# Patient Record
Sex: Male | Born: 1945
Health system: Southern US, Community
[De-identification: ages and names within clinical notes are randomized; demographics above are authoritative.]

## PROBLEM LIST (undated history)

## (undated) DIAGNOSIS — G20A1 Parkinson's disease without dyskinesia, without mention of fluctuations: Secondary | ICD-10-CM

## (undated) DIAGNOSIS — R112 Nausea with vomiting, unspecified: Secondary | ICD-10-CM

## (undated) DIAGNOSIS — J302 Other seasonal allergic rhinitis: Secondary | ICD-10-CM

## (undated) DIAGNOSIS — Z9889 Other specified postprocedural states: Secondary | ICD-10-CM

## (undated) DIAGNOSIS — M1990 Unspecified osteoarthritis, unspecified site: Secondary | ICD-10-CM

## (undated) DIAGNOSIS — K219 Gastro-esophageal reflux disease without esophagitis: Secondary | ICD-10-CM

## (undated) DIAGNOSIS — I517 Cardiomegaly: Secondary | ICD-10-CM

## (undated) DIAGNOSIS — I2510 Atherosclerotic heart disease of native coronary artery without angina pectoris: Secondary | ICD-10-CM

## (undated) DIAGNOSIS — G709 Myoneural disorder, unspecified: Secondary | ICD-10-CM

## (undated) DIAGNOSIS — R011 Cardiac murmur, unspecified: Secondary | ICD-10-CM

## (undated) DIAGNOSIS — E785 Hyperlipidemia, unspecified: Secondary | ICD-10-CM

## (undated) DIAGNOSIS — F32A Depression, unspecified: Secondary | ICD-10-CM

## (undated) DIAGNOSIS — I10 Essential (primary) hypertension: Secondary | ICD-10-CM

## (undated) DIAGNOSIS — Z952 Presence of prosthetic heart valve: Secondary | ICD-10-CM

## (undated) HISTORY — DX: Cardiomegaly: I51.7

## (undated) HISTORY — DX: Parkinson's disease without dyskinesia, without mention of fluctuations: G20.A1

## (undated) HISTORY — DX: Other seasonal allergic rhinitis: J30.2

## (undated) HISTORY — DX: Essential (primary) hypertension: I10

## (undated) HISTORY — PX: TONSILLECTOMY: SUR1361

## (undated) HISTORY — DX: Gastro-esophageal reflux disease without esophagitis: K21.9

## (undated) HISTORY — PX: APPENDECTOMY: SHX54

## (undated) HISTORY — PX: CARDIAC CATHETERIZATION: SHX172

## (undated) HISTORY — DX: Hyperlipidemia, unspecified: E78.5

## (undated) HISTORY — DX: Presence of prosthetic heart valve: Z95.2

## (undated) HISTORY — PX: HIATAL HERNIA REPAIR: SHX195

## (undated) HISTORY — PX: COLONOSCOPY: SHX174

---

## 2001-01-10 ENCOUNTER — Ambulatory Visit (HOSPITAL_COMMUNITY): Admission: RE | Admit: 2001-01-10 | Discharge: 2001-01-10 | Payer: Self-pay | Admitting: Gastroenterology

## 2001-01-10 ENCOUNTER — Encounter: Payer: Self-pay | Admitting: Gastroenterology

## 2001-02-01 ENCOUNTER — Ambulatory Visit (HOSPITAL_COMMUNITY): Admission: RE | Admit: 2001-02-01 | Discharge: 2001-02-01 | Payer: Self-pay | Admitting: Gastroenterology

## 2010-12-17 ENCOUNTER — Encounter: Payer: Self-pay | Admitting: Internal Medicine

## 2011-02-11 ENCOUNTER — Ambulatory Visit (AMBULATORY_SURGERY_CENTER): Payer: MEDICARE | Admitting: *Deleted

## 2011-02-11 VITALS — Ht 69.0 in | Wt 185.8 lb

## 2011-02-11 DIAGNOSIS — Z1211 Encounter for screening for malignant neoplasm of colon: Secondary | ICD-10-CM

## 2011-02-11 MED ORDER — PEG-KCL-NACL-NASULF-NA ASC-C 100 G PO SOLR: ORAL | Status: DC

## 2011-02-25 ENCOUNTER — Other Ambulatory Visit: Payer: Self-pay | Admitting: Internal Medicine

## 2011-03-10 ENCOUNTER — Telehealth: Payer: Self-pay | Admitting: *Deleted

## 2011-03-10 NOTE — Telephone Encounter (Signed)
Spoke with pt's wife to ask if pt can r/s his COLON from 11/05/04/10 to 04/21/11. We need the November appt for an pt who needs an earlier appt and his COLON is for screening. Wife states as far as she knows it's ok; she will call me back this pm and let me know.

## 2011-03-15 ENCOUNTER — Encounter: Payer: MEDICARE | Admitting: Internal Medicine

## 2011-03-17 NOTE — Telephone Encounter (Signed)
Closed; never got a return call.

## 2011-04-21 ENCOUNTER — Encounter: Payer: Self-pay | Admitting: Internal Medicine

## 2011-04-21 ENCOUNTER — Ambulatory Visit (AMBULATORY_SURGERY_CENTER): Payer: MEDICARE | Admitting: Internal Medicine

## 2011-04-21 VITALS — BP 137/77 | HR 65 | Temp 97.7°F | Resp 21 | Ht 69.0 in | Wt 185.0 lb

## 2011-04-21 DIAGNOSIS — Z1211 Encounter for screening for malignant neoplasm of colon: Secondary | ICD-10-CM

## 2011-04-21 MED ORDER — SODIUM CHLORIDE 0.9 % IV SOLN: 500.0000 mL | INTRAVENOUS | Status: DC

## 2011-04-21 NOTE — Op Note (Signed)
[facility] [address] [place], [place]  [address]  COLONOSCOPY PROCEDURE REPORT  PATIENT:  Drew Andrews, Drew Andrews  MR#:  [id number] BIRTHDATE:  03/27/1946, 65 yrs. old  GENDER:  male ENDOSCOPIST:  Carie Caddy. Mayrani Khamis, MD REF. BY:  Guerry Bruin, M.D. PROCEDURE DATE:  04/21/2011 PROCEDURE:  Colonoscopy 19147 ASA CLASS:  Class II INDICATIONS:  Routine Risk Screening MEDICATIONS:   MAC sedation, administered by CRNA, propofol (Diprivan) 280 mg IV  DESCRIPTION OF PROCEDURE:   After the risks benefits and alternatives of the procedure were thoroughly explained, informed consent was obtained.  Digital rectal exam was performed and revealed no rectal masses.   The LB CF-[id number] endoscope was introduced through the anus and advanced to the terminal ileum which was intubated for a short distance, without limitations. The quality of the prep was good, using MoviPrep.  The instrument was then slowly withdrawn as the colon was fully examined. <<PROCEDUREIMAGES>>  FINDINGS:  Normal terminal ileum.  Moderate diverticulosis was found DESCENDING & SIGMOID COLON. Small internal hemorrhoids were found.   Retroflexed views in the rectum revealed no other findings other than those already described.  The scope was then withdrawn from the cecum and the procedure completed.  COMPLICATIONS:  None  ENDOSCOPIC IMPRESSION: 1) Normal terminal ileum 2) Moderate diverticulosis in the DESCENDING & SIGMOID COLON 3) Internal hemorrhoids  RECOMMENDATIONS: 1) High fiber diet. 2) You should continue to follow colorectal cancer screening guidelines for "routine risk" patients with a repeat colonoscopy in 10 years. There is no need for FOBT (stool) testing for at least 5 years.  Carie Caddy. Rhea Belton, MD  CC:  Guerry Bruin, MD The Patient  n. eSIGNED:   Carie Caddy. Ivonna Kinnick at 04/21/2011 08:56 AM  Iverson Alamin, [phone]

## 2011-04-21 NOTE — Patient Instructions (Signed)
Please follow all discharge instructions given to you by the recovery room nurse. If you have any questions or problems after discharge please call [phone]. You will receive a phone call in the am to see how you are doing and answer any questions you may have. Thank you for choosing [facility] for your health care needs.  

## 2011-04-21 NOTE — Progress Notes (Signed)
Patient did not experience any of the following events: a burn prior to discharge; a fall within the facility; wrong site/side/patient/procedure/implant event; or a hospital transfer or hospital admission upon discharge from the facility. (G8907) Patient did not have preoperative order for IV antibiotic SSI prophylaxis. (G8918)  

## 2011-04-22 ENCOUNTER — Telehealth: Payer: Self-pay | Admitting: *Deleted

## 2011-04-22 NOTE — Telephone Encounter (Signed)

## 2012-04-16 ENCOUNTER — Other Ambulatory Visit (HOSPITAL_COMMUNITY): Payer: Self-pay | Admitting: Cardiovascular Disease

## 2012-04-16 DIAGNOSIS — I359 Nonrheumatic aortic valve disorder, unspecified: Secondary | ICD-10-CM

## 2012-07-05 ENCOUNTER — Ambulatory Visit (HOSPITAL_COMMUNITY)
Admission: RE | Admit: 2012-07-05 | Discharge: 2012-07-05 | Disposition: A | Discharge status: Home / Self Care | Payer: MEDICARE | Source: Ambulatory Visit | Attending: Cardiovascular Disease | Admitting: Cardiovascular Disease

## 2012-07-05 DIAGNOSIS — I359 Nonrheumatic aortic valve disorder, unspecified: Secondary | ICD-10-CM | POA: Insufficient documentation

## 2012-07-05 NOTE — Progress Notes (Signed)
[facility]   2D echo completed 07/05/2012.   Cindy Rigg, RDCS  

## 2013-02-14 ENCOUNTER — Other Ambulatory Visit (HOSPITAL_COMMUNITY): Payer: Self-pay | Admitting: Cardiovascular Disease

## 2013-02-14 DIAGNOSIS — I350 Nonrheumatic aortic (valve) stenosis: Secondary | ICD-10-CM

## 2013-03-04 ENCOUNTER — Ambulatory Visit (HOSPITAL_COMMUNITY)
Admission: RE | Admit: 2013-03-04 | Discharge: 2013-03-04 | Disposition: A | Discharge status: Home / Self Care | Payer: Medicare Other | Source: Ambulatory Visit | Attending: Cardiovascular Disease | Admitting: Cardiovascular Disease

## 2013-03-04 DIAGNOSIS — I350 Nonrheumatic aortic (valve) stenosis: Secondary | ICD-10-CM

## 2013-03-04 DIAGNOSIS — I359 Nonrheumatic aortic valve disorder, unspecified: Secondary | ICD-10-CM | POA: Insufficient documentation

## 2013-03-04 HISTORY — PX: TRANSTHORACIC ECHOCARDIOGRAM: SHX275

## 2013-03-04 NOTE — Progress Notes (Signed)
2D Echo Performed 05/14/2012    Irlanda Croghan, RCS  

## 2013-03-12 ENCOUNTER — Encounter (HOSPITAL_COMMUNITY): Payer: Self-pay | Admitting: Pharmacist

## 2013-03-12 ENCOUNTER — Encounter: Payer: Self-pay | Admitting: Cardiovascular Disease

## 2013-03-12 ENCOUNTER — Ambulatory Visit (INDEPENDENT_AMBULATORY_CARE_PROVIDER_SITE_OTHER): Payer: Medicare Other | Admitting: Cardiovascular Disease

## 2013-03-12 VITALS — BP 140/78 | HR 66 | Ht 69.5 in | Wt 185.6 lb

## 2013-03-12 DIAGNOSIS — I519 Heart disease, unspecified: Secondary | ICD-10-CM

## 2013-03-12 DIAGNOSIS — I350 Nonrheumatic aortic (valve) stenosis: Secondary | ICD-10-CM

## 2013-03-12 DIAGNOSIS — I5189 Other ill-defined heart diseases: Secondary | ICD-10-CM

## 2013-03-12 DIAGNOSIS — Z01818 Encounter for other preprocedural examination: Secondary | ICD-10-CM

## 2013-03-12 DIAGNOSIS — E785 Hyperlipidemia, unspecified: Secondary | ICD-10-CM

## 2013-03-12 DIAGNOSIS — I2510 Atherosclerotic heart disease of native coronary artery without angina pectoris: Secondary | ICD-10-CM

## 2013-03-12 DIAGNOSIS — I10 Essential (primary) hypertension: Secondary | ICD-10-CM

## 2013-03-12 DIAGNOSIS — I359 Nonrheumatic aortic valve disorder, unspecified: Secondary | ICD-10-CM

## 2013-03-12 DIAGNOSIS — D689 Coagulation defect, unspecified: Secondary | ICD-10-CM

## 2013-03-12 HISTORY — DX: Essential (primary) hypertension: I10

## 2013-03-12 HISTORY — DX: Nonrheumatic aortic (valve) stenosis: I35.0

## 2013-03-12 HISTORY — DX: Other ill-defined heart diseases: I51.89

## 2013-03-12 NOTE — Progress Notes (Signed)
Patient ID: Drew Andrews, male   DOB: November 28, 1945, 67 y.o.   MRN: [id number]     HPI: Drew Andrews is a 66 y.o. male who presents for cardiology followup evaluation regarding his aortic valve stenosis.  Mr. Drew Andrews now 67 years old. He was first told of having a heart murmur back in the 1980s when he was planning to work at it it is only from [employer] and had a physical exam done at that time. I have been seeing him since 2005 when he was referred to me by Dr. Kinnie Andrews for cardiac murmur. Since 2005, we have been following him fairly closely with frequent followup echo Doppler studies to assess his aortic valve. At that time, his aortic transvalvular gradient was 37 with a mean gradient of 18. Over the last 9 years, his aortic valve murmur has gradually become more significant such that last year in September 2013 he had a mean gradient of 41 with a peak instantaneous gradient of 73. At that time he remained entirely asymptomatic and was exercising 7 days per week and typically riding his bicycle anywhere from 40 minutes to an hour and a half without abnormalities. A subsequent Doppler study in March 2014 showed moderate concentric LVH with grade 2 diastolic dysfunction; Mean gradient was 40 and peak gradient 71 and calculated valve area was 0.9 cm. Over the years I have informed him of any symptoms that can occur as a result of this aortic stenosis. He recently had a six-month followup echo Doppler assessment on 03/04/2013. Ejection fraction again remained 55-60%. He did have grade 2 diastolic dysfunction. His aortic valve again was moderately calcified. The stenosis is felt to be severe with mild aortic regurgitation. On his most recent echo, mean gradient is now 47 and peak instantaneous gradient is 86 giving a calculated aortic valve area of 0.63 cm. He did have mitral annular calcification. His left atrium was moderately dilated. He did have very mild RV dilatation.  Upon  questioning, it does appear that over the past month Mr. Drew Andrews has continued to exercise. He has noticed however that when he walks up a steep hill for bikes up a hill at a fast pace he now has begun to notice a vague sense of upper chest tightness which immediately resolves with stopping what he does. He is unaware of any dyspnea. He denies any PND orthopnea. He is unaware of presyncope or syncope. He presents now for evaluation  Past Medical History  Diagnosis Date  . Seasonal allergies   . GERD (gastroesophageal reflux disease)   . LVH (left ventricular hypertrophy)     with aortic stenosis-bicuspid  . Hyperlipidemia   . Hypertension     Past Surgical History  Procedure Laterality Date  . Appendectomy    . Tonsillectomy    . Hiatal hernia repair    . Colonoscopy      Allergies  Allergen Reactions  . Scopolamine     seizure    Current Outpatient Prescriptions  Medication Sig Dispense Refill  . aspirin 81 MG tablet Take 81 mg by mouth daily.        . Ibuprofen-Diphenhydramine Cit (ADVIL PM PO) Take by mouth at bedtime.        Drew Andrews lisinopril (PRINIVIL,ZESTRIL) 5 MG tablet Take 5 mg by mouth daily.       Drew Andrews omeprazole (PRILOSEC OTC) 20 MG tablet Take 20 mg by mouth daily.        Drew Andrews VYTORIN 10-40 MG per tablet Take  1 tablet by mouth at bedtime.        No current facility-administered medications for this visit.    History   Social History  . Marital Status: Married    Spouse Name: N/A    Number of Children: N/A  . Years of Education: N/A   Occupational History  . Not on file.   Social History Main Topics  . Smoking status: Former Smoker    Quit date: 02/10/1974  . Smokeless tobacco: Not on file  . Alcohol Use: Yes     Comment: Drinks 1 bottle of wine daily and 1 beer  . Drug Use: No  . Sexual Activity: Not on file   Other Topics Concern  . Not on file   Social History Narrative  . No narrative on file   Additional social history is notable in that he has  a [employer] in [employer]. He is retired from [employer]. He quit tobacco in 1979. He continues to exercise 7 days per week.  Family History  Problem Relation Age of Onset  . Colon cancer Neg Hx   . Stomach cancer Neg Hx   . Heart attack Father     ROS is negative for fevers, chills or night sweats. He denies any changes in skin or turgor. There is no hair loss. He denies visual symptoms. He denies cough. There is no excess sputum production. He denies PND or orthopnea. There is no wheezing. There is no presyncope or syncope. He is unaware of palpitations. With significant activity he does note mild chest tightness particularly on inclines. He denies abdominal pain nausea vomiting or diarrhea. He denies change in bowel or bladder habits. He denies GU symptoms. He denies myalgias. He denies paresthesias. There is no known diabetes or endocrine problems. There is no thyroid abnormalities. He denies change in mental status. He denies sleep issues.  Other comprehensive 14 point system review is negative.  PE BP 140/78  Pulse 66  Ht 5' 9.5" (1.765 m)  Wt 185 lb 9.6 oz (84.188 kg)  BMI 27.02 kg/m2  Repeat blood pressure was 138/76. There was no orthostatic change. General: Alert, oriented, no distress.  Skin: normal turgor, no rashes HEENT: Normocephalic, atraumatic. Pupils round and reactive; sclera anicteric;no lid lag.  Nose without nasal septal hypertrophy Mouth/Parynx benign; Mallinpatti scale 2 Neck: No JVD; transmitted murmurs to his carotids bilaterally Lungs: clear to ausculatation and percussion; no wheezing or rales No chest wall tenderness to palpation Heart: RRR, s1 s2 normal 3/6 mid to late peaking harsh systolic murmur corn with aortic valve stenosis. Trivial  murmur of aortic insufficiency. Abdomen: soft, nontender; no hepatosplenomehaly, BS+; abdominal aorta nontender and not dilated by palpation. Pulses 2+ Femoral pulses 2+ without  bruits. Extremities: no clubbing cyanosis or edema, Homan's sign negative  Neurologic: grossly nonfocal; normal motor strength Psychologic: normal affect and mood.  ECG: Sinus rhythm at 66 beats per minute. First-degree AV block. LVH by voltage criteria. Mild nondiagnostic T changes.  LABS:  BMET No results found for this basename: na, k, cl, co2, glucose, bun, creatinine, calcium, gfrnonaa, gfraa     Hepatic Function Panel  No results found for this basename: prot, albumin, ast, alt, alkphos, bilitot, bilidir, ibili     CBC No results found for this basename: wbc, rbc, hgb, hct, plt, mcv, mch, mchc, rdw, neutrabs, lymphsabs, monoabs, eosabs, basosabs     BNP No results found for this basename: probnp    Lipid Panel  No  results found for this basename: chol, trig, hdl, cholhdl, vldl, ldlcalc     RADIOLOGY: No results found.    ASSESSMENT AND PLAN: I had a very long discussion with Mr.Cerda and his wife. I reviewed his echo Doppler data. I discussed with him in detail normal physiology with reference to cardiac anatomy and specifically with reference to his aortic valve and progressive aortic valve stenosis. On his most recent echo Doppler study, Mr. in toto which is echo reveals left ventricular hypertrophy with grade 2 diastolic dysfunction with severe aortic stenosis now with a valve area of 0.63 cm and a peak instantaneous gradient of 86 mm as well as a mean gradient of 47 mm. He is developed very mild symptoms of chest tightness particularly during significant exertion with his bike riding uphills and walking fast uphills. It is my strong recommendation that we pursue cardiac catheterization with the intent for aortic valve replacement surgery. I discussed with him in detail the risks benefits of the catheterization procedure. He is otherwise fairly healthy without significant comorbidities and I suspect would do well with the open aortic valve surgical approach using  a bovine aortic valve rather than a mechanical valve. I discussed risks benefits in detail of both procedures. After long discussion he be scheduled to undergo right and left heart catheterization later this week at [facility]. Laboratory will be checked prior to the procedure in the fasting state. Following his catheterization findings, I will then refer him for surgical consultation to be done as an outpatient for aortic valve replacement surgery.   Time spent: 45 minutes  Lennette Bihari, MD, [facility]  03/12/2013 1:56 PM

## 2013-03-12 NOTE — Patient Instructions (Signed)
Your physician has requested that you have a cardiac catheterization. Cardiac catheterization is used to diagnose and/or treat various heart conditions. Doctors may recommend this procedure for a number of different reasons. The most common reason is to evaluate chest pain. Chest pain can be a symptom of coronary artery disease (CAD), and cardiac catheterization can show whether plaque is narrowing or blocking your heart's arteries. This procedure is also used to evaluate the valves, as well as measure the blood flow and oxygen levels in different parts of your heart. For further information please visit [email or web address] Please follow instruction sheet, as given. This will be scheduled on Friday November 14th.  A chest x-ray takes a picture of the organs and structures inside the chest, including the heart, lungs, and blood vessels. This test can show several things, including, whether the heart is enlarges; whether fluid is building up in the lungs; and whether pacemaker / defibrillator leads are still in place.  Your physician recommends that you return for lab work for your procedure.

## 2013-03-13 ENCOUNTER — Ambulatory Visit: Payer: MEDICARE | Admitting: Cardiovascular Disease

## 2013-03-13 ENCOUNTER — Ambulatory Visit
Admission: RE | Admit: 2013-03-13 | Discharge: 2013-03-13 | Disposition: A | Discharge status: Home / Self Care | Payer: Medicare Other | Source: Ambulatory Visit | Attending: Cardiovascular Disease | Admitting: Cardiovascular Disease

## 2013-03-13 ENCOUNTER — Other Ambulatory Visit: Payer: Self-pay | Admitting: *Deleted

## 2013-03-13 DIAGNOSIS — E559 Vitamin D deficiency, unspecified: Secondary | ICD-10-CM

## 2013-03-13 DIAGNOSIS — Z01818 Encounter for other preprocedural examination: Secondary | ICD-10-CM

## 2013-03-13 DIAGNOSIS — D689 Coagulation defect, unspecified: Secondary | ICD-10-CM

## 2013-03-13 LAB — CBC
HCT: 39.2 % (ref 39.0–52.0)
MCH: 34.8 pg — ABNORMAL HIGH (ref 26.0–34.0)
MCHC: 34.2 g/dL (ref 30.0–36.0)
Platelets: 152 10*3/uL (ref 150–400)
RBC: 3.85 MIL/uL — ABNORMAL LOW (ref 4.22–5.81)
RDW: 13.8 % (ref 11.5–15.5)
WBC: 6.2 10*3/uL (ref 4.0–10.5)

## 2013-03-13 LAB — COMPREHENSIVE METABOLIC PANEL
ALT: 18 U/L (ref 0–53)
Albumin: 4.6 g/dL (ref 3.5–5.2)
CO2: 26 mEq/L (ref 19–32)
Calcium: 9.3 mg/dL (ref 8.4–10.5)
Chloride: 102 mEq/L (ref 96–112)
Potassium: 4.6 mEq/L (ref 3.5–5.3)
Sodium: 133 mEq/L — ABNORMAL LOW (ref 135–145)
Total Protein: 7 g/dL (ref 6.0–8.3)

## 2013-03-13 LAB — APTT: aPTT: 29 seconds (ref 24–37)

## 2013-03-13 LAB — LIPID PANEL
HDL: 63 mg/dL (ref >39)
LDL Cholesterol: 90 mg/dL (ref 0–99)

## 2013-03-13 LAB — PROTIME-INR: INR: 0.99 (ref <1.50)

## 2013-03-15 ENCOUNTER — Ambulatory Visit (HOSPITAL_COMMUNITY)
Admission: RE | Admit: 2013-03-15 | Discharge: 2013-03-15 | Disposition: A | Discharge status: Home / Self Care | Payer: Medicare Other | Source: Ambulatory Visit | Attending: Cardiovascular Disease | Admitting: Cardiovascular Disease

## 2013-03-15 ENCOUNTER — Encounter (HOSPITAL_COMMUNITY)
Admission: RE | Disposition: A | Discharge status: Home / Self Care | Payer: Self-pay | Source: Ambulatory Visit | Attending: Cardiovascular Disease

## 2013-03-15 DIAGNOSIS — I359 Nonrheumatic aortic valve disorder, unspecified: Secondary | ICD-10-CM

## 2013-03-15 DIAGNOSIS — I279 Pulmonary heart disease, unspecified: Secondary | ICD-10-CM

## 2013-03-15 DIAGNOSIS — I10 Essential (primary) hypertension: Secondary | ICD-10-CM | POA: Insufficient documentation

## 2013-03-15 DIAGNOSIS — Z87891 Personal history of nicotine dependence: Secondary | ICD-10-CM | POA: Diagnosis not present

## 2013-03-15 DIAGNOSIS — Z01818 Encounter for other preprocedural examination: Secondary | ICD-10-CM

## 2013-03-15 DIAGNOSIS — E785 Hyperlipidemia, unspecified: Secondary | ICD-10-CM | POA: Insufficient documentation

## 2013-03-15 DIAGNOSIS — I517 Cardiomegaly: Secondary | ICD-10-CM | POA: Diagnosis not present

## 2013-03-15 DIAGNOSIS — I2789 Other specified pulmonary heart diseases: Secondary | ICD-10-CM | POA: Insufficient documentation

## 2013-03-15 DIAGNOSIS — I519 Heart disease, unspecified: Secondary | ICD-10-CM

## 2013-03-15 DIAGNOSIS — I350 Nonrheumatic aortic (valve) stenosis: Secondary | ICD-10-CM

## 2013-03-15 DIAGNOSIS — K219 Gastro-esophageal reflux disease without esophagitis: Secondary | ICD-10-CM | POA: Insufficient documentation

## 2013-03-15 DIAGNOSIS — I5189 Other ill-defined heart diseases: Secondary | ICD-10-CM

## 2013-03-15 HISTORY — PX: LEFT HEART CATHETERIZATION WITH CORONARY ANGIOGRAM: SHX5451

## 2013-03-15 LAB — POCT I-STAT 3, ART BLOOD GAS (G3+)
Acid-base deficit: 2 mmol/L (ref 0.0–2.0)
Bicarbonate: 20.9 mEq/L (ref 20.0–24.0)
O2 Saturation: 97 %
pCO2 arterial: 30 mmHg — ABNORMAL LOW (ref 35.0–45.0)

## 2013-03-15 LAB — POCT I-STAT 3, VENOUS BLOOD GAS (G3P V)
Acid-base deficit: 3 mmol/L — ABNORMAL HIGH (ref 0.0–2.0)
Bicarbonate: 21.8 mEq/L (ref 20.0–24.0)
pCO2, Ven: 37.2 mmHg — ABNORMAL LOW (ref 45.0–50.0)
pO2, Ven: 36 mmHg (ref 30.0–45.0)

## 2013-03-15 SURGERY — LEFT HEART CATHETERIZATION WITH CORONARY ANGIOGRAM
Anesthesia: LOCAL

## 2013-03-15 MED ORDER — NITROGLYCERIN 0.2 MG/ML ON CALL CATH LAB
INTRAVENOUS | Status: AC
  Filled 2013-03-15: qty 1

## 2013-03-15 MED ORDER — ACETAMINOPHEN 325 MG PO TABS: 650.0000 mg | ORAL_TABLET | ORAL | Status: DC | PRN

## 2013-03-15 MED ORDER — HEPARIN (PORCINE) IN NACL 2-0.9 UNIT/ML-% IJ SOLN
INTRAMUSCULAR | Status: AC
  Filled 2013-03-15: qty 1000

## 2013-03-15 MED ORDER — MIDAZOLAM HCL 2 MG/2ML IJ SOLN
INTRAMUSCULAR | Status: AC
  Filled 2013-03-15: qty 2

## 2013-03-15 MED ORDER — DEXTROSE-NACL 5-0.45 % IV SOLN
INTRAVENOUS | Status: DC
  Administered 2013-03-15: 09:00:00 via INTRAVENOUS
  Filled 2013-03-15: qty 1000

## 2013-03-15 MED ORDER — ASPIRIN 81 MG PO CHEW
81.0000 mg | CHEWABLE_TABLET | ORAL | Status: AC
  Administered 2013-03-15: 81 mg via ORAL
  Filled 2013-03-15: qty 1

## 2013-03-15 MED ORDER — SODIUM CHLORIDE 0.9 % IV SOLN: 250.0000 mL | INTRAVENOUS | Status: DC | PRN

## 2013-03-15 MED ORDER — DIAZEPAM 5 MG PO TABS
5.0000 mg | ORAL_TABLET | ORAL | Status: AC
  Administered 2013-03-15: 5 mg via ORAL
  Filled 2013-03-15: qty 1

## 2013-03-15 MED ORDER — SODIUM CHLORIDE 0.9 % IJ SOLN: 3.0000 mL | Freq: Two times a day (BID) | INTRAMUSCULAR | Status: DC

## 2013-03-15 MED ORDER — SODIUM CHLORIDE 0.9 % IJ SOLN: 3.0000 mL | INTRAMUSCULAR | Status: DC | PRN

## 2013-03-15 MED ORDER — FENTANYL CITRATE 0.05 MG/ML IJ SOLN
INTRAMUSCULAR | Status: AC
  Filled 2013-03-15: qty 2

## 2013-03-15 MED ORDER — SODIUM CHLORIDE 0.9 % IV SOLN: INTRAVENOUS | Status: DC

## 2013-03-15 MED ORDER — LIDOCAINE HCL (PF) 1 % IJ SOLN
INTRAMUSCULAR | Status: AC
  Filled 2013-03-15: qty 30

## 2013-03-15 MED ORDER — ONDANSETRON HCL 4 MG/2ML IJ SOLN: 4.0000 mg | Freq: Four times a day (QID) | INTRAMUSCULAR | Status: DC | PRN

## 2013-03-15 NOTE — Interval H&P Note (Signed)
History and Physical Interval Note:  03/15/2013 8:29 AM  Drew Andrews  has presented today for R and L heart catheterization, with the diagnosis of Aortic Valve stenosis/chest pain.   The various methods of treatment have been discussed with the patient and family. After consideration of risks, benefits and other options for treatment, the patient has consented to  Procedure(s): LEFT HEART CATHETERIZATION WITH CORONARY ANGIOGRAM (N/A) as a surgical intervention .  The patient's history has been reviewed, patient examined, no change in status, stable for surgery.  I have reviewed the patient's chart and labs.  Questions were answered to the patient's satisfaction.     KELLY,THOMAS A

## 2013-03-15 NOTE — H&P (View-Only) (Signed)
Patient ID: Drew Andrews, male   DOB: 11/08/1945, 67 y.o.   MRN: [id number]     HPI: Drew Andrews is a 67 y.o. male who presents for cardiology followup evaluation regarding his aortic valve stenosis.  Mr. Drew Andrews now 67 years old. He was first told of having a heart murmur back in the 1980s when he was planning to work at it it is only from [employer] and had a physical exam done at that time. I have been seeing him since 2005 when he was referred to me by Dr. Medoff for cardiac murmur. Since 2005, we have been following him fairly closely with frequent followup echo Doppler studies to assess his aortic valve. At that time, his aortic transvalvular gradient was 37 with a mean gradient of 18. Over the last 9 years, his aortic valve murmur has gradually become more significant such that last year in September 2013 he had a mean gradient of 41 with a peak instantaneous gradient of 73. At that time he remained entirely asymptomatic and was exercising 7 days per week and typically riding his bicycle anywhere from 40 minutes to an hour and a half without abnormalities. A subsequent Doppler study in March 2014 showed moderate concentric LVH with grade 2 diastolic dysfunction; Mean gradient was 40 and peak gradient 71 and calculated valve area was 0.9 cm. Over the years I have informed him of any symptoms that can occur as a result of this aortic stenosis. He recently had a six-month followup echo Doppler assessment on 03/04/2013. Ejection fraction again remained 55-60%. He did have grade 2 diastolic dysfunction. His aortic valve again was moderately calcified. The stenosis is felt to be severe with mild aortic regurgitation. On his most recent echo, mean gradient is now 47 and peak instantaneous gradient is 86 giving a calculated aortic valve area of 0.63 cm. He did have mitral annular calcification. His left atrium was moderately dilated. He did have very mild RV dilatation.  Upon  questioning, it does appear that over the past month Drew Andrews has continued to exercise. He has noticed however that when he walks up a steep hill for bikes up a hill at a fast pace he now has begun to notice a vague sense of upper chest tightness which immediately resolves with stopping what he does. He is unaware of any dyspnea. He denies any PND orthopnea. He is unaware of presyncope or syncope. He presents now for evaluation  Past Medical History  Diagnosis Date  . Seasonal allergies   . GERD (gastroesophageal reflux disease)   . LVH (left ventricular hypertrophy)     with aortic stenosis-bicuspid  . Hyperlipidemia   . Hypertension     Past Surgical History  Procedure Laterality Date  . Appendectomy    . Tonsillectomy    . Hiatal hernia repair    . Colonoscopy      Allergies  Allergen Reactions  . Scopolamine     seizure    Current Outpatient Prescriptions  Medication Sig Dispense Refill  . aspirin 81 MG tablet Take 81 mg by mouth daily.        . Ibuprofen-Diphenhydramine Cit (ADVIL PM PO) Take by mouth at bedtime.        . lisinopril (PRINIVIL,ZESTRIL) 5 MG tablet Take 5 mg by mouth daily.       . omeprazole (PRILOSEC OTC) 20 MG tablet Take 20 mg by mouth daily.        . VYTORIN 10-40 MG per tablet Take   1 tablet by mouth at bedtime.        No current facility-administered medications for this visit.    History   Social History  . Marital Status: Married    Spouse Name: N/A    Number of Children: N/A  . Years of Education: N/A   Occupational History  . Not on file.   Social History Main Topics  . Smoking status: Former Smoker    Quit date: 02/10/1974  . Smokeless tobacco: Not on file  . Alcohol Use: Yes     Comment: Drinks 1 bottle of wine daily and 1 beer  . Drug Use: No  . Sexual Activity: Not on file   Other Topics Concern  . Not on file   Social History Narrative  . No narrative on file   Additional social history is notable in that he has  a bachelor of science in chemical engineering. He is retired from [employer]. He quit tobacco in 1979. He continues to exercise 7 days per week.  Family History  Problem Relation Age of Onset  . Colon cancer Neg Hx   . Stomach cancer Neg Hx   . Heart attack Father     ROS is negative for fevers, chills or night sweats. He denies any changes in skin or turgor. There is no hair loss. He denies visual symptoms. He denies cough. There is no excess sputum production. He denies PND or orthopnea. There is no wheezing. There is no presyncope or syncope. He is unaware of palpitations. With significant activity he does note mild chest tightness particularly on inclines. He denies abdominal pain nausea vomiting or diarrhea. He denies change in bowel or bladder habits. He denies GU symptoms. He denies myalgias. He denies paresthesias. There is no known diabetes or endocrine problems. There is no thyroid abnormalities. He denies change in mental status. He denies sleep issues.  Other comprehensive 14 point system review is negative.  PE BP 140/78  Pulse 66  Ht 5' 9.5" (1.765 m)  Wt 185 lb 9.6 oz (84.188 kg)  BMI 27.02 kg/m2  Repeat blood pressure was 138/76. There was no orthostatic change. General: Alert, oriented, no distress.  Skin: normal turgor, no rashes HEENT: Normocephalic, atraumatic. Pupils round and reactive; sclera anicteric;no lid lag.  Nose without nasal septal hypertrophy Mouth/Parynx benign; Mallinpatti scale 2 Neck: No JVD; transmitted murmurs to his carotids bilaterally Lungs: clear to ausculatation and percussion; no wheezing or rales No chest wall tenderness to palpation Heart: RRR, s1 s2 normal 3/6 mid to late peaking harsh systolic murmur corn with aortic valve stenosis. Trivial  murmur of aortic insufficiency. Abdomen: soft, nontender; no hepatosplenomehaly, BS+; abdominal aorta nontender and not dilated by palpation. Pulses 2+ Femoral pulses 2+ without  bruits. Extremities: no clubbing cyanosis or edema, Homan's sign negative  Neurologic: grossly nonfocal; normal motor strength Psychologic: normal affect and mood.  ECG: Sinus rhythm at 66 beats per minute. First-degree AV block. LVH by voltage criteria. Mild nondiagnostic T changes.  LABS:  BMET No results found for this basename: na, k, cl, co2, glucose, bun, creatinine, calcium, gfrnonaa, gfraa     Hepatic Function Panel  No results found for this basename: prot, albumin, ast, alt, alkphos, bilitot, bilidir, ibili     CBC No results found for this basename: wbc, rbc, hgb, hct, plt, mcv, mch, mchc, rdw, neutrabs, lymphsabs, monoabs, eosabs, basosabs     BNP No results found for this basename: probnp    Lipid Panel  No   results found for this basename: chol, trig, hdl, cholhdl, vldl, ldlcalc     RADIOLOGY: No results found.    ASSESSMENT AND PLAN: I had a very long discussion with Drew Andrews and his wife. I reviewed his echo Doppler data. I discussed with him in detail normal physiology with reference to cardiac anatomy and specifically with reference to his aortic valve and progressive aortic valve stenosis. On his most recent echo Doppler study, Mr. in toto which is echo reveals left ventricular hypertrophy with grade 2 diastolic dysfunction with severe aortic stenosis now with a valve area of 0.63 cm and a peak instantaneous gradient of 86 mm as well as a mean gradient of 47 mm. He is developed very mild symptoms of chest tightness particularly during significant exertion with his bike riding uphills and walking fast uphills. It is my strong recommendation that we pursue cardiac catheterization with the intent for aortic valve replacement surgery. I discussed with him in detail the risks benefits of the catheterization procedure. He is otherwise fairly healthy without significant comorbidities and I suspect would do well with the open aortic valve surgical approach using  a bovine aortic valve rather than a mechanical valve. I discussed risks benefits in detail of both procedures. After long discussion he be scheduled to undergo right and left heart catheterization later this week at [facility]. Laboratory will be checked prior to the procedure in the fasting state. Following his catheterization findings, I will then refer him for surgical consultation to be done as an outpatient for aortic valve replacement surgery.   Time spent: 45 minutes  Catera Hankins A. Mauri Temkin, MD, FACC  03/12/2013 1:56 PM    

## 2013-03-15 NOTE — CV Procedure (Addendum)
Drew Andrews is a 67 y.o. male    [id number]  [id number] LOCATION:  FACILITY: [facility]  PHYSICIAN: Lennette Bihari, MD, [facility] 02-11-1946   DATE OF PROCEDURE:  03/15/2013      CARDIAC CATHETERIZATION     HISTORY:  Mr. Drew Andrews is a 67 year old gentleman who has developed progressive aortic valve stenosis. He has remained essentially asymptomatic until approximately the past month when he has noticed a mild chest discomfort with significant activity. A recent echo Doppler study has demonstrated progression of his aortic stenosis. Ejection fraction was 55-60% , but he had grade 2 diastolic dysfunction. He had moderately severe aortic valve calcification with a mean transvalvular aortic gradient of 47 mm and a peak instantaneous gradient of 86 mm an aortic valve area of 0.63 cm. He had mitral annular calcification, moderate left atrial dilatation, and very mild RV dilatation. He now presents for right and left heart catheterization prior to need for aortic valve replacement surgery.   PROCEDURE:  The patient was brought to the second floor [facility]iac cath lab in the postabsorptive state. He was  premedicated with Versed 2 mg and fentanyl 50 mcg intravenously. He did receive an additional 1 mg Versed. His right femoral artery and right femoral vein were punctured and a 7 French venous sheath and 6 French arterial sheath were inserted. Swan-Ganz catheter was advanced in the venous sheath to the right atrium, right ventricle, pulmonary artery, pulmonary capillary wedge positions. Cardiac Was obtained by the thermodilution method. Oxygen saturation was obtained in the pulmonary artery and subsequently into the central aorta for Fick method cardiac output determination. The pigtail catheter was advanced into the central aorta. Simultaneous central aortic and pulmonary pressures were recorded. Initial attempt was made at crossing the aortic valve a straight wire but this was  unsuccessful. The pigtail catheter remains in the central aorta and supravalvular aortography was performed. The pigtail catheter was then removed and exchanged for a 5 French L4 diagnostic catheter and angiography was performed in the left coronary system. A JR 4 right catheter was then inserted for selective angiography into the right coronary artery. The right catheter was then used and a straight wire was inserted for further attempts at crossing the aortic valve. A Glidewire was also utilized, unsuccessfully. The catheter was then inserted and again the aortic valve was unable to be crossed. A subsequent attempt was made with the pigtail catheter successfully. At this point, decision was made to abort further attempts at crossing the aortic valve. All catheters were removed. Hemostasis was obtained by direct manual pressure. The patient tolerated the procedure well appearing  HEMODYNAMICS:   RA: 11/10  Mean 9 RV: 38/5/9 PA: 38/17 PCWP: a 20;  Mean 17  Oxygen Saturation:  PA: 68%                                  AO: 97%  Cardiac Output: 5.6l/min (Thermo); 5.0 (Fick)                Index:   2.8 l/min/m2;          2.5   ANGIOGRAPHY:  Fluoroscopy revealed severe calcification of his aortic valve.   Supravalvular aortography demonstrated markedly reduced aortic valve excursion with trivial aortic insufficiency.  1. Left main:  Angiographically normal short vessel which bifurcated into the left anterior descending artery and a large dominant left circumflex coronary artery 2. LAD: Mild  proximal coronary calcification. Mild 20% smooth narrowing proximally after the first diagonal vessel. The LAD wrapped around the LV apex and was otherwise free of significant disease. 3. Left circumflex: Angiographically normal dominant vessel 4. Right coronary artery: Angiographically normal nondominant right coronary artery   IMPRESSION:  Mild Pulmonary hypertension  No significant coronary objective  disease with evidence for mild coronary calcification of the LAD with smooth 20% narrowing but otherwise angiographically normal vessels with a dominant left circumflex system  Severely calcified aortic valve with reduced excursion and trivial aortic insufficiency  Echocardiographic documentation of severe aortic valve stenosis with a mean gradient of 47, a peak instantaneous gradient of 86, and a calculated aortic valve area by echocardiography of 0.63 cm  RECOMMENDATION:  Aortic valve replacement surgery.    Lennette Bihari, MD, [facility] 03/15/2013 10:43 AM

## 2013-03-20 ENCOUNTER — Encounter: Payer: Self-pay | Admitting: Surgery

## 2013-03-20 ENCOUNTER — Institutional Professional Consult (permissible substitution) (INDEPENDENT_AMBULATORY_CARE_PROVIDER_SITE_OTHER): Payer: Medicare Other | Admitting: Surgery

## 2013-03-20 VITALS — BP 159/88 | HR 66 | Resp 16 | Ht 69.5 in | Wt 180.0 lb

## 2013-03-20 DIAGNOSIS — I350 Nonrheumatic aortic (valve) stenosis: Secondary | ICD-10-CM

## 2013-03-20 DIAGNOSIS — I359 Nonrheumatic aortic valve disorder, unspecified: Secondary | ICD-10-CM

## 2013-03-21 ENCOUNTER — Encounter: Payer: Self-pay | Admitting: Surgery

## 2013-03-21 NOTE — Progress Notes (Signed)
PCP is Gaspar Garbe, MD Referring Provider is Lennette Bihari, MD  Chief Complaint  Patient presents with  . Coronary Artery Disease    eval for surgery...cathed 03/15/13...ECHO 03/04/13  . Aortic Stenosis    HPI:  The patient is a 67 year old active gentleman with a long history of a heart murmur dating back to the 1980's and know aortic stenosis that has been followed by Dr. Tresa Endo with serial echocardiograms. Over the past 9 years the aortic stenosis has progressed and in Sept. 2013 the mean gradient was 41 mm Hg. He remained asymptomatic, exercising hard 7 days per week. His most recent echo shows severe AS with a mean gradient of 47 mm Hg and a peak gradient of 86 mm Hg with an AVA of 0.63 cm2. Over the past month or so he reports some discomfort across his upper chest when riding his bike up a hill. This resolves with rest. He denies unusual dyspnea or dizziness. He has had no symptoms with activity on level ground. Cardiac cath shows no significant coronary disease.  Past Medical History  Diagnosis Date  . Seasonal allergies   . GERD (gastroesophageal reflux disease)   . LVH (left ventricular hypertrophy)     with aortic stenosis-bicuspid  . Hyperlipidemia   . Hypertension     Past Surgical History  Procedure Laterality Date  . Appendectomy    . Tonsillectomy    . Hiatal hernia repair    . Colonoscopy      X 2    Family History  Problem Relation Age of Onset  . Colon cancer Neg Hx   . Stomach cancer Neg Hx   . Heart attack Father     Social History History  Substance Use Topics  . Smoking status: Former Smoker -- 1.00 packs/day    Quit date: 02/10/1974  . Smokeless tobacco: Never Used  . Alcohol Use: Yes     Comment: Drinks 1 bottle of wine daily and 1 beer    Current Outpatient Prescriptions  Medication Sig Dispense Refill  . aspirin EC 81 MG tablet Take 81 mg by mouth daily.      . Ibuprofen-Diphenhydramine Cit (ADVIL PM PO) Take 2-3 tablets by  mouth at bedtime.       [name] lisinopril (PRINIVIL,ZESTRIL) 5 MG tablet Take 5 mg by mouth at bedtime.       [name] omeprazole (PRILOSEC OTC) 20 MG tablet Take 20 mg by mouth daily.       [name] VYTORIN 10-40 MG per tablet Take 1 tablet by mouth at bedtime.        No current facility-administered medications for this visit.    Allergies  Allergen Reactions  . Scopolamine     seizure    Review of Systems  Constitutional: Negative for activity change, appetite change, fatigue and unexpected weight change.  HENT: Negative.        Sees his dentist regularly  Eyes: Negative.   Respiratory: Negative.   Cardiovascular: Positive for chest pain.       Across upper chest only whle riding his bike fast up hill  Gastrointestinal: Negative.   Endocrine: Negative.   Genitourinary: Negative.   Musculoskeletal: Negative.   Skin: Negative.   Allergic/Immunologic: Negative.   Neurological: Negative.   Hematological: Negative.   Psychiatric/Behavioral: Negative.     BP 159/88  Pulse 66  Resp 16  Ht 5' 9.5" (1.765 m)  Wt 180 lb (81.647 kg)  BMI 26.21  kg/m2  SpO2 99% Physical Exam  Constitutional: He is oriented to person, place, and time. He appears well-developed and well-nourished. No distress.  HENT:  Head: Normocephalic and atraumatic.  Mouth/Throat: Oropharynx is clear and moist.  Eyes: Conjunctivae and EOM are normal. Pupils are equal, round, and reactive to light.  Neck: Normal range of motion. Neck supple. No JVD present. No thyromegaly present.  Cardiovascular: Normal rate, regular rhythm and intact distal pulses.   Murmur heard. 3/6 harsh crescendo/decrescendo murmur heard throughout the precordium.  Pulmonary/Chest: Effort normal and breath sounds normal. No respiratory distress. He has no rales.  Abdominal: Soft. Bowel sounds are normal. He exhibits no distension and no mass. There is no tenderness.  Musculoskeletal: Normal range of motion. He exhibits no edema.  Lymphadenopathy:     He has no cervical adenopathy.  Neurological: He is alert and oriented to person, place, and time. No cranial nerve deficit.  Skin: Skin is warm and dry.  Psychiatric: He has a normal mood and affect.     Diagnostic Tests:  *Cardiovascular Imaging at [facility] [address] [place], [place] [address] [phone]  ------------------------------------------------------------ Transthoracic Echocardiography  Patient: Drew Andrews, Drew Andrews MR #: [id number] Study Date: 03/04/2013 Gender: M Age: 68 Height: 177.8cm Weight: 81.6kg BSA: 48m^2 Pt. Status: Room:  ORDERING Arley Phenix ATTENDING Nanetta Batty, MD REFERRING Tisovec, Adelfa Koh SONOGRAPHER Clearence Ped, RCS PERFORMING [facility] cc:  ------------------------------------------------------------ LV EF: 55% - 60%  ------------------------------------------------------------ Indications: 424.1 Aortic valve disorders.  ------------------------------------------------------------ History: PMH: Hyperlipidemia Risk factors: Hypertension.  ------------------------------------------------------------ Study Conclusions  - Left ventricle: The cavity size was normal. There was mild concentric hypertrophy. Systolic function was normal. The estimated ejection fraction was in the range of 55% to 60%. Wall motion was normal; there were no regional wall motion abnormalities. Features are consistent with a pseudonormal left ventricular filling pattern, with concomitant abnormal relaxation and increased filling pressure (grade 2 diastolic dysfunction). - Aortic valve: Possibly bicuspid; moderately calcified leaflets. There was severe stenosis. Mild regurgitation. Valve area: 0.66cm^2(VTI). Valve area: 0.63cm^2 (Vmax). - Mitral valve: Calcified annulus. Valve area by continuity equation (using LVOT flow): 1.62cm^2. - Left atrium: The atrium was moderately dilated. - Right ventricle: The  cavity size was mildly dilated. Wall thickness was normal. - Atrial septum: No defect or patent foramen ovale was identified. Transthoracic echocardiography. M-mode, complete 2D, spectral Doppler, and color Doppler. Height: Height: 177.8cm. Height: 70in. Weight: Weight: 81.6kg. Weight: 179.6lb. Body mass index: BMI: 25.8kg/m^2. Body surface area: BSA: 76m^2. Blood pressure: 164/78. Patient status: Outpatient. Location: Echo laboratory.  ------------------------------------------------------------  ------------------------------------------------------------ Left ventricle: The cavity size was normal. There was mild concentric hypertrophy. Systolic function was normal. The estimated ejection fraction was in the range of 55% to 60%. Wall motion was normal; there were no regional wall motion abnormalities. The transmitral flow pattern was normal. The tissue Doppler parameters were abnormal. Features are consistent with a pseudonormal left ventricular filling pattern, with concomitant abnormal relaxation and increased filling pressure (grade 2 diastolic dysfunction).  ------------------------------------------------------------ Aortic valve: Possibly bicuspid; moderately calcified leaflets. Doppler: There was severe stenosis. Mild regurgitation. VTI ratio of LVOT to aortic valve: 0.21. Valve area: 0.66cm^2(VTI). Indexed valve area: 0.33cm^2/m^2 (VTI). Peak velocity ratio of LVOT to aortic valve: 0.2. Valve area: 0.63cm^2 (Vmax). Indexed valve area: 0.32cm^2/m^2 (Vmax). Mean gradient: 47mm Hg (S). Peak gradient: 86mm Hg (S).  ------------------------------------------------------------ Aorta: The aorta was normal, not dilated, and non-diseased.  ------------------------------------------------------------ Mitral valve: Calcified annulus. Doppler: Valve area by pressure half-time: 3.06cm^2.  Indexed valve area by pressure half-time: 1.53cm^2/m^2. Valve area by continuity equation  (using LVOT flow): 1.62cm^2. Indexed valve area by continuity equation (using LVOT flow): 0.81cm^2/m^2. Mean gradient: 3mm Hg (D). Peak gradient: 10mm Hg (D).  ------------------------------------------------------------ Left atrium: LA volume/ BSA = 35.5 ml/m2 The atrium was moderately dilated.  ------------------------------------------------------------ Atrial septum: No defect or patent foramen ovale was identified.  ------------------------------------------------------------ Right ventricle: The cavity size was mildly dilated. Wall thickness was normal. Systolic function was normal.  ------------------------------------------------------------ Pulmonic valve: The valve appears to be grossly normal. Doppler: Mild regurgitation.  ------------------------------------------------------------ Tricuspid valve: Structurally normal valve. Leaflet separation was normal. Doppler: Transvalvular velocity was within the normal range. Trivial regurgitation.  ------------------------------------------------------------ Right atrium: The atrium was normal in size.  ------------------------------------------------------------ Pericardium: There was no pericardial effusion.  ------------------------------------------------------------ Systemic veins: Inferior vena cava: The vessel was normal in size; the respirophasic diameter changes were in the normal range (= 50%); findings are consistent with normal central venous pressure. Diameter: 16mm.  ------------------------------------------------------------ Post procedure conclusions Ascending Aorta:  - The aorta was normal, not dilated, and non-diseased.  ------------------------------------------------------------  2D measurements Normal Doppler measurements Norma IVC l Diam 16 mm ------ Main pulmonary artery Left ventricle Pressure, 21 mm Hg =30 LVID ED, 48.5 mm 43-52 S chord, Left ventricle PLAX Ea, lat 6.5 cm/s ----- LVID  ES, 32.5 mm 23-38 ann, tiss chord, DP PLAX E/Ea, lat 18.62 ----- FS, chord, 33 % >29 ann, tiss PLAX DP LVPW, ED 14.2 mm ------ Ea, med 5.9 cm/s ----- IVS/LVPW 0.99 <1.3 ann, tiss ratio, ED DP Ventricular septum E/Ea, med 20.51 ----- IVS, ED 14.1 mm ------ ann, tiss LVOT DP Diam, S 20 mm ------ LVET 360 ms ----- Area 3.14 cm^2 ------ LVOT Aorta Peak vel, 94 cm/s ----- Root diam, 33 mm ------ S ED VTI, S 24.1 cm ----- Left atrium Aortic valve AP dim 47 mm ------ Peak vel, 464.53 cm/s ----- AP dim 2.35 cm/m^2 <2.2 S index Mean vel, 318.19 cm/s ----- Right ventricle S RVID ED, 41.5 mm 19-38 VTI, S 116.52 cm ----- PLAX Mean 47 mm Hg ----- gradient, S Peak 86 mm Hg ----- gradient, S VTI ratio 0.21 ----- LVOT/AV Area, VTI 0.66 cm^2 ----- Area 0.33 cm^2/m ----- index ^2 (VTI) Peak vel 0.2 ----- ratio, LVOT/AV Area, 0.63 cm^2 ----- Vmax Area 0.32 cm^2/m ----- index ^2 (Vmax) Regurg 499 ms ----- PHT Mitral valve Peak E 121 cm/s ----- vel Peak A 121 cm/s ----- vel Mean vel, 82.2 cm/s ----- D Decelerat 327 ms 150-2 ion time 30 Pressure 72 ms ----- half-time Mean 3 mm Hg ----- gradient, D Peak 10 mm Hg ----- gradient, D Peak E/A 1 ----- ratio Area 3.06 cm^2 ----- (PHT) Area 1.53 cm^2/m ----- index ^2 (PHT) Area 1.62 cm^2 ----- (LVOT) continuit y Area 0.81 cm^2/m ----- index ^2 (LVOT cont) Annulus 46.7 cm ----- VTI Tricuspid valve Regurg 203 cm/s ----- peak vel Peak 16 mm Hg ----- RV-RA gradient, S Systemic veins Estimated 5 mm Hg ----- CVP Right ventricle Pressure, 21 mm Hg <30 S Sa vel, 11.5 cm/s ----- lat ann, tiss DP  ------------------------------------------------------------ Prepared and Electronically Authenticated by  Croitoru, Mihai 2014-11-03T13:16:[id number]   CARDIAC CATH:  HEMODYNAMICS:  RA: 11/10 Mean 9  RV: 38/5/9  PA: 38/17  PCWP: a 20; Mean 17  Oxygen Saturation: PA: 68%  AO: 97%  Cardiac Output: 5.6l/min  (Thermo); 5.0 (Fick)  Index: 2.8 l/min/m2; 2.5  ANGIOGRAPHY:  Fluoroscopy revealed severe calcification of his aortic valve.  Supravalvular aortography demonstrated markedly reduced aortic valve excursion with trivial aortic insufficiency.  1. Left main: Angiographically normal short vessel which bifurcated into the left anterior descending artery and a large dominant left circumflex coronary artery  2. LAD: Mild proximal coronary calcification. Mild 20% smooth narrowing proximally after the first diagonal vessel. The LAD wrapped around the LV apex and was otherwise free of significant disease.  3. Left circumflex: Angiographically normal dominant vessel  4. Right coronary artery: Angiographically normal nondominant right coronary artery  IMPRESSION:  Mild Pulmonary hypertension  No significant coronary objective disease with evidence for mild coronary calcification of the LAD with smooth 20% narrowing but otherwise angiographically normal vessels with a dominant left circumflex system  Severely calcified aortic valve with reduced excursion and trivial aortic insufficiency  Echocardiographic documentation of severe aortic valve stenosis with a mean gradient of 47, a peak instantaneous gradient of 86, and a calculated aortic valve area by echocardiography of 0.63 cm    RECOMMENDATION:  Aortic valve replacement surgery.  Lennette Bihari, MD, [facility]  03/15/2013  10:43 AM        Impression:  He has severe, progressive aortic stenosis with no significant coronary disease that is becoming symptomatic with heavy physical activity like riding his bike up hills. I think AVR is indicated to allow him to safely return to full physical activity and to prevent progression of symptoms, LV deterioration, and sudden death. I discussed the pros and cons of mechanical and tissue valves with him and his wife. He wants a tissue valve so that he does not have to be on coumadin. At age 89 I think this is a  reasonable choice. He would like to wait until the first week in January to get through the holidays. He should be ok as long as he does not do any heavy exertional activity which is the only thing that has brought on activity. I discussed the operative procedure with the patient and his wife including alternatives, benefits and risks; including but not limited to bleeding, blood transfusion, infection, stroke, myocardial infarction, graft failure, heart block requiring a permanent pacemaker, organ dysfunction, and death.  Drew Andrews understands and agrees to proceed.  We will schedule surgery for the first week in January 2015.   Plan:  AVR using a tissue valve in January 2015.

## 2013-04-01 ENCOUNTER — Other Ambulatory Visit: Payer: Self-pay | Admitting: *Deleted

## 2013-04-01 DIAGNOSIS — I359 Nonrheumatic aortic valve disorder, unspecified: Secondary | ICD-10-CM

## 2013-04-15 ENCOUNTER — Other Ambulatory Visit: Payer: Self-pay | Admitting: *Deleted

## 2013-04-15 DIAGNOSIS — I359 Nonrheumatic aortic valve disorder, unspecified: Secondary | ICD-10-CM

## 2013-04-29 ENCOUNTER — Telehealth: Payer: Self-pay | Admitting: *Deleted

## 2013-04-29 ENCOUNTER — Encounter (HOSPITAL_COMMUNITY): Payer: Self-pay | Admitting: Pharmacy Technician

## 2013-04-29 NOTE — Telephone Encounter (Signed)
Returned call and pt verified x 2.  Pt c/o a rash halfway between his knee and crotch.  Stated at first he had bruising and then a few weeks later he noticed the rash.  Denied rash at cath site.  Stated the rash is about 3 inches or so and he wants to get it checked out before he goes to have his surgery.  Pt advised to see PCP for evaluation and Tx as his cath was over a month ago and the rash is likely not r/t that.  Pt verbalized understanding and agreed w/ plan.

## 2013-04-29 NOTE — Telephone Encounter (Signed)
Pt's wife is calling in regards to having an itchy patch on the leg where he had his cath. Cath was done November 14th.

## 2013-05-09 ENCOUNTER — Encounter (HOSPITAL_COMMUNITY)
Admission: RE | Admit: 2013-05-09 | Discharge: 2013-05-09 | Disposition: A | Discharge status: Home / Self Care | Payer: Medicare Other | Source: Ambulatory Visit | Attending: Surgery | Admitting: Surgery

## 2013-05-09 ENCOUNTER — Ambulatory Visit (HOSPITAL_COMMUNITY)
Admission: RE | Admit: 2013-05-09 | Discharge: 2013-05-09 | Disposition: A | Discharge status: Home / Self Care | Payer: Medicare Other | Source: Ambulatory Visit | Attending: Surgery | Admitting: Surgery

## 2013-05-09 ENCOUNTER — Encounter (HOSPITAL_COMMUNITY): Payer: Self-pay

## 2013-05-09 VITALS — BP 129/79 | HR 77 | Temp 98.7°F | Resp 16 | Ht 69.5 in | Wt 186.1 lb

## 2013-05-09 DIAGNOSIS — I359 Nonrheumatic aortic valve disorder, unspecified: Secondary | ICD-10-CM

## 2013-05-09 DIAGNOSIS — Z01810 Encounter for preprocedural cardiovascular examination: Secondary | ICD-10-CM

## 2013-05-09 HISTORY — DX: Nausea with vomiting, unspecified: R11.2

## 2013-05-09 HISTORY — DX: Nausea with vomiting, unspecified: Z98.890

## 2013-05-09 LAB — PULMONARY FUNCTION TEST
DL/VA % pred: 79 %
DL/VA: 3.61 ml/min/mmHg/L
DLCO cor % pred: 74 %
DLCO cor: 22.96 ml/min/mmHg
DLCO unc % pred: 74 %
DLCO unc: 22.96 ml/min/mmHg
FEF 25-75 PRE: 4.17 L/s
FEF2575-%PRED-PRE: 171 %
FEV1-%PRED-PRE: 113 %
FEV1-Pre: 3.6 L
FEV1FVC-%Pred-Pre: 111 %
FEV6-%Pred-Pre: 106 %
FEV6-PRE: 4.33 L
FEV6FVC-%Pred-Pre: 105 %
FVC-%PRED-PRE: 101 %
FVC-PRE: 4.35 L
PRE FEV6/FVC RATIO: 99 %
Pre FEV1/FVC ratio: 83 %
RV % pred: 67 %
RV: 1.6 L
TLC % PRED: 90 %
TLC: 6.17 L

## 2013-05-09 LAB — BLOOD GAS, ARTERIAL
ACID-BASE DEFICIT: 3.4 mmol/L — AB (ref 0.0–2.0)
Bicarbonate: 19.4 mEq/L — ABNORMAL LOW (ref 20.0–24.0)
Drawn by: 344381
O2 Saturation: 98.4 %
PO2 ART: 102 mmHg — AB (ref 80.0–100.0)
Patient temperature: 98.6
TCO2: 20.2 mmol/L (ref 0–100)
pCO2 arterial: 25.2 mmHg — ABNORMAL LOW (ref 35.0–45.0)
pH, Arterial: 7.498 — ABNORMAL HIGH (ref 7.350–7.450)

## 2013-05-09 LAB — PROTIME-INR
INR: 0.98 (ref 0.00–1.49)
PROTHROMBIN TIME: 12.8 s (ref 11.6–15.2)

## 2013-05-09 LAB — URINALYSIS, ROUTINE W REFLEX MICROSCOPIC
BILIRUBIN URINE: NEGATIVE
Glucose, UA: NEGATIVE mg/dL
Hgb urine dipstick: NEGATIVE
Ketones, ur: NEGATIVE mg/dL
Leukocytes, UA: NEGATIVE
NITRITE: NEGATIVE
PH: 5 (ref 5.0–8.0)
Protein, ur: NEGATIVE mg/dL
SPECIFIC GRAVITY, URINE: 1.011 (ref 1.005–1.030)
Urobilinogen, UA: 0.2 mg/dL (ref 0.0–1.0)

## 2013-05-09 LAB — COMPREHENSIVE METABOLIC PANEL
ALT: 33 U/L (ref 0–53)
AST: 24 U/L (ref 0–37)
Albumin: 4.2 g/dL (ref 3.5–5.2)
Alkaline Phosphatase: 43 U/L (ref 39–117)
BUN: 21 mg/dL (ref 6–23)
CO2: 16 meq/L — AB (ref 19–32)
CREATININE: 1 mg/dL (ref 0.50–1.35)
Calcium: 9.1 mg/dL (ref 8.4–10.5)
Chloride: 105 mEq/L (ref 96–112)
GFR, EST AFRICAN AMERICAN: 87 mL/min — AB (ref >90)
GFR, EST NON AFRICAN AMERICAN: 75 mL/min — AB (ref >90)
GLUCOSE: 144 mg/dL — AB (ref 70–99)
Potassium: 4.2 mEq/L (ref 3.7–5.3)
SODIUM: 139 meq/L (ref 137–147)
Total Bilirubin: 0.5 mg/dL (ref 0.3–1.2)
Total Protein: 7.4 g/dL (ref 6.0–8.3)

## 2013-05-09 LAB — APTT: aPTT: 23 seconds — ABNORMAL LOW (ref 24–37)

## 2013-05-09 LAB — CBC
HCT: 39.6 % (ref 39.0–52.0)
HEMOGLOBIN: 14.2 g/dL (ref 13.0–17.0)
MCH: 34.2 pg — AB (ref 26.0–34.0)
MCHC: 35.9 g/dL (ref 30.0–36.0)
MCV: 95.4 fL (ref 78.0–100.0)
Platelets: 135 10*3/uL — ABNORMAL LOW (ref 150–400)
RBC: 4.15 MIL/uL — AB (ref 4.22–5.81)
RDW: 11.9 % (ref 11.5–15.5)
WBC: 7.4 10*3/uL (ref 4.0–10.5)

## 2013-05-09 LAB — ABO/RH: ABO/RH(D): A POS

## 2013-05-09 LAB — SURGICAL PCR SCREEN
MRSA, PCR: NEGATIVE
STAPHYLOCOCCUS AUREUS: NEGATIVE

## 2013-05-09 LAB — TYPE AND SCREEN
ABO/RH(D): A POS
Antibody Screen: NEGATIVE

## 2013-05-09 LAB — HEMOGLOBIN A1C
Hgb A1c MFr Bld: 5.5 % (ref <5.7)
Mean Plasma Glucose: 111 mg/dL (ref <117)

## 2013-05-09 MED ORDER — CHLORHEXIDINE GLUCONATE 4 % EX LIQD: 30.0000 mL | CUTANEOUS | Status: DC

## 2013-05-09 NOTE — Pre-Procedure Instructions (Signed)
Drew AlaminMichael Andrews  05/09/2013   Your procedure is scheduled on:  Monday, Jan. 12  Report to [facility] Main Entrance "A" at 5:30 AM.  Call this number if you have problems the morning of surgery: [phone]   Remember:   Do not eat food or drink liquids after midnight.   Take these medicines the morning of surgery with A SIP OF WATER: prilosec              Stop non-steroidal anti-inflammatory medications  Do not wear jewelry, make-up or nail polish.  Do not wear lotions, powders, or perfumes. You may wear deodorant.  Do not shave 48 hours prior to surgery. Men may shave face and neck.  Do not bring valuables to the hospital.  [facility] is not responsible  for any belongings or valuables.               Contacts, dentures or bridgework may not be worn into surgery.  Leave suitcase in the car. After surgery it may be brought to your room.  For patients admitted to the hospital, discharge time is determined by your   treatment team.               Patients discharged the day of surgery will not be allowed to drive  home.  Name and phone number of your driver:   Special Instructions: Shower using CHG 2 nights before surgery and the night before surgery.  If you shower the day of surgery use CHG.  Use special wash - you have one bottle of CHG for all showers.  You should use approximately 1/3 of the bottle for each shower.   Please read over the following fact sheets that you were given: Pain Booklet, Coughing and Deep Breathing, Blood Transfusion Information, Open Heart Packet, MRSA Information and Surgical Site Infection Prevention

## 2013-05-09 NOTE — Progress Notes (Signed)
Pre-op Cardiac Surgery  Carotid Findings:  1-39% ICA stenosis.  Vertebral artery flow is antegrade.  Upper Extremity Right Left  Brachial Pressures 148T 149T  Radial Waveforms T T  Ulnar Waveforms T T  Palmar Arch (Allen's Test) WNL WNL   Findings:  WNL    Lower  Extremity Right Left  Dorsalis Pedis    Anterior Tibial    Posterior Tibial    Ankle/Brachial Indices      Findings:  palpable

## 2013-05-10 ENCOUNTER — Telehealth: Payer: Self-pay | Admitting: *Deleted

## 2013-05-10 NOTE — Telephone Encounter (Signed)
Patient came in today saying he thinks he was getting a head cold.  He denies fever, chills, productive cough.  I checked his temperature in the office and it was 97.7.  His labs from 1/8 pre op appt were normal.  CXR was normal.  I told him to call in over the weekend if he gets worse.  Dr. Laneta SimmersBartle made aware.

## 2013-05-12 ENCOUNTER — Encounter (HOSPITAL_COMMUNITY): Payer: Self-pay | Admitting: Certified Registered"

## 2013-05-12 MED ORDER — SODIUM CHLORIDE 0.9 % IV SOLN
1250.0000 mg | INTRAVENOUS | Status: AC
  Administered 2013-05-13: 1250 mg via INTRAVENOUS
  Filled 2013-05-12: qty 1250

## 2013-05-12 MED ORDER — MAGNESIUM SULFATE 50 % IJ SOLN
40.0000 meq | INTRAMUSCULAR | Status: DC
  Filled 2013-05-12: qty 10

## 2013-05-12 MED ORDER — POTASSIUM CHLORIDE 2 MEQ/ML IV SOLN
80.0000 meq | INTRAVENOUS | Status: DC
  Filled 2013-05-12: qty 40

## 2013-05-12 MED ORDER — HEPARIN SODIUM (PORCINE) 1000 UNIT/ML IJ SOLN
INTRAMUSCULAR | Status: DC
  Filled 2013-05-12: qty 30

## 2013-05-12 MED ORDER — PHENYLEPHRINE HCL 10 MG/ML IJ SOLN
30.0000 ug/min | INTRAVENOUS | Status: DC
  Filled 2013-05-12: qty 2

## 2013-05-12 MED ORDER — METOPROLOL TARTRATE 12.5 MG HALF TABLET
12.5000 mg | ORAL_TABLET | Freq: Once | ORAL | Status: AC
  Administered 2013-05-13: 12.5 mg via ORAL
  Filled 2013-05-12: qty 1

## 2013-05-12 MED ORDER — DEXTROSE 5 % IV SOLN
1.5000 g | INTRAVENOUS | Status: DC
  Filled 2013-05-12: qty 1.5

## 2013-05-12 MED ORDER — SODIUM CHLORIDE 0.9 % IV SOLN
INTRAVENOUS | Status: AC
  Administered 2013-05-13: 70 mL/h via INTRAVENOUS
  Filled 2013-05-12: qty 40

## 2013-05-12 MED ORDER — DEXTROSE 5 % IV SOLN
750.0000 mg | INTRAVENOUS | Status: DC
  Filled 2013-05-12: qty 750

## 2013-05-12 MED ORDER — DOPAMINE-DEXTROSE 3.2-5 MG/ML-% IV SOLN
2.0000 ug/kg/min | INTRAVENOUS | Status: DC
  Filled 2013-05-12: qty 250

## 2013-05-12 MED ORDER — EPINEPHRINE HCL 1 MG/ML IJ SOLN
0.5000 ug/min | INTRAVENOUS | Status: DC
  Filled 2013-05-12: qty 4

## 2013-05-12 MED ORDER — SODIUM CHLORIDE 0.9 % IV SOLN
INTRAVENOUS | Status: AC
  Administered 2013-05-13: 1 [IU]/h via INTRAVENOUS
  Filled 2013-05-12: qty 1

## 2013-05-12 MED ORDER — DEXMEDETOMIDINE HCL IN NACL 400 MCG/100ML IV SOLN
0.1000 ug/kg/h | INTRAVENOUS | Status: AC
  Administered 2013-05-13: 0.2 ug/kg/h via INTRAVENOUS
  Filled 2013-05-12: qty 100

## 2013-05-12 MED ORDER — PLASMA-LYTE 148 IV SOLN
INTRAVENOUS | Status: DC
  Filled 2013-05-12: qty 2.5

## 2013-05-12 MED ORDER — NITROGLYCERIN IN D5W 200-5 MCG/ML-% IV SOLN
2.0000 ug/min | INTRAVENOUS | Status: AC
  Administered 2013-05-13: 3 ug/min via INTRAVENOUS
  Filled 2013-05-12: qty 250

## 2013-05-13 ENCOUNTER — Inpatient Hospital Stay (HOSPITAL_COMMUNITY): Payer: Medicare Other

## 2013-05-13 ENCOUNTER — Encounter (HOSPITAL_COMMUNITY)
Admission: RE | Disposition: A | Discharge status: Home / Self Care | Payer: Medicare Other | Source: Ambulatory Visit | Attending: Surgery

## 2013-05-13 ENCOUNTER — Encounter (HOSPITAL_COMMUNITY): Payer: Self-pay | Admitting: *Deleted

## 2013-05-13 ENCOUNTER — Inpatient Hospital Stay (HOSPITAL_COMMUNITY): Payer: Medicare Other | Admitting: Certified Registered"

## 2013-05-13 ENCOUNTER — Encounter (HOSPITAL_COMMUNITY): Payer: Medicare Other | Admitting: Certified Registered"

## 2013-05-13 ENCOUNTER — Inpatient Hospital Stay (HOSPITAL_COMMUNITY)
Admission: RE | Admit: 2013-05-13 | Discharge: 2013-05-17 | DRG: 220 | Disposition: A | Discharge status: Home / Self Care | Payer: Medicare Other | Source: Ambulatory Visit | Attending: Surgery | Admitting: Surgery

## 2013-05-13 DIAGNOSIS — I359 Nonrheumatic aortic valve disorder, unspecified: Principal | ICD-10-CM | POA: Diagnosis present

## 2013-05-13 DIAGNOSIS — Z952 Presence of prosthetic heart valve: Secondary | ICD-10-CM

## 2013-05-13 DIAGNOSIS — D62 Acute posthemorrhagic anemia: Secondary | ICD-10-CM | POA: Diagnosis not present

## 2013-05-13 DIAGNOSIS — E785 Hyperlipidemia, unspecified: Secondary | ICD-10-CM | POA: Diagnosis present

## 2013-05-13 DIAGNOSIS — Z7982 Long term (current) use of aspirin: Secondary | ICD-10-CM

## 2013-05-13 DIAGNOSIS — I519 Heart disease, unspecified: Secondary | ICD-10-CM

## 2013-05-13 DIAGNOSIS — I10 Essential (primary) hypertension: Secondary | ICD-10-CM | POA: Diagnosis present

## 2013-05-13 DIAGNOSIS — I2789 Other specified pulmonary heart diseases: Secondary | ICD-10-CM | POA: Diagnosis present

## 2013-05-13 DIAGNOSIS — Z8249 Family history of ischemic heart disease and other diseases of the circulatory system: Secondary | ICD-10-CM

## 2013-05-13 DIAGNOSIS — Z87891 Personal history of nicotine dependence: Secondary | ICD-10-CM

## 2013-05-13 DIAGNOSIS — Z79899 Other long term (current) drug therapy: Secondary | ICD-10-CM

## 2013-05-13 DIAGNOSIS — J9819 Other pulmonary collapse: Secondary | ICD-10-CM | POA: Diagnosis not present

## 2013-05-13 DIAGNOSIS — E8779 Other fluid overload: Secondary | ICD-10-CM | POA: Diagnosis not present

## 2013-05-13 DIAGNOSIS — K219 Gastro-esophageal reflux disease without esophagitis: Secondary | ICD-10-CM | POA: Diagnosis present

## 2013-05-13 DIAGNOSIS — I2510 Atherosclerotic heart disease of native coronary artery without angina pectoris: Secondary | ICD-10-CM | POA: Diagnosis present

## 2013-05-13 DIAGNOSIS — I5189 Other ill-defined heart diseases: Secondary | ICD-10-CM

## 2013-05-13 DIAGNOSIS — D696 Thrombocytopenia, unspecified: Secondary | ICD-10-CM | POA: Diagnosis not present

## 2013-05-13 DIAGNOSIS — Z9089 Acquired absence of other organs: Secondary | ICD-10-CM

## 2013-05-13 HISTORY — PX: AORTIC VALVE REPLACEMENT: SHX41

## 2013-05-13 HISTORY — DX: Presence of prosthetic heart valve: Z95.2

## 2013-05-13 HISTORY — PX: INTRAOPERATIVE TRANSESOPHAGEAL ECHOCARDIOGRAM: SHX5062

## 2013-05-13 LAB — POCT I-STAT 4, (NA,K, GLUC, HGB,HCT)
GLUCOSE: 126 mg/dL — AB (ref 70–99)
Glucose, Bld: 115 mg/dL — ABNORMAL HIGH (ref 70–99)
Glucose, Bld: 134 mg/dL — ABNORMAL HIGH (ref 70–99)
Glucose, Bld: 143 mg/dL — ABNORMAL HIGH (ref 70–99)
Glucose, Bld: 123 mg/dL — ABNORMAL HIGH (ref 70–99)
Glucose, Bld: 109 mg/dL — ABNORMAL HIGH (ref 70–99)
HCT: 37 % — ABNORMAL LOW (ref 39.0–52.0)
HCT: 30 % — ABNORMAL LOW (ref 39.0–52.0)
HCT: 31 % — ABNORMAL LOW (ref 39.0–52.0)
HEMATOCRIT: 33 % — AB (ref 39.0–52.0)
HEMATOCRIT: 27 % — AB (ref 39.0–52.0)
HEMATOCRIT: 33 % — AB (ref 39.0–52.0)
HEMOGLOBIN: 11.2 g/dL — AB (ref 13.0–17.0)
Hemoglobin: 12.6 g/dL — ABNORMAL LOW (ref 13.0–17.0)
Hemoglobin: 10.2 g/dL — ABNORMAL LOW (ref 13.0–17.0)
Hemoglobin: 9.2 g/dL — ABNORMAL LOW (ref 13.0–17.0)
Hemoglobin: 10.5 g/dL — ABNORMAL LOW (ref 13.0–17.0)
Hemoglobin: 11.2 g/dL — ABNORMAL LOW (ref 13.0–17.0)
POTASSIUM: 4 meq/L (ref 3.7–5.3)
POTASSIUM: 5.2 meq/L (ref 3.7–5.3)
POTASSIUM: 5.1 meq/L (ref 3.7–5.3)
Potassium: 3.9 mEq/L (ref 3.7–5.3)
Potassium: 5.7 mEq/L — ABNORMAL HIGH (ref 3.7–5.3)
Potassium: 4.7 mEq/L (ref 3.7–5.3)
SODIUM: 142 meq/L (ref 137–147)
SODIUM: 138 meq/L (ref 137–147)
SODIUM: 139 meq/L (ref 137–147)
Sodium: 140 mEq/L (ref 137–147)
Sodium: 137 mEq/L (ref 137–147)
Sodium: 137 mEq/L (ref 137–147)

## 2013-05-13 LAB — POCT I-STAT 3, ART BLOOD GAS (G3+)
ACID-BASE DEFICIT: 4 mmol/L — AB (ref 0.0–2.0)
ACID-BASE DEFICIT: 6 mmol/L — AB (ref 0.0–2.0)
ACID-BASE DEFICIT: 3 mmol/L — AB (ref 0.0–2.0)
Acid-base deficit: 4 mmol/L — ABNORMAL HIGH (ref 0.0–2.0)
Acid-base deficit: 3 mmol/L — ABNORMAL HIGH (ref 0.0–2.0)
BICARBONATE: 22.8 meq/L (ref 20.0–24.0)
BICARBONATE: 21.6 meq/L (ref 20.0–24.0)
Bicarbonate: 22.3 mEq/L (ref 20.0–24.0)
Bicarbonate: 19.7 mEq/L — ABNORMAL LOW (ref 20.0–24.0)
Bicarbonate: 21.8 mEq/L (ref 20.0–24.0)
O2 SAT: 98 %
O2 Saturation: 100 %
O2 Saturation: 100 %
O2 Saturation: 94 %
O2 Saturation: 95 %
PCO2 ART: 40 mmHg (ref 35.0–45.0)
PCO2 ART: 38.7 mmHg (ref 35.0–45.0)
PCO2 ART: 39.5 mmHg (ref 35.0–45.0)
PH ART: 7.342 — AB (ref 7.350–7.450)
PH ART: 7.366 (ref 7.350–7.450)
PO2 ART: 338 mmHg — AB (ref 80.0–100.0)
PO2 ART: 77 mmHg — AB (ref 80.0–100.0)
PO2 ART: 81 mmHg (ref 80.0–100.0)
Patient temperature: 37.2
Patient temperature: 37.7
TCO2: 24 mmol/L (ref 0–100)
TCO2: 24 mmol/L (ref 0–100)
TCO2: 21 mmol/L (ref 0–100)
TCO2: 23 mmol/L (ref 0–100)
TCO2: 23 mmol/L (ref 0–100)
pCO2 arterial: 47.9 mmHg — ABNORMAL HIGH (ref 35.0–45.0)
pCO2 arterial: 38.2 mmHg (ref 35.0–45.0)
pH, Arterial: 7.286 — ABNORMAL LOW (ref 7.350–7.450)
pH, Arterial: 7.314 — ABNORMAL LOW (ref 7.350–7.450)
pH, Arterial: 7.35 (ref 7.350–7.450)
pO2, Arterial: 210 mmHg — ABNORMAL HIGH (ref 80.0–100.0)
pO2, Arterial: 117 mmHg — ABNORMAL HIGH (ref 80.0–100.0)

## 2013-05-13 LAB — CREATININE, SERUM
CREATININE: 1.01 mg/dL (ref 0.50–1.35)
GFR calc Af Amer: 86 mL/min — ABNORMAL LOW (ref >90)
GFR, EST NON AFRICAN AMERICAN: 74 mL/min — AB (ref >90)

## 2013-05-13 LAB — CBC
HCT: 30.4 % — ABNORMAL LOW (ref 39.0–52.0)
HEMATOCRIT: 31.1 % — AB (ref 39.0–52.0)
HEMOGLOBIN: 11.2 g/dL — AB (ref 13.0–17.0)
HEMOGLOBIN: 10.8 g/dL — AB (ref 13.0–17.0)
MCH: 34.1 pg — ABNORMAL HIGH (ref 26.0–34.0)
MCH: 33.5 pg (ref 26.0–34.0)
MCHC: 36 g/dL (ref 30.0–36.0)
MCHC: 35.5 g/dL (ref 30.0–36.0)
MCV: 94.8 fL (ref 78.0–100.0)
MCV: 94.4 fL (ref 78.0–100.0)
Platelets: 102 10*3/uL — ABNORMAL LOW (ref 150–400)
Platelets: 93 10*3/uL — ABNORMAL LOW (ref 150–400)
RBC: 3.28 MIL/uL — ABNORMAL LOW (ref 4.22–5.81)
RBC: 3.22 MIL/uL — ABNORMAL LOW (ref 4.22–5.81)
RDW: 11.8 % (ref 11.5–15.5)
RDW: 11.8 % (ref 11.5–15.5)
WBC: 15.7 10*3/uL — AB (ref 4.0–10.5)
WBC: 14.2 10*3/uL — AB (ref 4.0–10.5)

## 2013-05-13 LAB — POCT I-STAT 3, VENOUS BLOOD GAS (G3P V)
Acid-base deficit: 6 mmol/L — ABNORMAL HIGH (ref 0.0–2.0)
Bicarbonate: 21 mEq/L (ref 20.0–24.0)
O2 SAT: 78 %
PCO2 VEN: 41.1 mmHg — AB (ref 45.0–50.0)
PH VEN: 7.304 — AB (ref 7.250–7.300)
PO2 VEN: 41 mmHg (ref 30.0–45.0)
Patient temperature: 34.5
TCO2: 22 mmol/L (ref 0–100)

## 2013-05-13 LAB — PROTIME-INR
INR: 1.41 (ref 0.00–1.49)
Prothrombin Time: 16.9 seconds — ABNORMAL HIGH (ref 11.6–15.2)

## 2013-05-13 LAB — POCT I-STAT, CHEM 8
BUN: 15 mg/dL (ref 6–23)
Calcium, Ion: 1.18 mmol/L (ref 1.13–1.30)
Chloride: 108 mEq/L (ref 96–112)
Creatinine, Ser: 1 mg/dL (ref 0.50–1.35)
Glucose, Bld: 129 mg/dL — ABNORMAL HIGH (ref 70–99)
HCT: 32 % — ABNORMAL LOW (ref 39.0–52.0)
HEMOGLOBIN: 10.9 g/dL — AB (ref 13.0–17.0)
Potassium: 4.1 mEq/L (ref 3.7–5.3)
SODIUM: 143 meq/L (ref 137–147)
TCO2: 21 mmol/L (ref 0–100)

## 2013-05-13 LAB — MAGNESIUM: Magnesium: 2.9 mg/dL — ABNORMAL HIGH (ref 1.5–2.5)

## 2013-05-13 LAB — PLATELET COUNT: Platelets: 101 10*3/uL — ABNORMAL LOW (ref 150–400)

## 2013-05-13 LAB — HEMOGLOBIN AND HEMATOCRIT, BLOOD
HCT: 27.5 % — ABNORMAL LOW (ref 39.0–52.0)
Hemoglobin: 10 g/dL — ABNORMAL LOW (ref 13.0–17.0)

## 2013-05-13 LAB — APTT: aPTT: 35 seconds (ref 24–37)

## 2013-05-13 SURGERY — REPLACEMENT, AORTIC VALVE, OPEN
Anesthesia: General | Site: Chest

## 2013-05-13 MED ORDER — HEPARIN SODIUM (PORCINE) 1000 UNIT/ML IJ SOLN
INTRAMUSCULAR | Status: DC | PRN
  Administered 2013-05-13: 32000 [IU] via INTRAVENOUS

## 2013-05-13 MED ORDER — SODIUM CHLORIDE 0.9 % IV SOLN
INTRAVENOUS | Status: DC
  Administered 2013-05-13 – 2013-05-14 (×2): 1.5 [IU]/h via INTRAVENOUS
  Filled 2013-05-13 (×2): qty 1

## 2013-05-13 MED ORDER — MORPHINE SULFATE 2 MG/ML IJ SOLN
1.0000 mg | INTRAMUSCULAR | Status: AC | PRN
  Administered 2013-05-13 (×2): 1 mg via INTRAVENOUS
  Administered 2013-05-13: 2 mg via INTRAVENOUS
  Filled 2013-05-13 (×4): qty 1

## 2013-05-13 MED ORDER — MORPHINE SULFATE 2 MG/ML IJ SOLN
2.0000 mg | INTRAMUSCULAR | Status: DC | PRN
  Administered 2013-05-14: 4 mg via INTRAVENOUS
  Administered 2013-05-14: 2 mg via INTRAVENOUS
  Administered 2013-05-14: 4 mg via INTRAVENOUS
  Filled 2013-05-13: qty 2
  Filled 2013-05-13: qty 1
  Filled 2013-05-13: qty 2

## 2013-05-13 MED ORDER — SODIUM CHLORIDE 0.45 % IV SOLN
INTRAVENOUS | Status: DC
  Administered 2013-05-13: 20 mL/h via INTRAVENOUS

## 2013-05-13 MED ORDER — METOPROLOL TARTRATE 1 MG/ML IV SOLN: 2.5000 mg | INTRAVENOUS | Status: DC | PRN

## 2013-05-13 MED ORDER — ONDANSETRON HCL 4 MG/2ML IJ SOLN
4.0000 mg | Freq: Four times a day (QID) | INTRAMUSCULAR | Status: DC | PRN
  Administered 2013-05-13: 4 mg via INTRAVENOUS
  Filled 2013-05-13: qty 2

## 2013-05-13 MED ORDER — FENTANYL CITRATE 0.05 MG/ML IJ SOLN
INTRAMUSCULAR | Status: DC | PRN
  Administered 2013-05-13: 150 ug via INTRAVENOUS
  Administered 2013-05-13: 50 ug via INTRAVENOUS
  Administered 2013-05-13: 100 ug via INTRAVENOUS
  Administered 2013-05-13: 50 ug via INTRAVENOUS
  Administered 2013-05-13: 100 ug via INTRAVENOUS
  Administered 2013-05-13: 50 ug via INTRAVENOUS
  Administered 2013-05-13: 100 ug via INTRAVENOUS
  Administered 2013-05-13: 250 ug via INTRAVENOUS
  Administered 2013-05-13 (×2): 150 ug via INTRAVENOUS
  Administered 2013-05-13: 100 ug via INTRAVENOUS
  Administered 2013-05-13 (×2): 50 ug via INTRAVENOUS
  Administered 2013-05-13: 100 ug via INTRAVENOUS

## 2013-05-13 MED ORDER — DEXTROSE 5 % IV SOLN
1.5000 g | INTRAVENOUS | Status: DC | PRN
  Administered 2013-05-13: .75 g via INTRAVENOUS
  Administered 2013-05-13: 1.5 g via INTRAVENOUS

## 2013-05-13 MED ORDER — THROMBIN 20000 UNITS EX SOLR
OROMUCOSAL | Status: DC | PRN
  Administered 2013-05-13 (×3): via TOPICAL

## 2013-05-13 MED ORDER — MAGNESIUM SULFATE 40 MG/ML IJ SOLN
4.0000 g | Freq: Once | INTRAMUSCULAR | Status: AC
  Administered 2013-05-13: 4 g via INTRAVENOUS
  Filled 2013-05-13: qty 100

## 2013-05-13 MED ORDER — METOPROLOL TARTRATE 12.5 MG HALF TABLET
12.5000 mg | ORAL_TABLET | Freq: Two times a day (BID) | ORAL | Status: DC
  Administered 2013-05-14: 12.5 mg via ORAL
  Filled 2013-05-13 (×3): qty 1

## 2013-05-13 MED ORDER — VANCOMYCIN HCL IN DEXTROSE 1-5 GM/200ML-% IV SOLN
1000.0000 mg | Freq: Once | INTRAVENOUS | Status: AC
  Administered 2013-05-13: 1000 mg via INTRAVENOUS
  Filled 2013-05-13: qty 200

## 2013-05-13 MED ORDER — BISACODYL 5 MG PO TBEC
10.0000 mg | DELAYED_RELEASE_TABLET | Freq: Every day | ORAL | Status: DC
  Administered 2013-05-14: 10 mg via ORAL
  Filled 2013-05-13: qty 2

## 2013-05-13 MED ORDER — ACETAMINOPHEN 650 MG RE SUPP
650.0000 mg | Freq: Once | RECTAL | Status: AC
  Administered 2013-05-13: 650 mg via RECTAL

## 2013-05-13 MED ORDER — NITROGLYCERIN IN D5W 200-5 MCG/ML-% IV SOLN
0.0000 ug/min | INTRAVENOUS | Status: DC
  Administered 2013-05-13: 0 ug/min via INTRAVENOUS

## 2013-05-13 MED ORDER — 0.9 % SODIUM CHLORIDE (POUR BTL) OPTIME
TOPICAL | Status: DC | PRN
  Administered 2013-05-13: 6000 mL

## 2013-05-13 MED ORDER — PANTOPRAZOLE SODIUM 40 MG PO TBEC: 40.0000 mg | DELAYED_RELEASE_TABLET | Freq: Every day | ORAL | Status: DC

## 2013-05-13 MED ORDER — ASPIRIN EC 325 MG PO TBEC
325.0000 mg | DELAYED_RELEASE_TABLET | Freq: Every day | ORAL | Status: DC
  Administered 2013-05-14: 325 mg via ORAL
  Filled 2013-05-13: qty 1

## 2013-05-13 MED ORDER — MIDAZOLAM HCL 2 MG/2ML IJ SOLN: 2.0000 mg | INTRAMUSCULAR | Status: DC | PRN

## 2013-05-13 MED ORDER — ONDANSETRON HCL 4 MG/2ML IJ SOLN
INTRAMUSCULAR | Status: DC | PRN
  Administered 2013-05-13: 8 mg via INTRAVENOUS

## 2013-05-13 MED ORDER — SODIUM CHLORIDE 0.9 % IJ SOLN: 3.0000 mL | INTRAMUSCULAR | Status: DC | PRN

## 2013-05-13 MED ORDER — PROPOFOL 10 MG/ML IV BOLUS
INTRAVENOUS | Status: DC | PRN
  Administered 2013-05-13: 140 mg via INTRAVENOUS

## 2013-05-13 MED ORDER — MIDAZOLAM HCL 5 MG/5ML IJ SOLN
INTRAMUSCULAR | Status: DC | PRN
  Administered 2013-05-13 (×2): 2 mg via INTRAVENOUS

## 2013-05-13 MED ORDER — FAMOTIDINE IN NACL 20-0.9 MG/50ML-% IV SOLN
20.0000 mg | Freq: Two times a day (BID) | INTRAVENOUS | Status: DC
  Administered 2013-05-13: 20 mg via INTRAVENOUS

## 2013-05-13 MED ORDER — ALBUMIN HUMAN 5 % IV SOLN
250.0000 mL | INTRAVENOUS | Status: DC | PRN
  Administered 2013-05-13: 250 mL via INTRAVENOUS

## 2013-05-13 MED ORDER — PHENYLEPHRINE HCL 10 MG/ML IJ SOLN
0.0000 ug/min | INTRAVENOUS | Status: DC
  Administered 2013-05-13: 5 ug/min via INTRAVENOUS
  Filled 2013-05-13: qty 2

## 2013-05-13 MED ORDER — ACETAMINOPHEN 160 MG/5ML PO SOLN: 650.0000 mg | Freq: Once | ORAL | Status: AC

## 2013-05-13 MED ORDER — SODIUM CHLORIDE 0.9 % IV SOLN
INTRAVENOUS | Status: DC
  Administered 2013-05-13: 20 mL/h via INTRAVENOUS

## 2013-05-13 MED ORDER — LACTATED RINGERS IV SOLN: 500.0000 mL | Freq: Once | INTRAVENOUS | Status: AC | PRN

## 2013-05-13 MED ORDER — LACTATED RINGERS IV SOLN
INTRAVENOUS | Status: DC | PRN
  Administered 2013-05-13: 07:00:00 via INTRAVENOUS

## 2013-05-13 MED ORDER — INSULIN REGULAR BOLUS VIA INFUSION
0.0000 [IU] | Freq: Three times a day (TID) | INTRAVENOUS | Status: DC
  Administered 2013-05-14: 4 [IU] via INTRAVENOUS
  Filled 2013-05-13: qty 10

## 2013-05-13 MED ORDER — ASPIRIN 81 MG PO CHEW: 324.0000 mg | CHEWABLE_TABLET | Freq: Every day | ORAL | Status: DC

## 2013-05-13 MED ORDER — THROMBIN 20000 UNITS EX SOLR
CUTANEOUS | Status: AC
  Filled 2013-05-13: qty 20000

## 2013-05-13 MED ORDER — SODIUM CHLORIDE 0.9 % IV SOLN: 250.0000 mL | INTRAVENOUS | Status: DC

## 2013-05-13 MED ORDER — OXYCODONE HCL 5 MG PO TABS
5.0000 mg | ORAL_TABLET | ORAL | Status: DC | PRN
  Administered 2013-05-14: 10 mg via ORAL
  Filled 2013-05-13: qty 2

## 2013-05-13 MED ORDER — SODIUM CHLORIDE 0.9 % IJ SOLN
3.0000 mL | Freq: Two times a day (BID) | INTRAMUSCULAR | Status: DC
  Administered 2013-05-14: 6 mL via INTRAVENOUS

## 2013-05-13 MED ORDER — BISACODYL 10 MG RE SUPP: 10.0000 mg | Freq: Every day | RECTAL | Status: DC

## 2013-05-13 MED ORDER — DEXMEDETOMIDINE HCL IN NACL 200 MCG/50ML IV SOLN
0.1000 ug/kg/h | INTRAVENOUS | Status: DC
  Administered 2013-05-13: 0.7 ug/kg/h via INTRAVENOUS
  Filled 2013-05-13: qty 50

## 2013-05-13 MED ORDER — PROTAMINE SULFATE 10 MG/ML IV SOLN
INTRAVENOUS | Status: DC | PRN
  Administered 2013-05-13: 100 mg via INTRAVENOUS
  Administered 2013-05-13: 60 mg via INTRAVENOUS
  Administered 2013-05-13: 100 mg via INTRAVENOUS
  Administered 2013-05-13: 50 mg via INTRAVENOUS

## 2013-05-13 MED ORDER — DOCUSATE SODIUM 100 MG PO CAPS
200.0000 mg | ORAL_CAPSULE | Freq: Every day | ORAL | Status: DC
  Administered 2013-05-14: 200 mg via ORAL
  Filled 2013-05-13: qty 2

## 2013-05-13 MED ORDER — HEMOSTATIC AGENTS (NO CHARGE) OPTIME
TOPICAL | Status: DC | PRN
  Administered 2013-05-13: 1 via TOPICAL

## 2013-05-13 MED ORDER — EZETIMIBE-SIMVASTATIN 10-40 MG PO TABS
1.0000 | ORAL_TABLET | Freq: Every day | ORAL | Status: DC
  Administered 2013-05-14 – 2013-05-16 (×3): 1 via ORAL
  Filled 2013-05-13 (×5): qty 1

## 2013-05-13 MED ORDER — POTASSIUM CHLORIDE 10 MEQ/50ML IV SOLN: 10.0000 meq | INTRAVENOUS | Status: AC

## 2013-05-13 MED ORDER — ACETAMINOPHEN 160 MG/5ML PO SOLN: 1000.0000 mg | Freq: Four times a day (QID) | ORAL | Status: DC

## 2013-05-13 MED ORDER — LACTATED RINGERS IV SOLN
INTRAVENOUS | Status: DC
  Administered 2013-05-13: 20 mL/h via INTRAVENOUS

## 2013-05-13 MED ORDER — LIDOCAINE HCL (CARDIAC) 20 MG/ML IV SOLN
INTRAVENOUS | Status: DC | PRN
  Administered 2013-05-13: 100 mg via INTRAVENOUS

## 2013-05-13 MED ORDER — METOPROLOL TARTRATE 25 MG/10 ML ORAL SUSPENSION
12.5000 mg | Freq: Two times a day (BID) | ORAL | Status: DC
  Filled 2013-05-13 (×3): qty 5

## 2013-05-13 MED ORDER — SODIUM CHLORIDE 0.9 % IV SOLN
INTRAVENOUS | Status: DC | PRN
  Administered 2013-05-13: 11:00:00 via INTRAVENOUS

## 2013-05-13 MED ORDER — PHENYLEPHRINE HCL 10 MG/ML IJ SOLN
20.0000 mg | INTRAVENOUS | Status: DC | PRN
  Administered 2013-05-13 (×2): 20 ug/min via INTRAVENOUS

## 2013-05-13 MED ORDER — ROCURONIUM BROMIDE 100 MG/10ML IV SOLN
INTRAVENOUS | Status: DC | PRN
  Administered 2013-05-13: 80 mg via INTRAVENOUS
  Administered 2013-05-13: 70 mg via INTRAVENOUS
  Administered 2013-05-13: 50 mg via INTRAVENOUS

## 2013-05-13 MED ORDER — DEXTROSE 5 % IV SOLN
1.5000 g | Freq: Two times a day (BID) | INTRAVENOUS | Status: DC
  Administered 2013-05-13 – 2013-05-14 (×3): 1.5 g via INTRAVENOUS
  Filled 2013-05-13 (×4): qty 1.5

## 2013-05-13 MED ORDER — ACETAMINOPHEN 500 MG PO TABS
1000.0000 mg | ORAL_TABLET | Freq: Four times a day (QID) | ORAL | Status: DC
Start: 2013-05-14 — End: 2013-05-14
  Administered 2013-05-13 – 2013-05-14 (×3): 1000 mg via ORAL
  Filled 2013-05-13 (×6): qty 2

## 2013-05-13 SURGICAL SUPPLY — 61 items
ADAPTER CARDIO PERF ANTE/RETRO (ADAPTER) ×3 IMPLANT
ATTRACTOMAT 16X20 MAGNETIC DRP (DRAPES) ×3 IMPLANT
BAG DECANTER FOR FLEXI CONT (MISCELLANEOUS) IMPLANT
BLADE STERNUM SYSTEM 6 (BLADE) ×3 IMPLANT
BLADE SURG 15 STRL LF DISP TIS (BLADE) ×2 IMPLANT
BLADE SURG 15 STRL SS (BLADE) ×1
CANISTER SUCTION 2500CC (MISCELLANEOUS) ×3 IMPLANT
CANNULA GUNDRY RCSP 15FR (MISCELLANEOUS) ×3 IMPLANT
CATH ROBINSON RED A/P 18FR (CATHETERS) ×9 IMPLANT
CATH THORACIC 36FR (CATHETERS) ×3 IMPLANT
CATH THORACIC 36FR RT ANG (CATHETERS) ×3 IMPLANT
CONT SPEC STER OR (MISCELLANEOUS) ×3 IMPLANT
COVER SURGICAL LIGHT HANDLE (MISCELLANEOUS) ×3 IMPLANT
CRADLE DONUT ADULT HEAD (MISCELLANEOUS) ×3 IMPLANT
DRAPE SLUSH/WARMER DISC (DRAPES) ×3 IMPLANT
DRSG COVADERM 4X14 (GAUZE/BANDAGES/DRESSINGS) ×3 IMPLANT
ELECT CAUTERY BLADE 6.4 (BLADE) ×3 IMPLANT
ELECT REM PT RETURN 9FT ADLT (ELECTROSURGICAL) ×6
ELECTRODE REM PT RTRN 9FT ADLT (ELECTROSURGICAL) ×4 IMPLANT
GLOVE BIO SURGEON STRL SZ 6 (GLOVE) ×3 IMPLANT
GLOVE BIO SURGEON STRL SZ 6.5 (GLOVE) IMPLANT
GLOVE BIO SURGEON STRL SZ7 (GLOVE) IMPLANT
GLOVE BIO SURGEON STRL SZ7.5 (GLOVE) IMPLANT
GLOVE BIO SURGEON STRL SZ8 (GLOVE) ×6 IMPLANT
GLOVE BIOGEL PI IND STRL 6 (GLOVE) ×10 IMPLANT
GLOVE BIOGEL PI INDICATOR 6 (GLOVE) ×5
GLOVE EUDERMIC 7 POWDERFREE (GLOVE) ×6 IMPLANT
GOWN PREVENTION PLUS XLARGE (GOWN DISPOSABLE) ×3 IMPLANT
GOWN STRL NON-REIN LRG LVL3 (GOWN DISPOSABLE) ×15 IMPLANT
HEART VENT LT CURVED (MISCELLANEOUS) ×3 IMPLANT
HEMOSTAT POWDER SURGIFOAM 1G (HEMOSTASIS) ×9 IMPLANT
HEMOSTAT SURGICEL 2X14 (HEMOSTASIS) ×3 IMPLANT
INSERT FOGARTY 61MM (MISCELLANEOUS) ×3 IMPLANT
KIT BASIN OR (CUSTOM PROCEDURE TRAY) ×3 IMPLANT
KIT CATH CPB BARTLE (MISCELLANEOUS) ×3 IMPLANT
KIT ROOM TURNOVER OR (KITS) ×3 IMPLANT
KIT SUCTION CATH 14FR (SUCTIONS) ×3 IMPLANT
LINE VENT (MISCELLANEOUS) ×3 IMPLANT
NS IRRIG 1000ML POUR BTL (IV SOLUTION) ×18 IMPLANT
PACK OPEN HEART (CUSTOM PROCEDURE TRAY) ×3 IMPLANT
PAD ARMBOARD 7.5X6 YLW CONV (MISCELLANEOUS) ×6 IMPLANT
SPONGE GAUZE 4X4 12PLY (GAUZE/BANDAGES/DRESSINGS) ×3 IMPLANT
SUT BONE WAX W31G (SUTURE) ×3 IMPLANT
SUT ETHIBON 2 0 V 52N 30 (SUTURE) ×6 IMPLANT
SUT PROLENE 3 0 SH DA (SUTURE) IMPLANT
SUT PROLENE 3 0 SH1 36 (SUTURE) ×3 IMPLANT
SUT PROLENE 4 0 RB 1 (SUTURE) ×5
SUT PROLENE 4-0 RB1 .5 CRCL 36 (SUTURE) ×10 IMPLANT
SUT STEEL 6MS V (SUTURE) IMPLANT
SUT STEEL SZ 6 DBL 3X14 BALL (SUTURE) ×9 IMPLANT
SUT VIC AB 1 CTX 36 (SUTURE) ×3
SUT VIC AB 1 CTX36XBRD ANBCTR (SUTURE) ×6 IMPLANT
SUTURE E-PAK OPEN HEART (SUTURE) ×3 IMPLANT
SYSTEM SAHARA CHEST DRAIN ATS (WOUND CARE) ×3 IMPLANT
TAPE CLOTH SURG 4X10 WHT LF (GAUZE/BANDAGES/DRESSINGS) ×3 IMPLANT
TOWEL OR 17X24 6PK STRL BLUE (TOWEL DISPOSABLE) ×6 IMPLANT
TOWEL OR 17X26 10 PK STRL BLUE (TOWEL DISPOSABLE) ×6 IMPLANT
TRAY FOLEY IC TEMP SENS 14FR (CATHETERS) ×3 IMPLANT
UNDERPAD 30X30 INCONTINENT (UNDERPADS AND DIAPERS) ×3 IMPLANT
VALVE MAGNA EASE AORTIC 25MM (Prosthesis & Implant Heart) ×3 IMPLANT
WATER STERILE IRR 1000ML POUR (IV SOLUTION) ×6 IMPLANT

## 2013-05-13 NOTE — Procedures (Signed)
Extubation Procedure Note  Patient Details:   Name: Drew Andrews DOB: 11/15/1945 MRN: [id number]   Pt extubated to [facility] after successful SICU rapid wean.  NIF -20/FVC 2.3L with good effort.  Evaluation  O2 sats: stable throughout Complications: No apparent complications Patient did tolerate procedure well.     Yes  Genia Delurner, Dionisio Aragones Apple 05/13/2013, 6:57 PM

## 2013-05-13 NOTE — Anesthesia Preprocedure Evaluation (Addendum)
Anesthesia Evaluation  Patient identified by MRN, date of birth, ID band Patient awake    Reviewed: Allergy & Precautions, H&P , NPO status , Patient's Chart, lab work & pertinent test results  History of Anesthesia Complications (+) PONV  Airway Mallampati: II TM Distance: >3 FB Neck ROM: Full    Dental  (+) Dental Advisory Given   Pulmonary former smoker,  breath sounds clear to auscultation        Cardiovascular hypertension, + Valvular Problems/Murmurs AS Rhythm:Regular Rate:Normal     Neuro/Psych    GI/Hepatic GERD-  Medicated,  Endo/Other    Renal/GU      Musculoskeletal   Abdominal   Peds  Hematology   Anesthesia Other Findings   Reproductive/Obstetrics                         Anesthesia Physical Anesthesia Plan  ASA: III  Anesthesia Plan: General   Post-op Pain Management:    Induction: Intravenous  Airway Management Planned: Oral ETT  Additional Equipment: Arterial line, PA Cath, CVP and 3D TEE  Intra-op Plan:   Post-operative Plan: Post-operative intubation/ventilation  Informed Consent: I have reviewed the patients History and Physical, chart, labs and discussed the procedure including the risks, benefits and alternatives for the proposed anesthesia with the patient or authorized representative who has indicated his/her understanding and acceptance.     Plan Discussed with: CRNA and Surgeon  Anesthesia Plan Comments:         Anesthesia Quick Evaluation

## 2013-05-13 NOTE — Brief Op Note (Signed)
05/13/2013  10:31 AM  PATIENT:  Drew Andrews  68 y.o. male  PRE-OPERATIVE DIAGNOSIS:  Severe Aortic Stenosis  POST-OPERATIVE DIAGNOSIS:  Severe Aortic Stenosis  PROCEDURE:   AORTIC VALVE REPLACEMENT (27 mm Magna Ease pericardial tissue valve)  SURGEON:  Alleen BorneBryan K Bartle, MD  ASSISTANT: Drew CeoGina Korbin Notaro, PA-C  ANESTHESIA:   general  PATIENT CONDITION:  ICU - intubated and hemodynamically stable.  PRE-OPERATIVE WEIGHT: 84 kg

## 2013-05-13 NOTE — Addendum Note (Signed)
Addendum created 05/13/13 1324 by Armandina GemmaAngela Orman Matsumura   Modules edited: Anesthesia Medication Administration

## 2013-05-13 NOTE — Anesthesia Postprocedure Evaluation (Signed)
  Anesthesia Post-op Note  Patient: Drew Andrews  Procedure(s) Performed: Procedure(s): AORTIC VALVE REPLACEMENT (AVR) (N/A) INTRAOPERATIVE TRANSESOPHAGEAL ECHOCARDIOGRAM (N/A)  Patient Location: ICU  Anesthesia Type:General  Level of Consciousness: Patient remains intubated per anesthesia plan  Airway and Oxygen Therapy: Patient remains intubated per anesthesia plan and Patient placed on Ventilator (see vital sign flow sheet for setting)  Post-op Pain: none  Post-op Assessment: Post-op Vital signs reviewed, Patient's Cardiovascular Status Stable, Respiratory Function Stable, Patent Airway, No signs of Nausea or vomiting and Pain level controlled  Post-op Vital Signs: Reviewed and stable  Complications: No apparent anesthesia complications

## 2013-05-13 NOTE — Anesthesia Procedure Notes (Addendum)
Procedure Name: Intubation Date/Time: 05/13/2013 7:59 AM Performed by: Armandina GemmaMIRARCHI, Koltin Wehmeyer Pre-anesthesia Checklist: Patient identified, Timeout performed, Emergency Drugs available, Suction available and Patient being monitored Patient Re-evaluated:Patient Re-evaluated prior to inductionOxygen Delivery Method: Circle system utilized Preoxygenation: Pre-oxygenation with 100% oxygen Intubation Type: IV induction Ventilation: Mask ventilation without difficulty Laryngoscope Size: Miller and 2 Grade View: Grade I Tube type: Oral Tube size: 8.0 mm Number of attempts: 1 Airway Equipment and Method: Stylet and Video-laryngoscopy Placement Confirmation: ETT inserted through vocal cords under direct vision,  breath sounds checked- equal and bilateral and positive ETCO2 Secured at: 22 cm Tube secured with: Tape Dental Injury: Teeth and Oropharynx as per pre-operative assessment  Comments: IV induction Kasik- intubation SRNA B. Radenel supervised and assisted by Dr Gypsy BalsamKasik- laryngoscopy with Hyacinth MeekerMiller 2 poor view- glidescope good view- 8.0 ETT- intubation atraumatic- teeth and mouth as preop.

## 2013-05-13 NOTE — H&P (Signed)
[address]       Drew Andrews [address]             [phone]      Cardiothoracic Surgery History and Physical  PCP is Gaspar Garbe, MD  Referring Provider is Lennette Bihari, MD  Chief Complaint   Patient presents with   .  Coronary Artery Disease     eval for surgery...cathed 03/15/13...ECHO 03/04/13   .  Aortic Stenosis   HPI:  The patient is a 68 year old active gentleman with a long history of a heart murmur dating back to the 1980's and know aortic stenosis that has been followed by Dr. Tresa Endo with serial echocardiograms. Over the past 9 years the aortic stenosis has progressed and in Sept. 2013 the mean gradient was 41 mm Hg. He remained asymptomatic, exercising hard 7 days per week. His most recent echo shows severe AS with a mean gradient of 47 mm Hg and a peak gradient of 86 mm Hg with an AVA of 0.63 cm2. Over the past month or so he reports some discomfort across his upper chest when riding his bike up a hill. This resolves with rest. He denies unusual dyspnea or dizziness. He has had no symptoms with activity on level ground. Cardiac cath shows no significant coronary disease.  Past Medical History   Diagnosis  Date   .  Seasonal allergies    .  GERD (gastroesophageal reflux disease)    .  LVH (left ventricular hypertrophy)      with aortic stenosis-bicuspid   .  Hyperlipidemia    .  Hypertension     Past Surgical History   Procedure  Laterality  Date   .  Appendectomy     .  Tonsillectomy     .  Hiatal hernia repair     .  Colonoscopy       X 2    Family History   Problem  Relation  Age of Onset   .  Colon cancer  Neg Hx    .  Stomach cancer  Neg Hx    .  Heart attack  Father    Social History  History   Substance Use Topics   .  Smoking status:  Former Smoker -- 1.00 packs/day     Quit date:  02/10/1974   .  Smokeless tobacco:  Never Used   .  Alcohol Use:  Yes      Comment: Drinks 1 bottle of wine daily and 1 beer    Current  Outpatient Prescriptions   Medication  Sig  Dispense  Refill   .  aspirin EC 81 MG tablet  Take 81 mg by mouth daily.     .  Ibuprofen-Diphenhydramine Cit (ADVIL PM PO)  Take 2-3 tablets by mouth at bedtime.     [name]  lisinopril (PRINIVIL,ZESTRIL) 5 MG tablet  Take 5 mg by mouth at bedtime.     [name]  omeprazole (PRILOSEC OTC) 20 MG tablet  Take 20 mg by mouth daily.     [name]  VYTORIN 10-40 MG per tablet  Take 1 tablet by mouth at bedtime.      No current facility-administered medications for this visit.    Allergies   Allergen  Reactions   .  Scopolamine      seizure   Review of Systems  Constitutional: Negative for activity change, appetite change, fatigue and unexpected weight change.  HENT:  Negative.  Sees his dentist regularly  Eyes: Negative.  Respiratory: Negative.  Cardiovascular: Positive for chest pain.  Across upper chest only whle riding his bike fast up hill  Gastrointestinal: Negative.  Endocrine: Negative.  Genitourinary: Negative.  Musculoskeletal: Negative.  Skin: Negative.  Allergic/Immunologic: Negative.  Neurological: Negative.  Hematological: Negative.  Psychiatric/Behavioral: Negative.  BP 159/88  Pulse 66  Resp 16  Ht 5' 9.5" (1.765 m)  Wt 180 lb (81.647 kg)  BMI 26.21 kg/m2  SpO2 99%  Physical Exam  Constitutional: He is oriented to person, place, and time. He appears well-developed and well-nourished. No distress.  HENT:  Head: Normocephalic and atraumatic.  Mouth/Throat: Oropharynx is clear and moist.  Eyes: Conjunctivae and EOM are normal. Pupils are equal, round, and reactive to light.  Neck: Normal range of motion. Neck supple. No JVD present. No thyromegaly present.  Cardiovascular: Normal rate, regular rhythm and intact distal pulses.  Murmur heard. 3/6 harsh crescendo/decrescendo murmur heard throughout the precordium.  Pulmonary/Chest: Effort normal and breath sounds normal. No respiratory distress. He has no rales.  Abdominal: Soft. Bowel  sounds are normal. He exhibits no distension and no mass. There is no tenderness.  Musculoskeletal: Normal range of motion. He exhibits no edema.  Lymphadenopathy:  He has no cervical adenopathy.  Neurological: He is alert and oriented to person, place, and time. No cranial nerve deficit.  Skin: Skin is warm and dry.  Psychiatric: He has a normal mood and affect.  Diagnostic Tests:  *Cardiovascular Imaging at [facility] [address] [place], [place] [address] [phone]  ------------------------------------------------------------ Transthoracic Echocardiography  Patient: Drew Andrews, Drew Andrews MR #: [id number] Study Date: 03/04/2013 Gender: M Age: 34 Height: 177.8cm Weight: 81.6kg BSA: 32m^2 Pt. Status: Room:  ORDERING Arley Phenix ATTENDING Nanetta Batty, MD REFERRING Tisovec, Adelfa Koh SONOGRAPHER Clearence Ped, RCS PERFORMING [facility] cc:  ------------------------------------------------------------ LV EF: 55% - 60%  ------------------------------------------------------------ Indications: 424.1 Aortic valve disorders.  ------------------------------------------------------------ History: PMH: Hyperlipidemia Risk factors: Hypertension.  ------------------------------------------------------------ Study Conclusions  - Left ventricle: The cavity size was normal. There was mild concentric hypertrophy. Systolic function was normal. The estimated ejection fraction was in the range of 55% to 60%. Wall motion was normal; there were no regional wall motion abnormalities. Features are consistent with a pseudonormal left ventricular filling pattern, with concomitant abnormal relaxation and increased filling pressure (grade 2 diastolic dysfunction). - Aortic valve: Possibly bicuspid; moderately calcified leaflets. There was severe stenosis. Mild regurgitation. Valve area: 0.66cm^2(VTI). Valve area: 0.63cm^2 (Vmax). - Mitral  valve: Calcified annulus. Valve area by continuity equation (using LVOT flow): 1.62cm^2. - Left atrium: The atrium was moderately dilated. - Right ventricle: The cavity size was mildly dilated. Wall thickness was normal. - Atrial septum: No defect or patent foramen ovale was identified. Transthoracic echocardiography. M-mode, complete 2D, spectral Doppler, and color Doppler. Height: Height: 177.8cm. Height: 70in. Weight: Weight: 81.6kg. Weight: 179.6lb. Body mass index: BMI: 25.8kg/m^2. Body surface area: BSA: 62m^2. Blood pressure: 164/78. Patient status: Outpatient. Location: Echo laboratory.  ------------------------------------------------------------  ------------------------------------------------------------ Left ventricle: The cavity size was normal. There was mild concentric hypertrophy. Systolic function was normal. The estimated ejection fraction was in the range of 55% to 60%. Wall motion was normal; there were no regional wall motion abnormalities. The transmitral flow pattern was normal. The tissue Doppler parameters were abnormal. Features are consistent with a pseudonormal left ventricular filling pattern, with concomitant abnormal relaxation and increased filling pressure (grade 2 diastolic dysfunction).  ------------------------------------------------------------ Aortic valve: Possibly bicuspid; moderately calcified  leaflets. Doppler: There was severe stenosis. Mild regurgitation. VTI ratio of LVOT to aortic valve: 0.21. Valve area: 0.66cm^2(VTI). Indexed valve area: 0.33cm^2/m^2 (VTI). Peak velocity ratio of LVOT to aortic valve: 0.2. Valve area: 0.63cm^2 (Vmax). Indexed valve area: 0.32cm^2/m^2 (Vmax). Mean gradient: 47mm Hg (S). Peak gradient: 86mm Hg (S).  ------------------------------------------------------------ Aorta: The aorta was normal, not dilated, and non-diseased.  ------------------------------------------------------------ Mitral valve:  Calcified annulus. Doppler: Valve area by pressure half-time: 3.06cm^2. Indexed valve area by pressure half-time: 1.53cm^2/m^2. Valve area by continuity equation (using LVOT flow): 1.62cm^2. Indexed valve area by continuity equation (using LVOT flow): 0.81cm^2/m^2. Mean gradient: 3mm Hg (D). Peak gradient: 10mm Hg (D).  ------------------------------------------------------------ Left atrium: LA volume/ BSA = 35.5 ml/m2 The atrium was moderately dilated.  ------------------------------------------------------------ Atrial septum: No defect or patent foramen ovale was identified.  ------------------------------------------------------------ Right ventricle: The cavity size was mildly dilated. Wall thickness was normal. Systolic function was normal.  ------------------------------------------------------------ Pulmonic valve: The valve appears to be grossly normal. Doppler: Mild regurgitation.  ------------------------------------------------------------ Tricuspid valve: Structurally normal valve. Leaflet separation was normal. Doppler: Transvalvular velocity was within the normal range. Trivial regurgitation.  ------------------------------------------------------------ Right atrium: The atrium was normal in size.  ------------------------------------------------------------ Pericardium: There was no pericardial effusion.  ------------------------------------------------------------ Systemic veins: Inferior vena cava: The vessel was normal in size; the respirophasic diameter changes were in the normal range (= 50%); findings are consistent with normal central venous pressure. Diameter: 16mm.  ------------------------------------------------------------ Post procedure conclusions Ascending Aorta:  - The aorta was normal, not dilated, and non-diseased.  ------------------------------------------------------------  2D measurements Normal Doppler measurements Norma IVC  l Diam 16 mm ------ Main pulmonary artery Left ventricle Pressure, 21 mm Hg =30 LVID ED, 48.5 mm 43-52 S chord, Left ventricle PLAX Ea, lat 6.5 cm/s ----- LVID ES, 32.5 mm 23-38 ann, tiss chord, DP PLAX E/Ea, lat 18.62 ----- FS, chord, 33 % >29 ann, tiss PLAX DP LVPW, ED 14.2 mm ------ Ea, med 5.9 cm/s ----- IVS/LVPW 0.99 <1.3 ann, tiss ratio, ED DP Ventricular septum E/Ea, med 20.51 ----- IVS, ED 14.1 mm ------ ann, tiss LVOT DP Diam, S 20 mm ------ LVET 360 ms ----- Area 3.14 cm^2 ------ LVOT Aorta Peak vel, 94 cm/s ----- Root diam, 33 mm ------ S ED VTI, S 24.1 cm ----- Left atrium Aortic valve AP dim 47 mm ------ Peak vel, 464.53 cm/s ----- AP dim 2.35 cm/m^2 <2.2 S index Mean vel, 318.19 cm/s ----- Right ventricle S RVID ED, 41.5 mm 19-38 VTI, S 116.52 cm ----- PLAX Mean 47 mm Hg ----- gradient, S Peak 86 mm Hg ----- gradient, S VTI ratio 0.21 ----- LVOT/AV Area, VTI 0.66 cm^2 ----- Area 0.33 cm^2/m ----- index ^2 (VTI) Peak vel 0.2 ----- ratio, LVOT/AV Area, 0.63 cm^2 ----- Vmax Area 0.32 cm^2/m ----- index ^2 (Vmax) Regurg 499 ms ----- PHT Mitral valve Peak E 121 cm/s ----- vel Peak A 121 cm/s ----- vel Mean vel, 82.2 cm/s ----- D Decelerat 327 ms 150-2 ion time 30 Pressure 72 ms ----- half-time Mean 3 mm Hg ----- gradient, D Peak 10 mm Hg ----- gradient, D Peak E/A 1 ----- ratio Area 3.06 cm^2 ----- (PHT) Area 1.53 cm^2/m ----- index ^2 (PHT) Area 1.62 cm^2 ----- (LVOT) continuit y Area 0.81 cm^2/m ----- index ^2 (LVOT cont) Annulus 46.7 cm ----- VTI Tricuspid valve Regurg 203 cm/s ----- peak vel Peak 16 mm Hg ----- RV-RA gradient, S Systemic veins Estimated 5 mm Hg ----- CVP Right ventricle Pressure, 21 mm Hg <30 S  Sa vel, 11.5 cm/s ----- lat ann, tiss DP  ------------------------------------------------------------ Prepared and Electronically Authenticated by  Croitoru, Mihai 2014-11-03T13:16:[id number]   CARDIAC CATH:  HEMODYNAMICS:  RA: 11/10 Mean 9  RV: 38/5/9  PA: 38/17  PCWP: a 20; Mean 17  Oxygen Saturation: PA: 68%  AO: 97%  Cardiac Output: 5.6l/min (Thermo); 5.0 (Fick)  Index: 2.8 l/min/m2; 2.5  ANGIOGRAPHY:  Fluoroscopy revealed severe calcification of his aortic valve.  Supravalvular aortography demonstrated markedly reduced aortic valve excursion with trivial aortic insufficiency.  1. Left main: Angiographically normal short vessel which bifurcated into the left anterior descending artery and a large dominant left circumflex coronary artery  2. LAD: Mild proximal coronary calcification. Mild 20% smooth narrowing proximally after the first diagonal vessel. The LAD wrapped around the LV apex and was otherwise free of significant disease.  3. Left circumflex: Angiographically normal dominant vessel  4. Right coronary artery: Angiographically normal nondominant right coronary artery  IMPRESSION:  Mild Pulmonary hypertension  No significant coronary objective disease with evidence for mild coronary calcification of the LAD with smooth 20% narrowing but otherwise angiographically normal vessels with a dominant left circumflex system  Severely calcified aortic valve with reduced excursion and trivial aortic insufficiency  Echocardiographic documentation of severe aortic valve stenosis with a mean gradient of 47, a peak instantaneous gradient of 86, and a calculated aortic valve area by echocardiography of 0.63 cm     RECOMMENDATION:  Aortic valve replacement surgery.  Lennette Biharihomas A. Kelly, MD, [facility]  03/15/2013  10:43 AM      Impression:  He has severe, progressive aortic stenosis with no significant coronary disease that is becoming symptomatic with heavy physical activity like riding his bike up hills. I think AVR is indicated to allow him to safely return to full physical activity and to prevent progression of symptoms, LV deterioration, and sudden death. I discussed the pros and  cons of mechanical and tissue valves with him and his wife. He wants a tissue valve so that he does not have to be on coumadin. At age 68 I think this is a reasonable choice. He would like to wait until the first week in January to get through the holidays. He should be ok as long as he does not do any heavy exertional activity which is the only thing that has brought on activity. I discussed the operative procedure with the patient and his wife including alternatives, benefits and risks; including but not limited to bleeding, blood transfusion, infection, stroke, myocardial infarction, graft failure, heart block requiring a permanent pacemaker, organ dysfunction, and death. Drew AlaminMichael Andrews understands and agrees to proceed. We will schedule surgery for the first week in January 2015.    Plan:   AVR using a tissue valve

## 2013-05-13 NOTE — Progress Notes (Signed)
[facility] BRIEF SICU PROGRESS NOTE  Day of Surgery  S/P Procedure(s) (LRB): AORTIC VALVE REPLACEMENT (AVR) (N/A) INTRAOPERATIVE TRANSESOPHAGEAL ECHOCARDIOGRAM (N/A)   Extubated uneventfully Neuro grossly intact AAI paced w/ stable hemodynamics on no drips Chest tube output low UOP excellent Labs okay  Plan: Continue routine early postop  OWEN,CLARENCE H 05/13/2013 5:52 PM

## 2013-05-13 NOTE — Preoperative (Signed)
Beta Blockers   Reason not to administer Beta Blockers:Not Applicable 

## 2013-05-13 NOTE — Interval H&P Note (Signed)
History and Physical Interval Note:  05/13/2013 7:27 AM  Rosalyn GessMichael E Shifflett  has presented today for surgery, with the diagnosis of SEVERE AS  The various methods of treatment have been discussed with the patient and family. After consideration of risks, benefits and other options for treatment, the patient has consented to  Procedure(s): AORTIC VALVE REPLACEMENT (AVR) (N/A) INTRAOPERATIVE TRANSESOPHAGEAL ECHOCARDIOGRAM (N/A) as a surgical intervention .  The patient's history has been reviewed, patient examined, no change in status, stable for surgery.  I have reviewed the patient's chart and labs.  Questions were answered to the patient's satisfaction.     Alleen BorneBARTLE,Caterra Ostroff K

## 2013-05-13 NOTE — Transfer of Care (Signed)
Immediate Anesthesia Transfer of Care Note  Patient: Drew Andrews  Procedure(s) Performed: Procedure(s): AORTIC VALVE REPLACEMENT (AVR) (N/A) INTRAOPERATIVE TRANSESOPHAGEAL ECHOCARDIOGRAM (N/A)  Patient Location: PACU and SICU  Anesthesia Type:General  Level of Consciousness: unresponsive  Airway & Oxygen Therapy: Patient remains intubated per anesthesia plan and Patient placed on Ventilator (see vital sign flow sheet for setting)  Post-op Assessment: Report given to PACU RN and Post -op Vital signs reviewed and stable  Post vital signs: Reviewed and stable  Complications: No apparent anesthesia complications

## 2013-05-13 NOTE — OR Nursing (Signed)
First call to SICU at 1053, second call to SICU at 1127

## 2013-05-13 NOTE — Op Note (Signed)
CARDIOVASCULAR SURGERY OPERATIVE NOTE  05/13/2013 Anup Brigham Tomberlin [id number]  Surgeon:  Alleen Borne, MD  First Assistant: Coral Ceo, [facility]   Preoperative Diagnosis:  Severe aortic stenosis   Postoperative Diagnosis:  Same   Procedure:  1. Median Sternotomy 2. Extracorporeal circulation 3.   Aortic valve replacement using a 25 mm Edwards Magna-Ease Pericardial valve.  Anesthesia:  General Endotracheal   Clinical History/Surgical Indication:  The patient is a 68 year old active gentleman with a long history of a heart murmur dating back to the 1980's and know aortic stenosis that has been followed by Dr. Tresa Endo with serial echocardiograms. Over the past 9 years the aortic stenosis has progressed and in Sept. 2013 the mean gradient was 41 mm Hg. He remained asymptomatic, exercising hard 7 days per week. His most recent echo shows severe AS with a mean gradient of 47 mm Hg and a peak gradient of 86 mm Hg with an AVA of 0.63 cm2. Over the past month or so he reports some discomfort across his upper chest when riding his bike up a hill. This resolves with rest. He denies unusual dyspnea or dizziness. He has had no symptoms with activity on level ground. Cardiac cath shows no significant coronary disease. He has severe, progressive aortic stenosis with no significant coronary disease that is becoming symptomatic with heavy physical activity like riding his bike up hills. I think AVR is indicated to allow him to safely return to full physical activity and to prevent progression of symptoms, LV deterioration, and sudden death. I discussed the pros and cons of mechanical and tissue valves with him and his wife. He wants a tissue valve so that he does not have to be on coumadin. At age 68 I think this is a reasonable choice. He would like to wait until the first week in January to get through the holidays. He should be ok as long as he does not do any heavy exertional activity which is the  only thing that has brought on activity. I discussed the operative procedure with the patient and his wife including alternatives, benefits and risks; including but not limited to bleeding, blood transfusion, infection, stroke, myocardial infarction, graft failure, heart block requiring a permanent pacemaker, organ dysfunction, and death. Iverson Alamin understands and agrees to proceed.    Preparation:  The patient was seen in the preoperative holding area and the correct patient, correct operation were confirmed with the patient after reviewing the medical record and catheterization. The consent was signed by me. Preoperative antibiotics were given. A pulmonary arterial line and radial arterial line were placed by the anesthesia team. The patient was taken back to the operating room and positioned supine on the operating room table. After being placed under general endotracheal anesthesia by the anesthesia team a foley catheter was placed. The neck, chest, abdomen, and both legs were prepped with betadine soap and solution and draped in the usual sterile manner. A surgical time-out was taken and the correct patient and operative procedure were confirmed with the nursing and anesthesia staff.   Pre-bypass TEE:   Complete TEE assessment was performed by Dr. Bedelia Person. This showed severe calcific aortic stenosis with a bicuspid aortic valve. There was mitral annular calcification without stenosis or regurgitation. EF 60%.    Post-bypass TEE:   Normal functioning prosthetic aortic valve with no perivalvular leak or regurgitation through the valve. Left ventricular function preserved. No mitral regurgitation.    Cardiopulmonary Bypass:  A median sternotomy  was performed. The pericardium was opened in the midline. Right ventricular function appeared normal. The ascending aorta was of normal size and had no palpable plaque. There were no contraindications to aortic cannulation or  cross-clamping. The patient was fully systemically heparinized and the ACT was maintained > 400 sec. The proximal aortic arch was cannulated with a 20 F aortic cannula for arterial inflow. Venous cannulation was performed via the right atrial appendage using a two-staged venous cannula. An antegrade cardioplegia/vent cannula was inserted into the mid-ascending aorta. A left ventricular vent was placed via the right superior pulmonary vein. A retrograde cardioplegia cannnula was placed into the coronary sinus via the right atrium. Aortic occlusion was performed with a single cross-clamp. Systemic cooling to 32 degrees Centigrade and topical cooling of the heart with iced saline were used. Hyperkalemic antegrade cold blood cardioplegia was used to induce diastolic arrest and then cold blood retrograde cardioplegia was given at about 20 minute intervals throughout the period of arrest to maintain myocardial temperature at or below 10 degrees centigrade. A temperature probe was inserted into the interventricular septum and an insulating pad was placed in the pericardium. Carbon dioxide was insufflated into the pericardium at 5L/min throughout the procedure to minimize intracardiac air.   Aortic Valve Replacement:   A transverse aortotomy was performed 1 cm above the take-off of the right coronary artery. The native valve was functionally bicuspid with complete fusion of the left and right cusps with calcified leaflets and moderate annular calcification. There were 3 commissures and 3 sinuses. The ostia of the coronary arteries were in normal position and were not obstructed. The native valve leaflets were excised and the annulus was decalcified with rongeurs. Care was taken to remove all particulate debris. The left ventricle was directly inspected for debris and then irrigated with ice saline solution. The annulus was sized and a size 25 mm  Edwards Magna-Ease Pericardial valve was chosen. The model number was  [id number] and the serial number was [id number]. While the valve was being prepared 2-0 Ethibond pledgeted horizontal mattress sutures were placed around the annulus with the pledgets in a sub-annular position. The sutures were placed through the sewing ring and the valve lowered into place. The sutures were tied sequentially. The valve seated nicely and the coronary ostia were not obstructed. The prosthetic valve leaflets moved normally and there was no sub-valvular obstruction. The aortotomy was closed using 4-0 Prolene suture in 2 layers with felt strips to reinforce the closure.  Completion:  The patient was rewarmed to 37 degrees Centigrade. De-airing maneuvers were performed and the head placed in trendelenburg position. The crossclamp was removed with a time of 68 minutes. There was spontaneous return of sinus rhythm. The aortotomy was checked for hemostasis. Two temporary epicardial pacing wires were placed on the right atrium and two on the right ventricle. The left ventricular vent and retrograde cardioplegia cannulas were removed. The patient was weaned from CPB without difficulty on no inotropes. CPB time was 90 minutes. Cardiac output was 5 LPM. Heparin was fully reversed with protamine and the aortic and venous cannulas removed. Hemostasis was achieved. Mediastinal drainage tubes were placed. The sternum was closed with double #6 stainless steel wires. The fascia was closed with continuous # 1 vicryl suture. The subcutaneous tissue was closed with 2-0 vicryl continuous suture. The skin was closed with 3-0 vicryl subcuticular suture. All sponge, needle, and instrument counts were reported correct at the end of the case. Dry sterile dressings were placed over  the incisions and around the chest tubes which were connected to pleurevac suction. The patient was then transported to the surgical intensive care unit in critical but stable condition.

## 2013-05-14 ENCOUNTER — Inpatient Hospital Stay (HOSPITAL_COMMUNITY): Payer: Medicare Other

## 2013-05-14 LAB — BASIC METABOLIC PANEL
BUN: 16 mg/dL (ref 6–23)
CALCIUM: 7.7 mg/dL — AB (ref 8.4–10.5)
CO2: 21 mEq/L (ref 19–32)
CREATININE: 1.03 mg/dL (ref 0.50–1.35)
Chloride: 106 mEq/L (ref 96–112)
GFR calc Af Amer: 84 mL/min — ABNORMAL LOW (ref >90)
GFR calc non Af Amer: 72 mL/min — ABNORMAL LOW (ref >90)
GLUCOSE: 121 mg/dL — AB (ref 70–99)
Potassium: 4.2 mEq/L (ref 3.7–5.3)
SODIUM: 139 meq/L (ref 137–147)

## 2013-05-14 LAB — CBC
HCT: 28 % — ABNORMAL LOW (ref 39.0–52.0)
HEMATOCRIT: 26.9 % — AB (ref 39.0–52.0)
HEMOGLOBIN: 10 g/dL — AB (ref 13.0–17.0)
Hemoglobin: 9.6 g/dL — ABNORMAL LOW (ref 13.0–17.0)
MCH: 33.9 pg (ref 26.0–34.0)
MCH: 34.3 pg — ABNORMAL HIGH (ref 26.0–34.0)
MCHC: 35.7 g/dL (ref 30.0–36.0)
MCHC: 35.7 g/dL (ref 30.0–36.0)
MCV: 94.9 fL (ref 78.0–100.0)
MCV: 96.1 fL (ref 78.0–100.0)
Platelets: 70 10*3/uL — ABNORMAL LOW (ref 150–400)
Platelets: 66 10*3/uL — ABNORMAL LOW (ref 150–400)
RBC: 2.95 MIL/uL — ABNORMAL LOW (ref 4.22–5.81)
RBC: 2.8 MIL/uL — AB (ref 4.22–5.81)
RDW: 12.1 % (ref 11.5–15.5)
RDW: 12.3 % (ref 11.5–15.5)
WBC: 12.7 10*3/uL — ABNORMAL HIGH (ref 4.0–10.5)
WBC: 14.2 10*3/uL — ABNORMAL HIGH (ref 4.0–10.5)

## 2013-05-14 LAB — GLUCOSE, CAPILLARY
GLUCOSE-CAPILLARY: 74 mg/dL (ref 70–99)
GLUCOSE-CAPILLARY: 133 mg/dL — AB (ref 70–99)
GLUCOSE-CAPILLARY: 137 mg/dL — AB (ref 70–99)
GLUCOSE-CAPILLARY: 130 mg/dL — AB (ref 70–99)
GLUCOSE-CAPILLARY: 145 mg/dL — AB (ref 70–99)
GLUCOSE-CAPILLARY: 126 mg/dL — AB (ref 70–99)
GLUCOSE-CAPILLARY: 129 mg/dL — AB (ref 70–99)
GLUCOSE-CAPILLARY: 113 mg/dL — AB (ref 70–99)
GLUCOSE-CAPILLARY: 112 mg/dL — AB (ref 70–99)
GLUCOSE-CAPILLARY: 77 mg/dL (ref 70–99)
Glucose-Capillary: 104 mg/dL — ABNORMAL HIGH (ref 70–99)
Glucose-Capillary: 104 mg/dL — ABNORMAL HIGH (ref 70–99)
Glucose-Capillary: 113 mg/dL — ABNORMAL HIGH (ref 70–99)
Glucose-Capillary: 113 mg/dL — ABNORMAL HIGH (ref 70–99)
Glucose-Capillary: 114 mg/dL — ABNORMAL HIGH (ref 70–99)
Glucose-Capillary: 117 mg/dL — ABNORMAL HIGH (ref 70–99)
Glucose-Capillary: 122 mg/dL — ABNORMAL HIGH (ref 70–99)
Glucose-Capillary: 122 mg/dL — ABNORMAL HIGH (ref 70–99)
Glucose-Capillary: 125 mg/dL — ABNORMAL HIGH (ref 70–99)
Glucose-Capillary: 126 mg/dL — ABNORMAL HIGH (ref 70–99)
Glucose-Capillary: 129 mg/dL — ABNORMAL HIGH (ref 70–99)
Glucose-Capillary: 130 mg/dL — ABNORMAL HIGH (ref 70–99)
Glucose-Capillary: 135 mg/dL — ABNORMAL HIGH (ref 70–99)
Glucose-Capillary: 135 mg/dL — ABNORMAL HIGH (ref 70–99)
Glucose-Capillary: 137 mg/dL — ABNORMAL HIGH (ref 70–99)
Glucose-Capillary: 139 mg/dL — ABNORMAL HIGH (ref 70–99)
Glucose-Capillary: 140 mg/dL — ABNORMAL HIGH (ref 70–99)
Glucose-Capillary: 153 mg/dL — ABNORMAL HIGH (ref 70–99)
Glucose-Capillary: 73 mg/dL (ref 70–99)

## 2013-05-14 LAB — CREATININE, SERUM
Creatinine, Ser: 0.95 mg/dL (ref 0.50–1.35)
GFR calc non Af Amer: 83 mL/min — ABNORMAL LOW (ref >90)

## 2013-05-14 LAB — MAGNESIUM
Magnesium: 2.4 mg/dL (ref 1.5–2.5)
Magnesium: 2.2 mg/dL (ref 1.5–2.5)

## 2013-05-14 MED ORDER — OXYCODONE HCL 5 MG PO TABS
5.0000 mg | ORAL_TABLET | ORAL | Status: DC | PRN
  Administered 2013-05-14: 10 mg via ORAL
  Filled 2013-05-14: qty 2

## 2013-05-14 MED ORDER — ACETAMINOPHEN 325 MG PO TABS
650.0000 mg | ORAL_TABLET | Freq: Four times a day (QID) | ORAL | Status: DC | PRN
  Administered 2013-05-16 (×2): 650 mg via ORAL
  Filled 2013-05-14 (×2): qty 2

## 2013-05-14 MED ORDER — ONDANSETRON HCL 4 MG/2ML IJ SOLN: 4.0000 mg | Freq: Four times a day (QID) | INTRAMUSCULAR | Status: DC | PRN

## 2013-05-14 MED ORDER — INSULIN ASPART 100 UNIT/ML ~~LOC~~ SOLN
0.0000 [IU] | Freq: Three times a day (TID) | SUBCUTANEOUS | Status: DC
  Administered 2013-05-14 – 2013-05-15 (×3): 2 [IU] via SUBCUTANEOUS

## 2013-05-14 MED ORDER — SODIUM CHLORIDE 0.9 % IJ SOLN
3.0000 mL | Freq: Two times a day (BID) | INTRAMUSCULAR | Status: DC
  Administered 2013-05-14 – 2013-05-16 (×5): 3 mL via INTRAVENOUS

## 2013-05-14 MED ORDER — INSULIN ASPART 100 UNIT/ML ~~LOC~~ SOLN: 0.0000 [IU] | SUBCUTANEOUS | Status: DC

## 2013-05-14 MED ORDER — INSULIN DETEMIR 100 UNIT/ML ~~LOC~~ SOLN
10.0000 [IU] | Freq: Once | SUBCUTANEOUS | Status: AC
  Administered 2013-05-14: 10 [IU] via SUBCUTANEOUS
  Filled 2013-05-14: qty 0.1

## 2013-05-14 MED ORDER — ONDANSETRON HCL 4 MG PO TABS: 4.0000 mg | ORAL_TABLET | Freq: Four times a day (QID) | ORAL | Status: DC | PRN

## 2013-05-14 MED ORDER — FUROSEMIDE 40 MG PO TABS
40.0000 mg | ORAL_TABLET | Freq: Every day | ORAL | Status: AC
  Administered 2013-05-14 – 2013-05-16 (×3): 40 mg via ORAL
  Filled 2013-05-14 (×3): qty 1

## 2013-05-14 MED ORDER — METOPROLOL TARTRATE 25 MG PO TABS
25.0000 mg | ORAL_TABLET | Freq: Two times a day (BID) | ORAL | Status: DC
  Administered 2013-05-15 – 2013-05-16 (×4): 25 mg via ORAL
  Filled 2013-05-14 (×7): qty 1

## 2013-05-14 MED ORDER — DOCUSATE SODIUM 100 MG PO CAPS
200.0000 mg | ORAL_CAPSULE | Freq: Every day | ORAL | Status: DC
  Administered 2013-05-15 – 2013-05-16 (×2): 200 mg via ORAL
  Filled 2013-05-14 (×3): qty 2

## 2013-05-14 MED ORDER — LISINOPRIL 5 MG PO TABS
5.0000 mg | ORAL_TABLET | Freq: Every day | ORAL | Status: DC
  Administered 2013-05-14 – 2013-05-16 (×3): 5 mg via ORAL
  Filled 2013-05-14 (×4): qty 1

## 2013-05-14 MED ORDER — BISACODYL 10 MG RE SUPP
10.0000 mg | Freq: Every day | RECTAL | Status: DC | PRN
Start: 2013-05-14 — End: 2013-05-17

## 2013-05-14 MED ORDER — BISACODYL 5 MG PO TBEC: 10.0000 mg | DELAYED_RELEASE_TABLET | Freq: Every day | ORAL | Status: DC | PRN

## 2013-05-14 MED ORDER — POTASSIUM CHLORIDE CRYS ER 20 MEQ PO TBCR
40.0000 meq | EXTENDED_RELEASE_TABLET | Freq: Every day | ORAL | Status: AC
  Administered 2013-05-14 – 2013-05-16 (×3): 40 meq via ORAL
  Filled 2013-05-14 (×3): qty 2

## 2013-05-14 MED ORDER — TRAMADOL HCL 50 MG PO TABS: 50.0000 mg | ORAL_TABLET | ORAL | Status: DC | PRN

## 2013-05-14 MED ORDER — SODIUM CHLORIDE 0.9 % IV SOLN: 250.0000 mL | INTRAVENOUS | Status: DC | PRN

## 2013-05-14 MED ORDER — MOVING RIGHT ALONG BOOK
Freq: Once | Status: AC
  Administered 2013-05-14: 17:00:00
  Filled 2013-05-14: qty 1

## 2013-05-14 MED ORDER — SODIUM CHLORIDE 0.9 % IJ SOLN
3.0000 mL | INTRAMUSCULAR | Status: DC | PRN
  Administered 2013-05-16: 3 mL via INTRAVENOUS

## 2013-05-14 MED ORDER — FAMOTIDINE 20 MG PO TABS
20.0000 mg | ORAL_TABLET | Freq: Two times a day (BID) | ORAL | Status: DC
  Administered 2013-05-15 – 2013-05-16 (×2): 20 mg via ORAL
  Filled 2013-05-14 (×7): qty 1

## 2013-05-14 MED FILL — Heparin Sodium (Porcine) Inj 1000 Unit/ML: INTRAMUSCULAR | Qty: 30 | Status: AC

## 2013-05-14 MED FILL — Sodium Bicarbonate IV Soln 8.4%: INTRAVENOUS | Qty: 50 | Status: AC

## 2013-05-14 MED FILL — Sodium Chloride IV Soln 0.9%: INTRAVENOUS | Qty: 2000 | Status: AC

## 2013-05-14 MED FILL — Electrolyte-R (PH 7.4) Solution: INTRAVENOUS | Qty: 1000 | Status: AC

## 2013-05-14 MED FILL — Mannitol IV Soln 20%: INTRAVENOUS | Qty: 500 | Status: AC

## 2013-05-14 MED FILL — Lidocaine HCl IV Inj 20 MG/ML: INTRAVENOUS | Qty: 10 | Status: AC

## 2013-05-14 MED FILL — Magnesium Sulfate Inj 50%: INTRAMUSCULAR | Qty: 10 | Status: AC

## 2013-05-14 MED FILL — Potassium Chloride Inj 2 mEq/ML: INTRAVENOUS | Qty: 40 | Status: AC

## 2013-05-14 NOTE — Progress Notes (Signed)
Pt transferred to 2W23 from 2S11. Pt walked to 2W pushing wheelchair-tolerated well. VSS. Bedside report given to Asher MuirJamie, Charity fundraiserN. Pt's only belongings transferred with him were his glasses. Pt's wife aware of transfer. Right IJ sleeve removed upon arrival to unit, pt on bedrest for 30 min-RN aware. Will continue to monitor closely. Wavie Hashimi Jolan Upchurch,RN

## 2013-05-14 NOTE — Progress Notes (Signed)
1 Day Post-Op Procedure(s) (LRB): AORTIC VALVE REPLACEMENT (AVR) (N/A) INTRAOPERATIVE TRANSESOPHAGEAL ECHOCARDIOGRAM (N/A) Subjective: No complaints  Objective: Vital signs in last 24 hours: Temp:  [96.3 F (35.7 C)-100.8 F (38.2 C)] 100.4 F (38 C) (01/13 0700) Pulse Rate:  [68-106] 100 (01/13 0700) Cardiac Rhythm:  [-] Sinus tachycardia (01/13 0400) Resp:  [11-25] 16 (01/13 0700) BP: (89-134)/(54-78) 111/62 mmHg (01/13 0700) SpO2:  [91 %-100 %] 96 % (01/13 0700) FiO2 (%):  [40 %-50 %] 40 % (01/12 1515) Weight:  [84.36 kg (185 lb 15.7 oz)-85.9 kg (189 lb 6 oz)] 85.9 kg (189 lb 6 oz) (01/13 0500)  Hemodynamic parameters for last 24 hours: PAP: (23-37)/(8-16) 29/15 mmHg CO:  [3.4 L/min-9 L/min] 9 L/min CI:  [1.7 L/min/m2-4.5 L/min/m2] 4.5 L/min/m2  Intake/Output from previous day: 01/12 0701 - 01/13 0700 In: 4515 [P.O.:250; I.V.:3573; Blood:262; NG/GT:30; IV Piggyback:400] Out: 6604 [Urine:3605; Emesis/NG output:100; Blood:2319; Chest Tube:580] Intake/Output this shift:    General appearance: alert and cooperative Neurologic: intact Heart: regular rate and rhythm, S1, S2 normal, no murmur, click, rub or gallop Lungs: clear to auscultation bilaterally Extremities: extremities normal, atraumatic, no cyanosis or edema Wound: dressing dry  Lab Results:  Recent Labs  05/13/13 1834 05/14/13 0400  WBC 14.2* 12.7*  HGB 10.8* 10.0*  HCT 30.4* 28.0*  PLT 93* 70*   BMET:  Recent Labs  05/13/13 1826 05/13/13 1834 05/14/13 0400  NA 143  --  139  K 4.1  --  4.2  CL 108  --  106  CO2  --   --  21  GLUCOSE 129*  --  121*  BUN 15  --  16  CREATININE 1.00 1.01 1.03  CALCIUM  --   --  7.7*    PT/INR:  Recent Labs  05/13/13 1207  LABPROT 16.9*  INR 1.41   ABG    Component Value Date/Time   PHART 7.350 05/13/2013 1714   HCO3 21.6 05/13/2013 1714   TCO2 21 05/13/2013 1826   ACIDBASEDEF 3.0* 05/13/2013 1714   O2SAT 95.0 05/13/2013 1714   CBG (last 3)   Recent  Labs  05/14/13 0145 05/14/13 0252 05/14/13 0356  GLUCAP 126* 129* 73   CXR: clear  ECG: sinus rhythm, no acute changes  Assessment/Plan: S/P Procedure(s) (LRB): AORTIC VALVE REPLACEMENT (AVR) (N/A) INTRAOPERATIVE TRANSESOPHAGEAL ECHOCARDIOGRAM (N/A) Mobilize Diuresis Diabetes control d/c tubes/lines Plan for transfer to step-down: see transfer orders   LOS: 1 day    Sybol Morre K 05/14/2013

## 2013-05-14 NOTE — Op Note (Signed)
NAMJohna Roles:  Seefeld, Carman          ACCOUNT NO.:  [id number]  MEDICAL RECORD NO.:  [id number]  LOCATION:  2S11C                        FACILITY:  [facility]  PHYSICIAN:  Bedelia PersonLee Gopal Malter, M.D.        DATE OF BIRTH:  06/06/1945  DATE OF PROCEDURE:  05/13/2013 DATE OF DISCHARGE:                              OPERATIVE REPORT   Mr. Boutelle is an elderly male scheduled for aortic valve replacement due to severe aortic stenosis.  The TEE will be used intraoperatively to assess left ventricular function, as well as function of valvular replacement.  The patient has no history of esophageal or gastric pathology.  After induction of general anesthesia, the airway was secured with an oral endotracheal tube.  An orogastric tube was then placed to remove gastric contents which was then removed and replaced with the transesophageal transducer.  It was placed blindly with no significant resistance.  The prebypass examination revealed the left ventricle to show some concentric left ventricular hypertrophy.  There was good contractility with no segmental wall abnormalities.  The aortic valve appeared functionally bicuspid with right and left coronary cusps.  There was heavy calcification of the leaflets.  The anulus was measured at 22 mm, although difficult exact measurement due to the heavy calcification. Color Doppler revealed a moderate aortic insufficiency.  The TI measurement of the valve area was 0.81 cm2.  The T-max was 0.9 cm2 with a mean gradient of 34 mmHg and a peak gradient of 55 mmHg.  The aorta was normal in appearance with no disease noted.  The mitral valve had moderate calcification of the anulus and some calcification of the leaflets with the anterior leaflet  showing more than the posterior leaflet.  Color Doppler revealed a trace central mitral regurgitant flow.  There was a 2-mm gradient measured.  The left atrium was mildly dilated.  The appendage was clean, interatrial septum was  intact.  Right heart examination was basically normal, with trace tricuspid regurgitant flow noted in the Swan-Ganz catheter of the valve.  The patient was placed on cardiopulmonary bypass and underwent placement with a #25 valve which at the completion of the bypass period showed a few air bubbles which were cleared with a left ventricular vent at the bypass. The left ventricle continued good contractility with no segmental defects.  The mitral valve had no change from the prebypass examination. The prosthetic aortic valve appeared to be well seated with all leaflets opening and closing appropriately.  There were no perivalvular leaks. No aortic insufficiency was detected.  The right heart examination was essentially unchanged from the prebypass examination.  There were no other new findings in the post bypass exam.          ______________________________ Bedelia PersonLee Kourtlynn Trevor, M.D.     LK/MEDQ  D:  05/13/2013  T:  05/14/2013  Job:  [id number]

## 2013-05-14 NOTE — Progress Notes (Signed)
UR Completed.  Nicholos Aloisi Jane [phone] 05/14/2013  

## 2013-05-14 NOTE — Progress Notes (Signed)
Pt transferred to 2w23 from 2s; pt SR on monitor; VSS; will cont. To monitor.

## 2013-05-15 LAB — BASIC METABOLIC PANEL
BUN: 13 mg/dL (ref 6–23)
CO2: 22 meq/L (ref 19–32)
Calcium: 7.8 mg/dL — ABNORMAL LOW (ref 8.4–10.5)
Chloride: 102 mEq/L (ref 96–112)
Creatinine, Ser: 0.95 mg/dL (ref 0.50–1.35)
GFR calc Af Amer: >90 mL/min (ref >90)
GFR, EST NON AFRICAN AMERICAN: 83 mL/min — AB (ref >90)
GLUCOSE: 130 mg/dL — AB (ref 70–99)
POTASSIUM: 4.4 meq/L (ref 3.7–5.3)
SODIUM: 137 meq/L (ref 137–147)

## 2013-05-15 LAB — GLUCOSE, CAPILLARY
GLUCOSE-CAPILLARY: 126 mg/dL — AB (ref 70–99)
GLUCOSE-CAPILLARY: 113 mg/dL — AB (ref 70–99)
Glucose-Capillary: 127 mg/dL — ABNORMAL HIGH (ref 70–99)
Glucose-Capillary: 119 mg/dL — ABNORMAL HIGH (ref 70–99)

## 2013-05-15 LAB — POCT I-STAT, CHEM 8
BUN: 16 mg/dL (ref 6–23)
CHLORIDE: 103 meq/L (ref 96–112)
Calcium, Ion: 1.16 mmol/L (ref 1.13–1.30)
Creatinine, Ser: 1 mg/dL (ref 0.50–1.35)
Glucose, Bld: 123 mg/dL — ABNORMAL HIGH (ref 70–99)
HCT: 26 % — ABNORMAL LOW (ref 39.0–52.0)
Hemoglobin: 8.8 g/dL — ABNORMAL LOW (ref 13.0–17.0)
POTASSIUM: 4 meq/L (ref 3.7–5.3)
SODIUM: 139 meq/L (ref 137–147)
TCO2: 25 mmol/L (ref 0–100)

## 2013-05-15 LAB — CBC
HEMATOCRIT: 26 % — AB (ref 39.0–52.0)
HEMOGLOBIN: 9.1 g/dL — AB (ref 13.0–17.0)
MCH: 34 pg (ref 26.0–34.0)
MCHC: 35 g/dL (ref 30.0–36.0)
MCV: 97 fL (ref 78.0–100.0)
Platelets: 58 10*3/uL — ABNORMAL LOW (ref 150–400)
RBC: 2.68 MIL/uL — AB (ref 4.22–5.81)
RDW: 12.2 % (ref 11.5–15.5)
WBC: 13.1 10*3/uL — ABNORMAL HIGH (ref 4.0–10.5)

## 2013-05-15 NOTE — Progress Notes (Signed)
CARDIAC REHAB PHASE I   PRE:  Rate/Rhythm: 94SR  BP:  Supine: 120/78  Sitting:   Standing:    SaO2: 94%RA  MODE:  Ambulation: 550 ft   POST:  Rate/Rhythm: 119ST  BP:  Supine: 142/82  Sitting:   Standing:    SaO2: 94%RA 1350-1423 Pt walked 550 ft on RA with hand held asst. Gait steady.tolerated well. To bed after walk. Did not want recliner at this time. Encouraged third walk with staff.   Luetta Nuttingharlene Jhan Conery, RN BSN  05/15/2013 2:17 PM

## 2013-05-15 NOTE — Progress Notes (Signed)
       [address]       [place] [id number]             [phone]          2 Days Post-Op Procedure(s) (LRB): AORTIC VALVE REPLACEMENT (AVR) (N/A) INTRAOPERATIVE TRANSESOPHAGEAL ECHOCARDIOGRAM (N/A)  Subjective: Feels well, no complaints.   Objective: Vital signs in last 24 hours: Patient Vitals for the past 24 hrs:  BP Temp Temp src Pulse Resp SpO2 Weight  05/15/13 0407 124/76 mmHg 98.5 F (36.9 C) Oral 107 18 93 % 189 lb 9.5 oz (86 kg)  05/14/13 2202 120/78 mmHg - - - - - -  05/14/13 2016 117/78 mmHg 98.5 F (36.9 C) Oral 90 20 97 % -  05/14/13 1700 128/94 mmHg - - 87 21 94 % -  05/14/13 1600 120/69 mmHg - - 83 17 96 % -  05/14/13 1530 - 98.7 F (37.1 C) Oral - - - -  05/14/13 1500 127/61 mmHg - - 85 17 98 % -  05/14/13 1400 128/67 mmHg - - 81 19 97 % -  05/14/13 1300 114/69 mmHg - - 79 17 97 % -  05/14/13 1207 - 98.4 F (36.9 C) Oral - - - -  05/14/13 1200 126/73 mmHg - - 86 15 96 % -  05/14/13 1100 122/77 mmHg - - 98 26 99 % -  05/14/13 1000 127/76 mmHg - - 92 18 95 % -   Current Weight  05/15/13 189 lb 9.5 oz (86 kg)  PRE-OPERATIVE WEIGHT: 84 kg    Intake/Output from previous day: 01/13 0701 - 01/14 0700 In: 618.2 [P.O.:340; I.V.:228.2; IV Piggyback:50] Out: 1050 [Urine:1010; Chest Tube:40]  CBGs [phone]    PHYSICAL EXAM:  Heart: RRR Lungs: Clear Wound: Clean and dry Extremities: No significant LE edema    Lab Results: CBC: Recent Labs  05/14/13 1600 05/15/13 0449  WBC 14.2* 13.1*  HGB 9.6* 9.1*  HCT 26.9* 26.0*  PLT 66* 58*   BMET:  Recent Labs  05/14/13 0400 05/14/13 1600 05/15/13 0449  NA 139  --  137  K 4.2  --  4.4  CL 106  --  102  CO2 21  --  22  GLUCOSE 121*  --  130*  BUN 16  --  13  CREATININE 1.03 0.95 0.95  CALCIUM 7.7*  --  7.8*    PT/INR:  Recent Labs  05/13/13 1207  LABPROT 16.9*  INR 1.41      Assessment/Plan: S/P Procedure(s) (LRB): AORTIC VALVE REPLACEMENT (AVR)  (N/A) INTRAOPERATIVE TRANSESOPHAGEAL ECHOCARDIOGRAM (N/A)  CV- SR, BPs stable. Continue Lopressor, Lisinopril.  Vol overload- diurese  Elevated CBGs- on low dose Levemir, no h/o DM (A1C-5.5).  Will d/c Levemir, continue SSI.  Pulm toilet, ambulation.  Thrombocytopenia- plts decreased further today. Watch closely.  May need to check HIT panel if they continue to drop.   LOS: 2 days    Scotty Weigelt H 05/15/2013

## 2013-05-15 NOTE — Progress Notes (Signed)
2 Days Post-Op Procedure(s) (LRB): AORTIC VALVE REPLACEMENT (AVR) (N/A) INTRAOPERATIVE TRANSESOPHAGEAL ECHOCARDIOGRAM (N/A) Subjective: No complaints.  Ambulating. Wants to go home  Objective: Vital signs in last 24 hours: Temp:  [98.4 F (36.9 C)-98.7 F (37.1 C)] 98.5 F (36.9 C) (01/14 0407) Pulse Rate:  [79-107] 107 (01/14 0407) Cardiac Rhythm:  [-] Normal sinus rhythm (01/13 2005) Resp:  [15-26] 18 (01/14 0407) BP: (114-128)/(61-94) 124/76 mmHg (01/14 0407) SpO2:  [93 %-99 %] 93 % (01/14 0407) Weight:  [86 kg (189 lb 9.5 oz)] 86 kg (189 lb 9.5 oz) (01/14 0407)  Hemodynamic parameters for last 24 hours:    Intake/Output from previous day: 01/13 0701 - 01/14 0700 In: 618.2 [P.O.:340; I.V.:228.2; IV Piggyback:50] Out: 1050 [Urine:1010; Chest Tube:40] Intake/Output this shift:    General appearance: alert and cooperative Neurologic: intact Heart: regular rate and rhythm, S1, S2 normal, no murmur, click, rub or gallop Lungs: clear to auscultation bilaterally Extremities: extremities normal, atraumatic, no cyanosis or edema Wound: incision ok  Lab Results:  Recent Labs  05/14/13 1600 05/15/13 0449  WBC 14.2* 13.1*  HGB 9.6* 9.1*  HCT 26.9* 26.0*  PLT 66* 58*   BMET:  Recent Labs  05/14/13 0400 05/14/13 1600 05/15/13 0449  NA 139  --  137  K 4.2  --  4.4  CL 106  --  102  CO2 21  --  22  GLUCOSE 121*  --  130*  BUN 16  --  13  CREATININE 1.03 0.95 0.95  CALCIUM 7.7*  --  7.8*    PT/INR:  Recent Labs  05/13/13 1207  LABPROT 16.9*  INR 1.41   ABG    Component Value Date/Time   PHART 7.350 05/13/2013 1714   HCO3 21.6 05/13/2013 1714   TCO2 21 05/13/2013 1826   ACIDBASEDEF 3.0* 05/13/2013 1714   O2SAT 95.0 05/13/2013 1714   CBG (last 3)   Recent Labs  05/14/13 1650 05/14/13 2130 05/15/13 0618  GLUCAP 77 153* 127*    Assessment/Plan: S/P Procedure(s) (LRB): AORTIC VALVE REPLACEMENT (AVR) (N/A) INTRAOPERATIVE TRANSESOPHAGEAL ECHOCARDIOGRAM  (N/A) He is doing well 2 days after AVR with a tissue valve. Weight is a couple pounds over preop so will diurese a little.  Thrombocytopenia: platelet count only 130 preop and dropped in the OR. I don't think this is HIT but may be related to heparin and bypass. This should resolve.  Continue IS and ambulation  Plan home Friday if nothing changes.   LOS: 2 days    Evelene CroonBARTLE,Quilla Freeze K 05/15/2013

## 2013-05-15 NOTE — Progress Notes (Signed)
Pt amb 47300ft around unit with one assist. Pt did not have any complaints. Will continue to monitor pt closely.

## 2013-05-16 ENCOUNTER — Encounter (HOSPITAL_COMMUNITY): Payer: Self-pay | Admitting: Surgery

## 2013-05-16 DIAGNOSIS — E785 Hyperlipidemia, unspecified: Secondary | ICD-10-CM

## 2013-05-16 DIAGNOSIS — I519 Heart disease, unspecified: Secondary | ICD-10-CM

## 2013-05-16 DIAGNOSIS — I359 Nonrheumatic aortic valve disorder, unspecified: Principal | ICD-10-CM

## 2013-05-16 LAB — CBC
HCT: 25.8 % — ABNORMAL LOW (ref 39.0–52.0)
Hemoglobin: 9.1 g/dL — ABNORMAL LOW (ref 13.0–17.0)
MCH: 34.2 pg — ABNORMAL HIGH (ref 26.0–34.0)
MCHC: 35.3 g/dL (ref 30.0–36.0)
MCV: 97 fL (ref 78.0–100.0)
PLATELETS: 65 10*3/uL — AB (ref 150–400)
RBC: 2.66 MIL/uL — ABNORMAL LOW (ref 4.22–5.81)
RDW: 12 % (ref 11.5–15.5)
WBC: 9.7 10*3/uL (ref 4.0–10.5)

## 2013-05-16 LAB — GLUCOSE, CAPILLARY
GLUCOSE-CAPILLARY: 112 mg/dL — AB (ref 70–99)
GLUCOSE-CAPILLARY: 96 mg/dL (ref 70–99)

## 2013-05-16 MED ORDER — LACTULOSE 10 GM/15ML PO SOLN
20.0000 g | Freq: Once | ORAL | Status: DC
  Filled 2013-05-16: qty 30

## 2013-05-16 MED ORDER — FERROUS SULFATE 325 (65 FE) MG PO TABS
325.0000 mg | ORAL_TABLET | Freq: Every day | ORAL | Status: DC
  Administered 2013-05-16 – 2013-05-17 (×2): 325 mg via ORAL
  Filled 2013-05-16 (×3): qty 1

## 2013-05-16 NOTE — Progress Notes (Signed)
EPW removed per protocol. Ends intact. VSS. Pt instructed to remain in bed for one hour. Pt verbalized understanding. Will continue to monitor pt closely.

## 2013-05-16 NOTE — Discharge Instructions (Signed)
Aortic Valve Replacement °Care After °Refer to this sheet in the next few weeks. These instructions provide you with information on caring for yourself after your procedure. Your caregiver may also give you specific instructions. Your treatment has been planned according to current medical practices, but problems sometimes occur. Call your caregiver if you have any problems or questions after your procedure. °HOME CARE INSTRUCTIONS  °· Only take over-the-counter or prescription medicines as directed by your caregiver. °· Take your temperature every morning for the first 7 days after surgery. Write these down. Call your caregiver if your temperature stays above 100° F (37.8° C) for more than a day.   °· Weigh yourself every morning for at least 7 days after surgery. Write your weight down to monitor any weight increase. °· Wear elastic stockings during the day for at least 2 weeks after surgery. Use them longer if your ankles are swollen. The stockings help blood flow and help reduce swelling in the legs.  °· Take frequent naps or rest often throughout the day. °· Avoid lifting over 10 lbs (4.5 kg) or pushing or pulling things with your arms for 6 8 weeks or as directed by your caregiver. °· Avoid driving or airplane travel for 4 6 weeks after surgery or as directed. If you are riding in a car for an extended period, stop every 1 2 hours to stretch your legs. Keep a record of your medicines and medical history with you when traveling. °· Do not cross your legs. °· Do not take baths for 4 6 weeks after surgery. Take showers once your caregiver approves. Pat incisions dry. Do not rub incisions with a washcloth or towel. °· Avoid climbing stairs and using the handrail to pull yourself up for the first 2 3 weeks after surgery. °· Return to work as directed by your caregiver. °· Drink enough fluids to keep your urine clear or pale yellow. °· Do not strain to have a bowel movement. Eat high-fiber foods if you become  constipated. You may also take a medicine to help you have a bowel movement (laxative) as directed by your caregiver. °· Resume sexual activity as directed by your caregiver. Men should not use medicines for erectile dysfunction until their doctor says it is okay. °· If you had a certain type of heart condition in the past, you may need to take antibiotic medicine before having dental work or surgery. Let your dentist and caregivers know if you had one or more of the following: °· Previous endocarditis. °· An artificial (prosthetic) heart valve. °· Congenital heart disease. °SEEK MEDICAL CARE IF: °· You develop a skin rash.   °· Your weight is increasing each day over 2 3 days, or you have a sudden weight gain. °· Your weight increases by 2 or more pounds (1 kg or more) in a single day. °SEEK IMMEDIATE MEDICAL CARE IF:  °· You develop chest pain that is not coming from your incision.   °· You develop shortness of breath or have difficulty breathing.   °· You have a fever.   °· You have increased bleeding from your wounds.   °· You have increasing wound pain.   °· You have redness or swelling around your wounds °· You have pus coming from your wound.   °· You develop lightheadedness.   °MAKE SURE YOU:  °· Understand these directions. °· Will watch your condition. °· Will get help right away if you are not doing well or get worse. °Document Released: 11/04/2004 Document Revised: 04/04/2012 Document Reviewed: 01/31/2012 °[employer]® Patient   Information ©2014 [employer]. ° °

## 2013-05-16 NOTE — Discharge Summary (Signed)
Physician Discharge Summary       [address] [place]       [place] [address]             [phone]    Patient ID: Drew Andrews MRN: [id number] DOB/AGE: 69/01/1946 69 y.o.  Admit date: 05/13/2013 Discharge date: 05/17/2013  Admission Diagnoses: 1. Severe aortic stenosis 2.History of hypertension 3.History of hyperlipidemia 4.History of tobacco abuse 5.History of GERD 6.History of seasonal allergies  Discharge Diagnoses:  1. Severe aortic stenosis 2.History of hypertension 3.History of hyperlipidemia 4.History of tobacco abuse 5.History of GERD 6.History of seasonal allergies 7. ABL anemia 8.Thrombocytopenia  Procedure (s):  1. Median Sternotomy 2. Extracorporeal circulation 3. Aortic valve replacement using a 25 mm Edwards Magna-Ease Pericardial valve by Dr. Laneta Simmers on 05/13/2013.  History of Presenting Illness: The patient is a 68 year old active gentleman with a long history of a heart murmur dating back to the 1980's and known aortic stenosis that has been followed by Dr. Tresa Endo with serial echocardiograms. Over the past 9 years, the aortic stenosis has progressed and in Sept. 2013 the mean gradient was 41 mm Hg. He remained asymptomatic, exercising hard 7 days per week. His most recent echo shows severe AS with a mean gradient of 47 mm Hg and a peak gradient of 86 mm Hg with an AVA of 0.63 cm2. Over the past month or so, he reports some discomfort across his upper chest when riding his bike up a hill. This resolves with rest. He denies unusual dyspnea or dizziness. He has had no symptoms with activity on level ground. Cardiac cath shows no significant coronary disease. Dr. Laneta Simmers discussed the need for aortic valve replacement surgery. Potential risks, benefits, and complications were discussed with the patient and he agreed to proceed with surgery.Pre operative carotid duplex US showed no significant carotid artery stenosis bilaterally. He underwent an AVR  on 05/13/2013.  Brief Hospital Course:  The patient was extubated the evening of surgery without difficulty. He remained afebrile and hemodynamically stable. Theone Murdoch, a line, chest tubes, and foley were removed early in the post operative course. Lopressor was started and titrated accordingly. He was volume over loaded and diuresed. He was weaned off the insulin drip. The patient's HGA1C pre op was 5.5. He did have ABL anemia. His last H and H was 9.1 and 25.8. He did not require a transfusion and was started on Ferrous sulfate.He also had thrombocytopenia. His last platelets were up to 84,000. The patient was felt surgically stable for transfer from the ICU to PCTU for further convalescence on 69/13/2014. He continues to progress with cardiac rehab. He was ambulating on room air. He has been tolerating a diet and has had a bowel movement. Epicardial pacing wires and chest tube sutures will be removed prior to discharge. Provided the patient remains afebrile, hemodynamically stable, and pending morning round evaluation, he will be surgically stable for discharge on 69/16/2014.  Latest Vital Signs: Blood pressure 113/75, pulse 89, temperature 98.1 F (36.7 C), temperature source Oral, resp. rate 18, height 5\' 9"  (1.753 m), weight 81.9 kg (180 lb 8.9 oz), SpO2 100.00%.  Physical Exam: Cardiovascular: RRR, no murmurs, gallops, or rubs.  Pulmonary: Clear to auscultation bilaterally; no rales, wheezes, or rhonchi.  Abdomen: Soft, non tender, bowel sounds present.  Extremities: No lower extremity edema.  Wound: Clean and dry. No erythema or signs of infection.   Discharge Condition:Stable  Recent laboratory studies:  Lab Results  Component Value  Date   WBC 6.7 05/17/2013   HGB 9.4* 05/17/2013   HCT 27.3* 05/17/2013   MCV 96.8 05/17/2013   PLT 84* 05/17/2013   Lab Results  Component Value Date   NA 137 05/15/2013   K 4.4 05/15/2013   CL 102 05/15/2013   CO2 22 05/15/2013   CREATININE 0.95  05/15/2013   GLUCOSE 130* 05/15/2013     Diagnostic Studies:   Dg Chest Portable 1 View In Am  05/14/2013   CLINICAL DATA:  Aortic valve replacement.  EXAM: PORTABLE CHEST - 1 VIEW  COMPARISON:  05/13/2013 .  FINDINGS: Interim extubation. Swan-Ganz catheter tip noted projected over the right main pulmonary artery. Mediastinal drainage catheter in stable position. Mild basilar atelectasis present present. Cardiomegaly. Prior aortic valve replacement. No congestive heart failure. No pneumothorax.  IMPRESSION: 1. Interim extubation.  Support lines and tubes in stable position. 2. Mild bibasilar atelectasis . 3. Stable cardiomegaly. No congestive heart failure. Patient status post aortic valve replacement.   Electronically Signed   By: Maisie Fushomas  Register   On: 05/14/2013 08:50    Discharge Orders   Future Appointments Provider Department Dept Phone   05/31/2013 8:20 AM Nada BoozerLaura Ingold, NP [facility] [phone]   06/05/2013 12:30 PM Alleen BorneBryan K Bartle, MD [facility] [phone]   Future Orders Complete By Expires   Amb Referral to Cardiac Rehabilitation  As directed       Discharge Medications:    Medication List         ADVIL PM PO  Take 2-3 tablets by mouth at bedtime as needed (for sleep).     aspirin 325 MG EC tablet  Take 1 tablet (325 mg total) by mouth daily.     ferrous sulfate 325 (65 FE) MG tablet  Take 1 tablet (325 mg total) by mouth daily with breakfast.     lisinopril 5 MG tablet  Commonly known as:  PRINIVIL,ZESTRIL  Take 5 mg by mouth at bedtime.     metoprolol tartrate 25 MG tablet  Commonly known as:  LOPRESSOR  Take 1 tablet (25 mg total) by mouth 2 (two) times daily.     omeprazole 20 MG tablet  Commonly known as:  PRILOSEC OTC  Take 20 mg by mouth daily.     oxyCODONE 5 MG immediate release tablet  Commonly known as:  Oxy IR/ROXICODONE  Take 1-2 tablets (5-10 mg total) by mouth every 3 (three) hours as  needed for severe pain.     traMADol 50 MG tablet  Commonly known as:  ULTRAM  Take 1-2 tablets (50-100 mg total) by mouth every 4 (four) hours as needed for moderate pain.     VYTORIN 10-40 MG per tablet  Generic drug:  ezetimibe-simvastatin  Take 1 tablet by mouth at bedtime.          The patient has been discharged on:   1.Beta Blocker:  Yes [ x  ]                              No   [   ]                              If No, reason:  2.Ace Inhibitor/ARB: Yes [ x  ]  No  [    ]                                     If No, reason:  3.Statin:   Yes [x   ]                  No  [   ]                  If No, reason:  4.Ecasa:  Yes  [ x  ]                  No   [  ]                  If No, reason:  Follow Up Appointments: Follow-up Information   Follow up with Lennette Bihari, MD On 06/05/2013. (Appointment is with Nada Boozer and time is 8:20 am)    Specialty:  Cardiology   Contact information:   [address] [place] [address] [phone]       Follow up with Alleen Borne, MD On 06/05/2013. (PA/LAT CXR to be taken (at [facility] which is in the same building as Dr. Sharee Pimple office) on 06/05/2013 at 11:30 am;Appointment with Dr. Laneta Simmers is at 12:30 pm)    Specialty:  Cardiothoracic Surgery   Contact information:   [address] [place] [address] [phone]       Signed: Doree Fudge MPA-C 05/17/2013, 7:37 AM

## 2013-05-16 NOTE — Progress Notes (Addendum)
      [address]       [place] [id number]             [phone]        3 Days Post-Op Procedure(s) (LRB): AORTIC VALVE REPLACEMENT (AVR) (N/A) INTRAOPERATIVE TRANSESOPHAGEAL ECHOCARDIOGRAM (N/A)  Subjective: Patient without complaints. He is passing flatus, but no bowel movement yet. He ate prunes this am.  Objective: Vital signs in last 24 hours: Temp:  [98 F (36.7 C)-100.6 F (38.1 C)] 99.8 F (37.7 C) (01/15 0659) Pulse Rate:  [86-104] 86 (01/15 0533) Cardiac Rhythm:  [-]  Resp:  [18] 18 (01/15 0533) BP: (104-119)/(65-78) 116/68 mmHg (01/15 0533) SpO2:  [95 %-97 %] 95 % (01/15 0533) Weight:  [82.736 kg (182 lb 6.4 oz)] 82.736 kg (182 lb 6.4 oz) (01/15 0533)  Pre op weight 84 kg Current Weight  05/16/13 82.736 kg (182 lb 6.4 oz)     Intake/Output from previous day: 01/14 0701 - 01/15 0700 In: 600 [P.O.:600] Out: -    Physical Exam:  Cardiovascular: RRR, no murmurs, gallops, or rubs. Pulmonary: Clear to auscultation bilaterally; no rales, wheezes, or rhonchi. Abdomen: Soft, non tender, bowel sounds present. Extremities: No lower extremity edema. Wound: Clean and dry.  No erythema or signs of infection.  Lab Results: CBC: Recent Labs  05/15/13 0449 05/16/13 0551  WBC 13.1* 9.7  HGB 9.1* 9.1*  HCT 26.0* 25.8*  PLT 58* 65*   BMET:  Recent Labs  05/14/13 0400  05/14/13 1657 05/15/13 0449  NA 139  --  139 137  K 4.2  --  4.0 4.4  CL 106  --  103 102  CO2 21  --   --  22  GLUCOSE 121*  --  123* 130*  BUN 16  --  16 13  CREATININE 1.03  < > 1.00 0.95  CALCIUM 7.7*  --   --  7.8*  < > = values in this interval not displayed.  PT/INR:  Lab Results  Component Value Date   INR 1.41 05/13/2013   INR 0.98 05/09/2013   INR 0.99 03/12/2013   ABG:  INR: Will add last result for INR, ABG once components are confirmed Will add last 4 CBG results once components are confirmed  Assessment/Plan:  1. CV - SR. On Lopressor 25 bid  and Lisinopril 5 daily. 2.  Pulmonary - Encourage incentive spirometer 3. Volume Overload - On Lasix 40 daily. Will likely not need diuresis upon discharge as is below pre op weight. 4.  Acute blood loss anemia - H adn H stable at 9.1 and 25.8 5.Low grade fever. WBC 9700. No signs of wound infection and no GU complaints. Likely etiology is atelectasis 6.Thrombocytopenia-platelets up to 65,000 7. Remove EPW 8. CBGs 119/113/112. Pre op HGA1C 5.5. Stop accu checks. 9.Hope to discharge in am  ZIMMERMAN,DONIELLE MPA-C 05/16/2013,8:48 AM   Chart reviewed, patient examined, agree with above.

## 2013-05-16 NOTE — Progress Notes (Signed)
   Subjective:  Day 3 s/p AVR; no chest pain; no dyspnea.  Objective:   Vital Signs in the last 24 hours: Temp:  [98 F (36.7 C)-100.6 F (38.1 C)] 99.4 F (37.4 C) (01/15 1400) Pulse Rate:  [82-104] 82 (01/15 1400) Resp:  [18-20] 20 (01/15 1400) BP: (104-116)/(64-78) 106/73 mmHg (01/15 1400) SpO2:  [95 %-100 %] 100 % (01/15 1400) Weight:  [182 lb 6.4 oz (82.736 kg)] 182 lb 6.4 oz (82.736 kg) (01/15 0533)  Intake/Output from previous day: 01/14 0701 - 01/15 0700 In: 600 [P.O.:600] Out: -   Medications: . docusate sodium  200 mg Oral Daily  . ezetimibe-simvastatin  1 tablet Oral QHS  . famotidine  20 mg Oral BID  . ferrous sulfate  325 mg Oral Q breakfast  . lisinopril  5 mg Oral QHS  . metoprolol tartrate  25 mg Oral BID  . sodium chloride  3 mL Intravenous Q12H       Physical Exam:   General appearance: alert, cooperative and no distress Neck: no JVD and supple, symmetrical, trachea midline Lungs: clear to auscultation bilaterally Heart: RRR 1/6 SEM, No  AR, no rub Abdomen: soft, non-tender; bowel sounds normal; no masses,  no organomegaly Extremities: no edema, redness or tenderness in the calves or thighs Skin: Skin color, texture, turgor normal. No rashes or lesions Neurologic: Grossly normal   Rate: 80  Rhythm: normal sinus rhythm  Lab Results:    Recent Labs  05/14/13 0400  05/14/13 1657 05/15/13 0449  NA 139  --  139 137  K 4.2  --  4.0 4.4  CL 106  --  103 102  CO2 21  --   --  22  GLUCOSE 121*  --  123* 130*  BUN 16  --  16 13  CREATININE 1.03  < > 1.00 0.95  < > = values in this interval not displayed. No results found for this basename: TROPONINI, CK, MB,  in the last 72 hours Hepatic Function Panel No results found for this basename: PROT, ALBUMIN, AST, ALT, ALKPHOS, BILITOT, BILIDIR, IBILI,  in the last 72 hours No results found for this basename: INR,  in the last 72 hours BNP (last 3 results) No results found for this basename:  PROBNP,  in the last 8760 hours  Lipid Panel     Component Value Date/Time   CHOL 176 03/12/2013 0931   TRIG 117 03/12/2013 0931   HDL 63 03/12/2013 0931   CHOLHDL 2.8 03/12/2013 0931   VLDL 23 03/12/2013 0931   LDLCALC 90 03/12/2013 0931   CBC    Component Value Date/Time   WBC 9.7 05/16/2013 0551   RBC 2.66* 05/16/2013 0551   HGB 9.1* 05/16/2013 0551   HCT 25.8* 05/16/2013 0551   PLT 65* 05/16/2013 0551   MCV 97.0 05/16/2013 0551   MCH 34.2* 05/16/2013 0551   MCHC 35.3 05/16/2013 0551   RDW 12.0 05/16/2013 0551      Imaging:  No results found.    Assessment/Plan:   Active Problems:   S/P AVR  Doing well s/p AVR with tissue valve for severe AS. No shortness of breath. Thrombocytopenic with plt 65K improved from a nadir of 58 K. I/O -1880 since admission. Stable hemodynamics. Using incentive spirometer. For possible dc tomorrow; schedule patient for f/u OV with me in ~ 3 -4  Weeks.    Lennette Biharihomas A. Kelly, MD, [facility] 05/16/2013, 3:07 PM

## 2013-05-16 NOTE — Progress Notes (Signed)
CARDIAC REHAB PHASE I   PRE:  Rate/Rhythm: Sinus 88  BP:    Sitting: 118/60     SaO2: 99% Room air  MODE:  Ambulation: 700 ft   POST:  Rate/Rhythem: 97% Room Air  BP:    Sitting: 118/70     SaO2: 96% Room air  1500-1535 Patient ambulated in hallway without difficulty or complaints. Sternal precautions and exercise instructions reviewed. Drew Andrews is interested in outpatient cardiac rehab.   Whitaker, Arta BruceMaria Walden RN BSN

## 2013-05-17 LAB — CBC
HCT: 27.3 % — ABNORMAL LOW (ref 39.0–52.0)
Hemoglobin: 9.4 g/dL — ABNORMAL LOW (ref 13.0–17.0)
MCH: 33.3 pg (ref 26.0–34.0)
MCHC: 34.4 g/dL (ref 30.0–36.0)
MCV: 96.8 fL (ref 78.0–100.0)
PLATELETS: 84 10*3/uL — AB (ref 150–400)
RBC: 2.82 MIL/uL — AB (ref 4.22–5.81)
RDW: 11.9 % (ref 11.5–15.5)
WBC: 6.7 10*3/uL (ref 4.0–10.5)

## 2013-05-17 MED ORDER — ASPIRIN EC 325 MG PO TBEC
325.0000 mg | DELAYED_RELEASE_TABLET | Freq: Every day | ORAL | Status: DC
  Filled 2013-05-17: qty 1

## 2013-05-17 MED ORDER — FERROUS SULFATE 325 (65 FE) MG PO TABS: 325.0000 mg | ORAL_TABLET | Freq: Every day | ORAL | Status: DC

## 2013-05-17 MED ORDER — TRAMADOL HCL 50 MG PO TABS: 50.0000 mg | ORAL_TABLET | ORAL | Status: DC | PRN

## 2013-05-17 MED ORDER — OXYCODONE HCL 5 MG PO TABS: 5.0000 mg | ORAL_TABLET | ORAL | Status: DC | PRN

## 2013-05-17 MED ORDER — ASPIRIN 325 MG PO TBEC: 325.0000 mg | DELAYED_RELEASE_TABLET | Freq: Every day | ORAL | Status: DC

## 2013-05-17 MED ORDER — METOPROLOL TARTRATE 25 MG PO TABS: 25.0000 mg | ORAL_TABLET | Freq: Two times a day (BID) | ORAL | Status: DC

## 2013-05-17 NOTE — Progress Notes (Signed)
[phone] [id number] Pt ready to go home. Getting dressed as education completed. Wife attentive and pt stated he has watched post op video twice and has had his questions answered. Education completed. Referring to CRP 2 [place]. Encouraged watching sodium and gave heart healthy diet for information. Luetta NuttingCharlene Jourdan Durbin RN BSN 05/17/2013 9:37 AM

## 2013-05-17 NOTE — Progress Notes (Addendum)
      [address]       [place] [id number]             [phone]      4 Days Post-Op Procedure(s) (LRB): AORTIC VALVE REPLACEMENT (AVR) (N/A) INTRAOPERATIVE TRANSESOPHAGEAL ECHOCARDIOGRAM (N/A)  Subjective:  Mr. Drew Andrews has no complaints this morning.  He is ambulating without difficulty.  Ready to go home.  + BM  Objective: Vital signs in last 24 hours: Temp:  [98.1 F (36.7 C)-99.4 F (37.4 C)] 98.1 F (36.7 C) (01/16 0549) Pulse Rate:  [82-89] 89 (01/16 0549) Cardiac Rhythm:  [-]  Resp:  [18-20] 18 (01/16 0549) BP: (106-124)/(64-75) 113/75 mmHg (01/16 0549) SpO2:  [95 %-100 %] 100 % (01/16 0549) Weight:  [180 lb 8.9 oz (81.9 kg)] 180 lb 8.9 oz (81.9 kg) (01/16 0549)  Intake/Output from previous day: 01/15 0701 - 01/16 0700 In: 360 [P.O.:360] Out: -   General appearance: alert, cooperative and no distress Heart: regular rate and rhythm Lungs: clear to auscultation bilaterally Abdomen: soft, non-tender; bowel sounds normal; no masses,  no organomegaly Extremities: edema trace Wound: clean and dry  Lab Results:  Recent Labs  05/16/13 0551 05/17/13 0401  WBC 9.7 6.7  HGB 9.1* 9.4*  HCT 25.8* 27.3*  PLT 65* 84*   BMET:  Recent Labs  05/14/13 1657 05/15/13 0449  NA 139 137  K 4.0 4.4  CL 103 102  CO2  --  22  GLUCOSE 123* 130*  BUN 16 13  CREATININE 1.00 0.95  CALCIUM  --  7.8*    PT/INR: No results found for this basename: LABPROT, INR,  in the last 72 hours ABG    Component Value Date/Time   PHART 7.350 05/13/2013 1714   HCO3 21.6 05/13/2013 1714   TCO2 25 05/14/2013 1657   ACIDBASEDEF 3.0* 05/13/2013 1714   O2SAT 95.0 05/13/2013 1714   CBG (last 3)   Recent Labs  05/15/13 2218 05/16/13 0634 05/16/13 2202  GLUCAP 113* 112* 96    Assessment/Plan: S/P Procedure(s) (LRB): AORTIC VALVE REPLACEMENT (AVR) (N/A) INTRAOPERATIVE TRANSESOPHAGEAL ECHOCARDIOGRAM (N/A)  1. CV- NSR good rate and pressure control- continue  Lopressor, Lisinopril 2. Pulm- no acute issues, continue IS 3. Renal- patient's weight is below baseline, Lasix has been completed 4. Thrombocytopenia- improving, platelets up to 84 5. Dispo- patient is medically stable, will d/c home today   LOS: 4 days    Raford PitcherBARRETT, Ruby Logiudice 05/17/2013

## 2013-05-20 ENCOUNTER — Ambulatory Visit (INDEPENDENT_AMBULATORY_CARE_PROVIDER_SITE_OTHER): Payer: Self-pay | Admitting: Physician Assistant

## 2013-05-20 VITALS — BP 128/75 | HR 93 | Temp 100.7°F | Resp 20 | Ht 69.0 in | Wt 180.0 lb

## 2013-05-20 DIAGNOSIS — Z952 Presence of prosthetic heart valve: Secondary | ICD-10-CM

## 2013-05-20 DIAGNOSIS — I350 Nonrheumatic aortic (valve) stenosis: Secondary | ICD-10-CM

## 2013-05-20 DIAGNOSIS — M7030 Other bursitis of elbow, unspecified elbow: Secondary | ICD-10-CM

## 2013-05-20 DIAGNOSIS — M7020 Olecranon bursitis, unspecified elbow: Secondary | ICD-10-CM

## 2013-05-20 DIAGNOSIS — I359 Nonrheumatic aortic valve disorder, unspecified: Secondary | ICD-10-CM

## 2013-05-20 DIAGNOSIS — Z954 Presence of other heart-valve replacement: Secondary | ICD-10-CM

## 2013-05-20 HISTORY — DX: Other bursitis of elbow, unspecified elbow: M70.30

## 2013-05-20 NOTE — Progress Notes (Signed)
  HPI: Patient returns for post op check.  He is S/P AVR done 05/13/2013.  He was discharged home on 05/17/2013 after an uncomplicated hospital stay.  The patient presents with multiple complaints.  First he is complaining of a rash on his back.  The rash is healing and does not itch.  Second he complains of elbow pain.  He states his left elbow is red and swollen.  It is tender, but overall he feels its improving.  Third he has a fever of 99, however here in the office his temperature is 100.7.  Finally he complains of burning and pins and needle type feeling in his right thigh.  His surgical wounds are healing with no evidence of infection.  He denies dysuria.  He is ambulating with minimal difficulty and denies shortness of breath.   Current Outpatient Prescriptions  Medication Sig Dispense Refill  . aspirin EC 325 MG EC tablet Take 1 tablet (325 mg total) by mouth daily.  30 tablet  0  . ferrous sulfate 325 (65 FE) MG tablet Take 1 tablet (325 mg total) by mouth daily with breakfast.  30 tablet  0  . Ibuprofen-Diphenhydramine Cit (ADVIL PM PO) Take 2-3 tablets by mouth at bedtime as needed (for sleep).       [name]. lisinopril (PRINIVIL,ZESTRIL) 5 MG tablet Take 5 mg by mouth at bedtime.       . metoprolol tartrate (LOPRESSOR) 25 MG tablet Take 1 tablet (25 mg total) by mouth 2 (two) times daily.  60 tablet  0  . omeprazole (PRILOSEC OTC) 20 MG tablet Take 20 mg by mouth daily.       . traMADol (ULTRAM) 50 MG tablet Take 1-2 tablets (50-100 mg total) by mouth every 4 (four) hours as needed for moderate pain.  30 tablet  0  . VYTORIN 10-40 MG per tablet Take 1 tablet by mouth at bedtime.        No current facility-administered medications for this visit.    Physical Exam:  BP 128/75  Pulse 93  Temp(Src) 100.7 F (38.2 C) (Oral)  Resp 20  Ht 5\' 9"  (1.753 m)  Wt 180 lb (81.647 kg)  BMI 26.57 kg/m2  SpO2 98%  Gen: no apparent distress Heart: RRR Lungs: CTA bilaterally Incisions: clean and  dry Extremities: Left elbow is warm, erythematous, and appears swollen with fluid collection Skin- old rash, lesions have scabbed over, isolated to back Right Groin- no evidence of ecchymosis, or hematoma present  1. S/P AVR- from cardiac standpoint doing well 2. Left Elbow Bursitis- I feel this is likely source of low grade temperature, I instructed the patient that this should be treated today.  He was instructed to report to [facility] for prompt treatment of this, he was in agreement with this plan 3. Rash- most likely a contact dermatitis- encouraged patient to use Hydrocortisone cream prn  4. Neuropathic pain right thigh- most likely due to acute nerve injury from cardiac catheterization, should improve with time 5. RTC as scheduled with Dr. Laneta SimmersBartle, instructed patient to contact our office sooner should further issues arise.

## 2013-05-24 ENCOUNTER — Encounter: Payer: Self-pay | Admitting: *Deleted

## 2013-05-30 ENCOUNTER — Telehealth: Payer: Self-pay | Admitting: *Deleted

## 2013-05-30 NOTE — Telephone Encounter (Signed)
Returned cardiac rehab order.  

## 2013-05-31 ENCOUNTER — Encounter: Payer: Self-pay | Admitting: Cardiology

## 2013-05-31 ENCOUNTER — Ambulatory Visit (INDEPENDENT_AMBULATORY_CARE_PROVIDER_SITE_OTHER): Payer: Medicare Other | Admitting: Cardiology

## 2013-05-31 VITALS — BP 106/66 | HR 71 | Ht 69.5 in | Wt 186.6 lb

## 2013-05-31 DIAGNOSIS — R21 Rash and other nonspecific skin eruption: Secondary | ICD-10-CM

## 2013-05-31 DIAGNOSIS — I2510 Atherosclerotic heart disease of native coronary artery without angina pectoris: Secondary | ICD-10-CM

## 2013-05-31 DIAGNOSIS — M7030 Other bursitis of elbow, unspecified elbow: Secondary | ICD-10-CM

## 2013-05-31 DIAGNOSIS — M7020 Olecranon bursitis, unspecified elbow: Secondary | ICD-10-CM

## 2013-05-31 DIAGNOSIS — Z954 Presence of other heart-valve replacement: Secondary | ICD-10-CM

## 2013-05-31 DIAGNOSIS — I359 Nonrheumatic aortic valve disorder, unspecified: Secondary | ICD-10-CM

## 2013-05-31 DIAGNOSIS — I350 Nonrheumatic aortic (valve) stenosis: Secondary | ICD-10-CM

## 2013-05-31 DIAGNOSIS — Z952 Presence of prosthetic heart valve: Secondary | ICD-10-CM

## 2013-05-31 NOTE — Progress Notes (Signed)
05/31/2013   PCP: Gaspar Garbe, MD   Chief Complaint  Patient presents with  . post hospital    CABG    Primary Cardiologist: Dr. Tresa Endo  HPI:  68 year old WMM presents today post AVR 05/13/13 by Dr. Laneta Simmers with 25 mm [facility] pericardial valve for severely calcified aortic valve with reduced excursion and trivial aortic insufficiency. Echocardiographic documentation of severe aortic valve stenosis with a mean gradient of 47, a peak instantaneous gradient of 86, and a calculated aortic valve area by echocardiography of 0.63 cm.  Coronary anatomy on cath with 20% smooth LAD stenosis but smooth, otherwise patent coronary arteries.  Was noted to have mild Pulmonary hypertension.   He did well post op without any arrhthymias.  He was anemic and was placed on iron.  On follow up he did have a contact dermatitis on his back that has resolved.  Also bursitis of Lt elbow with inflammation requiring visit to Ortho for drainage.  Now much improved.   He also had burning and pins and needles in his rt. thigh though ambulating without difficulty, felt to be nerve irritation from the cath this has improved as well.  Pt complains of feeling cold- we discussed most likely BB and anemia and he admits he is not feeling as cold as he did when first arrived home.  He is eating well. Ambulating and plans to proceed with cardiac rehab. I answered questions for the pt. as well.   Allergies  Allergen Reactions  . Scopolamine     seizure    Current Outpatient Prescriptions  Medication Sig Dispense Refill  . aspirin EC 325 MG EC tablet Take 1 tablet (325 mg total) by mouth daily.  30 tablet  0  . ferrous sulfate 325 (65 FE) MG tablet Take 1 tablet (325 mg total) by mouth daily with breakfast.  30 tablet  0  . Ibuprofen-Diphenhydramine Cit (ADVIL PM PO) Take 2-3 tablets by mouth at bedtime as needed (for sleep).       [name] lisinopril (PRINIVIL,ZESTRIL) 5 MG tablet Take 5 mg by mouth  at bedtime.       . metoprolol tartrate (LOPRESSOR) 25 MG tablet Take 1 tablet (25 mg total) by mouth 2 (two) times daily.  60 tablet  0  . omeprazole (PRILOSEC OTC) 20 MG tablet Take 20 mg by mouth daily.       . traMADol (ULTRAM) 50 MG tablet Take 1-2 tablets (50-100 mg total) by mouth every 4 (four) hours as needed for moderate pain.  30 tablet  0  . VYTORIN 10-40 MG per tablet Take 1 tablet by mouth at bedtime.        No current facility-administered medications for this visit.    Past Medical History  Diagnosis Date  . Seasonal allergies   . GERD (gastroesophageal reflux disease)   . LVH (left ventricular hypertrophy)     with aortic stenosis-bicuspid  . Hyperlipidemia   . Hypertension   . PONV (postoperative nausea and vomiting)     as a child  . S/P AVR (aortic valve replacement) 05/13/2013    Past Surgical History  Procedure Laterality Date  . Appendectomy  age 8  . Tonsillectomy    . Hiatal hernia repair    . Colonoscopy      X 2  . Cardiac catheterization    . Aortic valve replacement N/A 05/13/2013    Procedure: AORTIC VALVE REPLACEMENT (AVR);  Surgeon: Alleen BorneBryan K Bartle, MD;  [facility];  Service: Open Heart Surgery;  Laterality: N/A;  . Intraoperative transesophageal echocardiogram N/A 05/13/2013    Procedure: INTRAOPERATIVE TRANSESOPHAGEAL ECHOCARDIOGRAM;  Surgeon: Alleen BorneBryan K Bartle, MD;  [facility];  Service: Open Heart Surgery;  Laterality: N/A;  . Transthoracic echocardiogram  03/04/2013    EF 55-60%, grade 2 diastolic dysfunction, AV with mod calcified leaflets & mild regurg, calcified MV annulus, LA mod dilated, RV mildly dilated    ZOX:WRUEAVW:UJROS:General:no colds or fevers- now did have post op fever due to bursitis, no weight changes, appetite stable, walking for exercise Skin:Previous back rash resolved no ulcers HEENT:no blurred vision, no congestion CV:see HPI PUL:see HPI GI:no diarrhea constipation or melena, no indigestion GU:no hematuria, no dysuria MS:no  joint pain, no claudication, Rt thigh-lateral area of pain/discomfort healing assumed from nerve irritation from cath. Lt elbow no redness, small amt of fluid. Neuro:no syncope, no lightheadedness Endo:no diabetes, no thyroid disease  PHYSICAL EXAM BP 106/66  Pulse 71  Ht 5' 9.5" (1.765 m)  Wt 186 lb 9.6 oz (84.641 kg)  BMI 27.17 kg/m2 General:Pleasant affect, NAD Skin:Warm and dry, brisk capillary refill HEENT:normocephalic, sclera clear, mucus membranes moist Neck:supple, no JVD, no bruits  Heart:S1S2 RRR without murmur, + closure of valve, no gallup, rub or click Lungs:clear without rales, rhonchi, or wheezes WJX:BJYNAbd:soft, non tender, + BS, do not palpate liver spleen or masses Ext:no lower ext edema, 2+ pedal pulses, 2+ radial pulses, Lt elbow no redness, small amt of fluid Neuro:alert and oriented, MAE, follows commands, + facial symmetry Chest:   incision healing 1 small area of scab on chest incision and small scabs at chest tube sites.  EKG: SR 71 normal EKG   ASSESSMENT AND PLAN S/P AVR, 05/13/13, 25 mm Edwards Magna-Ease pericardial valve Doing well post op, walking though not as far as he thought.  Tired by the late afternoon, bored not doing his normal activities.  He will follow up with Dr. Tresa EndoKelly in 4 weeks.  He will call if any problems.  I answered his questions with driving- when Dr. Laneta SimmersBartle clears next visit, how much to lift-10 pounds no more-and if his dog could sleep with him in 2 weeks.   Severe aortic stenosis See above  Bursitis of elbow Seen by ortho and aspirated.  Now with small amt of fluid in Lt elbow, cool to touch.

## 2013-05-31 NOTE — Assessment & Plan Note (Signed)
Doing well post op, walking though not as far as he thought.  Tired by the late afternoon, bored not doing his normal activities.  He will follow up with Dr. Tresa EndoKelly in 4 weeks.  He will call if any problems.  I answered his questions with driving- when Dr. Laneta SimmersBartle clears next visit, how much to lift-10 pounds no more-and if his dog could sleep with him in 2 weeks.

## 2013-05-31 NOTE — Assessment & Plan Note (Signed)
See above

## 2013-05-31 NOTE — Assessment & Plan Note (Signed)
Seen by ortho and aspirated.  Now with small amt of fluid in Lt elbow, cool to touch.

## 2013-05-31 NOTE — Patient Instructions (Signed)
You are doing well.  Call if any questions or problems.  Follow up with Dr. Tresa EndoKelly in 4 weeks.  No lifting over 10 pounds until you see Dr. Laneta SimmersBartle back.

## 2013-06-03 ENCOUNTER — Other Ambulatory Visit: Payer: Self-pay | Admitting: *Deleted

## 2013-06-03 DIAGNOSIS — I359 Nonrheumatic aortic valve disorder, unspecified: Secondary | ICD-10-CM

## 2013-06-05 ENCOUNTER — Ambulatory Visit (INDEPENDENT_AMBULATORY_CARE_PROVIDER_SITE_OTHER): Payer: Self-pay | Admitting: Surgery

## 2013-06-05 ENCOUNTER — Other Ambulatory Visit: Payer: Self-pay | Admitting: *Deleted

## 2013-06-05 ENCOUNTER — Ambulatory Visit
Admission: RE | Admit: 2013-06-05 | Discharge: 2013-06-05 | Disposition: A | Discharge status: Home / Self Care | Payer: Medicare Other | Source: Ambulatory Visit | Attending: Surgery | Admitting: Surgery

## 2013-06-05 ENCOUNTER — Encounter: Payer: Self-pay | Admitting: Surgery

## 2013-06-05 VITALS — BP 118/85 | HR 70 | Resp 16 | Ht 69.5 in | Wt 177.0 lb

## 2013-06-05 DIAGNOSIS — I359 Nonrheumatic aortic valve disorder, unspecified: Secondary | ICD-10-CM

## 2013-06-05 DIAGNOSIS — I350 Nonrheumatic aortic (valve) stenosis: Secondary | ICD-10-CM

## 2013-06-05 DIAGNOSIS — Z954 Presence of other heart-valve replacement: Secondary | ICD-10-CM

## 2013-06-05 DIAGNOSIS — G8918 Other acute postprocedural pain: Secondary | ICD-10-CM

## 2013-06-05 DIAGNOSIS — Z952 Presence of prosthetic heart valve: Secondary | ICD-10-CM

## 2013-06-05 MED ORDER — TRAMADOL HCL 50 MG PO TABS: 50.0000 mg | ORAL_TABLET | ORAL | Status: DC | PRN

## 2013-06-05 NOTE — Progress Notes (Signed)
HPI:  Patient returns for routine postoperative follow-up having undergone aortic valve replacement with a 25 mm pericardial valve on 05/13/2013. The patient's early postoperative recovery while in the hospital was notable for an uncomplicated postop course. Since hospital discharge the patient reports that he has been feeling well except that he has been having knee pain of undetermined etiology. He says that he gets this periodically and then it will resolve, sometimes on the right and sometimes on the left. He also gets some pain in other joints. He is walking as much as he can and denies dyspnea and chest discomfort.   Current Outpatient Prescriptions  Medication Sig Dispense Refill  . aspirin EC 325 MG EC tablet Take 1 tablet (325 mg total) by mouth daily.  30 tablet  0  . ferrous sulfate 325 (65 FE) MG tablet Take 1 tablet (325 mg total) by mouth daily with breakfast.  30 tablet  0  . Ibuprofen-Diphenhydramine Cit (ADVIL PM PO) Take 2-3 tablets by mouth at bedtime as needed (for sleep).       [name]. lisinopril (PRINIVIL,ZESTRIL) 5 MG tablet Take 5 mg by mouth at bedtime.       . metoprolol tartrate (LOPRESSOR) 25 MG tablet Take 1 tablet (25 mg total) by mouth 2 (two) times daily.  60 tablet  0  . omeprazole (PRILOSEC OTC) 20 MG tablet Take 20 mg by mouth daily.       [name]. VYTORIN 10-40 MG per tablet Take 1 tablet by mouth at bedtime.       . traMADol (ULTRAM) 50 MG tablet Take 1-2 tablets (50-100 mg total) by mouth every 4 (four) hours as needed for moderate pain.  30 tablet  0   No current facility-administered medications for this visit.    Physical Exam: BP 118/85  Pulse 70  Resp 16  Ht 5' 9.5" (1.765 m)  Wt 177 lb (80.287 kg)  BMI 25.77 kg/m2  SpO2 98% He looks well. Lung exam is clear. Cardiac exam shows a regular rate and rhythm with normal bioprosthetic valve sounds. Chest incision is healing well and sternum is stable. There is no peripheral edema.    Diagnostic  Tests:  CLINICAL DATA: Aortic valve replacement in January 2015, currently  asymptomatic  EXAM:  CHEST 2 VIEW  COMPARISON: Portable chest x-ray of May 14, 2013.  FINDINGS:  The lungs are well-expanded and clear. There is no pleural effusion  or pneumothorax. The cardiopericardial silhouette is normal in size  and contour. The prosthetic aortic valve appears be in appropriate  position. There is no pulmonary vascular congestion. There is mild  tortuosity of the descending thoracic aorta. There are 7 intact  sternal wires demonstrated. The bony structures exhibit no acute  abnormalities.  IMPRESSION:  No active cardiopulmonary disease.  Electronically Signed  By: David SwazilandJordan  On: 06/05/2013 12:02   Impression:  Overall I think he is doing well. I encouraged him to continue walking. He is planning to participate in cardiac rehab. I told him he could drive his car but should not lift anything heavier than 10 lbs for three months postop. I am not sure why he has this fluctuating knee pain but I wonder if it is related to his statin drug. He may benefit from stopping it for a while to see if this resolves but I will leave that decision up to Dr. Tresa EndoKelly.  Plan:  He will continue to follow-up with Dr. Tresa EndoKelly and will call me  if he develops any problems with his incision.

## 2013-06-09 ENCOUNTER — Other Ambulatory Visit: Payer: Self-pay | Admitting: Surgery

## 2013-06-11 ENCOUNTER — Other Ambulatory Visit: Payer: Self-pay | Admitting: *Deleted

## 2013-06-11 MED ORDER — FERROUS SULFATE 325 (65 FE) MG PO TABS: 325.0000 mg | ORAL_TABLET | Freq: Every day | ORAL | Status: DC

## 2013-06-11 MED ORDER — METOPROLOL TARTRATE 25 MG PO TABS: 25.0000 mg | ORAL_TABLET | Freq: Two times a day (BID) | ORAL | Status: DC

## 2013-06-11 NOTE — Telephone Encounter (Signed)
Walk-In Message requesting refill on beta blocker and iron.  Stated Dr. Sharee PimpleBartle's office started meds, but will not refill.  Were told to contact Dr. Landry DykeKelly's office for refill.  Returned call and pt verified x 2.  Pt informed refills will be sent and advised he discuss further at next appt.  Refill(s) sent to pharmacy.

## 2013-06-12 ENCOUNTER — Other Ambulatory Visit: Payer: Self-pay | Admitting: *Deleted

## 2013-06-12 MED ORDER — METOPROLOL TARTRATE 25 MG PO TABS: 25.0000 mg | ORAL_TABLET | Freq: Two times a day (BID) | ORAL | Status: DC

## 2013-06-12 NOTE — Telephone Encounter (Signed)
Rx was sent to pharmacy electronically. 

## 2013-06-13 ENCOUNTER — Encounter (HOSPITAL_COMMUNITY)
Admission: RE | Admit: 2013-06-13 | Discharge: 2013-06-13 | Disposition: A | Discharge status: Home / Self Care | Payer: Medicare Other | Source: Ambulatory Visit | Attending: Cardiovascular Disease | Admitting: Cardiovascular Disease

## 2013-06-13 DIAGNOSIS — E8779 Other fluid overload: Secondary | ICD-10-CM | POA: Insufficient documentation

## 2013-06-13 DIAGNOSIS — D62 Acute posthemorrhagic anemia: Secondary | ICD-10-CM | POA: Insufficient documentation

## 2013-06-13 DIAGNOSIS — I359 Nonrheumatic aortic valve disorder, unspecified: Secondary | ICD-10-CM | POA: Insufficient documentation

## 2013-06-13 DIAGNOSIS — I10 Essential (primary) hypertension: Secondary | ICD-10-CM | POA: Insufficient documentation

## 2013-06-13 DIAGNOSIS — I2510 Atherosclerotic heart disease of native coronary artery without angina pectoris: Secondary | ICD-10-CM | POA: Insufficient documentation

## 2013-06-13 DIAGNOSIS — Z87891 Personal history of nicotine dependence: Secondary | ICD-10-CM | POA: Insufficient documentation

## 2013-06-13 DIAGNOSIS — Z8249 Family history of ischemic heart disease and other diseases of the circulatory system: Secondary | ICD-10-CM | POA: Insufficient documentation

## 2013-06-13 DIAGNOSIS — K219 Gastro-esophageal reflux disease without esophagitis: Secondary | ICD-10-CM | POA: Insufficient documentation

## 2013-06-13 DIAGNOSIS — Z5189 Encounter for other specified aftercare: Secondary | ICD-10-CM | POA: Insufficient documentation

## 2013-06-13 DIAGNOSIS — E785 Hyperlipidemia, unspecified: Secondary | ICD-10-CM | POA: Insufficient documentation

## 2013-06-13 DIAGNOSIS — J9819 Other pulmonary collapse: Secondary | ICD-10-CM | POA: Insufficient documentation

## 2013-06-13 DIAGNOSIS — Z7982 Long term (current) use of aspirin: Secondary | ICD-10-CM | POA: Insufficient documentation

## 2013-06-13 DIAGNOSIS — D696 Thrombocytopenia, unspecified: Secondary | ICD-10-CM | POA: Insufficient documentation

## 2013-06-13 DIAGNOSIS — I2789 Other specified pulmonary heart diseases: Secondary | ICD-10-CM | POA: Insufficient documentation

## 2013-06-13 NOTE — Progress Notes (Signed)
Cardiac Rehab Medication Review by a Pharmacist  Does the patient  feel that his/her medications are working for him/her?  yes  Has the patient been experiencing any side effects to the medications prescribed?  No, does have cold hands which he has heard is from Beta Blocker  Does the patient measure his/her own blood pressure or blood glucose at home?  no , but does have a home cuff  Does the patient have any problems obtaining medications due to transportation or finances?   no  Understanding of regimen: excellent Understanding of indications: good Potential of compliance: good   Pharmacist comments: Patient only has about 3 days left of his metoprolol and states he does not have a refill though he has tried to contact his cardiologist, Dr. Tresa EndoKelly, and Dr. Laneta SimmersBartle. Otherwise, he seems to be doing well.    Thank you, Piedad ClimesKelley Miller, PharmD Clinical Pharmacist - Resident Pager: [phone] Pharmacy: [phone] 06/13/2013 9:19 AM

## 2013-06-17 ENCOUNTER — Encounter (HOSPITAL_COMMUNITY)
Admission: RE | Admit: 2013-06-17 | Discharge: 2013-06-17 | Disposition: A | Discharge status: Home / Self Care | Payer: Medicare Other | Source: Ambulatory Visit | Attending: Cardiovascular Disease | Admitting: Cardiovascular Disease

## 2013-06-17 NOTE — Progress Notes (Signed)
Pt in today for his first day of exercise at 11:15 phase II cardiac rehab.  Monitor showed sr with st depression and t wave inversion no noted ectopy noted. Pt tolerated exercise with no complaints of sob or discomfort.  Pt's short term goal is to ride a bike again and long term goal increase exercise capacity.  Will reassess progress toward these goals during his participation.  Alanson Alyarlette Kerin Kren RN, BSN

## 2013-06-19 ENCOUNTER — Encounter (HOSPITAL_COMMUNITY)
Admission: RE | Admit: 2013-06-19 | Discharge: 2013-06-19 | Disposition: A | Discharge status: Home / Self Care | Payer: Medicare Other | Source: Ambulatory Visit | Attending: Cardiovascular Disease | Admitting: Cardiovascular Disease

## 2013-06-21 ENCOUNTER — Encounter (HOSPITAL_COMMUNITY)
Admission: RE | Admit: 2013-06-21 | Discharge: 2013-06-21 | Disposition: A | Discharge status: Home / Self Care | Payer: Medicare Other | Source: Ambulatory Visit | Attending: Cardiovascular Disease | Admitting: Cardiovascular Disease

## 2013-06-24 ENCOUNTER — Encounter (HOSPITAL_COMMUNITY)
Admission: RE | Admit: 2013-06-24 | Discharge: 2013-06-24 | Disposition: A | Discharge status: Home / Self Care | Payer: Medicare Other | Source: Ambulatory Visit | Attending: Cardiovascular Disease | Admitting: Cardiovascular Disease

## 2013-06-26 ENCOUNTER — Encounter (HOSPITAL_COMMUNITY)
Admission: RE | Admit: 2013-06-26 | Discharge: 2013-06-26 | Disposition: A | Discharge status: Home / Self Care | Payer: Medicare Other | Source: Ambulatory Visit | Attending: Cardiovascular Disease | Admitting: Cardiovascular Disease

## 2013-06-28 ENCOUNTER — Encounter (HOSPITAL_COMMUNITY)
Admission: RE | Admit: 2013-06-28 | Discharge: 2013-06-28 | Disposition: A | Discharge status: Home / Self Care | Payer: Medicare Other | Source: Ambulatory Visit | Attending: Cardiovascular Disease | Admitting: Cardiovascular Disease

## 2013-06-28 ENCOUNTER — Ambulatory Visit: Payer: Medicare Other | Admitting: Cardiovascular Disease

## 2013-06-28 NOTE — Progress Notes (Signed)
Reviewed home exercise with pt today.  Pt plans to continue walking at home for exercise.  Reviewed THR, pulse, RPE, sign and symptoms, and when to call 911 or MD.  Pt voiced understanding. Alaysiah Browder, MA, ACSM RCEP  

## 2013-07-01 ENCOUNTER — Encounter (HOSPITAL_COMMUNITY)
Admission: RE | Admit: 2013-07-01 | Discharge: 2013-07-01 | Disposition: A | Discharge status: Home / Self Care | Payer: Medicare Other | Source: Ambulatory Visit | Attending: Cardiovascular Disease | Admitting: Cardiovascular Disease

## 2013-07-01 DIAGNOSIS — I359 Nonrheumatic aortic valve disorder, unspecified: Secondary | ICD-10-CM | POA: Insufficient documentation

## 2013-07-01 DIAGNOSIS — I10 Essential (primary) hypertension: Secondary | ICD-10-CM | POA: Insufficient documentation

## 2013-07-01 DIAGNOSIS — D696 Thrombocytopenia, unspecified: Secondary | ICD-10-CM | POA: Insufficient documentation

## 2013-07-01 DIAGNOSIS — Z8249 Family history of ischemic heart disease and other diseases of the circulatory system: Secondary | ICD-10-CM | POA: Insufficient documentation

## 2013-07-01 DIAGNOSIS — K219 Gastro-esophageal reflux disease without esophagitis: Secondary | ICD-10-CM | POA: Insufficient documentation

## 2013-07-01 DIAGNOSIS — Z87891 Personal history of nicotine dependence: Secondary | ICD-10-CM | POA: Insufficient documentation

## 2013-07-01 DIAGNOSIS — E8779 Other fluid overload: Secondary | ICD-10-CM | POA: Insufficient documentation

## 2013-07-01 DIAGNOSIS — J9819 Other pulmonary collapse: Secondary | ICD-10-CM | POA: Insufficient documentation

## 2013-07-01 DIAGNOSIS — D62 Acute posthemorrhagic anemia: Secondary | ICD-10-CM | POA: Insufficient documentation

## 2013-07-01 DIAGNOSIS — E785 Hyperlipidemia, unspecified: Secondary | ICD-10-CM | POA: Insufficient documentation

## 2013-07-01 DIAGNOSIS — Z5189 Encounter for other specified aftercare: Secondary | ICD-10-CM | POA: Insufficient documentation

## 2013-07-01 DIAGNOSIS — I2510 Atherosclerotic heart disease of native coronary artery without angina pectoris: Secondary | ICD-10-CM | POA: Insufficient documentation

## 2013-07-01 DIAGNOSIS — I2789 Other specified pulmonary heart diseases: Secondary | ICD-10-CM | POA: Insufficient documentation

## 2013-07-01 DIAGNOSIS — Z7982 Long term (current) use of aspirin: Secondary | ICD-10-CM | POA: Insufficient documentation

## 2013-07-03 ENCOUNTER — Encounter (HOSPITAL_COMMUNITY)
Admission: RE | Admit: 2013-07-03 | Discharge: 2013-07-03 | Disposition: A | Discharge status: Home / Self Care | Payer: Medicare Other | Source: Ambulatory Visit | Attending: Cardiovascular Disease | Admitting: Cardiovascular Disease

## 2013-07-05 ENCOUNTER — Encounter (HOSPITAL_COMMUNITY)
Admission: RE | Admit: 2013-07-05 | Discharge: 2013-07-05 | Disposition: A | Discharge status: Home / Self Care | Payer: Medicare Other | Source: Ambulatory Visit | Attending: Cardiovascular Disease | Admitting: Cardiovascular Disease

## 2013-07-08 ENCOUNTER — Encounter: Payer: Self-pay | Admitting: Cardiovascular Disease

## 2013-07-08 ENCOUNTER — Encounter (HOSPITAL_COMMUNITY)
Admission: RE | Admit: 2013-07-08 | Discharge: 2013-07-08 | Disposition: A | Discharge status: Home / Self Care | Payer: Medicare Other | Source: Ambulatory Visit | Attending: Cardiovascular Disease | Admitting: Cardiovascular Disease

## 2013-07-08 ENCOUNTER — Ambulatory Visit (INDEPENDENT_AMBULATORY_CARE_PROVIDER_SITE_OTHER): Payer: Medicare Other | Admitting: Cardiovascular Disease

## 2013-07-08 VITALS — BP 108/70 | HR 68 | Ht 69.0 in | Wt 185.6 lb

## 2013-07-08 DIAGNOSIS — E785 Hyperlipidemia, unspecified: Secondary | ICD-10-CM

## 2013-07-08 DIAGNOSIS — E782 Mixed hyperlipidemia: Secondary | ICD-10-CM

## 2013-07-08 DIAGNOSIS — M7030 Other bursitis of elbow, unspecified elbow: Secondary | ICD-10-CM

## 2013-07-08 DIAGNOSIS — R5381 Other malaise: Secondary | ICD-10-CM

## 2013-07-08 DIAGNOSIS — I359 Nonrheumatic aortic valve disorder, unspecified: Secondary | ICD-10-CM

## 2013-07-08 DIAGNOSIS — R5383 Other fatigue: Secondary | ICD-10-CM

## 2013-07-08 DIAGNOSIS — D649 Anemia, unspecified: Secondary | ICD-10-CM

## 2013-07-08 DIAGNOSIS — M7020 Olecranon bursitis, unspecified elbow: Secondary | ICD-10-CM

## 2013-07-08 DIAGNOSIS — I350 Nonrheumatic aortic (valve) stenosis: Secondary | ICD-10-CM

## 2013-07-08 DIAGNOSIS — Z954 Presence of other heart-valve replacement: Secondary | ICD-10-CM

## 2013-07-08 DIAGNOSIS — I10 Essential (primary) hypertension: Secondary | ICD-10-CM

## 2013-07-08 DIAGNOSIS — Z952 Presence of prosthetic heart valve: Secondary | ICD-10-CM

## 2013-07-08 MED ORDER — AMOXICILLIN 500 MG PO TABS: ORAL_TABLET | ORAL | Status: DC

## 2013-07-08 MED ORDER — METOPROLOL SUCCINATE ER 50 MG PO TB24: 50.0000 mg | ORAL_TABLET | Freq: Every day | ORAL | Status: DC

## 2013-07-08 NOTE — Progress Notes (Signed)
Drew Andrews 68 y.o. male Nutrition Note Spoke with pt.  Nutrition Plan and Nutrition Survey goals reviewed with pt. Pt is working toward following the Therapeutic Lifestyle Changes diet. Pt wants to maintain his wt. According to pt, his wt usually fluctuates from approximately 175-180 lb. According to pt's nutrition survey, pt stated he drank more than 3 alcoholic beverages a day. Per pt, "I used to drink a beer and a bottle of wine daily." Pt states he now drinks " a beer and 1/2 a bottle of wine daily." Pt reports he is happy with his ETOH consumption at this point in time. Pt consumes a significant amount of added sugar in his diet (e.g. 1 TBSP sugar/cup of coffee x 3 every am and a Root beer float daily). Added sugar consumption discussed and pt encouraged to decrease added sugars. Pt expressed understanding of the information reviewed. Pt aware of nutrition education classes offered and has attended a nutrition class.  Nutrition Diagnosis   Food-and nutrition-related knowledge deficit related to lack of exposure to information as related to diagnosis of: ? CVD ?   Nutrition RX/ Estimated Daily Nutrition Needs for: wt maintenance 2200-2500 Kcal, 70-80 gm fat, 14-17 gm sat fat, 2.2-2.5 gm trans-fat, <1500 mg sodium   Nutrition Intervention   Pt's individual nutrition plan including cholesterol goals reviewed with pt.   Benefits of adopting Therapeutic Lifestyle Changes discussed when Medficts reviewed.   Pt to attend the Portion Distortion class - met; 06/26/13   Pt to attend the  ? Nutrition I class - met; 07/02/13                    ? Nutrition II class   Continue client-centered nutrition education by RD, as part of interdisciplinary care.  Goal(s)   Pt to identify and limit food sources of saturated fat, trans fat, and cholesterol   Pt to describe the benefit of including fruits, vegetables, whole grains, low-fat dairy products and decreased added sugar in a heart healthy meal  plan.  Monitor and Evaluate progress toward nutrition goal with team. Nutrition Risk:  Low   Edna Franko, M.Ed, RD, LDN, CDE 07/08/2013 1:43 PM 

## 2013-07-08 NOTE — Patient Instructions (Signed)
Your physician has requested that you have an echocardiogram. Echocardiography is a painless test that uses sound waves to create images of your heart. It provides your doctor with information about the size and shape of your heart and how well your heart's chambers and valves are working. This procedure takes approximately one hour. There are no restrictions for this procedure. This will be done in August 2015.  Your physician recommends that you return for lab work fasting.  Your physician recommends that you schedule a follow-up appointment in: September 2015.  Your physician has recommended you make the following change in your medication: decrease the aspirin to 81 mg daily. The metoprolol has been changed from twice daily dosing to once daily dose. This has already been sent to the pharmacy.

## 2013-07-09 LAB — LIPID PANEL
CHOL/HDL RATIO: 2.9 ratio
CHOLESTEROL: 122 mg/dL (ref 0–200)
HDL: 42 mg/dL (ref >39)
LDL Cholesterol: 62 mg/dL (ref 0–99)
Triglycerides: 89 mg/dL (ref <150)
VLDL: 18 mg/dL (ref 0–40)

## 2013-07-09 LAB — FERRITIN: FERRITIN: 12 ng/mL — AB (ref 22–322)

## 2013-07-09 LAB — COMPREHENSIVE METABOLIC PANEL
ALT: 21 U/L (ref 0–53)
AST: 16 U/L (ref 0–37)
Albumin: 4.1 g/dL (ref 3.5–5.2)
Alkaline Phosphatase: 44 U/L (ref 39–117)
BUN: 19 mg/dL (ref 6–23)
CALCIUM: 8.7 mg/dL (ref 8.4–10.5)
CHLORIDE: 108 meq/L (ref 96–112)
CO2: 22 mEq/L (ref 19–32)
CREATININE: 1.02 mg/dL (ref 0.50–1.35)
Glucose, Bld: 97 mg/dL (ref 70–99)
Potassium: 4.5 mEq/L (ref 3.5–5.3)
Sodium: 139 mEq/L (ref 135–145)
TOTAL PROTEIN: 6.5 g/dL (ref 6.0–8.3)
Total Bilirubin: 0.4 mg/dL (ref 0.2–1.2)

## 2013-07-09 LAB — CBC
HCT: 36.7 % — ABNORMAL LOW (ref 39.0–52.0)
Hemoglobin: 12.3 g/dL — ABNORMAL LOW (ref 13.0–17.0)
MCH: 31.9 pg (ref 26.0–34.0)
MCHC: 33.5 g/dL (ref 30.0–36.0)
MCV: 95.1 fL (ref 78.0–100.0)
PLATELETS: 166 10*3/uL (ref 150–400)
RBC: 3.86 MIL/uL — AB (ref 4.22–5.81)
RDW: 14.4 % (ref 11.5–15.5)
WBC: 6.8 10*3/uL (ref 4.0–10.5)

## 2013-07-09 LAB — IRON AND TIBC
%SAT: 14 % — AB (ref 20–55)
IRON: 48 ug/dL (ref 42–165)
TIBC: 336 ug/dL (ref 215–435)
UIBC: 288 ug/dL (ref 125–400)

## 2013-07-09 LAB — TSH: TSH: 1.554 u[IU]/mL (ref 0.350–4.500)

## 2013-07-10 ENCOUNTER — Encounter (HOSPITAL_COMMUNITY)
Admission: RE | Admit: 2013-07-10 | Discharge: 2013-07-10 | Disposition: A | Discharge status: Home / Self Care | Payer: Medicare Other | Source: Ambulatory Visit | Attending: Cardiovascular Disease | Admitting: Cardiovascular Disease

## 2013-07-10 NOTE — Progress Notes (Signed)
Pt seen in the office on yesterday per pt reports.  Pt with medication changes.  Pt switched to Toprol XL 50mg  daily.  Pt has not started the new medication due to awaiting the medication by mail order.  Pt advised to let rehab staff know when he starts the new medication. Pt verbalized understanding.

## 2013-07-12 ENCOUNTER — Encounter (HOSPITAL_COMMUNITY)
Admission: RE | Admit: 2013-07-12 | Discharge: 2013-07-12 | Disposition: A | Discharge status: Home / Self Care | Payer: Medicare Other | Source: Ambulatory Visit | Attending: Cardiovascular Disease | Admitting: Cardiovascular Disease

## 2013-07-13 ENCOUNTER — Encounter: Payer: Self-pay | Admitting: Cardiovascular Disease

## 2013-07-13 DIAGNOSIS — Z952 Presence of prosthetic heart valve: Secondary | ICD-10-CM | POA: Insufficient documentation

## 2013-07-13 NOTE — Progress Notes (Signed)
Patient ID: [employer], male   DOB: 08/08/1945, 68 y.o.   MRN: [id number]     HPI: Drew EgeMichael E Andrews is a 68 y.o. male who presents for cardiology followup evaluation following his recent aortic valve replacement for severe aortic valve stenosis.  Mr. Drew Ridgentonowiczis  was first told of having a heart murmur back in the 1980s when he was planning to work at  [employer] and had a physical exam done at that time. I have been seeing him since 2005 when he was referred to me by Dr. Kinnie Andrews for cardiac murmur. Since 2005, we have been following him fairly closely with frequent followup echo Doppler studies to assess his aortic valve. At that time, his aortic transvalvular gradient was 37 with a mean gradient of 18. Over the last 9 years, his aortic valve murmur has gradually become more significant such that last year in September 2013 he had a mean gradient of 41 with a peak instantaneous gradient of 73. At that time he remained entirely asymptomatic and was exercising 7 days per week and typically riding his bicycle anywhere from 40 minutes to an hour and a half without abnormalities. A subsequent Doppler study in March 2014 showed moderate concentric LVH with grade 2 diastolic dysfunction; Mean gradient was 40 and peak gradient 71 and calculated valve area was 0.9 cm. Over the years I have informed him of any symptoms that can occur as a result of this aortic stenosis. He recently had a six-month followup echo Doppler assessment on 03/04/2013. Ejection fraction again remained 55-60%. He did have grade 2 diastolic dysfunction. His aortic valve again was moderately calcified. The stenosis is felt to be severe with mild aortic regurgitation. On his most recent echo, mean gradient is now 47 and peak instantaneous gradient is 86 giving a calculated aortic valve area of 0.63 cm. He did have mitral annular calcification. His left atrium was moderately dilated. He did have very mild RV  dilatation.  I last saw him in November 2014 Mr. Keetch was continued to exercise but there clearly was a change with development of new exertional shortness of breath. At that time, I strongly recommended cardiac catheterization and he underwent right and left heart cardiac catheterization on 03/15/2013. This confirmed severe aortic valve stenosis mild pulmonary hypertension. He had mild chronic calcification but nonobstructive 20% narrowing in the LAD.   He underwent successful aortic valve replacement surgery by Dr. Rexanne ManoBrian Andrews 05/13/2013 and had a 25 mm [facility]ase bovine pericardial valve inserted. Socially, he has done remarkably well following his valve replacement surgery. He denies any episodes of palpitations. He has resumed activity.  Past Medical History  Diagnosis Date  . Seasonal allergies   . GERD (gastroesophageal reflux disease)   . LVH (left ventricular hypertrophy)     with aortic stenosis-bicuspid  . Hyperlipidemia   . Hypertension   . PONV (postoperative nausea and vomiting)     as a child  . S/P AVR (aortic valve replacement) 05/13/2013    Past Surgical History  Procedure Laterality Date  . Appendectomy  age 68  . Tonsillectomy    . Hiatal hernia repair    . Colonoscopy      X 2  . Cardiac catheterization    . Aortic valve replacement N/A 05/13/2013    Procedure: AORTIC VALVE REPLACEMENT (AVR);  Surgeon: Alleen BorneBryan K Bartle, MD;  [facility];  Service: Open Heart Surgery;  Laterality: N/A;  . Intraoperative transesophageal echocardiogram N/A 05/13/2013  Procedure: INTRAOPERATIVE TRANSESOPHAGEAL ECHOCARDIOGRAM;  Surgeon: Alleen Borne, MD;  Location: [facility] OR;  Service: Open Heart Surgery;  Laterality: N/A;  . Transthoracic echocardiogram  03/04/2013    EF 55-60%, grade 2 diastolic dysfunction, AV with mod calcified leaflets & mild regurg, calcified MV annulus, LA mod dilated, RV mildly dilated    Allergies  Allergen Reactions  . Scopolamine      seizure    Current Outpatient Prescriptions  Medication Sig Dispense Refill  . aspirin 81 MG tablet Take 81 mg by mouth daily.      . ferrous sulfate 325 (65 FE) MG tablet Take 1 tablet (325 mg total) by mouth daily with breakfast.  30 tablet  0  . Ibuprofen-Diphenhydramine Cit (ADVIL PM PO) Take 2-3 tablets by mouth at bedtime as needed (for sleep).       [name] lisinopril (PRINIVIL,ZESTRIL) 5 MG tablet Take 5 mg by mouth at bedtime.       . meloxicam (MOBIC) 15 MG tablet Take 1 tablet by mouth as needed.      [name] omeprazole (PRILOSEC OTC) 20 MG tablet Take 20 mg by mouth daily.       . traMADol (ULTRAM) 50 MG tablet Take 1-2 tablets (50-100 mg total) by mouth every 4 (four) hours as needed for moderate pain.  30 tablet  0  . VYTORIN 10-40 MG per tablet Take 1 tablet by mouth at bedtime.       [name] amoxicillin (AMOXIL) 500 MG tablet Take 4 tablets 1 hour prior to dental procedure  4 tablet  11  . metoprolol succinate (TOPROL-XL) 50 MG 24 hr tablet Take 1 tablet (50 mg total) by mouth daily. Take with or immediately following a meal.  90 tablet  3   No current facility-administered medications for this visit.    History   Social History  . Marital Status: Married    Spouse Name: N/A    Number of Children: 1  . Years of Education: college   Occupational History  . Not on file.   Social History Main Topics  . Smoking status: Former Smoker -- 1.00 packs/day    Quit date: 02/10/1974  . Smokeless tobacco: Never Used  . Alcohol Use: Yes     Comment: Drinks 1 bottle of wine daily and 1 beer  . Drug Use: No  . Sexual Activity: Not on file   Other Topics Concern  . Not on file   Social History Narrative  . 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 He quit tobacco in 1979. He continues to exercise 7 days per week.  Family History  Problem Relation Age of Onset  .  Colon cancer Neg Hx   . Stomach cancer Neg Hx   . Heart attack Father   . Acute myelogenous leukemia Brother   . Hypertension Mother   . Hyperlipidemia Mother   . Diabetes Mother   . Pneumonia Maternal Grandmother   . Heart attack Maternal Grandfather     ROS is negative for fevers, chills or night sweats. He denies any changes in skin or turgor. There is no hair loss. He denies visual symptoms. He denies cough. There is no excess sputum production. He denies PND or orthopnea. There is no wheezing. There is no presyncope or syncope. He is unaware of palpitations.  His exertional shortness of breath has improved. There is  no chest pain. He denies abdominal pain nausea vomiting or diarrhea. He denies change in bowel or bladder habits. He denies GU symptoms. He denies myalgias. He denies paresthesias. Mild arthritic issues of his left elbow, and knees. There is no known diabetes or endocrine problems. There is no thyroid abnormalities. He denies change in mental status. He denies sleep issues.  Other comprehensive 14 point system review is negative.  PE BP 108/70  Pulse 68  Ht 5\' 9"  (1.753 m)  Wt 185 lb 9.6 oz (84.188 kg)  BMI 27.40 kg/m2  Repeat blood pressure was 138/76. There was no orthostatic change. General: Alert, oriented, no distress.  Skin: normal turgor, no rashes HEENT: Normocephalic, atraumatic. Pupils round and reactive; sclera anicteric;no lid lag.  Nose without nasal septal hypertrophy Mouth/Parynx benign; Mallinpatti scale 2 Neck: No JVD; transmitted murmurs to his carotids bilaterally Lungs: clear to ausculatation and percussion; no wheezing or rales No chest wall tenderness to palpation Heart: RRR, s1 s2 norma 2/6 systolic murmur concordant with his aortic valve replacement. No AR murmur. No S3 or S4 gallop. No rubs thrills or heaves.  Abdomen: soft, nontender; no hepatosplenomehaly, BS+; abdominal aorta nontender and not dilated by palpation. Back: No CVA  tenderness Pulses 2+ Femoral pulses 2+ without bruits. Extremities: no clubbing cyanosis or edema, Homan's sign negative  Neurologic: grossly nonfocal; normal motor strength Psychologic: normal affect and mood.  EKG (independently read by the) normal sinus rhythm. Non-specific ST-T changes inferolaterally.   Prior 03/12/13 ECG: Sinus rhythm at 66 beats per minute. First-degree AV block. LVH by voltage criteria. Mild nondiagnostic T changes.  LABS:  BMET    Component Value Date/Time   NA 139 07/08/2013 0856     Hepatic Function Panel     Component Value Date/Time   PROT 6.5 07/08/2013 0856     CBC    Component Value Date/Time   WBC 6.8 07/08/2013 0856     BNP No results found for this basename: probnp    Lipid Panel     Component Value Date/Time   CHOL 122 07/08/2013 0856     RADIOLOGY: No results found.    ASSESSMENT AND PLAN: Mr. Chinita Pester had developed progressive severe aortic valve stenosis and underwent successful bovine pericardial tissue valve replacement on 05/13/2013. He is now almost 2 months following successful surgery. He states that after 5 weeks he began to notice significant improvement from his prior symptomatology. His blood pressure today is well-controlled at 118/78 when rechecked by me. Resting pulse is excellent. He's not having any signs of heart failure. He's not having palpitations. He has been  taking the metoprolol tartrate 25 mg twice a day and when this prescription runs out I'm changing him to Toprol XL 50 mg daily. I've also suggested he reduce his aspirin from 325 mg to 81 mg. He's continuing to take his lisinopril at 5 mg and is tolerating this well. Torn 10/44 aggressive control of his lipids. Repeat laboratory will be checked consisting of a CBC, comprehensive metabolic panel, lipid panel and TSH. He has been on iron replacement since his surgery. I will also check iron, TIBC, and ferritin levels and if these are normal his iron will be  discontinued. August 2015 I'm recommending a six-month followup echo Doppler study following his valve replacement establish a new baseline. I will see him in September for Cardiologic followup evaluation.  Lennette Bihari, MD, [facility]  07/13/2013 2:56 PM

## 2013-07-15 ENCOUNTER — Encounter (HOSPITAL_COMMUNITY)
Admission: RE | Admit: 2013-07-15 | Discharge: 2013-07-15 | Disposition: A | Discharge status: Home / Self Care | Payer: Medicare Other | Source: Ambulatory Visit | Attending: Cardiovascular Disease | Admitting: Cardiovascular Disease

## 2013-07-16 ENCOUNTER — Telehealth: Payer: Self-pay | Admitting: *Deleted

## 2013-07-16 DIAGNOSIS — D649 Anemia, unspecified: Secondary | ICD-10-CM

## 2013-07-16 MED ORDER — FERROUS SULFATE 325 (65 FE) MG PO TABS: 325.0000 mg | ORAL_TABLET | Freq: Every day | ORAL | Status: DC

## 2013-07-16 NOTE — Telephone Encounter (Signed)
Informed wife per Dr. Tresa EndoKelly Andrews still low. Continue to take the iron tablets and have blood rechecked in 4 weeks. Wife voiced understanding of instructions. Requested a prescription for the iron to be sent to the pharmacy.

## 2013-07-17 ENCOUNTER — Encounter (HOSPITAL_COMMUNITY)
Admission: RE | Admit: 2013-07-17 | Discharge: 2013-07-17 | Disposition: A | Discharge status: Home / Self Care | Payer: Medicare Other | Source: Ambulatory Visit | Attending: Cardiovascular Disease | Admitting: Cardiovascular Disease

## 2013-07-19 ENCOUNTER — Telehealth: Payer: Self-pay | Admitting: *Deleted

## 2013-07-19 ENCOUNTER — Encounter (HOSPITAL_COMMUNITY)
Admission: RE | Admit: 2013-07-19 | Discharge: 2013-07-19 | Disposition: A | Discharge status: Home / Self Care | Payer: Medicare Other | Source: Ambulatory Visit | Attending: Cardiovascular Disease | Admitting: Cardiovascular Disease

## 2013-07-19 NOTE — Telephone Encounter (Signed)
Faxed cardiac rehab prescription.

## 2013-07-22 ENCOUNTER — Encounter (HOSPITAL_COMMUNITY)
Admission: RE | Admit: 2013-07-22 | Discharge: 2013-07-22 | Disposition: A | Discharge status: Home / Self Care | Payer: Medicare Other | Source: Ambulatory Visit | Attending: Cardiovascular Disease | Admitting: Cardiovascular Disease

## 2013-07-24 ENCOUNTER — Encounter (HOSPITAL_COMMUNITY)
Admission: RE | Admit: 2013-07-24 | Discharge: 2013-07-24 | Disposition: A | Discharge status: Home / Self Care | Payer: Medicare Other | Source: Ambulatory Visit | Attending: Cardiovascular Disease | Admitting: Cardiovascular Disease

## 2013-07-26 ENCOUNTER — Encounter (HOSPITAL_COMMUNITY)
Admission: RE | Admit: 2013-07-26 | Discharge: 2013-07-26 | Disposition: A | Discharge status: Home / Self Care | Payer: Medicare Other | Source: Ambulatory Visit | Attending: Cardiovascular Disease | Admitting: Cardiovascular Disease

## 2013-07-29 ENCOUNTER — Encounter (HOSPITAL_COMMUNITY)
Admission: RE | Admit: 2013-07-29 | Discharge: 2013-07-29 | Disposition: A | Discharge status: Home / Self Care | Payer: Medicare Other | Source: Ambulatory Visit | Attending: Cardiovascular Disease | Admitting: Cardiovascular Disease

## 2013-07-31 ENCOUNTER — Encounter (HOSPITAL_COMMUNITY)
Admission: RE | Admit: 2013-07-31 | Discharge: 2013-07-31 | Disposition: A | Discharge status: Home / Self Care | Payer: Medicare Other | Source: Ambulatory Visit | Attending: Cardiovascular Disease | Admitting: Cardiovascular Disease

## 2013-07-31 DIAGNOSIS — K219 Gastro-esophageal reflux disease without esophagitis: Secondary | ICD-10-CM | POA: Insufficient documentation

## 2013-07-31 DIAGNOSIS — Z87891 Personal history of nicotine dependence: Secondary | ICD-10-CM | POA: Insufficient documentation

## 2013-07-31 DIAGNOSIS — D62 Acute posthemorrhagic anemia: Secondary | ICD-10-CM | POA: Insufficient documentation

## 2013-07-31 DIAGNOSIS — Z8249 Family history of ischemic heart disease and other diseases of the circulatory system: Secondary | ICD-10-CM | POA: Insufficient documentation

## 2013-07-31 DIAGNOSIS — I10 Essential (primary) hypertension: Secondary | ICD-10-CM | POA: Insufficient documentation

## 2013-07-31 DIAGNOSIS — Z5189 Encounter for other specified aftercare: Secondary | ICD-10-CM | POA: Insufficient documentation

## 2013-07-31 DIAGNOSIS — I2789 Other specified pulmonary heart diseases: Secondary | ICD-10-CM | POA: Insufficient documentation

## 2013-07-31 DIAGNOSIS — I359 Nonrheumatic aortic valve disorder, unspecified: Secondary | ICD-10-CM | POA: Insufficient documentation

## 2013-07-31 DIAGNOSIS — D696 Thrombocytopenia, unspecified: Secondary | ICD-10-CM | POA: Insufficient documentation

## 2013-07-31 DIAGNOSIS — E785 Hyperlipidemia, unspecified: Secondary | ICD-10-CM | POA: Insufficient documentation

## 2013-07-31 DIAGNOSIS — I2510 Atherosclerotic heart disease of native coronary artery without angina pectoris: Secondary | ICD-10-CM | POA: Insufficient documentation

## 2013-07-31 DIAGNOSIS — Z7982 Long term (current) use of aspirin: Secondary | ICD-10-CM | POA: Insufficient documentation

## 2013-07-31 DIAGNOSIS — E8779 Other fluid overload: Secondary | ICD-10-CM | POA: Insufficient documentation

## 2013-07-31 DIAGNOSIS — J9819 Other pulmonary collapse: Secondary | ICD-10-CM | POA: Insufficient documentation

## 2013-08-02 ENCOUNTER — Encounter (HOSPITAL_COMMUNITY)
Admission: RE | Admit: 2013-08-02 | Discharge: 2013-08-02 | Disposition: A | Discharge status: Home / Self Care | Payer: Medicare Other | Source: Ambulatory Visit | Attending: Cardiovascular Disease | Admitting: Cardiovascular Disease

## 2013-08-05 ENCOUNTER — Encounter (HOSPITAL_COMMUNITY)
Admission: RE | Admit: 2013-08-05 | Discharge: 2013-08-05 | Disposition: A | Discharge status: Home / Self Care | Payer: Medicare Other | Source: Ambulatory Visit | Attending: Cardiovascular Disease | Admitting: Cardiovascular Disease

## 2013-08-07 ENCOUNTER — Encounter (HOSPITAL_COMMUNITY)
Admission: RE | Admit: 2013-08-07 | Discharge: 2013-08-07 | Disposition: A | Discharge status: Home / Self Care | Payer: Medicare Other | Source: Ambulatory Visit | Attending: Cardiovascular Disease | Admitting: Cardiovascular Disease

## 2013-08-09 ENCOUNTER — Encounter (HOSPITAL_COMMUNITY)
Admission: RE | Admit: 2013-08-09 | Discharge: 2013-08-09 | Disposition: A | Discharge status: Home / Self Care | Payer: Medicare Other | Source: Ambulatory Visit | Attending: Cardiovascular Disease | Admitting: Cardiovascular Disease

## 2013-08-12 ENCOUNTER — Encounter (HOSPITAL_COMMUNITY)
Admission: RE | Admit: 2013-08-12 | Discharge: 2013-08-12 | Disposition: A | Discharge status: Home / Self Care | Payer: Medicare Other | Source: Ambulatory Visit | Attending: Cardiovascular Disease | Admitting: Cardiovascular Disease

## 2013-08-14 ENCOUNTER — Encounter (HOSPITAL_COMMUNITY)
Admission: RE | Admit: 2013-08-14 | Discharge: 2013-08-14 | Disposition: A | Discharge status: Home / Self Care | Payer: Medicare Other | Source: Ambulatory Visit | Attending: Cardiovascular Disease | Admitting: Cardiovascular Disease

## 2013-08-16 ENCOUNTER — Encounter (HOSPITAL_COMMUNITY)
Admission: RE | Admit: 2013-08-16 | Discharge: 2013-08-16 | Disposition: A | Discharge status: Home / Self Care | Payer: Medicare Other | Source: Ambulatory Visit | Attending: Cardiovascular Disease | Admitting: Cardiovascular Disease

## 2013-08-19 ENCOUNTER — Encounter (HOSPITAL_COMMUNITY)
Admission: RE | Admit: 2013-08-19 | Discharge: 2013-08-19 | Disposition: A | Discharge status: Home / Self Care | Payer: Medicare Other | Source: Ambulatory Visit | Attending: Cardiovascular Disease | Admitting: Cardiovascular Disease

## 2013-08-19 LAB — CBC
HCT: 39.3 % (ref 39.0–52.0)
HEMOGLOBIN: 13.4 g/dL (ref 13.0–17.0)
MCH: 31.4 pg (ref 26.0–34.0)
MCHC: 34.1 g/dL (ref 30.0–36.0)
MCV: 92 fL (ref 78.0–100.0)
PLATELETS: 170 10*3/uL (ref 150–400)
RBC: 4.27 MIL/uL (ref 4.22–5.81)
RDW: 14.8 % (ref 11.5–15.5)
WBC: 6.8 10*3/uL (ref 4.0–10.5)

## 2013-08-19 LAB — IRON AND TIBC
%SAT: 18 % — AB (ref 20–55)
Iron: 59 ug/dL (ref 42–165)
TIBC: 335 ug/dL (ref 215–435)
UIBC: 276 ug/dL (ref 125–400)

## 2013-08-20 LAB — FERRITIN: Ferritin: 23 ng/mL (ref 22–322)

## 2013-08-21 ENCOUNTER — Encounter (HOSPITAL_COMMUNITY)
Admission: RE | Admit: 2013-08-21 | Discharge: 2013-08-21 | Disposition: A | Discharge status: Home / Self Care | Payer: Medicare Other | Source: Ambulatory Visit | Attending: Cardiovascular Disease | Admitting: Cardiovascular Disease

## 2013-08-23 ENCOUNTER — Encounter (HOSPITAL_COMMUNITY)
Admission: RE | Admit: 2013-08-23 | Discharge: 2013-08-23 | Disposition: A | Discharge status: Home / Self Care | Payer: Medicare Other | Source: Ambulatory Visit | Attending: Cardiovascular Disease | Admitting: Cardiovascular Disease

## 2013-08-26 ENCOUNTER — Encounter (HOSPITAL_COMMUNITY)
Admission: RE | Admit: 2013-08-26 | Discharge: 2013-08-26 | Disposition: A | Discharge status: Home / Self Care | Payer: Medicare Other | Source: Ambulatory Visit | Attending: Cardiovascular Disease | Admitting: Cardiovascular Disease

## 2013-08-27 ENCOUNTER — Telehealth: Payer: Self-pay | Admitting: Cardiovascular Disease

## 2013-08-27 DIAGNOSIS — D649 Anemia, unspecified: Secondary | ICD-10-CM

## 2013-08-27 DIAGNOSIS — R899 Unspecified abnormal finding in specimens from other organs, systems and tissues: Secondary | ICD-10-CM

## 2013-08-27 NOTE — Telephone Encounter (Signed)
Would like his lab results from about a week ago. He also wants to know if he  Needs to continue taking his iron medicine.

## 2013-08-27 NOTE — Telephone Encounter (Signed)
RN called no answer, left message on answer machine with instruction to continue  Iron supplement and recheck in 4 weeks per Dr Landry DykeKELLY'S result notes Mailed lab slip.

## 2013-08-28 ENCOUNTER — Encounter (HOSPITAL_COMMUNITY)
Admission: RE | Admit: 2013-08-28 | Discharge: 2013-08-28 | Disposition: A | Discharge status: Home / Self Care | Payer: Medicare Other | Source: Ambulatory Visit | Attending: Cardiovascular Disease | Admitting: Cardiovascular Disease

## 2013-08-30 ENCOUNTER — Encounter (HOSPITAL_COMMUNITY)
Admission: RE | Admit: 2013-08-30 | Discharge: 2013-08-30 | Disposition: A | Discharge status: Home / Self Care | Payer: Medicare Other | Source: Ambulatory Visit | Attending: Cardiovascular Disease | Admitting: Cardiovascular Disease

## 2013-08-30 DIAGNOSIS — Z8249 Family history of ischemic heart disease and other diseases of the circulatory system: Secondary | ICD-10-CM | POA: Insufficient documentation

## 2013-08-30 DIAGNOSIS — K219 Gastro-esophageal reflux disease without esophagitis: Secondary | ICD-10-CM | POA: Insufficient documentation

## 2013-08-30 DIAGNOSIS — E785 Hyperlipidemia, unspecified: Secondary | ICD-10-CM | POA: Insufficient documentation

## 2013-08-30 DIAGNOSIS — E8779 Other fluid overload: Secondary | ICD-10-CM | POA: Insufficient documentation

## 2013-08-30 DIAGNOSIS — D62 Acute posthemorrhagic anemia: Secondary | ICD-10-CM | POA: Insufficient documentation

## 2013-08-30 DIAGNOSIS — Z87891 Personal history of nicotine dependence: Secondary | ICD-10-CM | POA: Insufficient documentation

## 2013-08-30 DIAGNOSIS — D696 Thrombocytopenia, unspecified: Secondary | ICD-10-CM | POA: Insufficient documentation

## 2013-08-30 DIAGNOSIS — I2510 Atherosclerotic heart disease of native coronary artery without angina pectoris: Secondary | ICD-10-CM | POA: Insufficient documentation

## 2013-08-30 DIAGNOSIS — Z7982 Long term (current) use of aspirin: Secondary | ICD-10-CM | POA: Insufficient documentation

## 2013-08-30 DIAGNOSIS — I359 Nonrheumatic aortic valve disorder, unspecified: Secondary | ICD-10-CM | POA: Insufficient documentation

## 2013-08-30 DIAGNOSIS — I2789 Other specified pulmonary heart diseases: Secondary | ICD-10-CM | POA: Insufficient documentation

## 2013-08-30 DIAGNOSIS — I10 Essential (primary) hypertension: Secondary | ICD-10-CM | POA: Insufficient documentation

## 2013-08-30 DIAGNOSIS — J9819 Other pulmonary collapse: Secondary | ICD-10-CM | POA: Insufficient documentation

## 2013-08-30 DIAGNOSIS — Z5189 Encounter for other specified aftercare: Secondary | ICD-10-CM | POA: Insufficient documentation

## 2013-09-02 ENCOUNTER — Encounter (HOSPITAL_COMMUNITY)
Admission: RE | Admit: 2013-09-02 | Discharge: 2013-09-02 | Disposition: A | Discharge status: Home / Self Care | Payer: Medicare Other | Source: Ambulatory Visit | Attending: Cardiovascular Disease | Admitting: Cardiovascular Disease

## 2013-09-04 ENCOUNTER — Encounter (HOSPITAL_COMMUNITY)
Admission: RE | Admit: 2013-09-04 | Discharge: 2013-09-04 | Disposition: A | Discharge status: Home / Self Care | Payer: Medicare Other | Source: Ambulatory Visit | Attending: Cardiovascular Disease | Admitting: Cardiovascular Disease

## 2013-09-04 NOTE — Progress Notes (Signed)
Pt will complete 36 exercise sessions in the cardiac rehab phase II program on 09/06/13.  Pt maintained excellent attendance to exercise and education sessions.  Pt feels confident with his exercise and has already began riding his bike.  Pt's long term goal is to increase exercise capacity.  Pt cautioned however to avoid prolonged strenuous activity with high intensity.  Pt observed in the rehab sessions to exercise aggressively and often would have to decrease pace/workloads due to exceeding THR and elevation in bp.  Pt has a fit bit watch and will be able to monitor his heart rate during exercise to stay within his target range.  Pt understands the target heart rate guidelines but admits "he likes to test things out". Strongly cautioned pt to refrain from excessive intensity while walking in his neighborhood for exercise.  Pt verbalized that he understood and plans to alternate his walking and yard work duties.  Pt may exercise in the cardiac rehab maintenance program during the winter months - his preferred exercise is walking.  Pt long term goal to increase exercise capacity which he also achieved.  Pt increased from 4.1 MET level to 5.8 during his participation.  Medication list reconciled.  Repeat PHQ2 score 0. Cherre Huger, BSN

## 2013-09-06 ENCOUNTER — Encounter (HOSPITAL_COMMUNITY)
Admission: RE | Admit: 2013-09-06 | Discharge: 2013-09-06 | Disposition: A | Discharge status: Home / Self Care | Payer: Medicare Other | Source: Ambulatory Visit | Attending: Cardiovascular Disease | Admitting: Cardiovascular Disease

## 2013-09-27 LAB — IRON AND TIBC
%SAT: 36 % (ref 20–55)
Iron: 121 ug/dL (ref 42–165)
TIBC: 338 ug/dL (ref 215–435)
UIBC: 217 ug/dL (ref 125–400)

## 2013-09-27 LAB — FERRITIN: Ferritin: 37 ng/mL (ref 22–322)

## 2013-10-09 ENCOUNTER — Encounter (HOSPITAL_COMMUNITY): Payer: Self-pay | Admitting: Emergency Medicine

## 2013-10-09 ENCOUNTER — Emergency Department (HOSPITAL_COMMUNITY): Payer: Medicare Other

## 2013-10-09 ENCOUNTER — Emergency Department (HOSPITAL_COMMUNITY)
Admission: EM | Admit: 2013-10-09 | Discharge: 2013-10-09 | Disposition: A | Discharge status: Home / Self Care | Payer: Medicare Other | Attending: Emergency Medicine | Admitting: Emergency Medicine

## 2013-10-09 DIAGNOSIS — Z954 Presence of other heart-valve replacement: Secondary | ICD-10-CM | POA: Insufficient documentation

## 2013-10-09 DIAGNOSIS — IMO0002 Reserved for concepts with insufficient information to code with codable children: Secondary | ICD-10-CM

## 2013-10-09 DIAGNOSIS — S0083XA Contusion of other part of head, initial encounter: Secondary | ICD-10-CM | POA: Insufficient documentation

## 2013-10-09 DIAGNOSIS — S0003XA Contusion of scalp, initial encounter: Secondary | ICD-10-CM | POA: Insufficient documentation

## 2013-10-09 DIAGNOSIS — S0180XA Unspecified open wound of other part of head, initial encounter: Secondary | ICD-10-CM

## 2013-10-09 DIAGNOSIS — Z792 Long term (current) use of antibiotics: Secondary | ICD-10-CM | POA: Insufficient documentation

## 2013-10-09 DIAGNOSIS — Z23 Encounter for immunization: Secondary | ICD-10-CM | POA: Insufficient documentation

## 2013-10-09 DIAGNOSIS — S46909A Unspecified injury of unspecified muscle, fascia and tendon at shoulder and upper arm level, unspecified arm, initial encounter: Secondary | ICD-10-CM | POA: Insufficient documentation

## 2013-10-09 DIAGNOSIS — Y9389 Activity, other specified: Secondary | ICD-10-CM | POA: Insufficient documentation

## 2013-10-09 DIAGNOSIS — K219 Gastro-esophageal reflux disease without esophagitis: Secondary | ICD-10-CM | POA: Insufficient documentation

## 2013-10-09 DIAGNOSIS — Z79899 Other long term (current) drug therapy: Secondary | ICD-10-CM | POA: Insufficient documentation

## 2013-10-09 DIAGNOSIS — E785 Hyperlipidemia, unspecified: Secondary | ICD-10-CM | POA: Insufficient documentation

## 2013-10-09 DIAGNOSIS — Y92410 Unspecified street and highway as the place of occurrence of the external cause: Secondary | ICD-10-CM | POA: Insufficient documentation

## 2013-10-09 DIAGNOSIS — Z7982 Long term (current) use of aspirin: Secondary | ICD-10-CM | POA: Insufficient documentation

## 2013-10-09 DIAGNOSIS — T148XXA Other injury of unspecified body region, initial encounter: Secondary | ICD-10-CM

## 2013-10-09 DIAGNOSIS — I10 Essential (primary) hypertension: Secondary | ICD-10-CM | POA: Insufficient documentation

## 2013-10-09 DIAGNOSIS — Z87891 Personal history of nicotine dependence: Secondary | ICD-10-CM | POA: Insufficient documentation

## 2013-10-09 DIAGNOSIS — S51009A Unspecified open wound of unspecified elbow, initial encounter: Secondary | ICD-10-CM | POA: Insufficient documentation

## 2013-10-09 DIAGNOSIS — S4980XA Other specified injuries of shoulder and upper arm, unspecified arm, initial encounter: Secondary | ICD-10-CM | POA: Insufficient documentation

## 2013-10-09 DIAGNOSIS — S1093XA Contusion of unspecified part of neck, initial encounter: Secondary | ICD-10-CM

## 2013-10-09 MED ORDER — TETANUS-DIPHTH-ACELL PERTUSSIS 5-2.5-18.5 LF-MCG/0.5 IM SUSP
0.5000 mL | Freq: Once | INTRAMUSCULAR | Status: AC
  Administered 2013-10-09: 0.5 mL via INTRAMUSCULAR
  Filled 2013-10-09: qty 0.5

## 2013-10-09 MED ORDER — LIDOCAINE VISCOUS 2 % MT SOLN
15.0000 mL | Freq: Once | OROMUCOSAL | Status: AC
  Administered 2013-10-09: 15 mL via OROMUCOSAL
  Filled 2013-10-09: qty 15

## 2013-10-09 MED ORDER — HYDROGEN PEROXIDE 3 % EX SOLN
CUTANEOUS | Status: AC
  Filled 2013-10-09: qty 473

## 2013-10-09 MED ORDER — HYDROCODONE-ACETAMINOPHEN 5-325 MG PO TABS: 2.0000 | ORAL_TABLET | ORAL | Status: DC | PRN

## 2013-10-09 MED ORDER — DOXYCYCLINE HYCLATE 100 MG PO CAPS: 100.0000 mg | ORAL_CAPSULE | Freq: Two times a day (BID) | ORAL | Status: DC

## 2013-10-09 NOTE — ED Notes (Signed)
Dressing supplies given for 24 hours - pt and family understood dressing changes

## 2013-10-09 NOTE — ED Notes (Addendum)
MD at bedside.  EDP [place] at [facility] to evaluate lacerations to face, rt elbow and all abrasions to arms legs. Rachael NT assisting

## 2013-10-09 NOTE — ED Notes (Signed)
Family at bedside. 
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
## 2013-10-09 NOTE — ED Notes (Signed)
MD at bedside.  EDP POLLINA BACK TO SUTURE RT ELBOW

## 2013-10-09 NOTE — Discharge Instructions (Signed)
Abrasion °An abrasion is a cut or scrape of the skin. Abrasions do not extend through all layers of the skin and most heal within 10 days. It is important to care for your abrasion properly to prevent infection. °CAUSES  °Most abrasions are caused by falling on, or gliding across, the ground or other surface. When your skin rubs on something, the outer and inner layer of skin rubs off, causing an abrasion. °DIAGNOSIS  °Your caregiver will be able to diagnose an abrasion during a physical exam.  °TREATMENT  °Your treatment depends on how large and deep the abrasion is. Generally, your abrasion will be cleaned with water and a mild soap to remove any dirt or debris. An antibiotic ointment may be put over the abrasion to prevent an infection. A bandage (dressing) may be wrapped around the abrasion to keep it from getting dirty.  °You may need a tetanus shot if: °· You cannot remember when you had your last tetanus shot. °· You have never had a tetanus shot. °· The injury broke your skin. °If you get a tetanus shot, your arm may swell, get red, and feel warm to the touch. This is common and not a problem. If you need a tetanus shot and you choose not to have one, there is a rare chance of getting tetanus. Sickness from tetanus can be serious.  °HOME CARE INSTRUCTIONS  °· If a dressing was applied, change it at least once a day or as directed by your caregiver. If the bandage sticks, soak it off with warm water.   °· Wash the area with water and a mild soap to remove all the ointment 2 times a day. Rinse off the soap and pat the area dry with a clean towel.   °· Reapply any ointment as directed by your caregiver. This will help prevent infection and keep the bandage from sticking. Use gauze over the wound and under the dressing to help keep the bandage from sticking.   °· Change your dressing right away if it becomes wet or dirty.   °· Only take over-the-counter or prescription medicines for pain, discomfort, or fever as  directed by your caregiver.   °· Follow up with your caregiver within 24 48 hours for a wound check, or as directed. If you were not given a wound-check appointment, look closely at your abrasion for redness, swelling, or pus. These are signs of infection. °SEEK IMMEDIATE MEDICAL CARE IF:  °· You have increasing pain in the wound.   °· You have redness, swelling, or tenderness around the wound.   °· You have pus coming from the wound.   °· You have a fever or persistent symptoms for more than 2 3 days. °· You have a fever and your symptoms suddenly get worse. °· You have a bad smell coming from the wound or dressing.   °MAKE SURE YOU:  °· Understand these instructions. °· Will watch your condition. °· Will get help right away if you are not doing well or get worse. °Document Released: 01/26/2005 Document Revised: 04/04/2012 Document Reviewed: 03/22/2011 °[employer]® Patient Information ©2014 [employer]. ° °Contusion °A contusion is a deep bruise. Contusions are the result of an injury that caused bleeding under the skin. The contusion may turn blue, purple, or yellow. Minor injuries will give you a painless contusion, but more severe contusions may stay painful and swollen for a few weeks.  °CAUSES  °A contusion is usually caused by a blow, trauma, or direct force to an area of the body. °SYMPTOMS  °·   Swelling and redness of the injured area.  Bruising of the injured area.  Tenderness and soreness of the injured area.  Pain. DIAGNOSIS  The diagnosis can be made by taking a history and physical exam. An X-ray, CT scan, or MRI may be needed to determine if there were any associated injuries, such as fractures. TREATMENT  Specific treatment will depend on what area of the body was injured. In general, the best treatment for a contusion is resting, icing, elevating, and applying cold compresses to the injured area. Over-the-counter medicines may also be recommended for pain control. Ask your caregiver what  the best treatment is for your contusion. HOME CARE INSTRUCTIONS   Put ice on the injured area.  Put ice in a plastic bag.  Place a towel between your skin and the bag.  Leave the ice on for 15-20 minutes, 03-04 times a day.  Only take over-the-counter or prescription medicines for pain, discomfort, or fever as directed by your caregiver. Your caregiver may recommend avoiding anti-inflammatory medicines (aspirin, ibuprofen, and naproxen) for 48 hours because these medicines may increase bruising.  Rest the injured area.  If possible, elevate the injured area to reduce swelling. SEEK IMMEDIATE MEDICAL CARE IF:   You have increased bruising or swelling.  You have pain that is getting worse.  Your swelling or pain is not relieved with medicines. MAKE SURE YOU:   Understand these instructions.  Will watch your condition.  Will get help right away if you are not doing well or get worse. Document Released: 01/26/2005 Document Revised: 07/11/2011 Document Reviewed: 02/21/2011 [facility] Patient Information 2014 [place], [place].  Facial Laceration  A facial laceration is a cut on the face. These injuries can be painful and cause bleeding. Lacerations usually heal quickly, but they need special care to reduce scarring. DIAGNOSIS  Your health care provider will take a medical history, ask for details about how the injury occurred, and examine the wound to determine how deep the cut is. TREATMENT  Some facial lacerations may not require closure. Others may not be able to be closed because of an increased risk of infection. The risk of infection and the chance for successful closure will depend on various factors, including the amount of time since the injury occurred. The wound may be cleaned to help prevent infection. If closure is appropriate, pain medicines may be given if needed. Your health care provider will use stitches (sutures), wound glue (adhesive), or skin adhesive strips to  repair the laceration. These tools bring the skin edges together to allow for faster healing and a better cosmetic outcome. If needed, you may also be given a tetanus shot. HOME CARE INSTRUCTIONS  Only take over-the-counter or prescription medicines as directed by your health care provider.  Follow your health care provider's instructions for wound care. These instructions will vary depending on the technique used for closing the wound. For Sutures:  Keep the wound clean and dry.   If you were given a bandage (dressing), you should change it at least once a day. Also change the dressing if it becomes wet or dirty, or as directed by your health care provider.   Wash the wound with soap and water 2 times a day. Rinse the wound off with water to remove all soap. Pat the wound dry with a clean towel.   After cleaning, apply a thin layer of the antibiotic ointment recommended by your health care provider. This will help prevent infection and keep the dressing from  sticking.   You may shower as usual after the first 24 hours. Do not soak the wound in water until the sutures are removed.   Get your sutures removed as directed by your health care provider. With facial lacerations, sutures should usually be taken out after 4 5 days to avoid stitch marks.   Wait a few days after your sutures are removed before applying any makeup. For Skin Adhesive Strips:  Keep the wound clean and dry.   Do not get the skin adhesive strips wet. You may bathe carefully, using caution to keep the wound dry.   If the wound gets wet, pat it dry with a clean towel.   Skin adhesive strips will fall off on their own. You may trim the strips as the wound heals. Do not remove skin adhesive strips that are still stuck to the wound. They will fall off in time.  For Wound Adhesive:  You may briefly wet your wound in the shower or bath. Do not soak or scrub the wound. Do not swim. Avoid periods of heavy sweating  until the skin adhesive has fallen off on its own. After showering or bathing, gently pat the wound dry with a clean towel.   Do not apply liquid medicine, cream medicine, ointment medicine, or makeup to your wound while the skin adhesive is in place. This may loosen the film before your wound is healed.   If a dressing is placed over the wound, be careful not to apply tape directly over the skin adhesive. This may cause the adhesive to be pulled off before the wound is healed.   Avoid prolonged exposure to sunlight or tanning lamps while the skin adhesive is in place.  The skin adhesive will usually remain in place for 5 10 days, then naturally fall off the skin. Do not pick at the adhesive film.  After Healing: Once the wound has healed, cover the wound with sunscreen during the day for 1 full year. This can help minimize scarring. Exposure to ultraviolet light in the first year will darken the scar. It can take 1 2 years for the scar to lose its redness and to heal completely.  SEEK IMMEDIATE MEDICAL CARE IF:  You have redness, pain, or swelling around the wound.   You see ayellowish-white fluid (pus) coming from the wound.   You have chills or a fever.  MAKE SURE YOU:  Understand these instructions.  Will watch your condition.  Will get help right away if you are not doing well or get worse. Document Released: 05/26/2004 Document Revised: 02/06/2013 Document Reviewed: 11/29/2012 [facility] Patient Information 2014 [place], [place].  Laceration Care, Adult A laceration is a cut or lesion that goes through all layers of the skin and into the tissue just beneath the skin. TREATMENT  Some lacerations may not require closure. Some lacerations may not be able to be closed due to an increased risk of infection. It is important to see your caregiver as soon as possible after an injury to minimize the risk of infection and maximize the opportunity for successful closure. If closure is  appropriate, pain medicines may be given, if needed. The wound will be cleaned to help prevent infection. Your caregiver will use stitches (sutures), staples, wound glue (adhesive), or skin adhesive strips to repair the laceration. These tools bring the skin edges together to allow for faster healing and a better cosmetic outcome. However, all wounds will heal with a scar. Once the wound has healed,  scarring can be minimized by covering the wound with sunscreen during the day for 1 full year. HOME CARE INSTRUCTIONS  For sutures or staples:  Keep the wound clean and dry.  If you were given a bandage (dressing), you should change it at least once a day. Also, change the dressing if it becomes wet or dirty, or as directed by your caregiver.  Wash the wound with soap and water 2 times a day. Rinse the wound off with water to remove all soap. Pat the wound dry with a clean towel.  After cleaning, apply a thin layer of the antibiotic ointment as recommended by your caregiver. This will help prevent infection and keep the dressing from sticking.  You may shower as usual after the first 24 hours. Do not soak the wound in water until the sutures are removed.  Only take over-the-counter or prescription medicines for pain, discomfort, or fever as directed by your caregiver.  Get your sutures or staples removed as directed by your caregiver. For skin adhesive strips:  Keep the wound clean and dry.  Do not get the skin adhesive strips wet. You may bathe carefully, using caution to keep the wound dry.  If the wound gets wet, pat it dry with a clean towel.  Skin adhesive strips will fall off on their own. You may trim the strips as the wound heals. Do not remove skin adhesive strips that are still stuck to the wound. They will fall off in time. For wound adhesive:  You may briefly wet your wound in the shower or bath. Do not soak or scrub the wound. Do not swim. Avoid periods of heavy perspiration until  the skin adhesive has fallen off on its own. After showering or bathing, gently pat the wound dry with a clean towel.  Do not apply liquid medicine, cream medicine, or ointment medicine to your wound while the skin adhesive is in place. This may loosen the film before your wound is healed.  If a dressing is placed over the wound, be careful not to apply tape directly over the skin adhesive. This may cause the adhesive to be pulled off before the wound is healed.  Avoid prolonged exposure to sunlight or tanning lamps while the skin adhesive is in place. Exposure to ultraviolet light in the first year will darken the scar.  The skin adhesive will usually remain in place for 5 to 10 days, then naturally fall off the skin. Do not pick at the adhesive film. You may need a tetanus shot if:  You cannot remember when you had your last tetanus shot.  You have never had a tetanus shot. If you get a tetanus shot, your arm may swell, get red, and feel warm to the touch. This is common and not a problem. If you need a tetanus shot and you choose not to have one, there is a rare chance of getting tetanus. Sickness from tetanus can be serious. SEEK MEDICAL CARE IF:   You have redness, swelling, or increasing pain in the wound.  You see a red line that goes away from the wound.  You have yellowish-white fluid (pus) coming from the wound.  You have a fever.  You notice a bad smell coming from the wound or dressing.  Your wound breaks open before or after sutures have been removed.  You notice something coming out of the wound such as wood or glass.  Your wound is on your hand or foot and you  cannot move a finger or toe. SEEK IMMEDIATE MEDICAL CARE IF:   Your pain is not controlled with prescribed medicine.  You have severe swelling around the wound causing pain and numbness or a change in color in your arm, hand, leg, or foot.  Your wound splits open and starts bleeding.  You have worsening  numbness, weakness, or loss of function of any joint around or beyond the wound.  You develop painful lumps near the wound or on the skin anywhere on your body. MAKE SURE YOU:   Understand these instructions.  Will watch your condition.  Will get help right away if you are not doing well or get worse. Document Released: 04/18/2005 Document Revised: 07/11/2011 Document Reviewed: 10/12/2010 [facility] Patient Information 2014 [place], [place].

## 2013-10-09 NOTE — ED Notes (Signed)
Patient transported to CT 
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
## 2013-12-23 ENCOUNTER — Ambulatory Visit (HOSPITAL_COMMUNITY)
Admission: RE | Admit: 2013-12-23 | Discharge: 2013-12-23 | Disposition: A | Discharge status: Home / Self Care | Payer: Medicare Other | Source: Ambulatory Visit | Attending: Cardiovascular Disease | Admitting: Cardiovascular Disease

## 2013-12-23 DIAGNOSIS — I517 Cardiomegaly: Secondary | ICD-10-CM | POA: Diagnosis not present

## 2013-12-23 DIAGNOSIS — I359 Nonrheumatic aortic valve disorder, unspecified: Secondary | ICD-10-CM | POA: Diagnosis not present

## 2013-12-23 DIAGNOSIS — I519 Heart disease, unspecified: Secondary | ICD-10-CM | POA: Diagnosis not present

## 2013-12-23 DIAGNOSIS — Z954 Presence of other heart-valve replacement: Secondary | ICD-10-CM | POA: Diagnosis not present

## 2013-12-23 DIAGNOSIS — I350 Nonrheumatic aortic (valve) stenosis: Secondary | ICD-10-CM

## 2013-12-23 NOTE — Progress Notes (Signed)
2D Echo Performed 12/23/2013    Jostin Rue, RCS  

## 2013-12-27 ENCOUNTER — Encounter: Payer: Self-pay | Admitting: *Deleted

## 2014-02-19 ENCOUNTER — Ambulatory Visit (INDEPENDENT_AMBULATORY_CARE_PROVIDER_SITE_OTHER): Payer: Medicare Other | Admitting: Cardiovascular Disease

## 2014-02-19 ENCOUNTER — Encounter: Payer: Self-pay | Admitting: Cardiovascular Disease

## 2014-02-19 VITALS — BP 120/78 | HR 57 | Ht 69.0 in | Wt 182.7 lb

## 2014-02-19 DIAGNOSIS — I350 Nonrheumatic aortic (valve) stenosis: Secondary | ICD-10-CM

## 2014-02-19 DIAGNOSIS — Z954 Presence of other heart-valve replacement: Secondary | ICD-10-CM

## 2014-02-19 DIAGNOSIS — Z952 Presence of prosthetic heart valve: Secondary | ICD-10-CM

## 2014-02-19 DIAGNOSIS — I359 Nonrheumatic aortic valve disorder, unspecified: Secondary | ICD-10-CM

## 2014-02-19 DIAGNOSIS — I10 Essential (primary) hypertension: Secondary | ICD-10-CM

## 2014-02-19 DIAGNOSIS — H532 Diplopia: Secondary | ICD-10-CM

## 2014-02-19 DIAGNOSIS — I5189 Other ill-defined heart diseases: Secondary | ICD-10-CM

## 2014-02-19 DIAGNOSIS — I519 Heart disease, unspecified: Secondary | ICD-10-CM

## 2014-02-19 DIAGNOSIS — E785 Hyperlipidemia, unspecified: Secondary | ICD-10-CM

## 2014-02-19 NOTE — Progress Notes (Signed)
Patient ID: Drew Andrews, male   DOB: 04/09/1946, 68 y.o.   MRN: [id number]     HPI: Drew Andrews is a 68 y.o. male who presents for cardiology followup evaluation following his aortic valve replacement for severe aortic valve stenosis.  Drew Andrews  was first told of having a heart murmur back in the 1980s when Drew Andrews was planning to work at  [name] and had a physical exam done at that time. I have been seeing him since 2005 when Drew Andrews was referred to me by Dr. [name] for cardiac murmur. Since 2005,  I followed him closely and Drew Andrews underwent followup echo Doppler studies to assess his aortic valve. In 2005 his aortic transvalvular gradient was 37 with a mean gradient of 18. Over the last 9 years, his aortic valve murmur has gradually become more significant such that last year in September 2013 Drew Andrews had a mean gradient of 41 with a peak instantaneous gradient of 73. At that time Drew Andrews remained entirely asymptomatic and was exercising 7 days per week and typically riding his bicycle anywhere from 40 minutes to an hour and a half without abnormalities. A subsequent Doppler study in March 2014 showed moderate concentric LVH with grade 2 diastolic dysfunction; Mean gradient was 40 and peak gradient 71 and calculated valve area was 0.9 cm. Over the years I have informed him of any symptoms that can occur as a result of this aortic stenosis. Drew Andrews recently had a six-month followup echo Doppler assessment on 03/04/2013. Ejection fraction again remained 55-60%. Drew Andrews did have grade 2 diastolic dysfunction. His aortic valve again was moderately calcified. The stenosis is felt to be severe with mild aortic regurgitation. On his most recent echo, mean gradient is now 47 and peak instantaneous gradient is 86 giving a calculated aortic valve area of 0.63 cm. Drew Andrews did have mitral annular calcification. His left atrium was moderately dilated. Drew Andrews did have very mild RV dilatation.  When I saw him in November  2014 Drew Andrews was continuing to exercise but there was new development of exertional shortness of breath. At that time, I strongly recommended cardiac catheterization and Drew Andrews underwent right and left heart cardiac catheterization on 03/15/2013. This confirmed severe aortic valve stenosis mild pulmonary hypertension. Drew Andrews had mild chronic calcification but nonobstructive 20% narrowing in the LAD.   Drew Andrews underwent successful aortic valve replacement surgery by Dr. Evelene CroonBryan Bartle 05/13/2013 and [facility] a [facility] bovine pericardial valve inserted. Socially, Drew Andrews has done remarkably well following his valve replacement surgery. Drew Andrews denies any episodes of palpitations. Drew Andrews has resumed activity.  I saw for initial followup evaluation.  In March 2015.  Over the past 7 months, Drew Andrews has continued to increase his activity and feels well.  Drew Andrews is now typically going on 20 mile bike rides and denies any shortness of breath or chest pain.  However, Drew Andrews has had several rare occurrences of diplopia it are short lived.  Drew Andrews has been taking aspirin 81 mg daily.  Drew Andrews underwent a repeat echo Doppler study on 12/23/2013.  There was evidence for normal.  Systolic function with an ejection fraction of 55-60% with mild LVH.  His aortic prosthesis was well-seated and open well.  There was only trivial aortic insufficiency.  Drew Andrews did have mild dilatation of his left atrium and mitral annular calcification.  There also was some dilatation of his RV.  Drew Andrews denies shortness.  Drew Andrews denies PND, orthopnea.  Past Medical History  Diagnosis Date  .  Seasonal allergies   . GERD (gastroesophageal reflux disease)   . LVH (left ventricular hypertrophy)     with aortic stenosis-bicuspid  . Hyperlipidemia   . Hypertension   . PONV (postoperative nausea and vomiting)     as a child  . S/P AVR (aortic valve replacement) 05/13/2013    Past Surgical History  Procedure Laterality Date  . Appendectomy  age 89  . Tonsillectomy    . Hiatal hernia  repair    . Colonoscopy      X 2  . Cardiac catheterization    . Aortic valve replacement N/A 05/13/2013    Procedure: AORTIC VALVE REPLACEMENT (AVR);  Surgeon: Alleen BorneBryan K Bartle, MD;  [facility];  Service: Open Heart Surgery;  Laterality: N/A;  . Intraoperative transesophageal echocardiogram N/A 05/13/2013    Procedure: INTRAOPERATIVE TRANSESOPHAGEAL ECHOCARDIOGRAM;  Surgeon: Alleen BorneBryan K Bartle, MD;  [facility];  Service: Open Heart Surgery;  Laterality: N/A;  . Transthoracic echocardiogram  03/04/2013    EF 55-60%, grade 2 diastolic dysfunction, AV with mod calcified leaflets & mild regurg, calcified MV annulus, LA mod dilated, RV mildly dilated    Allergies  Allergen Reactions  . Scopolamine     seizure    Current Outpatient Prescriptions  Medication Sig Dispense Refill  . amoxicillin (AMOXIL) 500 MG tablet Take 4 tablets 1 hour prior to dental procedure  4 tablet  11  . aspirin 81 MG tablet Take 81 mg by mouth daily.      [name]. HYDROcodone-acetaminophen (NORCO/VICODIN) 5-325 MG per tablet Take 2 tablets by mouth every 4 (four) hours as needed for moderate pain.  20 tablet  0  . Ibuprofen-Diphenhydramine Cit (ADVIL PM PO) Take 2-3 tablets by mouth at bedtime as needed (for sleep).       [name]. lisinopril (PRINIVIL,ZESTRIL) 5 MG tablet Take 5 mg by mouth at bedtime.       . meloxicam (MOBIC) 15 MG tablet Take 1 tablet by mouth as needed for pain.       . metoprolol succinate (TOPROL-XL) 50 MG 24 hr tablet Take 1 tablet (50 mg total) by mouth daily. Take with or immediately following a meal.  90 tablet  3  . omeprazole (PRILOSEC OTC) 20 MG tablet Take 20 mg by mouth daily.       . traMADol (ULTRAM) 50 MG tablet Take 50 mg by mouth as needed.      [name]. VYTORIN 10-40 MG per tablet Take 1 tablet by mouth at bedtime.        No current facility-administered medications for this visit.    History   Social History  . Marital Status: Married    Spouse Name: N/A    Number of Children: 1  . Years  of Education: college   Occupational History  . Not on file.   Social History Main Topics  . Smoking status: Former Smoker -- 1.00 packs/day    Quit date: 02/10/1974  . Smokeless tobacco: Never Used  . Alcohol Use: Yes     Comment: Drinks 1 bottle of wine daily and 1 beer  . Drug Use: No  . Sexual Activity: Not on file   Other Topics Concern  . Not on file   Social History Narrative  . 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 Drew Andrews is retired from [name]. Drew Andrews quit tobacco in 1979. Drew Andrews continues to exercise 7 days  per week.  Family History  Problem Relation Age of Onset  . Colon cancer Neg Hx   . Stomach cancer Neg Hx   . Heart attack Father   . Acute myelogenous leukemia Brother   . Hypertension Mother   . Hyperlipidemia Mother   . Diabetes Mother   . Pneumonia Maternal Grandmother   . Heart attack Maternal Grandfather    ROS General: Negative; No fevers, chills, or night sweats;  HEENT: Negative; No changes in vision or hearing, sinus congestion, difficulty swallowing Pulmonary: Negative; No cough, wheezing, shortness of breath, hemoptysis Cardiovascular: Negative; No chest pain, presyncope, syncope, palpitations GI: Negative; No nausea, vomiting, diarrhea, or abdominal pain GU: Negative; No dysuria, hematuria, or difficulty voiding Musculoskeletal: Mild arthritic issues of his elbows and knees; no myalgias, joint pain, or weakness Hematologic/Oncology: Negative; no easy bruising, bleeding Endocrine: Negative; no heat/cold intolerance; no diabetes Neuro: Rare episodes of diplopia; no changes in balance, headaches Skin: Negative; No rashes or skin lesions Psychiatric: Negative; No behavioral problems, depression Sleep: Negative; No snoring, daytime sleepiness, hypersomnolence, bruxism, restless legs, hypnogognic hallucinations, no cataplexy Other comprehensive 14 point system  review is negative.   PE BP 120/78  Pulse 57  Ht 5\' 9"  (1.753 m)  Wt 182 lb 11.2 oz (82.872 kg)  BMI 26.97 kg/m2  Repeat blood pressure was 138/76. There was no orthostatic change. General: Alert, oriented, no distress.  Skin: normal turgor, no rashes HEENT: Normocephalic, atraumatic. Pupils round and reactive; sclera anicteric;no lid lag.  Nose without nasal septal hypertrophy Mouth/Parynx benign; Mallinpatti scale 2 Neck: No JVD; transmitted murmurs to his carotids bilaterally Lungs: clear to ausculatation and percussion; no wheezing or rales No chest wall tenderness to palpation Heart: RRR, s1 s2 norma 2/6 systolic murmur concordant with his aortic valve replacement. No AR murmur. No S3 or S4 gallop. No rubs thrills or heaves.  Abdomen: soft, nontender; no hepatosplenomehaly, BS+; abdominal aorta nontender and not dilated by palpation. Back: No CVA tenderness Pulses 2+ Femoral pulses 2+ without bruits. Extremities: no clubbing cyanosis or edema, Homan's sign negative  Neurologic: grossly nonfocal; normal motor strength Psychologic: normal affect and mood.  ECG disease independently read by me): Normal sinus rhythm at 57 beats per minute.  Mild first degree AV block.  Prior 07/08/2013 EKG (independently read by the) normal sinus rhythm. Non-specific ST-T changes inferolaterally.   Prior 03/12/13 ECG: Sinus rhythm at 66 beats per minute. First-degree AV block. LVH by voltage criteria. Mild nondiagnostic T changes.  LABS:  BMET    Component Value Date/Time   NA 139 07/08/2013 0856     Hepatic Function Panel     Component Value Date/Time   PROT 6.5 07/08/2013 0856     CBC    Component Value Date/Time   WBC 6.8 08/19/2013 0920     BNP No results found for this basename: probnp    Lipid Panel     Component Value Date/Time   CHOL 122 07/08/2013 0856     RADIOLOGY: No results found.    ASSESSMENT AND PLAN: Drew Andrews  developed progressive severe  aortic valve stenosis and underwent successful bovine pericardial tissue valve replacement on 05/13/2013. Drew Andrews is now 9 months following successful surgery. Drew Andrews states that after 5 weeks Drew Andrews began to notice significant improvement from his prior symptomatology.  Drew Andrews is now exercising regularly and riding his bike for at least 20 mile bike rides without symptomatology.  His blood pressure today is stable at 120/78.  Drew Andrews did not have  coronary disease.  Drew Andrews is on lisinopril 5 mg as well as Toprol-XL 50 mg.  I have recommended that Drew Andrews can try to decrease his Toprol from 50 mg to 25 mg. Drew Andrews also is on Vytorin 10/40 for hyperlipidemia, and has been taking aspirin daily.  I reviewed his most recent echo Doppler study with him in detail.  This demonstrates normal bioprosthetic aortic valve function.  I also discussed with him recent articles in the newspaper, which have identified possible thrombi on even bioprosthetic valves.  Presently, Drew Andrews is on aspirin.  Drew Andrews has noticed rare diplopia.  If Drew Andrews notes recurrent symptomatology  I would recommend neurologic evaluation and consideration for possible additional antiplatelet therapy.  I have recommended that next year we repeat his echo Doppler study and I will see him for followup evaluation.  I answered all his questions in detail.  Time spent: 30 minutes  Lennette Bihari, MD, [facility]  02/19/2014 5:25 PM

## 2014-02-19 NOTE — Patient Instructions (Addendum)
Your physician wants you to follow-up appointment and echo in: 1 year or sooner if needed. You will receive a reminder letter in the mail two months in advance. If you don't receive a letter, please call our office to schedule the follow-up appointment.

## 2014-02-21 ENCOUNTER — Encounter: Payer: Self-pay | Admitting: Cardiovascular Disease

## 2014-02-21 DIAGNOSIS — H532 Diplopia: Secondary | ICD-10-CM | POA: Insufficient documentation

## 2014-04-10 ENCOUNTER — Encounter (HOSPITAL_COMMUNITY): Payer: Self-pay | Admitting: Cardiovascular Disease

## 2014-05-06 ENCOUNTER — Telehealth: Payer: Self-pay | Admitting: *Deleted

## 2014-05-06 NOTE — Telephone Encounter (Signed)
Returned signed  [facility] referral order to  cardiac rehab.

## 2014-05-12 ENCOUNTER — Encounter (HOSPITAL_COMMUNITY)
Admission: RE | Admit: 2014-05-12 | Discharge: 2014-05-12 | Disposition: A | Discharge status: Home / Self Care | Payer: Self-pay | Source: Ambulatory Visit | Attending: Cardiovascular Disease | Admitting: Cardiovascular Disease

## 2014-05-12 DIAGNOSIS — Z952 Presence of prosthetic heart valve: Secondary | ICD-10-CM | POA: Insufficient documentation

## 2014-05-12 DIAGNOSIS — Z5189 Encounter for other specified aftercare: Secondary | ICD-10-CM | POA: Insufficient documentation

## 2014-05-14 ENCOUNTER — Encounter (HOSPITAL_COMMUNITY)
Admission: RE | Admit: 2014-05-14 | Discharge: 2014-05-14 | Disposition: A | Discharge status: Home / Self Care | Payer: Self-pay | Source: Ambulatory Visit | Attending: Cardiovascular Disease | Admitting: Cardiovascular Disease

## 2014-05-16 ENCOUNTER — Encounter (HOSPITAL_COMMUNITY)
Admission: RE | Admit: 2014-05-16 | Discharge: 2014-05-16 | Disposition: A | Discharge status: Home / Self Care | Payer: Self-pay | Source: Ambulatory Visit | Attending: Cardiovascular Disease | Admitting: Cardiovascular Disease

## 2014-05-21 ENCOUNTER — Encounter (HOSPITAL_COMMUNITY)
Admission: RE | Admit: 2014-05-21 | Discharge: 2014-05-21 | Disposition: A | Discharge status: Home / Self Care | Payer: Self-pay | Source: Ambulatory Visit | Attending: Cardiovascular Disease | Admitting: Cardiovascular Disease

## 2014-05-23 ENCOUNTER — Encounter (HOSPITAL_COMMUNITY): Payer: Self-pay

## 2014-05-26 ENCOUNTER — Encounter (HOSPITAL_COMMUNITY)
Admission: RE | Admit: 2014-05-26 | Discharge: 2014-05-26 | Disposition: A | Discharge status: Home / Self Care | Payer: Self-pay | Source: Ambulatory Visit | Attending: Cardiovascular Disease | Admitting: Cardiovascular Disease

## 2014-05-28 ENCOUNTER — Encounter (HOSPITAL_COMMUNITY)
Admission: RE | Admit: 2014-05-28 | Discharge: 2014-05-28 | Disposition: A | Discharge status: Home / Self Care | Payer: Self-pay | Source: Ambulatory Visit | Attending: Cardiovascular Disease | Admitting: Cardiovascular Disease

## 2014-05-30 ENCOUNTER — Encounter (HOSPITAL_COMMUNITY)
Admission: RE | Admit: 2014-05-30 | Discharge: 2014-05-30 | Disposition: A | Discharge status: Home / Self Care | Payer: Self-pay | Source: Ambulatory Visit | Attending: Cardiovascular Disease | Admitting: Cardiovascular Disease

## 2014-06-02 ENCOUNTER — Encounter (HOSPITAL_COMMUNITY)
Admission: RE | Admit: 2014-06-02 | Discharge: 2014-06-02 | Disposition: A | Discharge status: Home / Self Care | Payer: Self-pay | Source: Ambulatory Visit | Attending: Cardiovascular Disease | Admitting: Cardiovascular Disease

## 2014-06-02 DIAGNOSIS — Z952 Presence of prosthetic heart valve: Secondary | ICD-10-CM | POA: Insufficient documentation

## 2014-06-02 DIAGNOSIS — Z5189 Encounter for other specified aftercare: Secondary | ICD-10-CM | POA: Insufficient documentation

## 2014-06-04 ENCOUNTER — Encounter (HOSPITAL_COMMUNITY)
Admission: RE | Admit: 2014-06-04 | Discharge: 2014-06-04 | Disposition: A | Discharge status: Home / Self Care | Payer: Self-pay | Source: Ambulatory Visit | Attending: Cardiovascular Disease | Admitting: Cardiovascular Disease

## 2014-06-06 ENCOUNTER — Encounter (HOSPITAL_COMMUNITY)
Admission: RE | Admit: 2014-06-06 | Discharge: 2014-06-06 | Disposition: A | Discharge status: Home / Self Care | Payer: Self-pay | Source: Ambulatory Visit | Attending: Cardiovascular Disease | Admitting: Cardiovascular Disease

## 2014-06-09 ENCOUNTER — Encounter (HOSPITAL_COMMUNITY)
Admission: RE | Admit: 2014-06-09 | Discharge: 2014-06-09 | Disposition: A | Discharge status: Home / Self Care | Payer: Self-pay | Source: Ambulatory Visit | Attending: Cardiovascular Disease | Admitting: Cardiovascular Disease

## 2014-06-11 ENCOUNTER — Encounter (HOSPITAL_COMMUNITY)
Admission: RE | Admit: 2014-06-11 | Discharge: 2014-06-11 | Disposition: A | Discharge status: Home / Self Care | Payer: Self-pay | Source: Ambulatory Visit | Attending: Cardiovascular Disease | Admitting: Cardiovascular Disease

## 2014-06-13 ENCOUNTER — Encounter (HOSPITAL_COMMUNITY)
Admission: RE | Admit: 2014-06-13 | Discharge: 2014-06-13 | Disposition: A | Discharge status: Home / Self Care | Payer: Self-pay | Source: Ambulatory Visit | Attending: Cardiovascular Disease | Admitting: Cardiovascular Disease

## 2014-06-16 ENCOUNTER — Encounter (HOSPITAL_COMMUNITY)
Admission: RE | Admit: 2014-06-16 | Discharge: 2014-06-16 | Disposition: A | Discharge status: Home / Self Care | Payer: Self-pay | Source: Ambulatory Visit | Attending: Cardiovascular Disease | Admitting: Cardiovascular Disease

## 2014-06-18 ENCOUNTER — Encounter (HOSPITAL_COMMUNITY)
Admission: RE | Admit: 2014-06-18 | Discharge: 2014-06-18 | Disposition: A | Discharge status: Home / Self Care | Payer: Self-pay | Source: Ambulatory Visit | Attending: Cardiovascular Disease | Admitting: Cardiovascular Disease

## 2014-06-20 ENCOUNTER — Encounter (HOSPITAL_COMMUNITY)
Admission: RE | Admit: 2014-06-20 | Discharge: 2014-06-20 | Disposition: A | Discharge status: Home / Self Care | Payer: Self-pay | Source: Ambulatory Visit | Attending: Cardiovascular Disease | Admitting: Cardiovascular Disease

## 2014-06-23 ENCOUNTER — Encounter (HOSPITAL_COMMUNITY)
Admission: RE | Admit: 2014-06-23 | Discharge: 2014-06-23 | Disposition: A | Discharge status: Home / Self Care | Payer: Self-pay | Source: Ambulatory Visit | Attending: Cardiovascular Disease | Admitting: Cardiovascular Disease

## 2014-06-25 ENCOUNTER — Encounter (HOSPITAL_COMMUNITY)
Admission: RE | Admit: 2014-06-25 | Discharge: 2014-06-25 | Disposition: A | Discharge status: Home / Self Care | Payer: Self-pay | Source: Ambulatory Visit | Attending: Cardiovascular Disease | Admitting: Cardiovascular Disease

## 2014-06-27 ENCOUNTER — Encounter (HOSPITAL_COMMUNITY)
Admission: RE | Admit: 2014-06-27 | Discharge: 2014-06-27 | Disposition: A | Discharge status: Home / Self Care | Payer: Self-pay | Source: Ambulatory Visit | Attending: Cardiovascular Disease | Admitting: Cardiovascular Disease

## 2014-06-30 ENCOUNTER — Encounter (HOSPITAL_COMMUNITY)
Admission: RE | Admit: 2014-06-30 | Discharge: 2014-06-30 | Disposition: A | Discharge status: Home / Self Care | Payer: Self-pay | Source: Ambulatory Visit | Attending: Cardiovascular Disease | Admitting: Cardiovascular Disease

## 2014-07-02 ENCOUNTER — Encounter (HOSPITAL_COMMUNITY): Payer: Self-pay

## 2014-07-04 ENCOUNTER — Encounter (HOSPITAL_COMMUNITY): Payer: Self-pay

## 2014-07-07 ENCOUNTER — Encounter (HOSPITAL_COMMUNITY): Payer: Self-pay

## 2014-07-09 ENCOUNTER — Encounter (HOSPITAL_COMMUNITY): Payer: Self-pay

## 2014-07-11 ENCOUNTER — Encounter (HOSPITAL_COMMUNITY): Payer: Self-pay

## 2014-07-14 ENCOUNTER — Encounter (HOSPITAL_COMMUNITY): Payer: Self-pay

## 2014-07-16 ENCOUNTER — Encounter (HOSPITAL_COMMUNITY): Payer: Self-pay

## 2014-07-18 ENCOUNTER — Encounter (HOSPITAL_COMMUNITY): Payer: Self-pay

## 2014-07-21 ENCOUNTER — Encounter (HOSPITAL_COMMUNITY): Payer: Self-pay

## 2014-07-23 ENCOUNTER — Encounter (HOSPITAL_COMMUNITY): Payer: Self-pay

## 2014-07-25 ENCOUNTER — Encounter (HOSPITAL_COMMUNITY): Payer: Self-pay

## 2014-07-28 ENCOUNTER — Encounter (HOSPITAL_COMMUNITY): Payer: Self-pay

## 2014-07-30 ENCOUNTER — Encounter (HOSPITAL_COMMUNITY): Payer: Self-pay

## 2014-08-01 ENCOUNTER — Encounter (HOSPITAL_COMMUNITY): Payer: Self-pay

## 2014-08-04 ENCOUNTER — Encounter (HOSPITAL_COMMUNITY): Payer: Self-pay

## 2014-08-06 ENCOUNTER — Encounter (HOSPITAL_COMMUNITY): Payer: Self-pay

## 2014-08-08 ENCOUNTER — Encounter (HOSPITAL_COMMUNITY): Payer: Self-pay

## 2014-08-11 ENCOUNTER — Encounter (HOSPITAL_COMMUNITY): Payer: Self-pay

## 2014-08-13 ENCOUNTER — Encounter (HOSPITAL_COMMUNITY): Payer: Self-pay

## 2014-08-15 ENCOUNTER — Encounter (HOSPITAL_COMMUNITY): Payer: Self-pay

## 2014-08-18 ENCOUNTER — Encounter (HOSPITAL_COMMUNITY): Payer: Self-pay

## 2014-08-20 ENCOUNTER — Encounter (HOSPITAL_COMMUNITY): Payer: Self-pay

## 2014-08-22 ENCOUNTER — Encounter (HOSPITAL_COMMUNITY): Payer: Self-pay

## 2014-08-25 ENCOUNTER — Encounter (HOSPITAL_COMMUNITY): Payer: Self-pay

## 2014-08-27 ENCOUNTER — Encounter (HOSPITAL_COMMUNITY): Payer: Self-pay

## 2014-08-29 ENCOUNTER — Encounter (HOSPITAL_COMMUNITY): Payer: Self-pay

## 2014-09-01 ENCOUNTER — Encounter (HOSPITAL_COMMUNITY): Payer: Self-pay

## 2014-09-03 ENCOUNTER — Encounter (HOSPITAL_COMMUNITY): Payer: Self-pay

## 2014-09-05 ENCOUNTER — Encounter (HOSPITAL_COMMUNITY): Payer: Self-pay

## 2014-09-08 ENCOUNTER — Encounter (HOSPITAL_COMMUNITY): Payer: Self-pay

## 2014-09-10 ENCOUNTER — Encounter (HOSPITAL_COMMUNITY): Payer: Self-pay

## 2014-09-12 ENCOUNTER — Encounter (HOSPITAL_COMMUNITY): Payer: Self-pay

## 2014-09-15 ENCOUNTER — Encounter (HOSPITAL_COMMUNITY): Payer: Self-pay

## 2014-09-17 ENCOUNTER — Encounter (HOSPITAL_COMMUNITY): Payer: Self-pay

## 2014-09-19 ENCOUNTER — Encounter (HOSPITAL_COMMUNITY): Payer: Self-pay

## 2014-09-22 ENCOUNTER — Encounter (HOSPITAL_COMMUNITY): Payer: Self-pay

## 2014-09-24 ENCOUNTER — Encounter (HOSPITAL_COMMUNITY): Payer: Self-pay

## 2014-09-26 ENCOUNTER — Encounter (HOSPITAL_COMMUNITY): Payer: Self-pay

## 2014-10-01 ENCOUNTER — Encounter (HOSPITAL_COMMUNITY): Payer: Self-pay

## 2014-10-03 ENCOUNTER — Encounter (HOSPITAL_COMMUNITY): Payer: Self-pay

## 2014-10-06 ENCOUNTER — Encounter (HOSPITAL_COMMUNITY): Payer: Self-pay

## 2014-10-08 ENCOUNTER — Encounter (HOSPITAL_COMMUNITY): Payer: Self-pay

## 2014-10-10 ENCOUNTER — Encounter (HOSPITAL_COMMUNITY): Payer: Self-pay

## 2014-10-13 ENCOUNTER — Encounter (HOSPITAL_COMMUNITY): Payer: Self-pay

## 2014-10-15 ENCOUNTER — Encounter (HOSPITAL_COMMUNITY): Payer: Self-pay

## 2014-10-17 ENCOUNTER — Encounter (HOSPITAL_COMMUNITY): Payer: Self-pay

## 2014-10-20 ENCOUNTER — Encounter (HOSPITAL_COMMUNITY): Payer: Self-pay

## 2014-10-22 ENCOUNTER — Encounter (HOSPITAL_COMMUNITY): Payer: Self-pay

## 2014-10-24 ENCOUNTER — Encounter (HOSPITAL_COMMUNITY): Payer: Self-pay

## 2014-10-27 ENCOUNTER — Encounter (HOSPITAL_COMMUNITY): Payer: Self-pay

## 2014-10-29 ENCOUNTER — Encounter (HOSPITAL_COMMUNITY): Payer: Self-pay

## 2014-10-31 ENCOUNTER — Encounter (HOSPITAL_COMMUNITY): Payer: Self-pay

## 2014-11-05 ENCOUNTER — Encounter (HOSPITAL_COMMUNITY): Payer: Self-pay

## 2014-11-07 ENCOUNTER — Encounter (HOSPITAL_COMMUNITY): Payer: Self-pay

## 2014-11-10 ENCOUNTER — Encounter (HOSPITAL_COMMUNITY): Payer: Self-pay

## 2014-11-12 ENCOUNTER — Encounter (HOSPITAL_COMMUNITY): Payer: Self-pay

## 2014-11-14 ENCOUNTER — Encounter (HOSPITAL_COMMUNITY): Payer: Self-pay

## 2014-11-17 ENCOUNTER — Encounter (HOSPITAL_COMMUNITY): Payer: Self-pay

## 2014-11-19 ENCOUNTER — Encounter (HOSPITAL_COMMUNITY): Payer: Self-pay

## 2014-11-21 ENCOUNTER — Encounter (HOSPITAL_COMMUNITY): Payer: Self-pay

## 2014-11-24 ENCOUNTER — Encounter (HOSPITAL_COMMUNITY): Payer: Self-pay

## 2014-11-26 ENCOUNTER — Encounter (HOSPITAL_COMMUNITY): Payer: Self-pay

## 2014-11-28 ENCOUNTER — Encounter (HOSPITAL_COMMUNITY): Payer: Self-pay

## 2015-02-20 ENCOUNTER — Other Ambulatory Visit: Payer: Self-pay

## 2015-02-20 ENCOUNTER — Ambulatory Visit (HOSPITAL_COMMUNITY): Payer: Medicare Other | Attending: Cardiovascular Disease

## 2015-02-20 ENCOUNTER — Other Ambulatory Visit: Payer: Self-pay | Admitting: Cardiovascular Disease

## 2015-02-20 DIAGNOSIS — Z8249 Family history of ischemic heart disease and other diseases of the circulatory system: Secondary | ICD-10-CM | POA: Diagnosis not present

## 2015-02-20 DIAGNOSIS — I359 Nonrheumatic aortic valve disorder, unspecified: Secondary | ICD-10-CM

## 2015-02-20 DIAGNOSIS — I059 Rheumatic mitral valve disease, unspecified: Secondary | ICD-10-CM | POA: Insufficient documentation

## 2015-02-20 DIAGNOSIS — E785 Hyperlipidemia, unspecified: Secondary | ICD-10-CM | POA: Diagnosis not present

## 2015-02-20 DIAGNOSIS — Z952 Presence of prosthetic heart valve: Secondary | ICD-10-CM | POA: Insufficient documentation

## 2015-02-20 DIAGNOSIS — Z87891 Personal history of nicotine dependence: Secondary | ICD-10-CM | POA: Insufficient documentation

## 2015-02-20 DIAGNOSIS — I517 Cardiomegaly: Secondary | ICD-10-CM | POA: Insufficient documentation

## 2015-02-20 DIAGNOSIS — I10 Essential (primary) hypertension: Secondary | ICD-10-CM | POA: Insufficient documentation

## 2015-03-13 ENCOUNTER — Encounter: Payer: Self-pay | Admitting: Cardiovascular Disease

## 2015-03-16 ENCOUNTER — Ambulatory Visit (INDEPENDENT_AMBULATORY_CARE_PROVIDER_SITE_OTHER): Payer: Medicare Other | Admitting: Cardiovascular Disease

## 2015-03-16 VITALS — BP 144/88 | HR 65 | Ht 69.5 in | Wt 191.2 lb

## 2015-03-16 DIAGNOSIS — Z952 Presence of prosthetic heart valve: Secondary | ICD-10-CM

## 2015-03-16 DIAGNOSIS — I10 Essential (primary) hypertension: Secondary | ICD-10-CM

## 2015-03-16 DIAGNOSIS — E785 Hyperlipidemia, unspecified: Secondary | ICD-10-CM

## 2015-03-16 DIAGNOSIS — Z954 Presence of other heart-valve replacement: Secondary | ICD-10-CM

## 2015-03-16 MED ORDER — LISINOPRIL 5 MG PO TABS: 7.5000 mg | ORAL_TABLET | Freq: Every day | ORAL | Status: DC

## 2015-03-16 NOTE — Patient Instructions (Signed)
Dr Tresa EndoKelly has recommended making the following medication changes: INCREASE Lisinopril to 7.5 mg - take 1.5 tablets by mouth daily.  >>A new prescription has been sent to your pharmacy with updated instructions.  Dr Tresa EndoKelly recommends that you schedule a follow-up appointment in 6 months. You will receive a reminder letter in the mail two months in advance. If you don't receive a letter, please call our office to schedule the follow-up appointment.  If you need a refill on your cardiac medications before your next appointment, please call your pharmacy.

## 2015-03-17 ENCOUNTER — Encounter: Payer: Self-pay | Admitting: Cardiovascular Disease
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
## 2015-03-18 ENCOUNTER — Encounter: Payer: Self-pay | Admitting: *Deleted

## 2015-05-06 ENCOUNTER — Encounter (HOSPITAL_COMMUNITY)
Admission: RE | Admit: 2015-05-06 | Discharge: 2015-05-06 | Disposition: A | Discharge status: Home / Self Care | Payer: Self-pay | Source: Ambulatory Visit | Attending: Cardiovascular Disease | Admitting: Cardiovascular Disease

## 2015-05-06 DIAGNOSIS — I38 Endocarditis, valve unspecified: Secondary | ICD-10-CM | POA: Insufficient documentation

## 2015-05-08 ENCOUNTER — Encounter (HOSPITAL_COMMUNITY)
Admission: RE | Admit: 2015-05-08 | Discharge: 2015-05-08 | Disposition: A | Discharge status: Home / Self Care | Payer: Self-pay | Source: Ambulatory Visit | Attending: Cardiovascular Disease | Admitting: Cardiovascular Disease

## 2015-05-11 ENCOUNTER — Encounter (HOSPITAL_COMMUNITY)
Admission: RE | Admit: 2015-05-11 | Discharge: 2015-05-11 | Disposition: A | Discharge status: Home / Self Care | Payer: Self-pay | Source: Ambulatory Visit | Attending: Cardiovascular Disease | Admitting: Cardiovascular Disease

## 2015-05-13 ENCOUNTER — Encounter (HOSPITAL_COMMUNITY)
Admission: RE | Admit: 2015-05-13 | Discharge: 2015-05-13 | Disposition: A | Discharge status: Home / Self Care | Payer: Self-pay | Source: Ambulatory Visit | Attending: Cardiovascular Disease | Admitting: Cardiovascular Disease

## 2015-05-15 ENCOUNTER — Encounter (HOSPITAL_COMMUNITY)
Admission: RE | Admit: 2015-05-15 | Discharge: 2015-05-15 | Disposition: A | Discharge status: Home / Self Care | Payer: Self-pay | Source: Ambulatory Visit | Attending: Cardiovascular Disease | Admitting: Cardiovascular Disease

## 2015-05-18 ENCOUNTER — Encounter (HOSPITAL_COMMUNITY)
Admission: RE | Admit: 2015-05-18 | Discharge: 2015-05-18 | Disposition: A | Discharge status: Home / Self Care | Payer: Self-pay | Source: Ambulatory Visit | Attending: Cardiovascular Disease | Admitting: Cardiovascular Disease

## 2015-05-20 ENCOUNTER — Encounter (HOSPITAL_COMMUNITY)
Admission: RE | Admit: 2015-05-20 | Discharge: 2015-05-20 | Disposition: A | Discharge status: Home / Self Care | Payer: Self-pay | Source: Ambulatory Visit | Attending: Cardiovascular Disease | Admitting: Cardiovascular Disease

## 2015-05-22 ENCOUNTER — Encounter (HOSPITAL_COMMUNITY)
Admission: RE | Admit: 2015-05-22 | Discharge: 2015-05-22 | Disposition: A | Discharge status: Home / Self Care | Payer: Self-pay | Source: Ambulatory Visit | Attending: Cardiovascular Disease | Admitting: Cardiovascular Disease

## 2015-05-22 NOTE — Progress Notes (Signed)
Patient's blood pressure at rest today was 158/90, then increased to 188/96 on his first exercise station.Pt states that he took his morning medications late yesterday therefore he only took 5 mg of Lisinopril this morning instead of his prescribed dose of 7.5mg  because he thought "it would still be in his system". Pt also took NyQuil and Advil PM last night. Rechecked BP after walking at a slower pace was 162/86. Pt was asymptomatic and exercised at reduced workloads without issue. Exit BP was 146/86.

## 2015-05-25 ENCOUNTER — Encounter (HOSPITAL_COMMUNITY)
Admission: RE | Admit: 2015-05-25 | Discharge: 2015-05-25 | Disposition: A | Discharge status: Home / Self Care | Payer: Self-pay | Source: Ambulatory Visit | Attending: Cardiovascular Disease | Admitting: Cardiovascular Disease

## 2015-05-27 ENCOUNTER — Encounter (HOSPITAL_COMMUNITY)
Admission: RE | Admit: 2015-05-27 | Discharge: 2015-05-27 | Disposition: A | Discharge status: Home / Self Care | Payer: Self-pay | Source: Ambulatory Visit | Attending: Cardiovascular Disease | Admitting: Cardiovascular Disease

## 2015-05-29 ENCOUNTER — Encounter (HOSPITAL_COMMUNITY)
Admission: RE | Admit: 2015-05-29 | Discharge: 2015-05-29 | Disposition: A | Discharge status: Home / Self Care | Payer: Self-pay | Source: Ambulatory Visit | Attending: Cardiovascular Disease | Admitting: Cardiovascular Disease

## 2015-06-01 ENCOUNTER — Encounter (HOSPITAL_COMMUNITY)
Admission: RE | Admit: 2015-06-01 | Discharge: 2015-06-01 | Disposition: A | Discharge status: Home / Self Care | Payer: Self-pay | Source: Ambulatory Visit | Attending: Cardiovascular Disease | Admitting: Cardiovascular Disease

## 2015-06-03 ENCOUNTER — Encounter (HOSPITAL_COMMUNITY)
Admission: RE | Admit: 2015-06-03 | Discharge: 2015-06-03 | Disposition: A | Discharge status: Home / Self Care | Payer: Self-pay | Source: Ambulatory Visit | Attending: Cardiovascular Disease | Admitting: Cardiovascular Disease

## 2015-06-03 DIAGNOSIS — I38 Endocarditis, valve unspecified: Secondary | ICD-10-CM | POA: Insufficient documentation

## 2015-06-05 ENCOUNTER — Encounter (HOSPITAL_COMMUNITY)
Admission: RE | Admit: 2015-06-05 | Discharge: 2015-06-05 | Disposition: A | Discharge status: Home / Self Care | Payer: Self-pay | Source: Ambulatory Visit | Attending: Cardiovascular Disease | Admitting: Cardiovascular Disease

## 2015-06-08 ENCOUNTER — Encounter (HOSPITAL_COMMUNITY)
Admission: RE | Admit: 2015-06-08 | Discharge: 2015-06-08 | Disposition: A | Discharge status: Home / Self Care | Payer: Self-pay | Source: Ambulatory Visit | Attending: Cardiovascular Disease | Admitting: Cardiovascular Disease

## 2015-06-10 ENCOUNTER — Encounter (HOSPITAL_COMMUNITY)
Admission: RE | Admit: 2015-06-10 | Discharge: 2015-06-10 | Disposition: A | Discharge status: Home / Self Care | Payer: Self-pay | Source: Ambulatory Visit | Attending: Cardiovascular Disease | Admitting: Cardiovascular Disease

## 2015-06-12 ENCOUNTER — Encounter (HOSPITAL_COMMUNITY)
Admission: RE | Admit: 2015-06-12 | Discharge: 2015-06-12 | Disposition: A | Discharge status: Home / Self Care | Payer: Self-pay | Source: Ambulatory Visit | Attending: Cardiovascular Disease | Admitting: Cardiovascular Disease

## 2015-06-15 ENCOUNTER — Encounter (HOSPITAL_COMMUNITY)
Admission: RE | Admit: 2015-06-15 | Discharge: 2015-06-15 | Disposition: A | Discharge status: Home / Self Care | Payer: Self-pay | Source: Ambulatory Visit | Attending: Cardiovascular Disease | Admitting: Cardiovascular Disease

## 2015-06-17 ENCOUNTER — Encounter (HOSPITAL_COMMUNITY)
Admission: RE | Admit: 2015-06-17 | Discharge: 2015-06-17 | Disposition: A | Discharge status: Home / Self Care | Payer: Self-pay | Source: Ambulatory Visit | Attending: Cardiovascular Disease | Admitting: Cardiovascular Disease

## 2015-06-19 ENCOUNTER — Encounter (HOSPITAL_COMMUNITY)
Admission: RE | Admit: 2015-06-19 | Discharge: 2015-06-19 | Disposition: A | Discharge status: Home / Self Care | Payer: Self-pay | Source: Ambulatory Visit | Attending: Cardiovascular Disease | Admitting: Cardiovascular Disease

## 2015-06-22 ENCOUNTER — Encounter (HOSPITAL_COMMUNITY)
Admission: RE | Admit: 2015-06-22 | Discharge: 2015-06-22 | Disposition: A | Discharge status: Home / Self Care | Payer: Self-pay | Source: Ambulatory Visit | Attending: Cardiovascular Disease | Admitting: Cardiovascular Disease

## 2015-06-24 ENCOUNTER — Encounter (HOSPITAL_COMMUNITY)
Admission: RE | Admit: 2015-06-24 | Discharge: 2015-06-24 | Disposition: A | Discharge status: Home / Self Care | Payer: Self-pay | Source: Ambulatory Visit | Attending: Cardiovascular Disease | Admitting: Cardiovascular Disease

## 2015-06-26 ENCOUNTER — Encounter (HOSPITAL_COMMUNITY)
Admission: RE | Admit: 2015-06-26 | Discharge: 2015-06-26 | Disposition: A | Discharge status: Home / Self Care | Payer: Self-pay | Source: Ambulatory Visit | Attending: Cardiovascular Disease | Admitting: Cardiovascular Disease

## 2015-06-29 ENCOUNTER — Encounter (HOSPITAL_COMMUNITY)
Admission: RE | Admit: 2015-06-29 | Discharge: 2015-06-29 | Disposition: A | Discharge status: Home / Self Care | Payer: Self-pay | Source: Ambulatory Visit | Attending: Cardiovascular Disease | Admitting: Cardiovascular Disease

## 2015-07-01 ENCOUNTER — Encounter (HOSPITAL_COMMUNITY)
Admission: RE | Admit: 2015-07-01 | Discharge: 2015-07-01 | Disposition: A | Discharge status: Home / Self Care | Payer: Self-pay | Source: Ambulatory Visit | Attending: Cardiovascular Disease | Admitting: Cardiovascular Disease

## 2015-07-01 DIAGNOSIS — I38 Endocarditis, valve unspecified: Secondary | ICD-10-CM | POA: Insufficient documentation

## 2015-07-03 ENCOUNTER — Encounter (HOSPITAL_COMMUNITY)
Admission: RE | Admit: 2015-07-03 | Discharge: 2015-07-03 | Disposition: A | Discharge status: Home / Self Care | Payer: Self-pay | Source: Ambulatory Visit | Attending: Cardiovascular Disease | Admitting: Cardiovascular Disease

## 2015-07-06 ENCOUNTER — Encounter (HOSPITAL_COMMUNITY)
Admission: RE | Admit: 2015-07-06 | Discharge: 2015-07-06 | Disposition: A | Discharge status: Home / Self Care | Payer: Self-pay | Source: Ambulatory Visit | Attending: Cardiovascular Disease | Admitting: Cardiovascular Disease

## 2015-07-08 ENCOUNTER — Encounter (HOSPITAL_COMMUNITY)
Admission: RE | Admit: 2015-07-08 | Discharge: 2015-07-08 | Disposition: A | Discharge status: Home / Self Care | Payer: Self-pay | Source: Ambulatory Visit | Attending: Cardiovascular Disease | Admitting: Cardiovascular Disease

## 2015-07-10 ENCOUNTER — Encounter (HOSPITAL_COMMUNITY): Payer: Self-pay

## 2015-07-13 ENCOUNTER — Encounter (HOSPITAL_COMMUNITY)
Admission: RE | Admit: 2015-07-13 | Discharge: 2015-07-13 | Disposition: A | Discharge status: Home / Self Care | Payer: Self-pay | Source: Ambulatory Visit | Attending: Cardiovascular Disease | Admitting: Cardiovascular Disease

## 2015-07-15 ENCOUNTER — Encounter (HOSPITAL_COMMUNITY)
Admission: RE | Admit: 2015-07-15 | Discharge: 2015-07-15 | Disposition: A | Discharge status: Home / Self Care | Payer: Self-pay | Source: Ambulatory Visit | Attending: Cardiovascular Disease | Admitting: Cardiovascular Disease

## 2015-07-17 ENCOUNTER — Encounter (HOSPITAL_COMMUNITY)
Admission: RE | Admit: 2015-07-17 | Discharge: 2015-07-17 | Disposition: A | Discharge status: Home / Self Care | Payer: Self-pay | Source: Ambulatory Visit | Attending: Cardiovascular Disease | Admitting: Cardiovascular Disease

## 2015-07-20 ENCOUNTER — Encounter (HOSPITAL_COMMUNITY)
Admission: RE | Admit: 2015-07-20 | Discharge: 2015-07-20 | Disposition: A | Discharge status: Home / Self Care | Payer: Self-pay | Source: Ambulatory Visit | Attending: Cardiovascular Disease | Admitting: Cardiovascular Disease

## 2015-07-22 ENCOUNTER — Encounter (HOSPITAL_COMMUNITY)
Admission: RE | Admit: 2015-07-22 | Discharge: 2015-07-22 | Disposition: A | Discharge status: Home / Self Care | Payer: Self-pay | Source: Ambulatory Visit | Attending: Cardiovascular Disease | Admitting: Cardiovascular Disease

## 2015-07-24 ENCOUNTER — Encounter (HOSPITAL_COMMUNITY)
Admission: RE | Admit: 2015-07-24 | Discharge: 2015-07-24 | Disposition: A | Discharge status: Home / Self Care | Payer: Self-pay | Source: Ambulatory Visit | Attending: Cardiovascular Disease | Admitting: Cardiovascular Disease

## 2015-07-27 ENCOUNTER — Encounter (HOSPITAL_COMMUNITY)
Admission: RE | Admit: 2015-07-27 | Discharge: 2015-07-27 | Disposition: A | Discharge status: Home / Self Care | Payer: Self-pay | Source: Ambulatory Visit | Attending: Cardiovascular Disease | Admitting: Cardiovascular Disease

## 2015-07-29 ENCOUNTER — Encounter (HOSPITAL_COMMUNITY)
Admission: RE | Admit: 2015-07-29 | Discharge: 2015-07-29 | Disposition: A | Discharge status: Home / Self Care | Payer: Self-pay | Source: Ambulatory Visit | Attending: Cardiovascular Disease | Admitting: Cardiovascular Disease

## 2015-07-31 ENCOUNTER — Encounter (HOSPITAL_COMMUNITY)
Admission: RE | Admit: 2015-07-31 | Discharge: 2015-07-31 | Disposition: A | Discharge status: Home / Self Care | Payer: Self-pay | Source: Ambulatory Visit | Attending: Cardiovascular Disease | Admitting: Cardiovascular Disease

## 2015-10-27 IMAGING — CR DG CHEST 2V
2 series · 2 of 2 positions shown · non-contrast
Comparison: Portable chest x-ray May 14, 2013.

CLINICAL DATA: Aortic valve replacement in May 2013, currently
asymptomatic

EXAM:
CHEST  2 VIEW

[w chest pa]
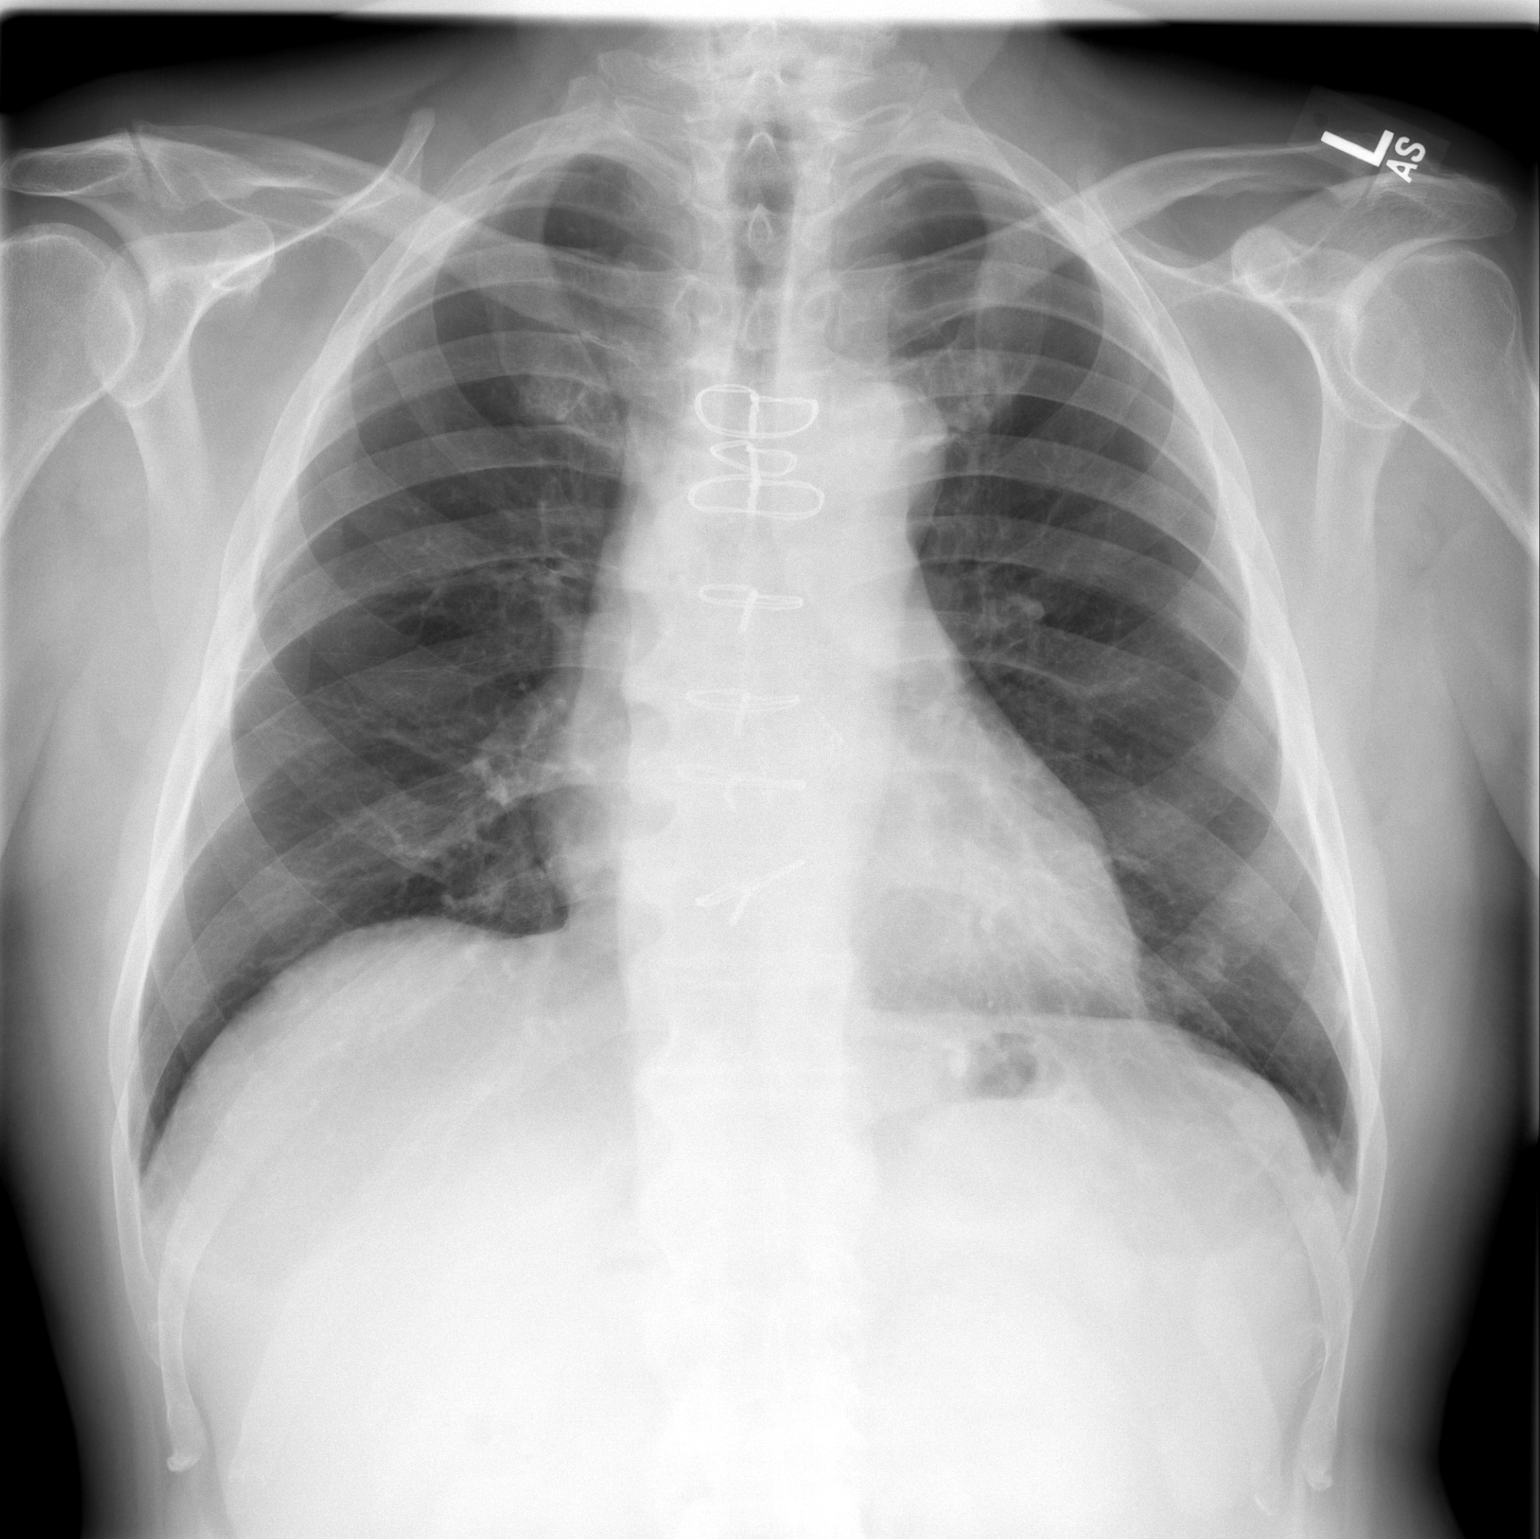

[w chest lat]
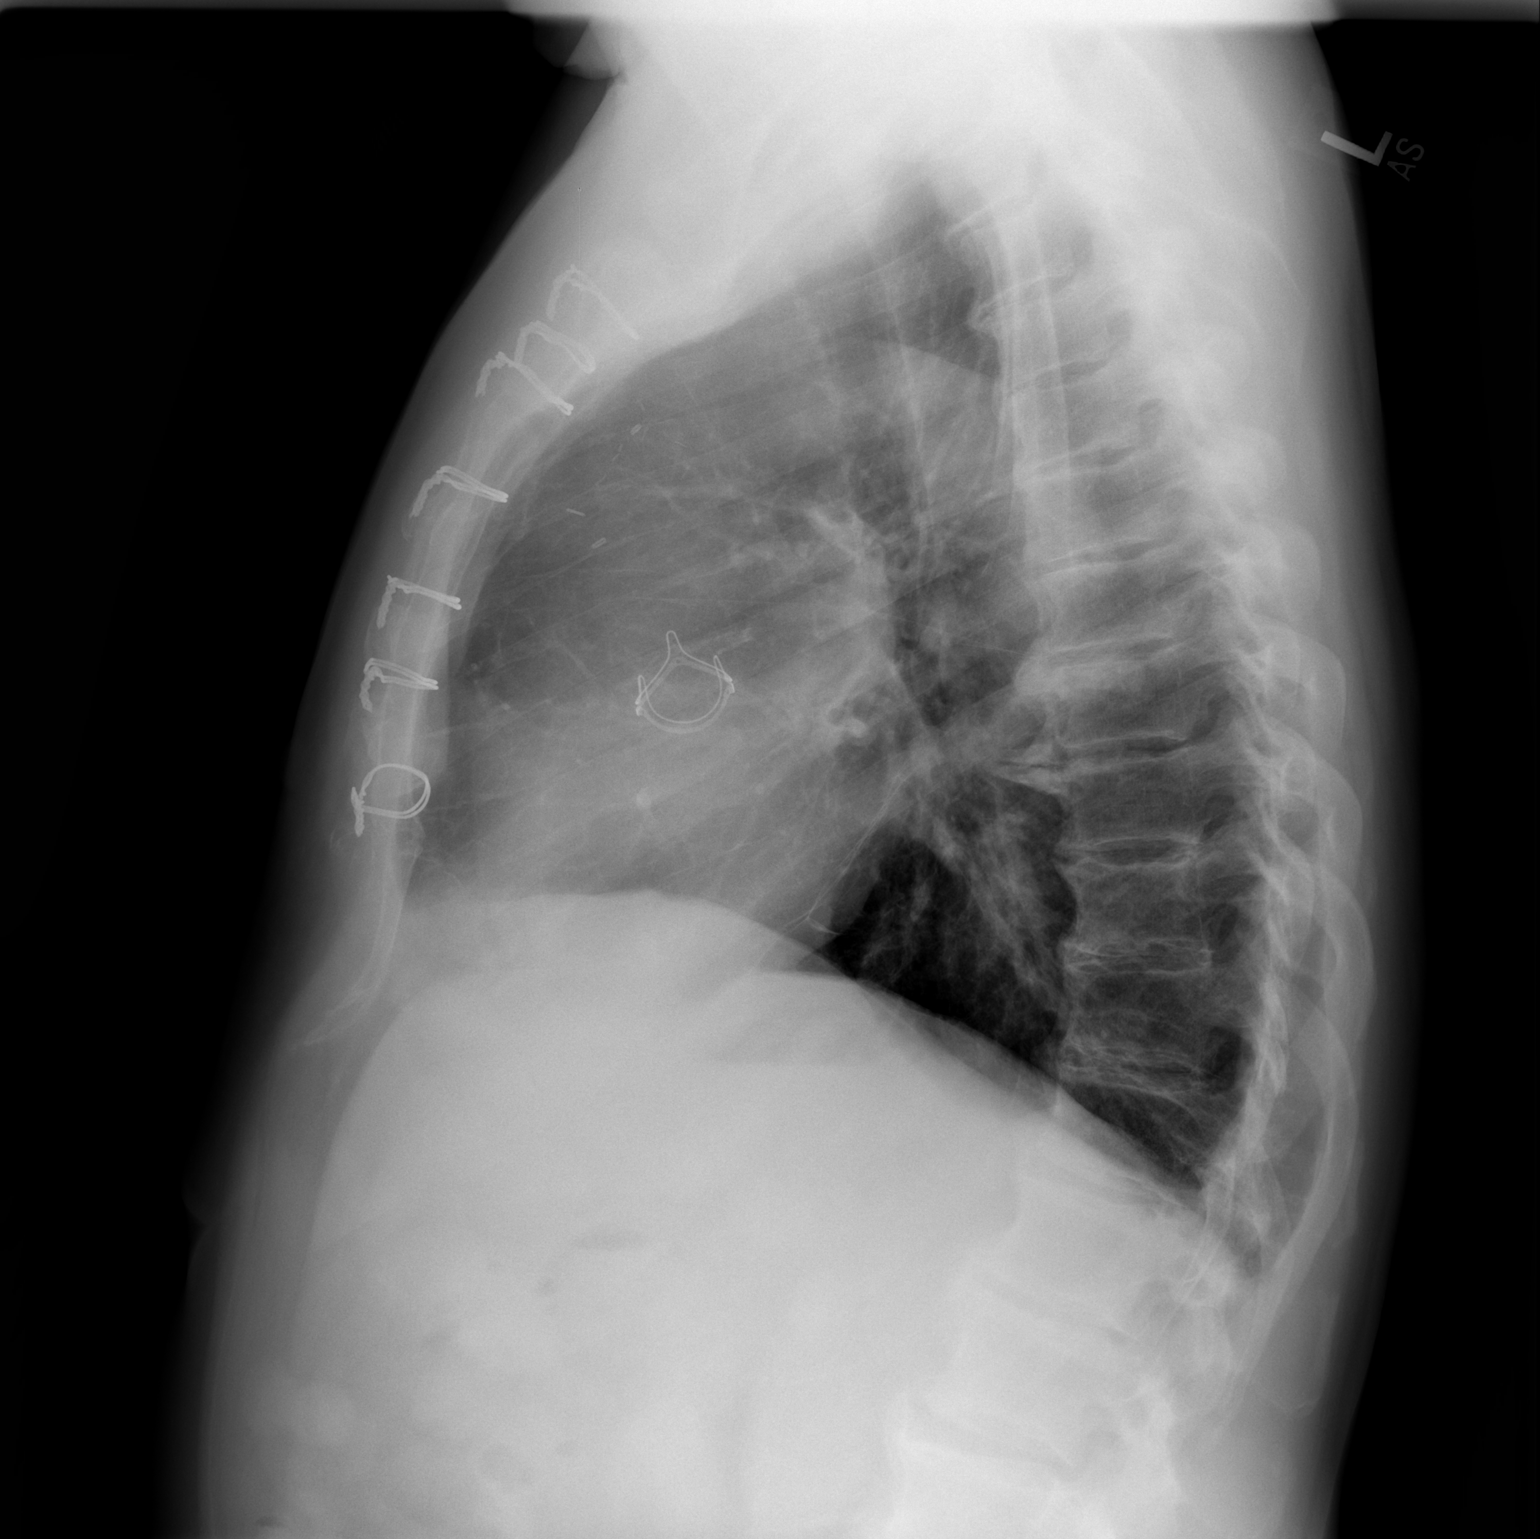

[2 of 2 positions shown; findings below may reference images not displayed]

FINDINGS: The lungs are well-expanded and clear. There is no pleural effusion
or pneumothorax. The cardiopericardial silhouette is normal in size
and contour. The prosthetic aortic valve appears be in appropriate
position. There is no pulmonary vascular congestion. There is mild
tortuosity of the descending thoracic aorta. There are 7 intact
sternal wires demonstrated. The bony structures exhibit no acute
abnormalities.
IMPRESSION: No active cardiopulmonary disease.

## 2015-11-13 ENCOUNTER — Encounter: Payer: Self-pay | Admitting: Cardiovascular Disease

## 2015-11-13 ENCOUNTER — Ambulatory Visit (INDEPENDENT_AMBULATORY_CARE_PROVIDER_SITE_OTHER): Payer: Medicare Other | Admitting: Cardiovascular Disease

## 2015-11-13 VITALS — BP 140/89 | HR 53 | Ht 69.5 in | Wt 180.2 lb

## 2015-11-13 DIAGNOSIS — E785 Hyperlipidemia, unspecified: Secondary | ICD-10-CM | POA: Diagnosis not present

## 2015-11-13 DIAGNOSIS — Z954 Presence of other heart-valve replacement: Secondary | ICD-10-CM

## 2015-11-13 DIAGNOSIS — I10 Essential (primary) hypertension: Secondary | ICD-10-CM | POA: Diagnosis not present

## 2015-11-13 DIAGNOSIS — I350 Nonrheumatic aortic (valve) stenosis: Secondary | ICD-10-CM

## 2015-11-13 DIAGNOSIS — K219 Gastro-esophageal reflux disease without esophagitis: Secondary | ICD-10-CM

## 2015-11-13 DIAGNOSIS — Z952 Presence of prosthetic heart valve: Secondary | ICD-10-CM

## 2015-11-13 NOTE — Patient Instructions (Signed)
Your physician has requested that you have an echocardiogram. Echocardiography is a painless test that uses sound waves to create images of your heart. It provides your doctor with information about the size and shape of your heart and how well your heart's chambers and valves are working. This procedure takes approximately one hour. There are no restrictions for this procedure.  THIS WILL BE DONE April 2018.  Your physician recommends that you schedule a follow-up appointment [facility].

## 2015-11-15 ENCOUNTER — Encounter: Payer: Self-pay | Admitting: Cardiovascular Disease

## 2015-11-15 DIAGNOSIS — K219 Gastro-esophageal reflux disease without esophagitis: Secondary | ICD-10-CM | POA: Insufficient documentation

## 2015-11-15 NOTE — Progress Notes (Signed)
Patient ID: Drew Andrews, male   DOB: 08-Jan-1946, 70 y.o.   MRN: [id number]     HPI: Drew Andrews is a 70 y.o. male who presents for a 9 month follow-up cardiology evaluation following his aortic valve replacement for severe aortic valve stenosis.  Drew Andrews was first told of having a heart murmur back in the 1980s when he was planning to work at  [employer] and had a physical exam done at that time. I have  seen him since 2005 when he was referred to me by [name] for cardiac murmur. Since 2005 he had undergone follow-up  echo Doppler studies to assess his aortic valve. In 2005 his aortic transvalvular gradient was 37 with a mean gradient of 18. Over the last 9 years, his aortic valve murmur gradually become more significant and he remained asymptomatic.  An echo in 11/03/2014showed normal LV function with  grade 2 diastolic dysfunction. His aortic valve again was moderately calcified. The mean gradient had increased to 47 and peak instantaneous gradient 86 giving a calculated aortic valve area of 0.63 cm. He did have mitral annular calcification. His left atrium was moderately dilated. He did have very mild RV dilatation.  When I saw him in November 2014 Drew Andrews was continuing to exercise but there was new development of exertional shortness of breath. At that time, I strongly recommended cardiac catheterization and he underwent right and left heart cardiac catheterization on 03/15/2013. This confirmed severe aortic valve stenosis mild pulmonary hypertension. He had mild chronic calcification but nonobstructive 20% narrowing in the LAD.   He underwent successful aortic valve replacement surgery by Dr. [name] 05/13/2013 and had a 25 mm [facility] Ease bovine pericardial valve inserted. Socially, he has done remarkably well following his valve replacement surgery. He denies any episodes of palpitations. He has resumed activity.  Since his valve  replacement, he has continues to feel well.  He denies any shortness of breath or chest pain.  He does exercise regularly.  Typically in the cold winter months.  He likes to exercise at cardiac rehabilitation for the months of January and February..  However, he has had several rare occurrences of diplopia it are short lived.  He has been taking aspirin 81 mg daily.  A repeat echo Doppler study on 12/23/2013.revealed an EF 55-60% with mild LVH.  His aortic prosthesis was well-seated and open well.  There was only trivial aortic insufficiency.  He did have mild dilatation of his left atrium and mitral annular calcification.  There also was some dilatation of his RV.    His last echo in October 2016 continued to show excellent LV function with an EF of 55-60% and he had normal diastolic parameters.  The bioprosthetic AV was  functioning normally.  The gradients were felt to be very minimally elevated and unchanged from his previous study with a mean gradient of 11 and peak gradient of 23 mm.  There was no evidence for aortic insufficiency.  There was evidence for mitral annular calcification and mild LA dilatation.  Since I last saw him, he continues to do well.  He is riding his bike several days per week and exercises daily including fast walking.  The cardiac rehabilitation program and typically likes to do this in the winter months when he could not bike outside.  He denies any chest pain, presyncope or syncope, palpitations, or change in exercise tolerance.  He presents for reevaluation.  Past Medical History  Diagnosis  Date  . Seasonal allergies   . GERD (gastroesophageal reflux disease)   . LVH (left ventricular hypertrophy)     with aortic stenosis-bicuspid  . Hyperlipidemia   . Hypertension   . PONV (postoperative nausea and vomiting)     as a child  . S/P AVR (aortic valve replacement) 05/13/2013    Past Surgical History  Procedure Laterality Date  . Appendectomy  age 21  .  Tonsillectomy    . Hiatal hernia repair    . Colonoscopy      X 2  . Cardiac catheterization    . Aortic valve replacement N/A 05/13/2013    Procedure: AORTIC VALVE REPLACEMENT (AVR);  Surgeon: [name], MD;  Location: [place];  Service: Open Heart Surgery;  Laterality: N/A;  . Intraoperative transesophageal echocardiogram N/A 05/13/2013    Procedure: INTRAOPERATIVE TRANSESOPHAGEAL ECHOCARDIOGRAM;  Surgeon: [name], MD;  Location: [facility] OR;  Service: Open Heart Surgery;  Laterality: N/A;  . Transthoracic echocardiogram  03/04/2013    EF 75-64%, grade 2 diastolic dysfunction, AV with mod calcified leaflets & mild regurg, calcified MV annulus, LA mod dilated, RV mildly dilated  . Left heart catheterization with coronary angiogram N/A 03/15/2013    Procedure: LEFT HEART CATHETERIZATION WITH CORONARY ANGIOGRAM;  Surgeon: [name], MD;  Location: [facility] CATH LAB;  Service: Cardiovascular;  Laterality: N/A;    Allergies  Allergen Reactions  . Scopolamine     seizure    Current Outpatient Prescriptions  Medication Sig Dispense Refill  . amoxicillin (AMOXIL) 500 MG tablet Take 4 tablets 1 hour prior to dental procedure 4 tablet 11  . aspirin 81 MG tablet Take 81 mg by mouth daily.    . Ibuprofen-Diphenhydramine Cit (ADVIL PM PO) Take 2-3 tablets by mouth at bedtime as needed (for sleep).     [name] lisinopril (PRINIVIL,ZESTRIL) 5 MG tablet Take 1.5 tablets (7.5 mg total) by mouth at bedtime. 135 tablet 3  . meloxicam (MOBIC) 15 MG tablet Take 1 tablet by mouth as needed for pain.     . metoprolol succinate (TOPROL-XL) 50 MG 24 hr tablet Take 1 tablet (50 mg total) by mouth daily. Take with or immediately following a meal. 90 tablet 3  . omeprazole (PRILOSEC OTC) 20 MG tablet Take 20 mg by mouth daily.     [name] VYTORIN 10-40 MG per tablet Take 1 tablet by mouth at bedtime.      No current facility-administered medications for this visit.    Social History   Social History  . Marital  Status: Married    Spouse Name: N/A  . Number of Children: 1  . Years of Education: college   Occupational History  . Not on file.   Social History Main Topics  . Smoking status: Former Smoker -- 1.00 packs/day    Quit date: 02/10/1974  . Smokeless tobacco: Never Used  . Alcohol Use: Yes     Comment: Drinks 1 bottle of wine daily and 1 beer  . Drug Use: No  . Sexual Activity: Not on file   Other Topics Concern  . Not on file   Social History Narrative   Additional social history is notable in that he has a Paediatric nurse in IT sales professional. He is retired from [employer]. He quit tobacco in 1979. He continues to exercise 7 days per week.  Family History  Problem Relation Age of Onset  . Colon cancer Neg Hx   . Stomach cancer Neg Hx   .  Heart attack Father   . Acute myelogenous leukemia Brother   . Hypertension Mother   . Hyperlipidemia Mother   . Diabetes Mother   . Pneumonia Maternal Grandmother   . Heart attack Maternal Grandfather    ROS General: Negative; No fevers, chills, or night sweats;  HEENT: Negative; No changes in vision or hearing, sinus congestion, difficulty swallowing Pulmonary: Negative; No cough, wheezing, shortness of breath, hemoptysis Cardiovascular: Negative; No chest pain, presyncope, syncope, palpitations GI: Negative; No nausea, vomiting, diarrhea, or abdominal pain GU: Negative; No dysuria, hematuria, or difficulty voiding Musculoskeletal: Mild arthritic issues of his elbows and knees; no myalgias, joint pain, or weakness Hematologic/Oncology: Negative; no easy bruising, bleeding Endocrine: Negative; no heat/cold intolerance; no diabetes Neuro: Rare episodes of diplopia; no changes in balance, headaches Skin: Negative; No rashes or skin lesions Psychiatric: Negative; No behavioral problems, depression Sleep: Negative; No snoring, daytime sleepiness, hypersomnolence, bruxism, restless legs, hypnogognic hallucinations, no  cataplexy Other comprehensive 14 point system review is negative.   PE BP 140/89 mmHg  Pulse 53  Ht 5' 9.5" (1.765 m)  Wt 180 lb 3.2 oz (81.738 kg)  BMI 26.24 kg/m2  Repeat blood pressure was 130/78. There was no orthostatic change.  Wt Readings from Last 3 Encounters:  11/13/15 180 lb 3.2 oz (81.738 kg)  03/16/15 191 lb 3.2 oz (86.728 kg)  02/19/14 182 lb 11.2 oz (82.872 kg)   General: Alert, oriented, no distress.  Skin: normal turgor, no rashes HEENT: Normocephalic, atraumatic. Pupils round and reactive; sclera anicteric;no lid lag.  Nose without nasal septal hypertrophy Mouth/Parynx benign; Mallinpatti scale 2 Neck: No JVD; transmitted murmurs to his carotids bilaterally Lungs: clear to ausculatation and percussion; no wheezing or rales No chest wall tenderness to palpation Heart: RRR, s1 s2 norma 2/6 systolic murmur concordant with his aortic valve replacement. No AR murmur. No S3 or S4 gallop. No rubs thrills or heaves.  Abdomen: soft, nontender; no hepatosplenomehaly, BS+; abdominal aorta nontender and not dilated by palpation. Back: No CVA tenderness; minimal residual ecchymosis inferior to his left buttock Pulses 2+ Femoral pulses 2+ without bruits. Extremities: no clubbing cyanosis or edema, Homan's sign negative  Neurologic: grossly nonfocal; normal motor strength Psychologic: normal affect and mood.  ECG (independently read by me): Sinus bradycardia at 53 bpm.  Mild first-degree AV block with a PR interval at 226 ms.  Borderline LVH by voltage criteria.  Mild T wave abnormality in 3 and aVF.  ECG (independently read by me): Normal sinus rhythm at 65 bpm.  First-degree AV block with a PR interval at 244 ms.  October 2015 ECG disease independently read by me): Normal sinus rhythm at 57 beats per minute.  Mild first degree AV block.  Prior 07/08/2013 EKG (independently read by the) normal sinus rhythm. Non-specific ST-T changes inferolaterally.  Prior 03/12/13 ECG:  Sinus rhythm at 66 beats per minute. First-degree AV block. LVH by voltage criteria. Mild nondiagnostic T changes.  LABS:  BMP Latest Ref Rng 07/08/2013 05/15/2013 05/14/2013  Glucose 70 - 99 mg/dL 97 130(H) 123(H)  BUN 6 - 23 mg/dL _0 Creatinine 0.50 - 1.35 mg/dL 1.02 0.95 1.00  Sodium 135 - 145 mEq/L 139 137 139  Potassium 3.5 - 5.3 mEq/L 4.5 4.4 4.0  Chloride 96 - 112 mEq/L 108 102 103  CO2 19 - 32 mEq/L 22 22 -  Calcium 8.4 - 10.5 mg/dL 8.7 7.8(L) -   Hepatic Function Latest Ref Rng 07/08/2013 05/09/2013 03/12/2013  Total Protein 6.0 -  8.3 g/dL 6.5 7.4 7.0  Albumin 3.5 - 5.2 g/dL 4.1 4.2 4.6  AST 0 - 37 U/L _0 ALT 0 - 53 U/L 21 33 18  Alk Phosphatase 39 - 117 U/L 44 43 39  Total Bilirubin 0.2 - 1.2 mg/dL 0.4 0.5 0.7   CBC Latest Ref Rng 08/19/2013 07/08/2013 05/17/2013  WBC 4.0 - 10.5 K/uL 6.8 6.8 6.7  Hemoglobin 13.0 - 17.0 g/dL 13.4 12.3(L) 9.4(L)  Hematocrit 39.0 - 52.0 % 39.3 36.7(L) 27.3(L)  Platelets 150 - 400 K/uL 170 166 84(L)   Lab Results  Component Value Date   MCV 92.0 08/19/2013   MCV 95.1 07/08/2013   MCV 96.8 05/17/2013   Lab Results  Component Value Date   TSH 1.554 07/08/2013   Lab Results  Component Value Date   HGBA1C 5.5 05/09/2013   Lipid Panel     Component Value Date/Time   CHOL 122 07/08/2013 0856   TRIG 89 07/08/2013 0856   HDL 42 07/08/2013 0856   CHOLHDL 2.9 07/08/2013 0856   VLDL 18 07/08/2013 0856   LDLCALC 62 07/08/2013 0856     RADIOLOGY: No results found.    ASSESSMENT AND PLAN: Drew Andrews is a 70 year old active white male who developed progressive severe aortic valve stenosis and underwent successful bovine pericardial tissue valve replacement on 05/13/2013.  He exercises daily and typically goes on long 20 mile bike rides several days per week.  His blood pressure today is controlled on lisinopril 7.5 mg, Toprol-XL, which he has been taking 50 mg daily.  I've suggested he decrease this to 25 mg with his sinus  bradycardia and first-degree AV block.  He has hyperlipidemia and is on Vytorin 10/40 is tolerating this well.  He has GERD for which he takes over-the-counter Prilosec.  He will be under going complete blood work by his primary physician and I last these be sent to me for my review.  On exam, he has a normal systolic murmur consistent with his valve replacement.  There is no audible aortic insufficiency.  I have recommended that in April 2018, he undergo a follow-up of that echo evaluation.  I will see him in follow-up for cardiology evaluation and further recommendations will be made that time.  Time spent: 25 minutes  [name], MD, [facility]  11/15/2015 1:40 PM

## 2015-11-19 ENCOUNTER — Other Ambulatory Visit: Payer: Self-pay | Admitting: *Deleted

## 2015-11-19 MED ORDER — LISINOPRIL 5 MG PO TABS: 7.5000 mg | ORAL_TABLET | Freq: Every day | ORAL | Status: DC

## 2015-11-19 MED ORDER — METOPROLOL SUCCINATE ER 50 MG PO TB24: 50.0000 mg | ORAL_TABLET | Freq: Every day | ORAL | Status: DC
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
## 2016-05-10 ENCOUNTER — Other Ambulatory Visit: Payer: Self-pay | Admitting: *Deleted

## 2016-05-10 ENCOUNTER — Telehealth: Payer: Self-pay | Admitting: *Deleted

## 2016-05-10 DIAGNOSIS — Z952 Presence of prosthetic heart valve: Secondary | ICD-10-CM

## 2016-05-10 NOTE — Telephone Encounter (Signed)
Orders placed per patient request to return to cardiac rehab. Patient notified.

## 2016-05-11 ENCOUNTER — Telehealth (HOSPITAL_COMMUNITY): Payer: Self-pay | Admitting: Internal Medicine

## 2016-05-11 NOTE — Telephone Encounter (Signed)
Pt called wants to get started for Maintenance class states he will be self pay and wants to start ASAP, s/w nurse she will be calling pt back, advised pt of the return call states she can leave msg on am.... KJ

## 2016-05-24 ENCOUNTER — Telehealth: Payer: Self-pay | Admitting: Cardiovascular Disease

## 2016-05-24 NOTE — Telephone Encounter (Signed)
Did not review until after cardiac rehab called, will follow up 1/25 when I am back in the office

## 2016-05-24 NOTE — Telephone Encounter (Signed)
Gavin PottersOlinty is calling [facility] and is returning the call. Please call, thanks.

## 2016-05-26 ENCOUNTER — Other Ambulatory Visit: Payer: Self-pay

## 2016-05-26 MED ORDER — METOPROLOL SUCCINATE ER 50 MG PO TB24: 50.0000 mg | ORAL_TABLET | Freq: Every day | ORAL | 3 refills | Status: DC

## 2016-05-26 MED ORDER — LISINOPRIL 5 MG PO TABS
5.0000 mg | ORAL_TABLET | Freq: Every day | ORAL | 3 refills | Status: DC
Start: 2016-05-26 — End: 2016-08-08

## 2016-05-26 NOTE — Telephone Encounter (Signed)
Spoke with cardiac rehab and the order for rehab can be viewed but unsure if it for the maintenance program.  The person at rehab who handles maintenance program is off on Thursday's but will review tomorrow and contact office if order not correct. Advised patient and will forward to Arby BarretteWanda W in case they reach out for more information

## 2016-05-30 ENCOUNTER — Telehealth: Payer: Self-pay | Admitting: *Deleted

## 2016-05-30 DIAGNOSIS — Z952 Presence of prosthetic heart valve: Secondary | ICD-10-CM

## 2016-05-30 NOTE — Telephone Encounter (Signed)
Cardiac Rehab maintenance referral placed in Epic

## 2016-06-01 ENCOUNTER — Encounter (HOSPITAL_COMMUNITY)
Admission: RE | Admit: 2016-06-01 | Discharge: 2016-06-01 | Disposition: A | Discharge status: Home / Self Care | Payer: Self-pay | Source: Ambulatory Visit | Attending: Cardiovascular Disease | Admitting: Cardiovascular Disease

## 2016-06-01 DIAGNOSIS — Z952 Presence of prosthetic heart valve: Secondary | ICD-10-CM | POA: Insufficient documentation

## 2016-06-01 DIAGNOSIS — Z48812 Encounter for surgical aftercare following surgery on the circulatory system: Secondary | ICD-10-CM | POA: Insufficient documentation

## 2016-06-01 NOTE — Progress Notes (Signed)
Pt returned to cardiac maintenance today.  His BP was elevated at rest and with exercise.  Rest:  124/90, exercise 179/104 (rower), 190/94 (eliptical), 144/84 (airdyne).  130/90 post.   Pt asymptomatic.  Pt denies missed medication doses.  Inbasket sent to Dr. Tresa EndoKelly for advise.

## 2016-06-01 NOTE — Telephone Encounter (Signed)
Patient started today   He stated that his blood pressure was elevated during exercised today but it did come down after exercise. He was told rehab would be sending information over for Dr Tresa EndoKelly to review

## 2016-06-02 ENCOUNTER — Telehealth: Payer: Self-pay | Admitting: *Deleted

## 2016-06-02 NOTE — Telephone Encounter (Signed)
Maintenance orders faxed 

## 2016-06-02 NOTE — Telephone Encounter (Signed)
Okay.  Will await data

## 2016-06-03 ENCOUNTER — Encounter (HOSPITAL_COMMUNITY): Payer: Self-pay

## 2016-06-03 DIAGNOSIS — Z952 Presence of prosthetic heart valve: Secondary | ICD-10-CM | POA: Insufficient documentation

## 2016-06-03 DIAGNOSIS — Z48812 Encounter for surgical aftercare following surgery on the circulatory system: Secondary | ICD-10-CM | POA: Insufficient documentation

## 2016-06-06 ENCOUNTER — Encounter (HOSPITAL_COMMUNITY)
Admission: RE | Admit: 2016-06-06 | Discharge: 2016-06-06 | Disposition: A | Discharge status: Home / Self Care | Payer: Self-pay | Source: Ambulatory Visit | Attending: Cardiovascular Disease | Admitting: Cardiovascular Disease

## 2016-06-08 ENCOUNTER — Encounter (HOSPITAL_COMMUNITY)
Admission: RE | Admit: 2016-06-08 | Discharge: 2016-06-08 | Disposition: A | Discharge status: Home / Self Care | Payer: Self-pay | Source: Ambulatory Visit | Attending: Cardiovascular Disease | Admitting: Cardiovascular Disease

## 2016-06-10 ENCOUNTER — Encounter (HOSPITAL_COMMUNITY)
Admission: RE | Admit: 2016-06-10 | Discharge: 2016-06-10 | Disposition: A | Discharge status: Home / Self Care | Payer: Self-pay | Source: Ambulatory Visit | Attending: Cardiovascular Disease | Admitting: Cardiovascular Disease

## 2016-06-13 ENCOUNTER — Encounter (HOSPITAL_COMMUNITY)
Admission: RE | Admit: 2016-06-13 | Discharge: 2016-06-13 | Disposition: A | Discharge status: Home / Self Care | Payer: Self-pay | Source: Ambulatory Visit | Attending: Cardiovascular Disease | Admitting: Cardiovascular Disease

## 2016-06-15 ENCOUNTER — Encounter (HOSPITAL_COMMUNITY)
Admission: RE | Admit: 2016-06-15 | Discharge: 2016-06-15 | Disposition: A | Discharge status: Home / Self Care | Payer: Self-pay | Source: Ambulatory Visit | Attending: Cardiovascular Disease | Admitting: Cardiovascular Disease

## 2016-06-17 ENCOUNTER — Encounter (HOSPITAL_COMMUNITY)
Admission: RE | Admit: 2016-06-17 | Discharge: 2016-06-17 | Disposition: A | Discharge status: Home / Self Care | Payer: Self-pay | Source: Ambulatory Visit | Attending: Cardiovascular Disease | Admitting: Cardiovascular Disease

## 2016-06-20 ENCOUNTER — Encounter (HOSPITAL_COMMUNITY)
Admission: RE | Admit: 2016-06-20 | Discharge: 2016-06-20 | Disposition: A | Discharge status: Home / Self Care | Payer: Self-pay | Source: Ambulatory Visit | Attending: Cardiovascular Disease | Admitting: Cardiovascular Disease

## 2016-06-22 ENCOUNTER — Encounter (HOSPITAL_COMMUNITY)
Admission: RE | Admit: 2016-06-22 | Discharge: 2016-06-22 | Disposition: A | Discharge status: Home / Self Care | Payer: Self-pay | Source: Ambulatory Visit | Attending: Cardiovascular Disease | Admitting: Cardiovascular Disease

## 2016-06-24 ENCOUNTER — Encounter (HOSPITAL_COMMUNITY)
Admission: RE | Admit: 2016-06-24 | Discharge: 2016-06-24 | Disposition: A | Discharge status: Home / Self Care | Payer: Self-pay | Source: Ambulatory Visit | Attending: Cardiovascular Disease | Admitting: Cardiovascular Disease

## 2016-06-27 ENCOUNTER — Encounter (HOSPITAL_COMMUNITY)
Admission: RE | Admit: 2016-06-27 | Discharge: 2016-06-27 | Disposition: A | Discharge status: Home / Self Care | Payer: Self-pay | Source: Ambulatory Visit | Attending: Cardiovascular Disease | Admitting: Cardiovascular Disease

## 2016-06-29 ENCOUNTER — Encounter (HOSPITAL_COMMUNITY)
Admission: RE | Admit: 2016-06-29 | Discharge: 2016-06-29 | Disposition: A | Discharge status: Home / Self Care | Payer: Self-pay | Source: Ambulatory Visit | Attending: Cardiovascular Disease | Admitting: Cardiovascular Disease

## 2016-07-01 ENCOUNTER — Encounter (HOSPITAL_COMMUNITY)
Admission: RE | Admit: 2016-07-01 | Discharge: 2016-07-01 | Disposition: A | Discharge status: Home / Self Care | Payer: Self-pay | Source: Ambulatory Visit | Attending: Cardiovascular Disease | Admitting: Cardiovascular Disease

## 2016-07-01 DIAGNOSIS — Z952 Presence of prosthetic heart valve: Secondary | ICD-10-CM | POA: Insufficient documentation

## 2016-07-01 DIAGNOSIS — Z48812 Encounter for surgical aftercare following surgery on the circulatory system: Secondary | ICD-10-CM | POA: Insufficient documentation

## 2016-07-04 ENCOUNTER — Encounter (HOSPITAL_COMMUNITY)
Admission: RE | Admit: 2016-07-04 | Discharge: 2016-07-04 | Disposition: A | Discharge status: Home / Self Care | Payer: Self-pay | Source: Ambulatory Visit | Attending: Cardiovascular Disease | Admitting: Cardiovascular Disease

## 2016-07-06 ENCOUNTER — Encounter (HOSPITAL_COMMUNITY)
Admission: RE | Admit: 2016-07-06 | Discharge: 2016-07-06 | Disposition: A | Discharge status: Home / Self Care | Payer: Self-pay | Source: Ambulatory Visit | Attending: Cardiovascular Disease | Admitting: Cardiovascular Disease

## 2016-07-08 ENCOUNTER — Encounter (HOSPITAL_COMMUNITY): Payer: Self-pay

## 2016-07-11 ENCOUNTER — Encounter (HOSPITAL_COMMUNITY)
Admission: RE | Admit: 2016-07-11 | Discharge: 2016-07-11 | Disposition: A | Discharge status: Home / Self Care | Payer: Self-pay | Source: Ambulatory Visit | Attending: Cardiovascular Disease | Admitting: Cardiovascular Disease

## 2016-07-13 ENCOUNTER — Encounter (HOSPITAL_COMMUNITY)
Admission: RE | Admit: 2016-07-13 | Discharge: 2016-07-13 | Disposition: A | Discharge status: Home / Self Care | Payer: Self-pay | Source: Ambulatory Visit | Attending: Cardiovascular Disease | Admitting: Cardiovascular Disease

## 2016-07-15 ENCOUNTER — Encounter (HOSPITAL_COMMUNITY)
Admission: RE | Admit: 2016-07-15 | Discharge: 2016-07-15 | Disposition: A | Discharge status: Home / Self Care | Payer: Self-pay | Source: Ambulatory Visit | Attending: Cardiovascular Disease | Admitting: Cardiovascular Disease

## 2016-07-18 ENCOUNTER — Encounter (HOSPITAL_COMMUNITY)
Admission: RE | Admit: 2016-07-18 | Discharge: 2016-07-18 | Disposition: A | Discharge status: Home / Self Care | Payer: Self-pay | Source: Ambulatory Visit | Attending: Cardiovascular Disease | Admitting: Cardiovascular Disease

## 2016-07-20 ENCOUNTER — Encounter (HOSPITAL_COMMUNITY)
Admission: RE | Admit: 2016-07-20 | Discharge: 2016-07-20 | Disposition: A | Discharge status: Home / Self Care | Payer: Self-pay | Source: Ambulatory Visit | Attending: Cardiovascular Disease | Admitting: Cardiovascular Disease

## 2016-07-22 ENCOUNTER — Encounter (HOSPITAL_COMMUNITY)
Admission: RE | Admit: 2016-07-22 | Discharge: 2016-07-22 | Disposition: A | Discharge status: Home / Self Care | Payer: Self-pay | Source: Ambulatory Visit | Attending: Cardiovascular Disease | Admitting: Cardiovascular Disease

## 2016-07-25 ENCOUNTER — Encounter (HOSPITAL_COMMUNITY)
Admission: RE | Admit: 2016-07-25 | Discharge: 2016-07-25 | Disposition: A | Discharge status: Home / Self Care | Payer: Self-pay | Source: Ambulatory Visit | Attending: Cardiovascular Disease | Admitting: Cardiovascular Disease

## 2016-07-27 ENCOUNTER — Encounter (HOSPITAL_COMMUNITY)
Admission: RE | Admit: 2016-07-27 | Discharge: 2016-07-27 | Disposition: A | Discharge status: Home / Self Care | Payer: Self-pay | Source: Ambulatory Visit | Attending: Cardiovascular Disease | Admitting: Cardiovascular Disease

## 2016-07-29 ENCOUNTER — Encounter (HOSPITAL_COMMUNITY)
Admission: RE | Admit: 2016-07-29 | Discharge: 2016-07-29 | Disposition: A | Discharge status: Home / Self Care | Payer: Self-pay | Source: Ambulatory Visit | Attending: Cardiovascular Disease | Admitting: Cardiovascular Disease

## 2016-08-01 ENCOUNTER — Encounter (HOSPITAL_COMMUNITY)
Admission: RE | Admit: 2016-08-01 | Discharge: 2016-08-01 | Disposition: A | Discharge status: Home / Self Care | Payer: Self-pay | Source: Ambulatory Visit | Attending: Cardiovascular Disease | Admitting: Cardiovascular Disease

## 2016-08-01 DIAGNOSIS — Z48812 Encounter for surgical aftercare following surgery on the circulatory system: Secondary | ICD-10-CM | POA: Insufficient documentation

## 2016-08-01 DIAGNOSIS — Z952 Presence of prosthetic heart valve: Secondary | ICD-10-CM | POA: Insufficient documentation

## 2016-08-02 ENCOUNTER — Ambulatory Visit (HOSPITAL_COMMUNITY): Payer: Medicare Other | Attending: Cardiology

## 2016-08-02 ENCOUNTER — Other Ambulatory Visit: Payer: Self-pay

## 2016-08-02 DIAGNOSIS — Z87891 Personal history of nicotine dependence: Secondary | ICD-10-CM | POA: Insufficient documentation

## 2016-08-02 DIAGNOSIS — I081 Rheumatic disorders of both mitral and tricuspid valves: Secondary | ICD-10-CM | POA: Insufficient documentation

## 2016-08-02 DIAGNOSIS — Z953 Presence of xenogenic heart valve: Secondary | ICD-10-CM | POA: Diagnosis not present

## 2016-08-02 DIAGNOSIS — E785 Hyperlipidemia, unspecified: Secondary | ICD-10-CM | POA: Insufficient documentation

## 2016-08-02 DIAGNOSIS — I10 Essential (primary) hypertension: Secondary | ICD-10-CM

## 2016-08-02 DIAGNOSIS — Z952 Presence of prosthetic heart valve: Secondary | ICD-10-CM | POA: Diagnosis not present

## 2016-08-02 DIAGNOSIS — I350 Nonrheumatic aortic (valve) stenosis: Secondary | ICD-10-CM | POA: Diagnosis not present

## 2016-08-03 ENCOUNTER — Encounter (HOSPITAL_COMMUNITY)
Admission: RE | Admit: 2016-08-03 | Discharge: 2016-08-03 | Disposition: A | Discharge status: Home / Self Care | Payer: Self-pay | Source: Ambulatory Visit | Attending: Cardiovascular Disease | Admitting: Cardiovascular Disease

## 2016-08-05 ENCOUNTER — Encounter (HOSPITAL_COMMUNITY)
Admission: RE | Admit: 2016-08-05 | Discharge: 2016-08-05 | Disposition: A | Discharge status: Home / Self Care | Payer: Self-pay | Source: Ambulatory Visit | Attending: Cardiovascular Disease | Admitting: Cardiovascular Disease

## 2016-08-08 ENCOUNTER — Ambulatory Visit (INDEPENDENT_AMBULATORY_CARE_PROVIDER_SITE_OTHER): Payer: Medicare Other | Admitting: Cardiovascular Disease

## 2016-08-08 ENCOUNTER — Encounter (HOSPITAL_COMMUNITY)
Admission: RE | Admit: 2016-08-08 | Discharge: 2016-08-08 | Disposition: A | Discharge status: Home / Self Care | Payer: Self-pay | Source: Ambulatory Visit | Attending: Cardiovascular Disease | Admitting: Cardiovascular Disease

## 2016-08-08 ENCOUNTER — Encounter: Payer: Self-pay | Admitting: Cardiovascular Disease

## 2016-08-08 DIAGNOSIS — K219 Gastro-esophageal reflux disease without esophagitis: Secondary | ICD-10-CM

## 2016-08-08 DIAGNOSIS — Z952 Presence of prosthetic heart valve: Secondary | ICD-10-CM

## 2016-08-08 DIAGNOSIS — I10 Essential (primary) hypertension: Secondary | ICD-10-CM | POA: Diagnosis not present

## 2016-08-08 DIAGNOSIS — I350 Nonrheumatic aortic (valve) stenosis: Secondary | ICD-10-CM

## 2016-08-08 DIAGNOSIS — E7800 Pure hypercholesterolemia, unspecified: Secondary | ICD-10-CM

## 2016-08-08 MED ORDER — LISINOPRIL 10 MG PO TABS: 10.0000 mg | ORAL_TABLET | Freq: Every day | ORAL | 3 refills | Status: DC

## 2016-08-08 MED ORDER — EZETIMIBE-SIMVASTATIN 10-40 MG PO TABS: 1.0000 | ORAL_TABLET | Freq: Every day | ORAL | 3 refills | Status: DC

## 2016-08-08 NOTE — Patient Instructions (Signed)
Your physician has recommended you make the following change in your medication:   1.) the lisinopril has been increased from 5 mg to 10 mg daily a new prescription has been sent to your pharmacy.  Your physician wants you to follow-up in: 6 months or sooner if needed. You will receive a reminder letter in the mail two months in advance. If you don't receive a letter, please call our office to schedule the follow-up appointment.

## 2016-08-08 NOTE — Progress Notes (Signed)
Patient ID: Drew Andrews, male   DOB: 04-Aug-1945, 71 y.o.   MRN: [id number]     HPI: Drew Andrews is a 71 y.o. male who presents for a 9 month follow-up cardiology evaluation.  Drew Andrews was first told of having a heart murmur back in the 1980s when he was planning to work at  [employer] and had a physical exam done at that time. I have  seen him since 2005 when he was referred to me by [name] for cardiac murmur. Since 2005 he had undergone follow-up  echo Doppler studies to assess his aortic valve. In 2005 his aortic transvalvular gradient was 37 with a mean gradient of 18. Over the last 9 years, his aortic valve murmur gradually become more significant and he remained asymptomatic.  An echo in 11/03/2014showed normal LV function with  grade 2 diastolic dysfunction. His aortic valve again was moderately calcified. The mean gradient had increased to 47 and peak instantaneous gradient 86 giving a calculated aortic valve area of 0.63 cm. He did have mitral annular calcification. His left atrium was moderately dilated. He did have very mild RV dilatation.  When I saw him in November 2014 Drew Andrews was continuing to exercise but there was new development of exertional shortness of breath. At that time, I strongly recommended cardiac catheterization and he underwent right and left heart cardiac catheterization on 03/15/2013. This confirmed severe aortic valve stenosis mild pulmonary hypertension. He had mild chronic calcification but nonobstructive 20% narrowing in the LAD.   He underwent successful aortic valve replacement surgery by Dr. [name] 05/13/2013 and had a 25 mm [facility] Ease bovine pericardial valve inserted. Socially, he has done remarkably well following his valve replacement surgery. He denies any episodes of palpitations. He has resumed activity.  Since his valve replacement, he has continues to feel well.  He denies any shortness of breath  or chest pain.  He does exercise regularly.  Typically in the cold winter months.  He likes to exercise at cardiac rehabilitation for the months of January and February..  However, he has had several rare occurrences of diplopia it are short lived.  He has been taking aspirin 81 mg daily.  A repeat echo Doppler study on 12/23/2013.revealed an EF 55-60% with mild LVH.  His aortic prosthesis was well-seated and open well.  There was only trivial aortic insufficiency.  He did have mild dilatation of his left atrium and mitral annular calcification.  There also was some dilatation of his RV.    His last echo in October 2016 continued to show excellent LV function with an EF of 55-60% and he had normal diastolic parameters.  The bioprosthetic AV was  functioning normally.  The gradients were felt to be very minimally elevated and unchanged from his previous study with a mean gradient of 11 and peak gradient of 23 mm.  There was no evidence for aortic insufficiency.  There was evidence for mitral annular calcification and mild LA dilatation.  He underwent a 2-D echo Doppler study on 08/02/2016.  He had normal LV function with EF of 60-65% and grade 2 diastolic dysfunction.  His aortic bioprosthesis was well-seated and was functioning well.  The mean gradient was 11 and peak gradient 20.  He had mild MR.  There was mild TR and mild LA dilatation.    Since I last saw him, he continues to do well.  He has continued to separate the maintenance phase of cardiac rehabilitation  over the winter months and will be ending this week.  Typically over the summer he rides his bike.  On days he does not do cardiac rehabilitation in the winter he often walks and if the weather is nice.  He may ride his bike occasionally.  He typically gained some weight over the winter months but loses weight over the summer when he is more active outside.  He denies any chest pain, presyncope or syncope, palpitations, or change in exercise  tolerance.  Recently, he has noticed his blood pressure being slightly elevated in the 130s to 140s when he arrives at cardiac rehabilitation after exercise his blood pressure is normal.  He has increased his lisinopril to 7.5 mg. He presents for reevaluation.  Past Medical History:  Diagnosis Date  . GERD (gastroesophageal reflux disease)   . Hyperlipidemia   . Hypertension   . LVH (left ventricular hypertrophy)    with aortic stenosis-bicuspid  . PONV (postoperative nausea and vomiting)    as a child  . S/P AVR (aortic valve replacement) 05/13/2013  . Seasonal allergies     Past Surgical History:  Procedure Laterality Date  . AORTIC VALVE REPLACEMENT N/A 05/13/2013   Procedure: AORTIC VALVE REPLACEMENT (AVR);  Surgeon: [name], MD;  Location: [facility];  Service: Open Heart Surgery;  Laterality: N/A;  . APPENDECTOMY  age 84  . CARDIAC CATHETERIZATION    . COLONOSCOPY     X 2  . HIATAL HERNIA REPAIR    . INTRAOPERATIVE TRANSESOPHAGEAL ECHOCARDIOGRAM N/A 05/13/2013   Procedure: INTRAOPERATIVE TRANSESOPHAGEAL ECHOCARDIOGRAM;  Surgeon: [name], MD;  Location: [facility] OR;  Service: Open Heart Surgery;  Laterality: N/A;  . LEFT HEART CATHETERIZATION WITH CORONARY ANGIOGRAM N/A 03/15/2013   Procedure: LEFT HEART CATHETERIZATION WITH CORONARY ANGIOGRAM;  Surgeon: [name], MD;  Location: [facility] CATH LAB;  Service: Cardiovascular;  Laterality: N/A;  . TONSILLECTOMY    . TRANSTHORACIC ECHOCARDIOGRAM  03/04/2013   EF 14-97%, grade 2 diastolic dysfunction, AV with mod calcified leaflets & mild regurg, calcified MV annulus, LA mod dilated, RV mildly dilated    Allergies  Allergen Reactions  . Scopolamine     seizure    Current Outpatient Prescriptions  Medication Sig Dispense Refill  . amoxicillin (AMOXIL) 500 MG tablet Take 4 tablets 1 hour prior to dental procedure 4 tablet 11  . aspirin 81 MG tablet Take 81 mg by mouth daily.    [name] ezetimibe-simvastatin (VYTORIN) 10-40 MG tablet  Take 1 tablet by mouth at bedtime. 90 tablet 3  . Ibuprofen-Diphenhydramine Cit (ADVIL PM PO) Take 2-3 tablets by mouth at bedtime as needed (for sleep).     . meloxicam (MOBIC) 15 MG tablet Take 1 tablet by mouth as needed for pain.     . metoprolol succinate (TOPROL-XL) 50 MG 24 hr tablet Take 1 tablet (50 mg total) by mouth daily. Take with or immediately following a meal. 90 tablet 3  . omeprazole (PRILOSEC OTC) 20 MG tablet Take 20 mg by mouth daily.     [name] lisinopril (PRINIVIL,ZESTRIL) 10 MG tablet Take 1 tablet (10 mg total) by mouth daily. 90 tablet 3   No current facility-administered medications for this visit.     Social History   Social History  . Marital status: Married    Spouse name: N/A  . Number of children: 1  . Years of education: college   Occupational History  . Not on file.   Social History Main Topics  .  Smoking status: Former Smoker    Packs/day: 1.00    Quit date: 02/10/1974  . Smokeless tobacco: Never Used  . Alcohol use Yes     Comment: Drinks 1 bottle of wine daily and 1 beer  . Drug use: No  . Sexual activity: Not on file   Other Topics Concern  . Not on file   Social History Narrative  . No narrative on file   Additional social history is notable in that he has a Paediatric nurse in IT sales professional. He is retired from [employer]. He quit tobacco in 1979. He continues to exercise 7 days per week.  Family History  Problem Relation Age of Onset  . Hypertension Mother   . Hyperlipidemia Mother   . Diabetes Mother   . Heart attack Father   . Acute myelogenous leukemia Brother   . Pneumonia Maternal Grandmother   . Heart attack Maternal Grandfather   . Colon cancer Neg Hx   . Stomach cancer Neg Hx    ROS General: Negative; No fevers, chills, or night sweats;  HEENT: Negative; No changes in vision or hearing, sinus congestion, difficulty swallowing Pulmonary: Negative; No cough, wheezing, shortness of breath,  hemoptysis Cardiovascular: Negative; No chest pain, presyncope, syncope, palpitations GI: Negative; No nausea, vomiting, diarrhea, or abdominal pain GU: Negative; No dysuria, hematuria, or difficulty voiding Musculoskeletal: Mild arthritic issues of his elbows and knees; no myalgias, joint pain, or weakness Hematologic/Oncology: Negative; no easy bruising, bleeding Endocrine: Negative; no heat/cold intolerance; no diabetes Neuro: Rare episodes of diplopia; no changes in balance, headaches Skin: Negative; No rashes or skin lesions Psychiatric: Negative; No behavioral problems, depression Sleep: Negative; No snoring, daytime sleepiness, hypersomnolence, bruxism, restless legs, hypnogognic hallucinations, no cataplexy Other comprehensive 14 point system review is negative.   PE BP 136/90   Pulse (!) 58   Ht _0  (1.753 m)   Wt 191 lb (86.6 kg)   BMI 28.21 kg/m  Repeat blood pressure was 142.86  Wt Readings from Last 3 Encounters:  08/08/16 191 lb (86.6 kg)  11/13/15 180 lb 3.2 oz (81.7 kg)  03/16/15 191 lb 3.2 oz (86.7 kg)   General: Alert, oriented, no distress.  Skin: normal turgor, no rashes HEENT: Normocephalic, atraumatic. Pupils round and reactive; sclera anicteric;no lid lag.  Nose without nasal septal hypertrophy Mouth/Parynx benign; Mallinpatti scale 2 Neck: No JVD; transmitted murmurs to his carotids bilaterally Lungs: clear to ausculatation and percussion; no wheezing or rales No chest wall tenderness to palpation Heart: RRR, s1 s2 normal; 1- 2/6 systolic murmur concordant with his aortic valve replacement. No AR murmur. No S3 or S4 gallop. No rubs thrills or heaves.  Abdomen: soft, nontender; no hepatosplenomehaly, BS+; abdominal aorta nontender and not dilated by palpation. Back: No CVA tenderness; minimal residual ecchymosis inferior to his left buttock Pulses 2+ Femoral pulses 2+ without bruits. Extremities: no clubbing cyanosis or edema, Homan's sign negative   Neurologic: grossly nonfocal; normal motor strength Psychologic: normal affect and mood.  ECG (independently read by me): Sinus bradycardia at 58 bpm.  First degree AV block with appearing liver 27 ms.  Borderline LVH by voltage criteria.  July 2017 ECG (independently read by me): Sinus bradycardia at 53 bpm.  Mild first-degree AV block with a PR interval at 226 ms.  Borderline LVH by voltage criteria.  Mild T wave abnormality in 3 and aVF.  November 2016 ECG (independently read by me): Normal sinus rhythm at 65 bpm.  First-degree AV block with  a PR interval at 244 ms.  October 2015 ECG disease independently read by me): Normal sinus rhythm at 57 beats per minute.  Mild first degree AV block.  Prior 07/08/2013 EKG (independently read by the) normal sinus rhythm. Non-specific ST-T changes inferolaterally.  Prior 03/12/13 ECG: Sinus rhythm at 66 beats per minute. First-degree AV block. LVH by voltage criteria. Mild nondiagnostic T changes.  LABS:  BMP Latest Ref Rng & Units 07/08/2013 05/15/2013 05/14/2013  Glucose 70 - 99 mg/dL 97 130(H) 123(H)  BUN 6 - 23 mg/dL _0 Creatinine 0.50 - 1.35 mg/dL 1.02 0.95 1.00  Sodium 135 - 145 mEq/L 139 137 139  Potassium 3.5 - 5.3 mEq/L 4.5 4.4 4.0  Chloride 96 - 112 mEq/L 108 102 103  CO2 19 - 32 mEq/L 22 22 -  Calcium 8.4 - 10.5 mg/dL 8.7 7.8(L) -   Hepatic Function Latest Ref Rng & Units 07/08/2013 05/09/2013 03/12/2013  Total Protein 6.0 - 8.3 g/dL 6.5 7.4 7.0  Albumin 3.5 - 5.2 g/dL 4.1 4.2 4.6  AST 0 - 37 U/L _1 ALT 0 - 53 U/L 21 33 18  Alk Phosphatase 39 - 117 U/L 44 43 39  Total Bilirubin 0.2 - 1.2 mg/dL 0.4 0.5 0.7   CBC Latest Ref Rng & Units 08/19/2013 07/08/2013 05/17/2013  WBC 4.0 - 10.5 K/uL 6.8 6.8 6.7  Hemoglobin 13.0 - 17.0 g/dL 13.4 12.3(L) 9.4(L)  Hematocrit 39.0 - 52.0 % 39.3 36.7(L) 27.3(L)  Platelets 150 - 400 K/uL 170 166 84(L)   Lab Results  Component Value Date   MCV 92.0 08/19/2013   MCV 95.1 07/08/2013   MCV  96.8 05/17/2013   Lab Results  Component Value Date   TSH 1.554 07/08/2013   Lab Results  Component Value Date   HGBA1C 5.5 05/09/2013   Lipid Panel     Component Value Date/Time   CHOL 122 07/08/2013 0856   TRIG 89 07/08/2013 0856   HDL 42 07/08/2013 0856   CHOLHDL 2.9 07/08/2013 0856   VLDL 18 07/08/2013 0856   LDLCALC 62 07/08/2013 0856     RADIOLOGY: No results found.  IMPRESSION:  1. Essential hypertension   2. Severe aortic stenosis   3. S/P AVR (aortic valve replacement)   4. Pure hypercholesterolemia   5. GERD without esophagitis     ASSESSMENT AND PLAN: Drew Andrews is a 71 year old active white male who developed progressive severe aortic valve stenosis and underwent successful bovine pericardial tissue valve replacement on 05/13/2013.  He has a history of hypertension .  His blood pressure today is mildly elevated based on new guidelines on his regimen consisting of lisinopril 7.5 mg and metoprolol succinate 50 mg daily.  I have recommended further titration of lisinopril to 10 mg daily.  His weight typically fluctuates between the winter and summer months.  When I last saw him, he was 180 pounds and today is 191, but this weight is similar to his weight last year and not in the summer.  Dr.Tisovic had checked blood work on him this past summer.  He was told that his results were stable.  He continues to take Vytorin 10/40 for hyper lipidemia.  He takes amoxicillin prophylactically prior to dental procedures.  He has a history of GERD for which he is taking omeprazole.  Time spent: 25 minutes  [name], MD, [facility]  08/08/2016 8:52 AM

## 2016-08-10 ENCOUNTER — Encounter (HOSPITAL_COMMUNITY)
Admission: RE | Admit: 2016-08-10 | Discharge: 2016-08-10 | Disposition: A | Discharge status: Home / Self Care | Payer: Self-pay | Source: Ambulatory Visit | Attending: Cardiovascular Disease | Admitting: Cardiovascular Disease

## 2016-08-12 ENCOUNTER — Encounter (HOSPITAL_COMMUNITY)
Admission: RE | Admit: 2016-08-12 | Discharge: 2016-08-12 | Disposition: A | Discharge status: Home / Self Care | Payer: Self-pay | Source: Ambulatory Visit | Attending: Cardiovascular Disease | Admitting: Cardiovascular Disease

## 2016-08-15 ENCOUNTER — Encounter (HOSPITAL_COMMUNITY): Payer: Self-pay

## 2016-08-17 ENCOUNTER — Encounter (HOSPITAL_COMMUNITY): Payer: Self-pay

## 2016-08-19 ENCOUNTER — Encounter (HOSPITAL_COMMUNITY): Payer: Self-pay

## 2016-08-22 ENCOUNTER — Encounter (HOSPITAL_COMMUNITY): Payer: Self-pay

## 2016-08-24 ENCOUNTER — Encounter (HOSPITAL_COMMUNITY): Payer: Self-pay

## 2016-08-26 ENCOUNTER — Encounter (HOSPITAL_COMMUNITY): Payer: Self-pay

## 2016-08-29 ENCOUNTER — Encounter (HOSPITAL_COMMUNITY): Payer: Self-pay

## 2016-12-27 ENCOUNTER — Telehealth: Payer: Self-pay | Admitting: Cardiovascular Disease

## 2016-12-27 NOTE — Telephone Encounter (Signed)
Pt wants to know if he needs an Echo before his appt in November?

## 2016-12-28 NOTE — Telephone Encounter (Signed)
Returned call to patient, advised he had Echo in April and was stable.   No mention of repeat at this time.   Patient aware and verbalized understanding.

## 2017-03-01 ENCOUNTER — Encounter: Payer: Self-pay | Admitting: Cardiovascular Disease

## 2017-03-01 ENCOUNTER — Ambulatory Visit (INDEPENDENT_AMBULATORY_CARE_PROVIDER_SITE_OTHER): Payer: Medicare Other | Admitting: Cardiovascular Disease

## 2017-03-01 VITALS — BP 178/91 | HR 52 | Ht 69.0 in | Wt 187.4 lb

## 2017-03-01 DIAGNOSIS — M25473 Effusion, unspecified ankle: Secondary | ICD-10-CM | POA: Diagnosis not present

## 2017-03-01 DIAGNOSIS — E7800 Pure hypercholesterolemia, unspecified: Secondary | ICD-10-CM

## 2017-03-01 DIAGNOSIS — I10 Essential (primary) hypertension: Secondary | ICD-10-CM | POA: Diagnosis not present

## 2017-03-01 DIAGNOSIS — K219 Gastro-esophageal reflux disease without esophagitis: Secondary | ICD-10-CM | POA: Diagnosis not present

## 2017-03-01 DIAGNOSIS — Z952 Presence of prosthetic heart valve: Secondary | ICD-10-CM | POA: Diagnosis not present

## 2017-03-01 DIAGNOSIS — I350 Nonrheumatic aortic (valve) stenosis: Secondary | ICD-10-CM

## 2017-03-01 MED ORDER — LISINOPRIL 10 MG PO TABS: 15.0000 mg | ORAL_TABLET | Freq: Every day | ORAL | 0 refills | Status: DC

## 2017-03-01 NOTE — Progress Notes (Signed)
Patient ID: Drew Andrews, male   DOB: 09/11/1945, 71 y.o.   MRN: [id number]     HPI: Drew Andrews is a 71 y.o. male who presents for a 9 month follow-up cardiology evaluation.  Drew Andrews was first told of having a heart murmur back in the 1980s when he was planning to work at  [employer] and had a physical exam done at that time. I have  seen him since 2005 when he was referred to me by [name] for cardiac murmur. Since 2005 he had undergone follow-up  echo Doppler studies to assess his aortic valve. In 2005 his aortic transvalvular gradient was 37 with a mean gradient of 18. Over the last 9 years, his aortic valve murmur gradually become more significant and he remained asymptomatic.  An echo in 11/03/2014showed normal LV function with  grade 2 diastolic dysfunction. His aortic valve again was moderately calcified. The mean gradient had increased to 47 and peak instantaneous gradient 86 giving a calculated aortic valve area of 0.63 cm. He did have mitral annular calcification. His left atrium was moderately dilated. He did have very mild RV dilatation.  When I saw him in November 2014 Drew Andrews was continuing to exercise but there was new development of exertional shortness of breath. At that time, I strongly recommended cardiac catheterization and he underwent right and left heart cardiac catheterization on 03/15/2013. This confirmed severe aortic valve stenosis mild pulmonary hypertension. He had mild chronic calcification but nonobstructive 20% narrowing in the LAD.   He underwent successful aortic valve replacement surgery by Dr. [name] 05/13/2013 and had a 25 mm [facility] Ease bovine pericardial valve inserted. Socially, he has done remarkably well following his valve replacement surgery. He denies any episodes of palpitations. He has resumed activity.  Since his valve replacement, he has continues to feel well.  He denies any shortness of breath  or chest pain.  He does exercise regularly.  Typically in the cold winter months.  He likes to exercise at cardiac rehabilitation for the months of January and February..  However, he has had several rare occurrences of diplopia it are short lived.  He has been taking aspirin 81 mg daily.  A repeat echo Doppler study on 12/23/2013.revealed an EF 55-60% with mild LVH.  His aortic prosthesis was well-seated and open well.  There was only trivial aortic insufficiency.  He did have mild dilatation of his left atrium and mitral annular calcification.  There also was some dilatation of his RV.    His last echo in October 2016 continued to show excellent LV function with an EF of 55-60% and he had normal diastolic parameters.  The bioprosthetic AV was  functioning normally.  The gradients were felt to be very minimally elevated and unchanged from his previous study with a mean gradient of 11 and peak gradient of 23 mm.  There was no evidence for aortic insufficiency.  There was evidence for mitral annular calcification and mild LA dilatation.  He underwent a 2-D echo Doppler study on 08/02/2016.  He had normal LV function with EF of 60-65% and grade 2 diastolic dysfunction.  His aortic bioprosthesis was well-seated and was functioning well.  The mean gradient was 11 and peak gradient 20.  He had mild MR.  There was mild TR and mild LA dilatation.    He participate the maintenance phase of cardiac rehabilitation over the winter months and will be ending this week.  Typically over the  summer he rides his bike.  On days he does not do cardiac rehabilitation in the winter he often walks and if the weather is nice.  He may ride his bike occasionally.  He typically gained some weight over the winter months but loses weight over the summer when he is more active outside.  He denies any chest pain, presyncope or syncope, palpitations, or change in exercise tolerance.  Recently, he has noticed his blood pressure being  slightly elevated in the 130s to 140s when he arrives at cardiac rehabilitation after exercise his blood pressure is normal.  He has increased his lisinopril to 7.5 mg.   Since I last saw him .  He denies any episodes of chest pain or shortness of breath.  He exercises every day and walks for at least 30 minutes or bikes 7 days per week.  He tells me his blood pressure when he participates in rehabilitation is 130/78.  He plans to reenlist in the maintenance phase for December January and February of the upcoming winter.  He is unaware of any palpitations.  At times he admits to trace edema.  He had lab work by Dr. Rosana Hoes.  He presents for evaluation.  Past Medical History:  Diagnosis Date  . GERD (gastroesophageal reflux disease)   . Hyperlipidemia   . Hypertension   . LVH (left ventricular hypertrophy)    with aortic stenosis-bicuspid  . PONV (postoperative nausea and vomiting)    as a child  . S/P AVR (aortic valve replacement) 05/13/2013  . Seasonal allergies     Past Surgical History:  Procedure Laterality Date  . AORTIC VALVE REPLACEMENT N/A 05/13/2013   Procedure: AORTIC VALVE REPLACEMENT (AVR);  Surgeon: [name], MD;  Location: [facility];  Service: Open Heart Surgery;  Laterality: N/A;  . APPENDECTOMY  age 13  . CARDIAC CATHETERIZATION    . COLONOSCOPY     X 2  . HIATAL HERNIA REPAIR    . INTRAOPERATIVE TRANSESOPHAGEAL ECHOCARDIOGRAM N/A 05/13/2013   Procedure: INTRAOPERATIVE TRANSESOPHAGEAL ECHOCARDIOGRAM;  Surgeon: [name], MD;  Location: [facility] OR;  Service: Open Heart Surgery;  Laterality: N/A;  . LEFT HEART CATHETERIZATION WITH CORONARY ANGIOGRAM N/A 03/15/2013   Procedure: LEFT HEART CATHETERIZATION WITH CORONARY ANGIOGRAM;  Surgeon: [name], MD;  Location: [facility] CATH LAB;  Service: Cardiovascular;  Laterality: N/A;  . TONSILLECTOMY    . TRANSTHORACIC ECHOCARDIOGRAM  03/04/2013   EF 39-03%, grade 2 diastolic dysfunction, AV with mod calcified leaflets & mild  regurg, calcified MV annulus, LA mod dilated, RV mildly dilated    Allergies  Allergen Reactions  . Scopolamine     seizure    Current Outpatient Prescriptions  Medication Sig Dispense Refill  . amoxicillin (AMOXIL) 500 MG tablet Take 4 tablets 1 hour prior to dental procedure 4 tablet 11  . aspirin 81 MG tablet Take 81 mg by mouth daily.    [name] ezetimibe-simvastatin (VYTORIN) 10-40 MG tablet Take 1 tablet by mouth at bedtime. 90 tablet 3  . Ibuprofen-Diphenhydramine Cit (ADVIL PM PO) Take 2-3 tablets by mouth at bedtime as needed (for sleep).     [name] lisinopril (PRINIVIL,ZESTRIL) 10 MG tablet Take 1.5 tablets (15 mg total) by mouth daily. 135 tablet 0  . meloxicam (MOBIC) 15 MG tablet Take 1 tablet by mouth as needed for pain.     . metoprolol succinate (TOPROL-XL) 50 MG 24 hr tablet Take 1 tablet (50 mg total) by mouth daily. Take with or immediately following  a meal. 90 tablet 3  . omeprazole (PRILOSEC OTC) 20 MG tablet Take 20 mg by mouth daily.      No current facility-administered medications for this visit.     Social History   Social History  . Marital status: Married    Spouse name: N/A  . Number of children: 1  . Years of education: college   Occupational History  . Not on file.   Social History Main Topics  . Smoking status: Former Smoker    Packs/day: 1.00    Quit date: 02/10/1974  . Smokeless tobacco: Never Used  . Alcohol use Yes     Comment: Drinks 1 bottle of wine daily and 1 beer  . Drug use: No  . Sexual activity: Not on file   Other Topics Concern  . Not on file   Social History Narrative  . No narrative on file   Additional social history is notable in that he has a Paediatric nurse in IT sales professional. He is retired from [employer]. He quit tobacco in 1979. He continues to exercise 7 days per week.  Family History  Problem Relation Age of Onset  . Hypertension Mother   . Hyperlipidemia Mother   . Diabetes Mother   . Heart  attack Father   . Acute myelogenous leukemia Brother   . Pneumonia Maternal Grandmother   . Heart attack Maternal Grandfather   . Colon cancer Neg Hx   . Stomach cancer Neg Hx    ROS General: Negative; No fevers, chills, or night sweats;  HEENT: Negative; No changes in vision or hearing, sinus congestion, difficulty swallowing Pulmonary: Negative; No cough, wheezing, shortness of breath, hemoptysis Cardiovascular: Negative; No chest pain, presyncope, syncope, palpitations GI: Negative; No nausea, vomiting, diarrhea, or abdominal pain GU: Negative; No dysuria, hematuria, or difficulty voiding Musculoskeletal: Mild arthritic issues of his elbows and knees; no myalgias, joint pain, or weakness Hematologic/Oncology: Negative; no easy bruising, bleeding Endocrine: Negative; no heat/cold intolerance; no diabetes Neuro: Rare episodes of diplopia; no changes in balance, headaches Skin: Negative; No rashes or skin lesions Psychiatric: Negative; No behavioral problems, depression Sleep: Negative; No snoring, daytime sleepiness, hypersomnolence, bruxism, restless legs, hypnogognic hallucinations, no cataplexy Other comprehensive 14 point system review is negative.   PE BP (!) 178/91   Pulse (!) 52   Ht 5' 9"  (1.753 m)   Wt 187 lb 6.4 oz (85 kg)   BMI 27.67 kg/m   Repeat blood pressure by me was 152/88  Wt Readings from Last 3 Encounters:  03/01/17 187 lb 6.4 oz (85 kg)  08/08/16 191 lb (86.6 kg)  11/13/15 180 lb 3.2 oz (81.7 kg)   General: Alert, oriented, no distress.  Skin: normal turgor, no rashes, warm and dry HEENT: Normocephalic, atraumatic. Pupils equal round and reactive to light; sclera anicteric; extraocular muscles intact;  Nose without nasal septal hypertrophy Mouth/Parynx benign; Mallinpatti scale 3 Neck: No JVD, no carotid bruits; normal carotid upstroke Lungs: clear to ausculatation and percussion; no wheezing or rales Chest wall: without tenderness to  palpitation Heart: PMI not displaced, RRR, s1 s2 normal, 2/6 systolic murmur secondary to his AVR, no diastolic murmur, no rubs, gallops, thrills, or heaves Abdomen: soft, nontender; no hepatosplenomehaly, BS+; abdominal aorta nontender and not dilated by palpation. Back: no CVA tenderness Pulses 2+ Musculoskeletal: full range of motion, normal strength, no joint deformities Extremities: Trace ankle edema, no clubbing,cyanosis, Homan's sign negative  Neurologic: grossly nonfocal; Cranial nerves grossly wnl Psychologic: Normal mood and  affect   ECG (independently read by me): Sinus bradycardia 58 bpm.  First-degree AV block.  LVH by voltage.  April 2018 ECG (independently read by me): Sinus bradycardia at 58 bpm.  First degree AV block Borderline LVH by voltage criteria.  July 2017 ECG (independently read by me): Sinus bradycardia at 53 bpm.  Mild first-degree AV block with a PR interval at 226 ms.  Borderline LVH by voltage criteria.  Mild T wave abnormality in 3 and aVF.  November 2016 ECG (independently read by me): Normal sinus rhythm at 65 bpm.  First-degree AV block with a PR interval at 244 ms.  October 2015 ECG disease independently read by me): Normal sinus rhythm at 57 beats per minute.  Mild first degree AV block.  Prior 07/08/2013 EKG (independently read by the) normal sinus rhythm. Non-specific ST-T changes inferolaterally.  Prior 03/12/13 ECG: Sinus rhythm at 66 beats per minute. First-degree AV block. LVH by voltage criteria. Mild nondiagnostic T changes.  LABS:  BMP Latest Ref Rng & Units 07/08/2013 05/15/2013 05/14/2013  Glucose 70 - 99 mg/dL 97 130(H) 123(H)  BUN 6 - 23 mg/dL 19 13 16   Creatinine 0.50 - 1.35 mg/dL 1.02 0.95 1.00  Sodium 135 - 145 mEq/L 139 137 139  Potassium 3.5 - 5.3 mEq/L 4.5 4.4 4.0  Chloride 96 - 112 mEq/L 108 102 103  CO2 19 - 32 mEq/L 22 22 -  Calcium 8.4 - 10.5 mg/dL 8.7 7.8(L) -   Hepatic Function Latest Ref Rng & Units 07/08/2013 05/09/2013  03/12/2013  Total Protein 6.0 - 8.3 g/dL 6.5 7.4 7.0  Albumin 3.5 - 5.2 g/dL 4.1 4.2 4.6  AST 0 - 37 U/L 16 24 17   ALT 0 - 53 U/L 21 33 18  Alk Phosphatase 39 - 117 U/L 44 43 39  Total Bilirubin 0.2 - 1.2 mg/dL 0.4 0.5 0.7   CBC Latest Ref Rng & Units 08/19/2013 07/08/2013 05/17/2013  WBC 4.0 - 10.5 K/uL 6.8 6.8 6.7  Hemoglobin 13.0 - 17.0 g/dL 13.4 12.3(L) 9.4(L)  Hematocrit 39.0 - 52.0 % 39.3 36.7(L) 27.3(L)  Platelets 150 - 400 K/uL 170 166 84(L)   Lab Results  Component Value Date   MCV 92.0 08/19/2013   MCV 95.1 07/08/2013   MCV 96.8 05/17/2013   Lab Results  Component Value Date   TSH 1.554 07/08/2013   Lab Results  Component Value Date   HGBA1C 5.5 05/09/2013   Lipid Panel     Component Value Date/Time   CHOL 122 07/08/2013 0856   TRIG 89 07/08/2013 0856   HDL 42 07/08/2013 0856   CHOLHDL 2.9 07/08/2013 0856   VLDL 18 07/08/2013 0856   LDLCALC 62 07/08/2013 0856     RADIOLOGY: No results found.  IMPRESSION:  1. Essential hypertension   2. Severe aortic stenosis   3. S/P aortic valve replacement   4. Pure hypercholesterolemia   5. GERD without esophagitis   6. Ankle edema     ASSESSMENT AND PLAN: Mr. Veverly Fells is a 71 year old active white male who developed progressive severe aortic valve stenosis and underwent successful bovine pericardial tissue valve replacement on 05/13/2013.  He has a history of hypertension.  The past year, he has continued to be stable and specifically is without chest pain, shortness of breath, PND, orthopnea, or palpitations.  He exercises every day and walks for at least 30 minutes or right his bicycle.  I have out the necessary information for him to participate in the maintenance  phase of cardiac rehabilitation.  Over the winter months.  Pressure today is elevated despite taking lisinopril 10 mg daily and Toprol-XL 50 mg.  I have recommended further titration of lisinopril to 15 mg.  He continues to take Vytorin 10/40, and  laboratory has been checked by Dr.Tisovic.  He underwent bilateral cataract extraction several months ago and tolerated this well. With  increased lisinopril he will undergo a BMET in 2 weeks.  We will repeat an echo Doppler study prior to his next office visit to reassess his aortic valve.  He will monitor his blood pressure in cardiac rehabilitation and notify us if his blood pressure remains elevated.  I will see him in 4 months for reevaluation  Time spent: 25 minutes  [name], MD, [facility]  03/02/2017 9:35 PM

## 2017-03-01 NOTE — Patient Instructions (Addendum)
Medication Instructions:  Your physician has recommended you make the following change in your medication:  1) INCREASE Lisinopril to 15 mg (one and half tablets) by mouth ONCE daily   Labwork: Your physician recommends that you return for lab work in: 2 weeks (BMET) - week of November 12th   Testing/Procedures: Your physician has requested that you have an echocardiogram. Echocardiography is a painless test that uses sound waves to create images of your heart. It provides your doctor with information about the size and shape of your heart and how well your heart's chambers and valves are working. This procedure takes approximately one hour. There are no restrictions for this procedure. SCHEDULE FOR April 2019    Follow-Up: Your physician recommends that you schedule a follow-up appointment in: 4 months with Dr. Tresa EndoKelly.   Any Other Special Instructions Will Be Listed Below (If Applicable).     If you need a refill on your cardiac medications before your next appointment, please call your pharmacy.

## 2017-03-14 ENCOUNTER — Ambulatory Visit: Payer: Medicare Other | Admitting: Cardiovascular Disease

## 2017-03-20 LAB — BASIC METABOLIC PANEL
BUN/Creatinine Ratio: 19 (ref 10–24)
BUN: 23 mg/dL (ref 8–27)
CALCIUM: 9.4 mg/dL (ref 8.6–10.2)
CO2: 20 mmol/L (ref 20–29)
CREATININE: 1.22 mg/dL (ref 0.76–1.27)
Chloride: 105 mmol/L (ref 96–106)
GFR calc Af Amer: 68 mL/min/{1.73_m2} (ref >59)
GFR, EST NON AFRICAN AMERICAN: 59 mL/min/{1.73_m2} — AB (ref >59)
GLUCOSE: 97 mg/dL (ref 65–99)
Potassium: 4.8 mmol/L (ref 3.5–5.2)
Sodium: 142 mmol/L (ref 134–144)

## 2017-04-03 ENCOUNTER — Encounter (HOSPITAL_COMMUNITY)
Admission: RE | Admit: 2017-04-03 | Discharge: 2017-04-03 | Disposition: A | Discharge status: Home / Self Care | Payer: Self-pay | Source: Ambulatory Visit | Attending: Cardiovascular Disease | Admitting: Cardiovascular Disease

## 2017-04-03 DIAGNOSIS — Z952 Presence of prosthetic heart valve: Secondary | ICD-10-CM | POA: Insufficient documentation

## 2017-04-05 ENCOUNTER — Encounter (HOSPITAL_COMMUNITY)
Admission: RE | Admit: 2017-04-05 | Discharge: 2017-04-05 | Disposition: A | Discharge status: Home / Self Care | Payer: Self-pay | Source: Ambulatory Visit | Attending: Cardiovascular Disease | Admitting: Cardiovascular Disease

## 2017-04-07 ENCOUNTER — Encounter (HOSPITAL_COMMUNITY)
Admission: RE | Admit: 2017-04-07 | Discharge: 2017-04-07 | Disposition: A | Discharge status: Home / Self Care | Payer: Self-pay | Source: Ambulatory Visit | Attending: Cardiovascular Disease | Admitting: Cardiovascular Disease

## 2017-04-10 ENCOUNTER — Encounter (HOSPITAL_COMMUNITY): Payer: Self-pay

## 2017-04-12 ENCOUNTER — Encounter (HOSPITAL_COMMUNITY)
Admission: RE | Admit: 2017-04-12 | Discharge: 2017-04-12 | Disposition: A | Discharge status: Home / Self Care | Payer: Self-pay | Source: Ambulatory Visit | Attending: Cardiovascular Disease | Admitting: Cardiovascular Disease

## 2017-04-14 ENCOUNTER — Encounter (HOSPITAL_COMMUNITY)
Admission: RE | Admit: 2017-04-14 | Discharge: 2017-04-14 | Disposition: A | Discharge status: Home / Self Care | Payer: Self-pay | Source: Ambulatory Visit | Attending: Cardiovascular Disease | Admitting: Cardiovascular Disease

## 2017-04-17 ENCOUNTER — Encounter (HOSPITAL_COMMUNITY)
Admission: RE | Admit: 2017-04-17 | Discharge: 2017-04-17 | Disposition: A | Discharge status: Home / Self Care | Payer: Self-pay | Source: Ambulatory Visit | Attending: Cardiovascular Disease | Admitting: Cardiovascular Disease

## 2017-04-19 ENCOUNTER — Encounter (HOSPITAL_COMMUNITY)
Admission: RE | Admit: 2017-04-19 | Discharge: 2017-04-19 | Disposition: A | Discharge status: Home / Self Care | Payer: Self-pay | Source: Ambulatory Visit | Attending: Cardiovascular Disease | Admitting: Cardiovascular Disease

## 2017-04-21 ENCOUNTER — Encounter (HOSPITAL_COMMUNITY)
Admission: RE | Admit: 2017-04-21 | Discharge: 2017-04-21 | Disposition: A | Discharge status: Home / Self Care | Payer: Self-pay | Source: Ambulatory Visit | Attending: Cardiovascular Disease | Admitting: Cardiovascular Disease

## 2017-04-26 ENCOUNTER — Encounter (HOSPITAL_COMMUNITY)
Admission: RE | Admit: 2017-04-26 | Discharge: 2017-04-26 | Disposition: A | Discharge status: Home / Self Care | Payer: Self-pay | Source: Ambulatory Visit | Attending: Cardiovascular Disease | Admitting: Cardiovascular Disease

## 2017-04-28 ENCOUNTER — Encounter (HOSPITAL_COMMUNITY)
Admission: RE | Admit: 2017-04-28 | Discharge: 2017-04-28 | Disposition: A | Discharge status: Home / Self Care | Payer: Self-pay | Source: Ambulatory Visit | Attending: Cardiovascular Disease | Admitting: Cardiovascular Disease

## 2017-05-01 ENCOUNTER — Encounter (HOSPITAL_COMMUNITY)
Admission: RE | Admit: 2017-05-01 | Discharge: 2017-05-01 | Disposition: A | Discharge status: Home / Self Care | Payer: Self-pay | Source: Ambulatory Visit | Attending: Cardiovascular Disease | Admitting: Cardiovascular Disease

## 2017-05-03 ENCOUNTER — Encounter (HOSPITAL_COMMUNITY)
Admission: RE | Admit: 2017-05-03 | Discharge: 2017-05-03 | Disposition: A | Discharge status: Home / Self Care | Payer: Self-pay | Source: Ambulatory Visit | Attending: Cardiovascular Disease | Admitting: Cardiovascular Disease

## 2017-05-03 ENCOUNTER — Other Ambulatory Visit: Payer: Self-pay | Admitting: Cardiovascular Disease

## 2017-05-03 DIAGNOSIS — Z952 Presence of prosthetic heart valve: Secondary | ICD-10-CM | POA: Insufficient documentation

## 2017-05-03 NOTE — Telephone Encounter (Signed)
Rx has been sent to the pharmacy electronically. ° °

## 2017-05-05 ENCOUNTER — Encounter (HOSPITAL_COMMUNITY)
Admission: RE | Admit: 2017-05-05 | Discharge: 2017-05-05 | Disposition: A | Discharge status: Home / Self Care | Payer: Self-pay | Source: Ambulatory Visit | Attending: Cardiovascular Disease | Admitting: Cardiovascular Disease

## 2017-05-08 ENCOUNTER — Encounter (HOSPITAL_COMMUNITY): Payer: Self-pay

## 2017-05-08 DIAGNOSIS — Z961 Presence of intraocular lens: Secondary | ICD-10-CM | POA: Diagnosis not present

## 2017-05-10 ENCOUNTER — Encounter (HOSPITAL_COMMUNITY)
Admission: RE | Admit: 2017-05-10 | Discharge: 2017-05-10 | Disposition: A | Discharge status: Home / Self Care | Payer: Self-pay | Source: Ambulatory Visit | Attending: Cardiovascular Disease | Admitting: Cardiovascular Disease

## 2017-05-12 ENCOUNTER — Encounter (HOSPITAL_COMMUNITY)
Admission: RE | Admit: 2017-05-12 | Discharge: 2017-05-12 | Disposition: A | Discharge status: Home / Self Care | Payer: Medicare Other | Source: Ambulatory Visit | Attending: Cardiovascular Disease | Admitting: Cardiovascular Disease

## 2017-05-15 ENCOUNTER — Encounter (HOSPITAL_COMMUNITY)
Admission: RE | Admit: 2017-05-15 | Discharge: 2017-05-15 | Disposition: A | Discharge status: Home / Self Care | Payer: Self-pay | Source: Ambulatory Visit | Attending: Cardiovascular Disease | Admitting: Cardiovascular Disease

## 2017-05-17 ENCOUNTER — Encounter (HOSPITAL_COMMUNITY)
Admission: RE | Admit: 2017-05-17 | Discharge: 2017-05-17 | Disposition: A | Discharge status: Home / Self Care | Payer: Self-pay | Source: Ambulatory Visit | Attending: Cardiovascular Disease | Admitting: Cardiovascular Disease

## 2017-05-19 ENCOUNTER — Encounter (HOSPITAL_COMMUNITY)
Admission: RE | Admit: 2017-05-19 | Discharge: 2017-05-19 | Disposition: A | Discharge status: Home / Self Care | Payer: Self-pay | Source: Ambulatory Visit | Attending: Cardiovascular Disease | Admitting: Cardiovascular Disease

## 2017-05-22 ENCOUNTER — Encounter (HOSPITAL_COMMUNITY)
Admission: RE | Admit: 2017-05-22 | Discharge: 2017-05-22 | Disposition: A | Discharge status: Home / Self Care | Payer: Medicare Other | Source: Ambulatory Visit | Attending: Cardiovascular Disease | Admitting: Cardiovascular Disease

## 2017-05-24 ENCOUNTER — Encounter (HOSPITAL_COMMUNITY)
Admission: RE | Admit: 2017-05-24 | Discharge: 2017-05-24 | Disposition: A | Discharge status: Home / Self Care | Payer: Self-pay | Source: Ambulatory Visit | Attending: Cardiovascular Disease | Admitting: Cardiovascular Disease

## 2017-05-26 ENCOUNTER — Encounter (HOSPITAL_COMMUNITY)
Admission: RE | Admit: 2017-05-26 | Discharge: 2017-05-26 | Disposition: A | Discharge status: Home / Self Care | Payer: Self-pay | Source: Ambulatory Visit | Attending: Cardiovascular Disease | Admitting: Cardiovascular Disease

## 2017-05-29 ENCOUNTER — Encounter (HOSPITAL_COMMUNITY)
Admission: RE | Admit: 2017-05-29 | Discharge: 2017-05-29 | Disposition: A | Discharge status: Home / Self Care | Payer: Self-pay | Source: Ambulatory Visit | Attending: Cardiovascular Disease | Admitting: Cardiovascular Disease

## 2017-05-31 ENCOUNTER — Encounter (HOSPITAL_COMMUNITY)
Admission: RE | Admit: 2017-05-31 | Discharge: 2017-05-31 | Disposition: A | Discharge status: Home / Self Care | Payer: Self-pay | Source: Ambulatory Visit | Attending: Cardiovascular Disease | Admitting: Cardiovascular Disease

## 2017-06-02 ENCOUNTER — Encounter (HOSPITAL_COMMUNITY)
Admission: RE | Admit: 2017-06-02 | Discharge: 2017-06-02 | Disposition: A | Discharge status: Home / Self Care | Payer: Self-pay | Source: Ambulatory Visit | Attending: Cardiovascular Disease | Admitting: Cardiovascular Disease

## 2017-06-02 DIAGNOSIS — Z952 Presence of prosthetic heart valve: Secondary | ICD-10-CM | POA: Insufficient documentation

## 2017-06-05 ENCOUNTER — Encounter (HOSPITAL_COMMUNITY)
Admission: RE | Admit: 2017-06-05 | Discharge: 2017-06-05 | Disposition: A | Discharge status: Home / Self Care | Payer: Self-pay | Source: Ambulatory Visit | Attending: Cardiovascular Disease | Admitting: Cardiovascular Disease

## 2017-06-07 ENCOUNTER — Encounter (HOSPITAL_COMMUNITY)
Admission: RE | Admit: 2017-06-07 | Discharge: 2017-06-07 | Disposition: A | Discharge status: Home / Self Care | Payer: Self-pay | Source: Ambulatory Visit | Attending: Cardiovascular Disease | Admitting: Cardiovascular Disease

## 2017-06-09 ENCOUNTER — Encounter (HOSPITAL_COMMUNITY)
Admission: RE | Admit: 2017-06-09 | Discharge: 2017-06-09 | Disposition: A | Discharge status: Home / Self Care | Payer: Self-pay | Source: Ambulatory Visit | Attending: Cardiovascular Disease | Admitting: Cardiovascular Disease

## 2017-06-12 ENCOUNTER — Encounter (HOSPITAL_COMMUNITY)
Admission: RE | Admit: 2017-06-12 | Discharge: 2017-06-12 | Disposition: A | Discharge status: Home / Self Care | Payer: Medicare Other | Source: Ambulatory Visit | Attending: Cardiovascular Disease | Admitting: Cardiovascular Disease

## 2017-06-12 ENCOUNTER — Other Ambulatory Visit: Payer: Self-pay | Admitting: Cardiovascular Disease

## 2017-06-12 NOTE — Telephone Encounter (Signed)
REFILL 

## 2017-06-14 ENCOUNTER — Encounter (HOSPITAL_COMMUNITY)
Admission: RE | Admit: 2017-06-14 | Discharge: 2017-06-14 | Disposition: A | Discharge status: Home / Self Care | Payer: Self-pay | Source: Ambulatory Visit | Attending: Cardiovascular Disease | Admitting: Cardiovascular Disease

## 2017-06-16 ENCOUNTER — Encounter (HOSPITAL_COMMUNITY)
Admission: RE | Admit: 2017-06-16 | Discharge: 2017-06-16 | Disposition: A | Discharge status: Home / Self Care | Payer: Self-pay | Source: Ambulatory Visit | Attending: Cardiovascular Disease | Admitting: Cardiovascular Disease

## 2017-06-19 ENCOUNTER — Encounter (HOSPITAL_COMMUNITY)
Admission: RE | Admit: 2017-06-19 | Discharge: 2017-06-19 | Disposition: A | Discharge status: Home / Self Care | Payer: Self-pay | Source: Ambulatory Visit | Attending: Cardiovascular Disease | Admitting: Cardiovascular Disease

## 2017-06-21 ENCOUNTER — Encounter (HOSPITAL_COMMUNITY)
Admission: RE | Admit: 2017-06-21 | Discharge: 2017-06-21 | Disposition: A | Discharge status: Home / Self Care | Payer: Self-pay | Source: Ambulatory Visit | Attending: Cardiovascular Disease | Admitting: Cardiovascular Disease

## 2017-06-23 ENCOUNTER — Encounter (HOSPITAL_COMMUNITY)
Admission: RE | Admit: 2017-06-23 | Discharge: 2017-06-23 | Disposition: A | Discharge status: Home / Self Care | Payer: Self-pay | Source: Ambulatory Visit | Attending: Cardiovascular Disease | Admitting: Cardiovascular Disease

## 2017-06-26 ENCOUNTER — Encounter (HOSPITAL_COMMUNITY)
Admission: RE | Admit: 2017-06-26 | Discharge: 2017-06-26 | Disposition: A | Discharge status: Home / Self Care | Payer: Self-pay | Source: Ambulatory Visit | Attending: Cardiovascular Disease | Admitting: Cardiovascular Disease

## 2017-06-28 ENCOUNTER — Encounter (HOSPITAL_COMMUNITY)
Admission: RE | Admit: 2017-06-28 | Discharge: 2017-06-28 | Disposition: A | Discharge status: Home / Self Care | Payer: Self-pay | Source: Ambulatory Visit | Attending: Cardiovascular Disease | Admitting: Cardiovascular Disease

## 2017-06-30 ENCOUNTER — Ambulatory Visit: Payer: Medicare HMO | Admitting: Cardiovascular Disease

## 2017-06-30 ENCOUNTER — Encounter: Payer: Self-pay | Admitting: Cardiovascular Disease

## 2017-06-30 VITALS — BP 164/92 | HR 63 | Ht 69.5 in | Wt 191.0 lb

## 2017-06-30 DIAGNOSIS — I10 Essential (primary) hypertension: Secondary | ICD-10-CM | POA: Diagnosis not present

## 2017-06-30 DIAGNOSIS — Z952 Presence of prosthetic heart valve: Secondary | ICD-10-CM

## 2017-06-30 DIAGNOSIS — E7800 Pure hypercholesterolemia, unspecified: Secondary | ICD-10-CM | POA: Diagnosis not present

## 2017-06-30 DIAGNOSIS — I350 Nonrheumatic aortic (valve) stenosis: Secondary | ICD-10-CM | POA: Diagnosis not present

## 2017-06-30 DIAGNOSIS — K219 Gastro-esophageal reflux disease without esophagitis: Secondary | ICD-10-CM | POA: Diagnosis not present

## 2017-06-30 MED ORDER — EZETIMIBE-SIMVASTATIN 10-40 MG PO TABS: 1.0000 | ORAL_TABLET | Freq: Every day | ORAL | 3 refills | Status: DC

## 2017-06-30 MED ORDER — METOPROLOL SUCCINATE ER 50 MG PO TB24: ORAL_TABLET | ORAL | 3 refills | Status: DC

## 2017-06-30 MED ORDER — LISINOPRIL 20 MG PO TABS: 20.0000 mg | ORAL_TABLET | Freq: Every day | ORAL | 3 refills | Status: DC

## 2017-06-30 NOTE — Progress Notes (Signed)
Patient ID: Drew Andrews, male   DOB: 01-Feb-1946, 72 y.o.   MRN: [id number]     HPI: Drew Andrews is a 72 y.o. male who presents for a 4 month follow-up cardiology evaluation.  [name] was first told of having a heart murmur back in the 1980s when he was planning to work at  [employer] and had a physical exam done at that time. I have  seen him since 2005 when he was referred to me by [name] for cardiac murmur. Since 2005 he had undergone follow-up  echo Doppler studies to assess his aortic valve. In 2005 his aortic transvalvular gradient was 37 with a mean gradient of 18. Over the last 9 years, his aortic valve murmur gradually become more significant and he remained asymptomatic.  An echo in 11/03/2014showed normal LV function with  grade 2 diastolic dysfunction. His aortic valve again was moderately calcified. The mean gradient had increased to 47 and peak instantaneous gradient 86 giving a calculated aortic valve area of 0.63 cm. He did have mitral annular calcification. His left atrium was moderately dilated. He did have very mild RV dilatation.  When I saw him in November 2014 [name] was continuing to exercise but there was new development of exertional shortness of breath. At that time, I strongly recommended cardiac catheterization and he underwent right and left heart cardiac catheterization on 03/15/2013. This confirmed severe aortic valve stenosis mild pulmonary hypertension. He had mild chronic calcification but nonobstructive 20% narrowing in the LAD.   He underwent successful aortic valve replacement surgery by Dr. [name] 05/13/2013 and had a 25 mm [facility] Ease bovine pericardial valve inserted. Socially, he has done remarkably well following his valve replacement surgery. He denies any episodes of palpitations. He has resumed activity.  Since his valve replacement, he has continues to feel well.  He denies any shortness of breath  or chest pain.  He does exercise regularly.  Typically in the cold winter months.  He likes to exercise at cardiac rehabilitation for the months of January and February..  However, he has had several rare occurrences of diplopia it are short lived.  He has been taking aspirin 81 mg daily.  A repeat echo Doppler study on 12/23/2013.revealed an EF 55-60% with mild LVH.  His aortic prosthesis was well-seated and open well.  There was only trivial aortic insufficiency.  He did have mild dilatation of his left atrium and mitral annular calcification.  There also was some dilatation of his RV.    His last echo in October 2016 continued to show excellent LV function with an EF of 55-60% and he had normal diastolic parameters.  The bioprosthetic AV was  functioning normally.  The gradients were felt to be very minimally elevated and unchanged from his previous study with a mean gradient of 11 and peak gradient of 23 mm.  There was no evidence for aortic insufficiency.  There was evidence for mitral annular calcification and mild LA dilatation.  He underwent a 2-D echo Doppler study on 08/02/2016.  He had normal LV function with EF of 60-65% and grade 2 diastolic dysfunction.  His aortic bioprosthesis was well-seated and was functioning well.  The mean gradient was 11 and peak gradient 20.  He had mild MR.  There was mild TR and mild LA dilatation.    He participate the maintenance phase of cardiac rehabilitation over the winter months and will be ending this week.  Typically over the  summer he rides his bike.  On days he does not do cardiac rehabilitation in the winter he often walks and if the weather is nice.  He may ride his bike occasionally.  He typically gained some weight over the winter months but loses weight over the summer when he is more active outside.  He denies any chest pain, presyncope or syncope, palpitations, or change in exercise tolerance.  Recently, he has noticed his blood pressure being  slightly elevated in the 130s to 140s when he arrives at cardiac rehabilitation after exercise his blood pressure is normal.  He has increased his lisinopril to 7.5 mg.   I last saw him in October 2018, he denied any episodes of chest pain or shortness of breath.  He was exercising and walks for at least 30 minutes or bikes 7 days per week.  Over the winter months, he again participated in the cardiac rehabilitation program.  When I last saw him, his blood pressure was mildly elevated and I titrated lisinopril to 15 mg daily.  He continues to take metoprolol succinate 50 mg.  He has been on generic Vytorin 10/40 for hyperlipidemia.  He has not had his follow-up echo Doppler study, yet since his last echo was in April 2018.  He presents for reevaluation.  Past Medical History:  Diagnosis Date  . GERD (gastroesophageal reflux disease)   . Hyperlipidemia   . Hypertension   . LVH (left ventricular hypertrophy)    with aortic stenosis-bicuspid  . PONV (postoperative nausea and vomiting)    as a child  . S/P AVR (aortic valve replacement) 05/13/2013  . Seasonal allergies     Past Surgical History:  Procedure Laterality Date  . AORTIC VALVE REPLACEMENT N/A 05/13/2013   Procedure: AORTIC VALVE REPLACEMENT (AVR);  Surgeon: [name], MD;  Location: [place];  Service: Open Heart Surgery;  Laterality: N/A;  . APPENDECTOMY  age 89  . CARDIAC CATHETERIZATION    . COLONOSCOPY     X 2  . HIATAL HERNIA REPAIR    . INTRAOPERATIVE TRANSESOPHAGEAL ECHOCARDIOGRAM N/A 05/13/2013   Procedure: INTRAOPERATIVE TRANSESOPHAGEAL ECHOCARDIOGRAM;  Surgeon: [name], MD;  Location: [facility] OR;  Service: Open Heart Surgery;  Laterality: N/A;  . LEFT HEART CATHETERIZATION WITH CORONARY ANGIOGRAM N/A 03/15/2013   Procedure: LEFT HEART CATHETERIZATION WITH CORONARY ANGIOGRAM;  Surgeon: [name], MD;  Location: [facility] C[facility] LAB;  Service: Cardiovascular;  Laterality: N/A;  . TONSILLECTOMY    . TRANSTHORACIC  ECHOCARDIOGRAM  03/04/2013   EF 19-41%, grade 2 diastolic dysfunction, AV with mod calcified leaflets & mild regurg, calcified MV annulus, LA mod dilated, RV mildly dilated    Allergies  Allergen Reactions  . Scopolamine     seizure    Current Outpatient Medications  Medication Sig Dispense Refill  . amoxicillin (AMOXIL) 500 MG tablet Take 4 tablets 1 hour prior to dental procedure 4 tablet 11  . aspirin 81 MG tablet Take 81 mg by mouth daily.    [name] ezetimibe-simvastatin (VYTORIN) 10-40 MG tablet Take 1 tablet by mouth at bedtime. 90 tablet 3  . Ibuprofen-Diphenhydramine Cit (ADVIL PM PO) Take 2-3 tablets by mouth at bedtime as needed (for sleep).     [name] lisinopril (PRINIVIL,ZESTRIL) 20 MG tablet Take 1 tablet (20 mg total) by mouth daily. 90 tablet 3  . meloxicam (MOBIC) 15 MG tablet Take 1 tablet by mouth as needed for pain.     . metoprolol succinate (TOPROL-XL) 50 MG 24 hr  tablet TAKE 1 TABLET BY MOUTH  DAILY WITH OR IMMEDIATLEY  FOLLOWING A MEAL 90 tablet 3  . omeprazole (PRILOSEC OTC) 20 MG tablet Take 20 mg by mouth daily.      No current facility-administered medications for this visit.     Social History   Socioeconomic History  . Marital status: Married    Spouse name: Not on file  . Number of children: 1  . Years of education: college  . Highest education level: Not on file  Social Needs  . Financial resource strain: Not on file  . Food insecurity - worry: Not on file  . Food insecurity - inability: Not on file  . Transportation needs - medical: Not on file  . Transportation needs - non-medical: Not on file  Occupational History  . Not on file  Tobacco Use  . Smoking status: Former Smoker    Packs/day: 1.00    Last attempt to quit: 02/10/1974    Years since quitting: 43.4  . Smokeless tobacco: Never Used  Substance and Sexual Activity  . Alcohol use: Yes    Comment: Drinks 1 bottle of wine daily and 1 beer  . Drug use: No  . Sexual activity: Not on file    Other Topics Concern  . Not on file  Social History Narrative  . Not on file   Additional social history is notable in that he has a Paediatric nurse in IT sales professional. He is retired from [employer]. He quit tobacco in 1979. He continues to exercise 7 days per week.  Family History  Problem Relation Age of Onset  . Hypertension Mother   . Hyperlipidemia Mother   . Diabetes Mother   . Heart attack Father   . Acute myelogenous leukemia Brother   . Pneumonia Maternal Grandmother   . Heart attack Maternal Grandfather   . Colon cancer Neg Hx   . Stomach cancer Neg Hx    ROS General: Negative; No fevers, chills, or night sweats;  HEENT: Negative; No changes in vision or hearing, sinus congestion, difficulty swallowing Pulmonary: Negative; No cough, wheezing, shortness of breath, hemoptysis Cardiovascular: Negative; No chest pain, presyncope, syncope, palpitations GI: Negative; No nausea, vomiting, diarrhea, or abdominal pain GU: Negative; No dysuria, hematuria, or difficulty voiding Musculoskeletal: Mild arthritic issues of his elbows and knees; no myalgias, joint pain, or weakness Hematologic/Oncology: Negative; no easy bruising, bleeding Endocrine: Negative; no heat/cold intolerance; no diabetes Neuro: Rare episodes of diplopia; no changes in balance, headaches Skin: Negative; No rashes or skin lesions Psychiatric: Negative; No behavioral problems, depression Sleep: Negative; No snoring, daytime sleepiness, hypersomnolence, bruxism, restless legs, hypnogognic hallucinations, no cataplexy Other comprehensive 14 point system review is negative.   PE BP (!) 164/92   Pulse 63   Ht 5' 9.5" (1.765 m)   Wt 191 lb (86.6 kg)   BMI 27.80 kg/m   Repeat blood pressure by me was 145/88.  I reviewed his blood pressure recordings from cardiac rehabilitation program.  Wt Readings from Last 3 Encounters:  06/30/17 191 lb (86.6 kg)  03/01/17 187 lb 6.4 oz (85 kg)   08/08/16 191 lb (86.6 kg)   General: Alert, oriented, no distress.  Skin: normal turgor, no rashes, warm and dry HEENT: Normocephalic, atraumatic. Pupils equal round and reactive to light; sclera anicteric; extraocular muscles intact;  Nose without nasal septal hypertrophy Mouth/Parynx benign; Mallinpatti scale 3 Neck: No JVD, no carotid bruits; normal carotid upstroke Lungs: clear to ausculatation and percussion; no wheezing  or rales Chest wall: without tenderness to palpitation Heart: PMI not displaced, RRR, s1 s2 normal, 2/6 systolic murmur concordant with his AVR, no diastolic murmur, no rubs, gallops, thrills, or heaves Abdomen: soft, nontender; no hepatosplenomehaly, BS+; abdominal aorta nontender and not dilated by palpation. Back: no CVA tenderness Pulses 2+ Musculoskeletal: full range of motion, normal strength, no joint deformities Extremities: no clubbing cyanosis or edema, Homan's sign negative  Neurologic: grossly nonfocal; Cranial nerves grossly wnl Psychologic: Normal mood and affect  ECG (independently read by me): Sinus rhythm at 63 bpm.  First-degree AV block.  PAC.  Borderline voltage criteria for LVH.  October 2018 ECG (independently read by me): Sinus bradycardia 58 bpm.  First-degree AV block.  LVH by voltage.  April 2018 ECG (independently read by me): Sinus bradycardia at 58 bpm.  First degree AV block Borderline LVH by voltage criteria.  July 2017 ECG (independently read by me): Sinus bradycardia at 53 bpm.  Mild first-degree AV block with a PR interval at 226 ms.  Borderline LVH by voltage criteria.  Mild T wave abnormality in 3 and aVF.  November 2016 ECG (independently read by me): Normal sinus rhythm at 65 bpm.  First-degree AV block with a PR interval at 244 ms.  October 2015 ECG disease independently read by me): Normal sinus rhythm at 57 beats per minute.  Mild first degree AV block.  Prior 07/08/2013 EKG (independently read by the) normal sinus  rhythm. Non-specific ST-T changes inferolaterally.  Prior 03/12/13 ECG: Sinus rhythm at 66 beats per minute. First-degree AV block. LVH by voltage criteria. Mild nondiagnostic T changes.  LABS:  BMP Latest Ref Rng & Units 03/20/2017 07/08/2013 05/15/2013  Glucose 65 - 99 mg/dL 97 97 130(H)  BUN 8 - 27 mg/dL 23 19 13   Creatinine 0.76 - 1.27 mg/dL 1.22 1.02 0.95  BUN/Creat Ratio 10 - 24 19 - -  Sodium 134 - 144 mmol/L 142 139 137  Potassium 3.5 - 5.2 mmol/L 4.8 4.5 4.4  Chloride 96 - 106 mmol/L 105 108 102  CO2 20 - 29 mmol/L 20 22 22   Calcium 8.6 - 10.2 mg/dL 9.4 8.7 7.8(L)   Hepatic Function Latest Ref Rng & Units 07/08/2013 05/09/2013 03/12/2013  Total Protein 6.0 - 8.3 g/dL 6.5 7.4 7.0  Albumin 3.5 - 5.2 g/dL 4.1 4.2 4.6  AST 0 - 37 U/L 16 24 17   ALT 0 - 53 U/L 21 33 18  Alk Phosphatase 39 - 117 U/L 44 43 39  Total Bilirubin 0.2 - 1.2 mg/dL 0.4 0.5 0.7   CBC Latest Ref Rng & Units 08/19/2013 07/08/2013 05/17/2013  WBC 4.0 - 10.5 K/uL 6.8 6.8 6.7  Hemoglobin 13.0 - 17.0 g/dL 13.4 12.3(L) 9.4(L)  Hematocrit 39.0 - 52.0 % 39.3 36.7(L) 27.3(L)  Platelets 150 - 400 K/uL 170 166 84(L)   Lab Results  Component Value Date   MCV 92.0 08/19/2013   MCV 95.1 07/08/2013   MCV 96.8 05/17/2013   Lab Results  Component Value Date   TSH 1.554 07/08/2013   Lab Results  Component Value Date   HGBA1C 5.5 05/09/2013   Lipid Panel     Component Value Date/Time   CHOL 122 07/08/2013 0856   TRIG 89 07/08/2013 0856   HDL 42 07/08/2013 0856   CHOLHDL 2.9 07/08/2013 0856   VLDL 18 07/08/2013 0856   LDLCALC 62 07/08/2013 0856     RADIOLOGY: No results found.  IMPRESSION:  1. Essential hypertension   2. Severe aortic  stenosis   3. S/P aortic valve replacement   4. Pure hypercholesterolemia   5. GERD without esophagitis     ASSESSMENT AND PLAN: Mr. Veverly Fells is a 72 year old active white male who developed progressive severe aortic valve stenosis and underwent successful bovine  pericardial tissue valve replacement on 05/13/2013.  He has a history of hypertension and hyperlipidemia.  He continues to exercise daily and over the winter months, has again participated in the cardiac rehabilitation program, which will be ending.  His last echo Doppler study was in April 2018 and his bile prostatic aortic valve was seated well and had a mean gradient of 11 and peak gradient of 20.  His blood pressure today continues to be mildly elevated and I have recommended further titration of lisinopril up to 20 mg.  He is on Toprol-XL 50 mg daily.  ECG shows sinus rhythm at 63 bpm with mild first-degree AV block.  He continues to be on generic Vytorin  10/40 mg.  Laboratory in August 2018 showed a total cholesterol of 160, LDL 90, HDL 54, and triglyceride 78.  Hemoglobin A1c was excellent at 5.5.  He will be undergoing repeat lab work with his primary physician.  It may be worthwhile to consider changing him to rosuvastatin.  His LDL remains at 90 in attempt to reduce LDL to less than 70.  At present, he is stable cardiovascularly.  Cardiac exam is unchanged.  I have recommended a follow-up office visit in 6 months, and we will change his echo Doppler study to be done prior to that office evaluation.  He continues to be asymptomatic with reference to chest pain, shortness of breath, presyncope or syncope.  He has GERD but this is stable with over-the-counter omeprazole.  Time spent: 25 minutes  [name], MD, [facility]  07/01/2017 4:28 PM

## 2017-06-30 NOTE — Patient Instructions (Signed)
Medication Instructions:  INCREASE lisinopril to 20 mg daily  Testing/Procedures: Your physician has requested that you have an echocardiogram in 6 MONTHS. Echocardiography is a painless test that uses sound waves to create images of your heart. It provides your doctor with information about the size and shape of your heart and how well your heart's chambers and valves are working. This procedure takes approximately one hour. There are no restrictions for this procedure.  [facility]  [facility]   Follow-Up: Your physician wants you to follow-up in: 6 months with Dr. Tresa EndoKelly. You will receive a reminder letter in the mail two months in advance. If you don't receive a letter, please call our office to schedule the follow-up appointment.     If you need a refill on your cardiac medications before your next appointment, please call your pharmacy.

## 2017-07-01 ENCOUNTER — Encounter: Payer: Self-pay | Admitting: Cardiovascular Disease

## 2017-07-07 DIAGNOSIS — K219 Gastro-esophageal reflux disease without esophagitis: Secondary | ICD-10-CM | POA: Diagnosis not present

## 2017-07-07 DIAGNOSIS — I110 Hypertensive heart disease with heart failure: Secondary | ICD-10-CM | POA: Diagnosis not present

## 2017-07-07 DIAGNOSIS — Z791 Long term (current) use of non-steroidal anti-inflammatories (NSAID): Secondary | ICD-10-CM | POA: Diagnosis not present

## 2017-07-07 DIAGNOSIS — E785 Hyperlipidemia, unspecified: Secondary | ICD-10-CM | POA: Diagnosis not present

## 2017-07-07 DIAGNOSIS — R32 Unspecified urinary incontinence: Secondary | ICD-10-CM | POA: Diagnosis not present

## 2017-07-07 DIAGNOSIS — G4700 Insomnia, unspecified: Secondary | ICD-10-CM | POA: Diagnosis not present

## 2017-07-07 DIAGNOSIS — I509 Heart failure, unspecified: Secondary | ICD-10-CM | POA: Diagnosis not present

## 2017-07-07 DIAGNOSIS — R69 Illness, unspecified: Secondary | ICD-10-CM | POA: Diagnosis not present

## 2017-07-07 DIAGNOSIS — H547 Unspecified visual loss: Secondary | ICD-10-CM | POA: Diagnosis not present

## 2017-08-09 ENCOUNTER — Other Ambulatory Visit (HOSPITAL_COMMUNITY): Payer: Medicare Other

## 2017-09-26 DIAGNOSIS — R69 Illness, unspecified: Secondary | ICD-10-CM | POA: Diagnosis not present

## 2017-12-13 ENCOUNTER — Encounter: Payer: Self-pay | Admitting: Cardiovascular Disease

## 2017-12-13 DIAGNOSIS — Z125 Encounter for screening for malignant neoplasm of prostate: Secondary | ICD-10-CM | POA: Diagnosis not present

## 2017-12-13 DIAGNOSIS — R82998 Other abnormal findings in urine: Secondary | ICD-10-CM | POA: Diagnosis not present

## 2017-12-13 DIAGNOSIS — E7800 Pure hypercholesterolemia, unspecified: Secondary | ICD-10-CM | POA: Diagnosis not present

## 2017-12-20 DIAGNOSIS — I517 Cardiomegaly: Secondary | ICD-10-CM | POA: Diagnosis not present

## 2017-12-20 DIAGNOSIS — N183 Chronic kidney disease, stage 3 (moderate): Secondary | ICD-10-CM | POA: Diagnosis not present

## 2017-12-20 DIAGNOSIS — Z125 Encounter for screening for malignant neoplasm of prostate: Secondary | ICD-10-CM | POA: Diagnosis not present

## 2017-12-20 DIAGNOSIS — E7800 Pure hypercholesterolemia, unspecified: Secondary | ICD-10-CM | POA: Diagnosis not present

## 2017-12-20 DIAGNOSIS — Z952 Presence of prosthetic heart valve: Secondary | ICD-10-CM | POA: Diagnosis not present

## 2017-12-20 DIAGNOSIS — Z0000 Encounter for general adult medical examination without abnormal findings: Secondary | ICD-10-CM | POA: Diagnosis not present

## 2017-12-20 DIAGNOSIS — Q231 Congenital insufficiency of aortic valve: Secondary | ICD-10-CM | POA: Diagnosis not present

## 2017-12-20 DIAGNOSIS — N528 Other male erectile dysfunction: Secondary | ICD-10-CM | POA: Diagnosis not present

## 2017-12-20 DIAGNOSIS — D696 Thrombocytopenia, unspecified: Secondary | ICD-10-CM | POA: Diagnosis not present

## 2017-12-20 DIAGNOSIS — I350 Nonrheumatic aortic (valve) stenosis: Secondary | ICD-10-CM | POA: Diagnosis not present

## 2018-01-03 DIAGNOSIS — Z1212 Encounter for screening for malignant neoplasm of rectum: Secondary | ICD-10-CM | POA: Diagnosis not present

## 2018-01-09 ENCOUNTER — Other Ambulatory Visit: Payer: Self-pay

## 2018-01-09 ENCOUNTER — Ambulatory Visit (HOSPITAL_COMMUNITY): Payer: Medicare HMO | Attending: Cardiology

## 2018-01-09 DIAGNOSIS — I340 Nonrheumatic mitral (valve) insufficiency: Secondary | ICD-10-CM | POA: Diagnosis not present

## 2018-01-09 DIAGNOSIS — I10 Essential (primary) hypertension: Secondary | ICD-10-CM | POA: Diagnosis not present

## 2018-01-09 DIAGNOSIS — I350 Nonrheumatic aortic (valve) stenosis: Secondary | ICD-10-CM | POA: Diagnosis not present

## 2018-01-09 DIAGNOSIS — Z952 Presence of prosthetic heart valve: Secondary | ICD-10-CM | POA: Diagnosis not present

## 2018-01-09 DIAGNOSIS — Z87891 Personal history of nicotine dependence: Secondary | ICD-10-CM | POA: Diagnosis not present

## 2018-01-09 DIAGNOSIS — Z9889 Other specified postprocedural states: Secondary | ICD-10-CM | POA: Diagnosis not present

## 2018-01-09 DIAGNOSIS — E785 Hyperlipidemia, unspecified: Secondary | ICD-10-CM | POA: Insufficient documentation

## 2018-02-21 DIAGNOSIS — R69 Illness, unspecified: Secondary | ICD-10-CM | POA: Diagnosis not present

## 2018-04-03 ENCOUNTER — Ambulatory Visit: Payer: Medicare HMO | Admitting: Cardiovascular Disease

## 2018-04-03 ENCOUNTER — Encounter (INDEPENDENT_AMBULATORY_CARE_PROVIDER_SITE_OTHER): Payer: Self-pay

## 2018-04-03 ENCOUNTER — Encounter: Payer: Self-pay | Admitting: Cardiovascular Disease

## 2018-04-03 VITALS — BP 148/89 | HR 59 | Ht 69.5 in | Wt 188.2 lb

## 2018-04-03 DIAGNOSIS — E7800 Pure hypercholesterolemia, unspecified: Secondary | ICD-10-CM

## 2018-04-03 DIAGNOSIS — K219 Gastro-esophageal reflux disease without esophagitis: Secondary | ICD-10-CM | POA: Diagnosis not present

## 2018-04-03 DIAGNOSIS — Z952 Presence of prosthetic heart valve: Secondary | ICD-10-CM

## 2018-04-03 DIAGNOSIS — I10 Essential (primary) hypertension: Secondary | ICD-10-CM | POA: Diagnosis not present

## 2018-04-03 MED ORDER — EZETIMIBE-SIMVASTATIN 10-40 MG PO TABS: 1.0000 | ORAL_TABLET | Freq: Every day | ORAL | 3 refills | Status: DC

## 2018-04-03 MED ORDER — LISINOPRIL 20 MG PO TABS: 20.0000 mg | ORAL_TABLET | Freq: Every day | ORAL | 3 refills | Status: DC

## 2018-04-03 MED ORDER — METOPROLOL SUCCINATE ER 50 MG PO TB24: ORAL_TABLET | ORAL | 3 refills | Status: DC

## 2018-04-03 NOTE — Progress Notes (Signed)
Patient ID: Drew Andrews, male   DOB: [birth year], 72 y.o.   MRN: [id number]     HPI: Drew Andrews is a 72 y.o. male who presents for a 9 month follow-up cardiology evaluation.  Drew Andrews was first told of having a heart murmur back in the 1980s when he was planning to work at  [employer] and had a physical exam done at that time. I have  seen him since 2005 when he was referred to me by [name] for cardiac murmur. Since 2005 he had undergone follow-up  echo Doppler studies to assess his aortic valve. In 2005 his aortic transvalvular gradient was 37 with a mean gradient of 18. Over the last 9 years, his aortic valve murmur gradually become more significant and he remained asymptomatic.  An echo in 11/03/2014showed normal LV function with  grade 2 diastolic dysfunction. His aortic valve again was moderately calcified. The mean gradient had increased to 47 and peak instantaneous gradient 86 giving a calculated aortic valve area of 0.63 cm. He did have mitral annular calcification. His left atrium was moderately dilated. He did have very mild RV dilatation.  When I saw him in November 2014 Drew Andrews was continuing to exercise but there was new development of exertional shortness of breath. At that time, I strongly recommended cardiac catheterization and he underwent right and left heart cardiac catheterization on 03/15/2013. This confirmed severe aortic valve stenosis mild pulmonary hypertension. He had mild chronic calcification but nonobstructive 20% narrowing in the LAD.   He underwent successful aortic valve replacement surgery by Dr. [name] 05/13/2013 and had a 25 mm [facility] Ease bovine pericardial valve inserted. Socially, he has done remarkably well following his valve replacement surgery. He denies any episodes of palpitations. He has resumed activity.  Since his valve replacement, he has continues to feel well.  He denies any shortness of breath  or chest pain.  He does exercise regularly.  Typically in the cold winter months.  He likes to exercise at cardiac rehabilitation for the months of January and February..  However, he has had several rare occurrences of diplopia it are short lived.  He has been taking aspirin 81 mg daily.  A repeat echo Doppler study on 12/23/2013.revealed an EF 55-60% with mild LVH.  His aortic prosthesis was well-seated and open well.  There was only trivial aortic insufficiency.  He did have mild dilatation of his left atrium and mitral annular calcification.  There also was some dilatation of his RV.    His last echo in October 2016 continued to show excellent LV function with an EF of 55-60% and he had normal diastolic parameters.  The bioprosthetic AV was  functioning normally.  The gradients were felt to be very minimally elevated and unchanged from his previous study with a mean gradient of 11 and peak gradient of 23 mm.  There was no evidence for aortic insufficiency.  There was evidence for mitral annular calcification and mild LA dilatation.  He underwent a 2-D echo Doppler study on 08/02/2016.  He had normal LV function with EF of 60-65% and grade 2 diastolic dysfunction.  His aortic bioprosthesis was well-seated and was functioning well.  The mean gradient was 11 and peak gradient 20.  He had mild MR.  There was mild TR and mild LA dilatation.    He participate the maintenance phase of cardiac rehabilitation over the winter months and will be ending this week.  Typically over the  summer he rides his bike.  On days he does not do cardiac rehabilitation in the winter he often walks and if the weather is nice.  He may ride his bike occasionally.  He typically gained some weight over the winter months but loses weight over the summer when he is more active outside.  He denies any chest pain, presyncope or syncope, palpitations, or change in exercise tolerance.  Recently, he has noticed his blood pressure being  slightly elevated in the 130s to 140s when he arrives at cardiac rehabilitation after exercise his blood pressure is normal.  He has increased his lisinopril to 7.5 mg.   When I saw him in October 2018, he denied any episodes of chest pain or shortness of breath.  He was exercising and walks for at least 30 minutes or bikes 7 days per week.  Over the winter months, he again participated in the cardiac rehabilitation program.  His blood pressure was mildly elevated and I titrated lisinopril to 15 mg daily.  He continues to take metoprolol succinate 50 mg.  He has been on generic Vytorin 10/40 for hyperlipidemia.   I last saw him in March 2019.  Since that time he has continued to be stable.  He typically rides his bike or walks on a daily basis during the nice weather months.  In the wintertime, he participates in cardiac rehab.  He would like to reinstitute his cardiac rehab this month of December and through the winter to allow him to continue to exercise as regularly as he had.  He underwent a follow-up echo Doppler study on January 08, 2018.  This showed normal systolic function with grade 2 diastolic dysfunction.  EF was 55 to 60%.  His aortic bioprosthesis was well-seated.  Mean gradient was 12.  There was no AI.  His a sending aorta was mildly dilated.  There was mild MR and mild to moderate biatrial enlargement.  He continues to be asymptomatic and feels well.  He is enjoying retirement.  He presents for evaluation.   Past Medical History:  Diagnosis Date  . GERD (gastroesophageal reflux disease)   . Hyperlipidemia   . Hypertension   . LVH (left ventricular hypertrophy)    with aortic stenosis-bicuspid  . PONV (postoperative nausea and vomiting)    as a child  . S/P AVR (aortic valve replacement) 05/13/2013  . Seasonal allergies     Past Surgical History:  Procedure Laterality Date  . AORTIC VALVE REPLACEMENT N/A 05/13/2013   Procedure: AORTIC VALVE REPLACEMENT (AVR);  Surgeon: [name], MD;  Location: [place];  Service: Open Heart Surgery;  Laterality: N/A;  . APPENDECTOMY  age 29  . CARDIAC CATHETERIZATION    . COLONOSCOPY     X 2  . HIATAL HERNIA REPAIR    . INTRAOPERATIVE TRANSESOPHAGEAL ECHOCARDIOGRAM N/A 05/13/2013   Procedure: INTRAOPERATIVE TRANSESOPHAGEAL ECHOCARDIOGRAM;  Surgeon: [name], MD;  Location: [facility] OR;  Service: Open Heart Surgery;  Laterality: N/A;  . LEFT HEART CATHETERIZATION WITH CORONARY ANGIOGRAM N/A 03/15/2013   Procedure: LEFT HEART CATHETERIZATION WITH CORONARY ANGIOGRAM;  Surgeon: [name], MD;  Location: [facility] CATH LAB;  Service: Cardiovascular;  Laterality: N/A;  . TONSILLECTOMY    . TRANSTHORACIC ECHOCARDIOGRAM  03/04/2013   EF 11-57%, grade 2 diastolic dysfunction, AV with mod calcified leaflets & mild regurg, calcified MV annulus, LA mod dilated, RV mildly dilated    Allergies  Allergen Reactions  . Scopolamine     seizure  Current Outpatient Medications  Medication Sig Dispense Refill  . amoxicillin (AMOXIL) 500 MG tablet Take 4 tablets 1 hour prior to dental procedure 4 tablet 11  . aspirin 81 MG tablet Take 81 mg by mouth daily.    [name] ezetimibe-simvastatin (VYTORIN) 10-40 MG tablet Take 1 tablet by mouth at bedtime. 90 tablet 3  . Ibuprofen-Diphenhydramine Cit (ADVIL PM PO) Take 2-3 tablets by mouth at bedtime as needed (for sleep).     [name] lisinopril (PRINIVIL,ZESTRIL) 20 MG tablet Take 1 tablet (20 mg total) by mouth daily. 90 tablet 3  . meloxicam (MOBIC) 15 MG tablet Take 1 tablet by mouth as needed for pain.     . metoprolol succinate (TOPROL-XL) 50 MG 24 hr tablet TAKE 1 TABLET BY MOUTH  DAILY WITH OR IMMEDIATLEY  FOLLOWING A MEAL 90 tablet 3  . omeprazole (PRILOSEC OTC) 20 MG tablet Take 20 mg by mouth daily.      No current facility-administered medications for this visit.     Social History   Socioeconomic History  . Marital status: Married    Spouse name: Not on file  . Number of children: 1  . Years  of education: college  . Highest education level: Not on file  Occupational History  . Not on file  Social Needs  . Financial resource strain: Not on file  . Food insecurity:    Worry: Not on file    Inability: Not on file  . Transportation needs:    Medical: Not on file    Non-medical: Not on file  Tobacco Use  . Smoking status: Former Smoker    Packs/day: 1.00    Last attempt to quit: 02/10/1974    Years since quitting: 44.1  . Smokeless tobacco: Never Used  Substance and Sexual Activity  . Alcohol use: Yes    Comment: Drinks 1 bottle of wine daily and 1 beer  . Drug use: No  . Sexual activity: Not on file  Lifestyle  . Physical activity:    Days per week: Not on file    Minutes per session: Not on file  . Stress: Not on file  Relationships  . Social connections:    Talks on phone: Not on file    Gets together: Not on file    Attends religious service: Not on file    Active member of club or organization: Not on file    Attends meetings of clubs or organizations: Not on file    Relationship status: Not on file  . Intimate partner violence:    Fear of current or ex partner: Not on file    Emotionally abused: Not on file    Physically abused: Not on file    Forced sexual activity: Not on file  Other Topics Concern  . Not on file  Social History Narrative  . Not on file   Additional social history is notable in that he has a Paediatric nurse in IT sales professional. He is retired from [employer]. He quit tobacco in 1979. He continues to exercise 7 days per week.  Family History  Problem Relation Age of Onset  . Hypertension Mother   . Hyperlipidemia Mother   . Diabetes Mother   . Heart attack Father   . Acute myelogenous leukemia Brother   . Pneumonia Maternal Grandmother   . Heart attack Maternal Grandfather   . Colon cancer Neg Hx   . Stomach cancer Neg Hx    ROS General: Negative; No  fevers, chills, or night sweats;  HEENT: Negative; No  changes in vision or hearing, sinus congestion, difficulty swallowing Pulmonary: Negative; No cough, wheezing, shortness of breath, hemoptysis Cardiovascular: Negative; No chest pain, presyncope, syncope, palpitations GI: Negative; No nausea, vomiting, diarrhea, or abdominal pain GU: Negative; No dysuria, hematuria, or difficulty voiding Musculoskeletal: Mild arthritic issues of his elbows and knees; no myalgias, joint pain, or weakness Hematologic/Oncology: Negative; no easy bruising, bleeding Endocrine: Negative; no heat/cold intolerance; no diabetes Neuro: Rare episodes of diplopia; no changes in balance, headaches Skin: Negative; No rashes or skin lesions Psychiatric: Negative; No behavioral problems, depression Sleep: Negative; No snoring, daytime sleepiness, hypersomnolence, bruxism, restless legs, hypnogognic hallucinations, no cataplexy Other comprehensive 14 point system review is negative.   PE BP (!) 148/89   Pulse (!) 59   Ht 5' 9.5" (1.765 m)   Wt 188 lb 3.2 oz (85.4 kg)   BMI 27.39 kg/m   Repeat blood pressure by me was 130/70  Wt Readings from Last 3 Encounters:  04/03/18 188 lb 3.2 oz (85.4 kg)  06/30/17 191 lb (86.6 kg)  03/01/17 187 lb 6.4 oz (85 kg)    General: Alert, oriented, no distress.  Skin: normal turgor, no rashes, warm and dry HEENT: Normocephalic, atraumatic. Pupils equal round and reactive to light; sclera anicteric; extraocular muscles intact;  Nose without nasal septal hypertrophy Mouth/Parynx benign; Mallinpatti scale Neck: No JVD, no carotid bruits; normal carotid upstroke Lungs: clear to ausculatation and percussion; no wheezing or rales Chest wall: without tenderness to palpitation Heart: PMI not displaced, RRR, s1 s2 normal, 2/6 systolic murmur in the aortic area, no diastolic murmur, no rubs, gallops, thrills, or heaves Abdomen: soft, nontender; no hepatosplenomehaly, BS+; abdominal aorta nontender and not dilated by palpation. Back: no  CVA tenderness Pulses 2+ Musculoskeletal: full range of motion, normal strength, no joint deformities Extremities: no clubbing cyanosis or edema, Homan's sign negative  Neurologic: grossly nonfocal; Cranial nerves grossly wnl Psychologic: Normal mood and affect   ECG (independently read by me): Sinus bradycardia 59 bpm with first-degree AV block.  PR interval 278 ms.  LVH by voltage criteria.  March 2019 ECG (independently read by me): Sinus rhythm at 63 bpm.  First-degree AV block.  PAC.  Borderline voltage criteria for LVH.  October 2018 ECG (independently read by me): Sinus bradycardia 58 bpm.  First-degree AV block.  LVH by voltage.  April 2018 ECG (independently read by me): Sinus bradycardia at 58 bpm.  First degree AV block Borderline LVH by voltage criteria.  July 2017 ECG (independently read by me): Sinus bradycardia at 53 bpm.  Mild first-degree AV block with a PR interval at 226 ms.  Borderline LVH by voltage criteria.  Mild T wave abnormality in 3 and aVF.  November 2016 ECG (independently read by me): Normal sinus rhythm at 65 bpm.  First-degree AV block with a PR interval at 244 ms.  October 2015 ECG disease independently read by me): Normal sinus rhythm at 57 beats per minute.  Mild first degree AV block.  Prior 07/08/2013 EKG (independently read by the) normal sinus rhythm. Non-specific ST-T changes inferolaterally.  Prior 03/12/13 ECG: Sinus rhythm at 66 beats per minute. First-degree AV block. LVH by voltage criteria. Mild nondiagnostic T changes.  LABS:  BMP Latest Ref Rng & Units 03/20/2017 07/08/2013 05/15/2013  Glucose 65 - 99 mg/dL 97 97 130(H)  BUN 8 - 27 mg/dL _0 Creatinine 0.76 - 1.27 mg/dL 1.22 1.02 0.95  BUN/Creat Ratio  10 - 24 19 - -  Sodium 134 - 144 mmol/L 142 139 137  Potassium 3.5 - 5.2 mmol/L 4.8 4.5 4.4  Chloride 96 - 106 mmol/L 105 108 102  CO2 20 - 29 mmol/L _0 Calcium 8.6 - 10.2 mg/dL 9.4 8.7 7.8(L)   Hepatic Function Latest Ref  Rng & Units 07/08/2013 05/09/2013 03/12/2013  Total Protein 6.0 - 8.3 g/dL 6.5 7.4 7.0  Albumin 3.5 - 5.2 g/dL 4.1 4.2 4.6  AST 0 - 37 U/L _1 ALT 0 - 53 U/L 21 33 18  Alk Phosphatase 39 - 117 U/L 44 43 39  Total Bilirubin 0.2 - 1.2 mg/dL 0.4 0.5 0.7   CBC Latest Ref Rng & Units 08/19/2013 07/08/2013 05/17/2013  WBC 4.0 - 10.5 K/uL 6.8 6.8 6.7  Hemoglobin 13.0 - 17.0 g/dL 13.4 12.3(L) 9.4(L)  Hematocrit 39.0 - 52.0 % 39.3 36.7(L) 27.3(L)  Platelets 150 - 400 K/uL 170 166 84(L)   Lab Results  Component Value Date   MCV 92.0 08/19/2013   MCV 95.1 07/08/2013   MCV 96.8 05/17/2013   Lab Results  Component Value Date   TSH 1.554 07/08/2013   Lab Results  Component Value Date   HGBA1C 5.5 05/09/2013   Lipid Panel     Component Value Date/Time   CHOL 122 07/08/2013 0856   TRIG 89 07/08/2013 0856   HDL 42 07/08/2013 0856   CHOLHDL 2.9 07/08/2013 0856   VLDL 18 07/08/2013 0856   LDLCALC 62 07/08/2013 0856     RADIOLOGY: No results found.  IMPRESSION:  1. Essential hypertension   2. S/P aortic valve replacement   3. Pure hypercholesterolemia   4. GERD without esophagitis     ASSESSMENT AND PLAN: Mr. Veverly Fells is a 72 year old active white male who developed progressive severe aortic valve stenosis and underwent successful bovine pericardial tissue valve replacement on 05/13/2013.  He has a history of hypertension and hyperlipidemia.  He continues to exercise daily and over the winter months, participates in the cardiac rehabilitation program.  I wrote him a prescription for participation again effective December 2019 through the winter months.  I reviewed his most recent echo Doppler study which remains unchanged from previously and shows a stable bioprosthetic valve with a mean gradient of 12 and peak gradient of 25.  This is essentially unchanged from 2018 when his gradient was 11 and 20, respectively.  When I saw him last year I further titrated his lisinopril for more  optimal blood pressure control.  Repeat blood pressure today by me was improved at 130/70 on his regimen of Toprol-XL 50 mg in the morning and lisinopril 20 mg daily.  He continues to take generic Vytorin 10/40 for hyperlipidemia.  LDL cholesterol in August 2019 was 26 which was improved from 1 year previously when it was 90.  He is on he takes omeprazole as necessary for GERD.  I commended him on his continued exercise regimen.  I will see him in 5 to 6 months for follow-up evaluation.   Time spent: 25 minutes  [name], MD, [facility]  04/05/2018 7:00 PM

## 2018-04-03 NOTE — Patient Instructions (Addendum)
Medication Instructions:  Your physician recommends that you continue on your current medications as directed. Please refer to the Current Medication list given to you today.  If you need a refill on your cardiac medications before your next appointment, please call your pharmacy.   Follow-Up: At [facility], you and your health needs are our priority.  As part of our continuing mission to provide you with exceptional heart care, we have created designated Provider Care Teams.  These Care Teams include your primary Cardiologist (physician) and Advanced Practice Providers (APPs -  Physician Assistants and Nurse Practitioners) who all work together to provide you with the care you need, when you need it. You will need a follow up appointment in 4-5 months.  Please call our office 2 months in advance to schedule this appointment.  You may see Nicki Guadalajarahomas Kelly, MD or one of the following Advanced Practice Providers on your designated Care Team: BrookdaleHao Meng, [place] JerseyPA-C . Micah FlesherAngela Duke, PA-C

## 2018-04-05 ENCOUNTER — Encounter: Payer: Self-pay | Admitting: Cardiovascular Disease

## 2018-04-12 NOTE — Addendum Note (Signed)
Addended by: Lorain ChildesPRESSLEY, Carrolyn Hilmes A on: 04/12/2018 07:26 AM   Modules accepted: Orders

## 2018-04-16 ENCOUNTER — Encounter (HOSPITAL_COMMUNITY)
Admission: RE | Admit: 2018-04-16 | Discharge: 2018-04-16 | Disposition: A | Discharge status: Home / Self Care | Payer: Self-pay | Source: Ambulatory Visit | Attending: Cardiovascular Disease | Admitting: Cardiovascular Disease

## 2018-04-16 DIAGNOSIS — I10 Essential (primary) hypertension: Secondary | ICD-10-CM | POA: Insufficient documentation

## 2018-04-16 DIAGNOSIS — Z952 Presence of prosthetic heart valve: Secondary | ICD-10-CM | POA: Insufficient documentation

## 2018-04-16 DIAGNOSIS — K219 Gastro-esophageal reflux disease without esophagitis: Secondary | ICD-10-CM | POA: Insufficient documentation

## 2018-04-16 DIAGNOSIS — I350 Nonrheumatic aortic (valve) stenosis: Secondary | ICD-10-CM | POA: Insufficient documentation

## 2018-04-18 ENCOUNTER — Encounter (HOSPITAL_COMMUNITY): Payer: Self-pay

## 2018-04-20 ENCOUNTER — Encounter (HOSPITAL_COMMUNITY): Payer: Self-pay

## 2018-04-23 ENCOUNTER — Encounter (HOSPITAL_COMMUNITY)
Admission: RE | Admit: 2018-04-23 | Discharge: 2018-04-23 | Disposition: A | Discharge status: Home / Self Care | Payer: Self-pay | Source: Ambulatory Visit | Attending: Cardiovascular Disease | Admitting: Cardiovascular Disease

## 2018-04-27 ENCOUNTER — Encounter (HOSPITAL_COMMUNITY)
Admission: RE | Admit: 2018-04-27 | Discharge: 2018-04-27 | Disposition: A | Discharge status: Home / Self Care | Payer: Self-pay | Source: Ambulatory Visit | Attending: Cardiovascular Disease | Admitting: Cardiovascular Disease

## 2018-04-30 ENCOUNTER — Encounter (HOSPITAL_COMMUNITY)
Admission: RE | Admit: 2018-04-30 | Discharge: 2018-04-30 | Disposition: A | Discharge status: Home / Self Care | Payer: Self-pay | Source: Ambulatory Visit | Attending: Cardiovascular Disease | Admitting: Cardiovascular Disease

## 2018-05-04 ENCOUNTER — Encounter (HOSPITAL_COMMUNITY)
Admission: RE | Admit: 2018-05-04 | Discharge: 2018-05-04 | Disposition: A | Discharge status: Home / Self Care | Payer: Self-pay | Source: Ambulatory Visit | Attending: Cardiovascular Disease | Admitting: Cardiovascular Disease

## 2018-05-04 DIAGNOSIS — Z952 Presence of prosthetic heart valve: Secondary | ICD-10-CM | POA: Insufficient documentation

## 2018-05-04 DIAGNOSIS — I350 Nonrheumatic aortic (valve) stenosis: Secondary | ICD-10-CM | POA: Insufficient documentation

## 2018-05-04 DIAGNOSIS — K219 Gastro-esophageal reflux disease without esophagitis: Secondary | ICD-10-CM | POA: Insufficient documentation

## 2018-05-04 DIAGNOSIS — I10 Essential (primary) hypertension: Secondary | ICD-10-CM | POA: Insufficient documentation

## 2018-05-07 ENCOUNTER — Encounter (HOSPITAL_COMMUNITY)
Admission: RE | Admit: 2018-05-07 | Discharge: 2018-05-07 | Disposition: A | Discharge status: Home / Self Care | Payer: Self-pay | Source: Ambulatory Visit | Attending: Cardiovascular Disease | Admitting: Cardiovascular Disease

## 2018-05-08 DIAGNOSIS — Z961 Presence of intraocular lens: Secondary | ICD-10-CM | POA: Diagnosis not present

## 2018-05-08 DIAGNOSIS — H524 Presbyopia: Secondary | ICD-10-CM | POA: Diagnosis not present

## 2018-05-08 DIAGNOSIS — H52202 Unspecified astigmatism, left eye: Secondary | ICD-10-CM | POA: Diagnosis not present

## 2018-05-09 ENCOUNTER — Encounter (HOSPITAL_COMMUNITY)
Admission: RE | Admit: 2018-05-09 | Discharge: 2018-05-09 | Disposition: A | Discharge status: Home / Self Care | Payer: Self-pay | Source: Ambulatory Visit | Attending: Cardiovascular Disease | Admitting: Cardiovascular Disease

## 2018-05-11 ENCOUNTER — Encounter (HOSPITAL_COMMUNITY)
Admission: RE | Admit: 2018-05-11 | Discharge: 2018-05-11 | Disposition: A | Discharge status: Home / Self Care | Payer: Self-pay | Source: Ambulatory Visit | Attending: Cardiovascular Disease | Admitting: Cardiovascular Disease

## 2018-05-14 ENCOUNTER — Encounter (HOSPITAL_COMMUNITY)
Admission: RE | Admit: 2018-05-14 | Discharge: 2018-05-14 | Disposition: A | Discharge status: Home / Self Care | Payer: Medicare HMO | Source: Ambulatory Visit | Attending: Cardiovascular Disease | Admitting: Cardiovascular Disease

## 2018-05-16 ENCOUNTER — Encounter (HOSPITAL_COMMUNITY)
Admission: RE | Admit: 2018-05-16 | Discharge: 2018-05-16 | Disposition: A | Discharge status: Home / Self Care | Payer: Self-pay | Source: Ambulatory Visit | Attending: Cardiovascular Disease | Admitting: Cardiovascular Disease

## 2018-05-18 ENCOUNTER — Encounter (HOSPITAL_COMMUNITY)
Admission: RE | Admit: 2018-05-18 | Discharge: 2018-05-18 | Disposition: A | Discharge status: Home / Self Care | Payer: Self-pay | Source: Ambulatory Visit | Attending: Cardiovascular Disease | Admitting: Cardiovascular Disease

## 2018-05-21 ENCOUNTER — Encounter (HOSPITAL_COMMUNITY)
Admission: RE | Admit: 2018-05-21 | Discharge: 2018-05-21 | Disposition: A | Discharge status: Home / Self Care | Payer: Self-pay | Source: Ambulatory Visit | Attending: Cardiovascular Disease | Admitting: Cardiovascular Disease

## 2018-05-23 ENCOUNTER — Encounter (HOSPITAL_COMMUNITY): Payer: Self-pay

## 2018-05-25 ENCOUNTER — Encounter (HOSPITAL_COMMUNITY)
Admission: RE | Admit: 2018-05-25 | Discharge: 2018-05-25 | Disposition: A | Discharge status: Home / Self Care | Payer: Self-pay | Source: Ambulatory Visit | Attending: Cardiovascular Disease | Admitting: Cardiovascular Disease

## 2018-05-28 ENCOUNTER — Encounter (HOSPITAL_COMMUNITY)
Admission: RE | Admit: 2018-05-28 | Discharge: 2018-05-28 | Disposition: A | Discharge status: Home / Self Care | Payer: Self-pay | Source: Ambulatory Visit | Attending: Cardiovascular Disease | Admitting: Cardiovascular Disease

## 2018-05-30 ENCOUNTER — Encounter (HOSPITAL_COMMUNITY)
Admission: RE | Admit: 2018-05-30 | Discharge: 2018-05-30 | Disposition: A | Discharge status: Home / Self Care | Payer: Self-pay | Source: Ambulatory Visit | Attending: Cardiovascular Disease | Admitting: Cardiovascular Disease

## 2018-06-01 ENCOUNTER — Encounter (HOSPITAL_COMMUNITY)
Admission: RE | Admit: 2018-06-01 | Discharge: 2018-06-01 | Disposition: A | Discharge status: Home / Self Care | Payer: Self-pay | Source: Ambulatory Visit | Attending: Cardiovascular Disease | Admitting: Cardiovascular Disease

## 2018-06-04 ENCOUNTER — Encounter (HOSPITAL_COMMUNITY)
Admission: RE | Admit: 2018-06-04 | Discharge: 2018-06-04 | Disposition: A | Discharge status: Home / Self Care | Payer: Self-pay | Source: Ambulatory Visit | Attending: Cardiovascular Disease | Admitting: Cardiovascular Disease

## 2018-06-04 DIAGNOSIS — K219 Gastro-esophageal reflux disease without esophagitis: Secondary | ICD-10-CM | POA: Insufficient documentation

## 2018-06-04 DIAGNOSIS — I10 Essential (primary) hypertension: Secondary | ICD-10-CM | POA: Insufficient documentation

## 2018-06-04 DIAGNOSIS — Z952 Presence of prosthetic heart valve: Secondary | ICD-10-CM | POA: Insufficient documentation

## 2018-06-04 DIAGNOSIS — I350 Nonrheumatic aortic (valve) stenosis: Secondary | ICD-10-CM | POA: Insufficient documentation

## 2018-06-06 ENCOUNTER — Encounter (HOSPITAL_COMMUNITY)
Admission: RE | Admit: 2018-06-06 | Discharge: 2018-06-06 | Disposition: A | Discharge status: Home / Self Care | Payer: Self-pay | Source: Ambulatory Visit | Attending: Cardiovascular Disease | Admitting: Cardiovascular Disease

## 2018-06-08 ENCOUNTER — Encounter (HOSPITAL_COMMUNITY)
Admission: RE | Admit: 2018-06-08 | Discharge: 2018-06-08 | Disposition: A | Discharge status: Home / Self Care | Payer: Self-pay | Source: Ambulatory Visit | Attending: Cardiovascular Disease | Admitting: Cardiovascular Disease

## 2018-06-11 ENCOUNTER — Encounter (HOSPITAL_COMMUNITY)
Admission: RE | Admit: 2018-06-11 | Discharge: 2018-06-11 | Disposition: A | Discharge status: Home / Self Care | Payer: Medicare HMO | Source: Ambulatory Visit | Attending: Cardiovascular Disease | Admitting: Cardiovascular Disease

## 2018-06-13 ENCOUNTER — Encounter (HOSPITAL_COMMUNITY)
Admission: RE | Admit: 2018-06-13 | Discharge: 2018-06-13 | Disposition: A | Discharge status: Home / Self Care | Payer: Self-pay | Source: Ambulatory Visit | Attending: Cardiovascular Disease | Admitting: Cardiovascular Disease

## 2018-06-15 ENCOUNTER — Encounter (HOSPITAL_COMMUNITY)
Admission: RE | Admit: 2018-06-15 | Discharge: 2018-06-15 | Disposition: A | Discharge status: Home / Self Care | Payer: Self-pay | Source: Ambulatory Visit | Attending: Cardiovascular Disease | Admitting: Cardiovascular Disease

## 2018-06-18 ENCOUNTER — Encounter (HOSPITAL_COMMUNITY)
Admission: RE | Admit: 2018-06-18 | Discharge: 2018-06-18 | Disposition: A | Discharge status: Home / Self Care | Payer: Self-pay | Source: Ambulatory Visit | Attending: Cardiovascular Disease | Admitting: Cardiovascular Disease

## 2018-06-20 ENCOUNTER — Encounter (HOSPITAL_COMMUNITY)
Admission: RE | Admit: 2018-06-20 | Discharge: 2018-06-20 | Disposition: A | Discharge status: Home / Self Care | Payer: Self-pay | Source: Ambulatory Visit | Attending: Cardiovascular Disease | Admitting: Cardiovascular Disease

## 2018-06-22 ENCOUNTER — Encounter (HOSPITAL_COMMUNITY)
Admission: RE | Admit: 2018-06-22 | Discharge: 2018-06-22 | Disposition: A | Discharge status: Home / Self Care | Payer: Self-pay | Source: Ambulatory Visit | Attending: Cardiovascular Disease | Admitting: Cardiovascular Disease

## 2018-06-25 ENCOUNTER — Encounter (HOSPITAL_COMMUNITY)
Admission: RE | Admit: 2018-06-25 | Discharge: 2018-06-25 | Disposition: A | Discharge status: Home / Self Care | Payer: Self-pay | Source: Ambulatory Visit | Attending: Cardiovascular Disease | Admitting: Cardiovascular Disease

## 2018-06-27 ENCOUNTER — Encounter (HOSPITAL_COMMUNITY)
Admission: RE | Admit: 2018-06-27 | Discharge: 2018-06-27 | Disposition: A | Discharge status: Home / Self Care | Payer: Self-pay | Source: Ambulatory Visit | Attending: Cardiovascular Disease | Admitting: Cardiovascular Disease

## 2018-06-29 ENCOUNTER — Encounter (HOSPITAL_COMMUNITY)
Admission: RE | Admit: 2018-06-29 | Discharge: 2018-06-29 | Disposition: A | Discharge status: Home / Self Care | Payer: Self-pay | Source: Ambulatory Visit | Attending: Cardiovascular Disease | Admitting: Cardiovascular Disease

## 2018-07-02 ENCOUNTER — Encounter (HOSPITAL_COMMUNITY): Payer: Self-pay

## 2018-07-04 ENCOUNTER — Encounter (HOSPITAL_COMMUNITY): Payer: Self-pay

## 2018-07-06 ENCOUNTER — Encounter (HOSPITAL_COMMUNITY): Payer: Self-pay

## 2018-07-09 ENCOUNTER — Encounter (HOSPITAL_COMMUNITY): Payer: Self-pay

## 2018-07-11 ENCOUNTER — Encounter (HOSPITAL_COMMUNITY): Payer: Self-pay

## 2018-07-13 ENCOUNTER — Encounter (HOSPITAL_COMMUNITY): Payer: Self-pay

## 2018-07-16 ENCOUNTER — Encounter (HOSPITAL_COMMUNITY): Payer: Self-pay

## 2018-07-18 ENCOUNTER — Encounter (HOSPITAL_COMMUNITY): Payer: Self-pay

## 2018-07-20 ENCOUNTER — Encounter (HOSPITAL_COMMUNITY): Payer: Self-pay

## 2018-07-23 ENCOUNTER — Encounter (HOSPITAL_COMMUNITY): Payer: Self-pay

## 2018-07-25 ENCOUNTER — Encounter (HOSPITAL_COMMUNITY): Payer: Self-pay

## 2018-07-27 ENCOUNTER — Encounter (HOSPITAL_COMMUNITY): Payer: Self-pay

## 2018-07-30 ENCOUNTER — Encounter (HOSPITAL_COMMUNITY): Payer: Self-pay

## 2018-08-28 ENCOUNTER — Telehealth: Payer: Self-pay | Admitting: Cardiovascular Disease

## 2018-08-29 ENCOUNTER — Telehealth: Payer: Medicare HMO | Admitting: Cardiovascular Disease

## 2018-09-06 ENCOUNTER — Telehealth: Payer: Medicare HMO | Admitting: Cardiovascular Disease

## 2018-09-07 DIAGNOSIS — E785 Hyperlipidemia, unspecified: Secondary | ICD-10-CM | POA: Diagnosis not present

## 2018-09-07 DIAGNOSIS — Z791 Long term (current) use of non-steroidal anti-inflammatories (NSAID): Secondary | ICD-10-CM | POA: Diagnosis not present

## 2018-09-07 DIAGNOSIS — Z7982 Long term (current) use of aspirin: Secondary | ICD-10-CM | POA: Diagnosis not present

## 2018-09-07 DIAGNOSIS — K219 Gastro-esophageal reflux disease without esophagitis: Secondary | ICD-10-CM | POA: Diagnosis not present

## 2018-09-07 DIAGNOSIS — I10 Essential (primary) hypertension: Secondary | ICD-10-CM | POA: Diagnosis not present

## 2018-09-10 ENCOUNTER — Telehealth: Payer: Self-pay | Admitting: Cardiovascular Disease

## 2018-09-10 NOTE — Telephone Encounter (Signed)
New message   Patient is returning call about his virtual visiti. Please call.

## 2018-09-11 ENCOUNTER — Encounter: Payer: Self-pay | Admitting: Cardiovascular Disease

## 2018-09-11 ENCOUNTER — Telehealth (INDEPENDENT_AMBULATORY_CARE_PROVIDER_SITE_OTHER): Payer: Medicare HMO | Admitting: Cardiovascular Disease

## 2018-09-11 VITALS — Ht 69.5 in | Wt 180.0 lb

## 2018-09-11 DIAGNOSIS — K219 Gastro-esophageal reflux disease without esophagitis: Secondary | ICD-10-CM

## 2018-09-11 DIAGNOSIS — Z952 Presence of prosthetic heart valve: Secondary | ICD-10-CM

## 2018-09-11 DIAGNOSIS — E7800 Pure hypercholesterolemia, unspecified: Secondary | ICD-10-CM | POA: Diagnosis not present

## 2018-09-11 DIAGNOSIS — I10 Essential (primary) hypertension: Secondary | ICD-10-CM

## 2018-09-11 NOTE — Telephone Encounter (Signed)
Patient had virtual visit today with Dr.Kelly.

## 2018-09-11 NOTE — Progress Notes (Signed)
Virtual Visit via Telephone Note   This visit type was conducted due to national recommendations for restrictions regarding the COVID-19 Pandemic (e.g. social distancing) in an effort to limit this patient's exposure and mitigate transmission in our community.  Due to his co-morbid illnesses, this patient is at least at moderate risk for complications without adequate follow up.  This format is felt to be most appropriate for this patient at this time.  The patient did not have access to video technology/had technical difficulties with video requiring transitioning to audio format only (telephone).  All issues noted in this document were discussed and addressed.  No physical exam could be performed with this format.  Please refer to the patient's chart for his  consent to telehealth for [facility].   Date:  09/11/2018   ID:  Drew Andrews, DOB 07/22/1945, MRN [id number]  Patient Location: Home Provider Location: Home  PCP:  Tisovec, Adelfa Koh, MD  Cardiologist:  Nicki Guadalajara, MD  Electrophysiologist:  None   Evaluation Performed:  Follow-Up Visit  Chief Complaint:  6 month F/U  History of P6 month F/Uresent Illness:    Drew Andrews is a 73 y.o. male who was first told of having a heart murmur back in the 1980s when he was planning to work at  [employer] and had a physical exam done at that time. I have  seen him since 2005 when he was referred to me by [name] for cardiac murmur. Since 2005 he had undergone follow-up  echo Doppler studies to assess his aortic valve. In 2005 his aortic transvalvular gradient was 37 with a mean gradient of 18. Over the last 9 years, his aortic valve murmur gradually become more significant and he remained asymptomatic.  An echo in 11/03/2014showed normal LV function with  grade 2 diastolic dysfunction. His aortic valve again was moderately calcified. The mean gradient had increased to 47 and peak instantaneous gradient 86 giving a  calculated aortic valve area of 0.63 cm. He did have mitral annular calcification. His left atrium was moderately dilated. He did have very mild RV dilatation.  When I saw him in November 2014 Mr. Thies was continuing to exercise but there was new development of exertional shortness of breath. At that time, I strongly recommended cardiac catheterization and he underwent right and left heart cardiac catheterization on 03/15/2013. This confirmed severe aortic valve stenosis mild pulmonary hypertension. He had mild chronic calcification but nonobstructive 20% narrowing in the LAD.   He underwent successful aortic valve replacement surgery by Dr. Evelene Croon 05/13/2013 and had a 25 mm [facility] bovine pericardial valve inserted. He has done remarkably well following his valve replacement surgery. He denies any episodes of palpitations. He has resumed activity.  Since his valve replacement, he has continued to feel well.  He denies any shortness of breath or chest pain.  He does exercise regularly.  Typically in the cold winter months.  He likes to exercise at cardiac rehabilitation for the months of January and February..  However, he has had several rare occurrences of diplopia it are short lived.  He has been taking aspirin 81 mg daily.  A repeat echo Doppler study on 12/23/2013.revealed an EF 55-60% with mild LVH.  His aortic prosthesis was well-seated and open well.  There was only trivial aortic insufficiency.  He did have mild dilatation of his left atrium and mitral annular calcification.  There also was some dilatation of his RV.  His echo in October 2016 continued to show excellent LV function with an EF of 55-60% and he had normal diastolic parameters.  The bioprosthetic AV was  functioning normally.  The gradients were felt to be very minimally elevated and unchanged from his previous study with a mean gradient of 11 and peak gradient of 23 mm.  There was no evidence for aortic  insufficiency.  There was evidence for mitral annular calcification and mild LA dilatation.  He underwent a 2-D echo Doppler study on 08/02/2016.  He had normal LV function with EF of 60-65% and grade 2 diastolic dysfunction.  His aortic bioprosthesis was well-seated and was functioning well.  The mean gradient was 11 and peak gradient 20.  He had mild MR.  There was mild TR and mild LA dilatation.    He participates the maintenance phase of cardiac rehabilitation over the winter months and will be ending this week.  Typically over the summer he rides his bike.  On days he does not do cardiac rehabilitation in the winter he often walks and if the weather is nice.  He may ride his bike occasionally.  He typically gained some weight over the winter months but loses weight over the summer when he is more active outside.  He denies any chest pain, presyncope or syncope, palpitations, or change in exercise tolerance.  Recently, he has noticed his blood pressure being slightly elevated in the 130s to 140s when he arrives at cardiac rehabilitation after exercise his blood pressure is normal.  He has increased his lisinopril to 7.5 mg.   When I saw him in October 2018, he denied any episodes of chest pain or shortness of breath.  He was exercising and walks for at least 30 minutes or bikes 7 days per week.  Over the winter months, he again participated in the cardiac rehabilitation program.  His blood pressure was mildly elevated and I titrated lisinopril to 15 mg daily.  He continues to take metoprolol succinate 50 mg.  He has been on generic Vytorin 10/40 for hyperlipidemia.   He underwent a follow-up echo Doppler study on January 08, 2018.  This showed normal systolic function with grade 2 diastolic dysfunction.  EF was 55 to 60%.  His aortic bioprosthesis was well-seated.  Mean gradient was 12.  There was no AI.  His a sending aorta was mildly dilated.  There was mild MR and mild to moderate biatrial  enlargement.  He continues to be asymptomatic and feels well.  He is enjoying retirement.    Since I last saw him in December 2019, he again participated in the cardiac rehabilitation program over the winter months.  He is now back riding his bike at least 30 minutes a day and also on days that he does not ride his bike he typically walks at a fast pace.  He denies any chest pain PND orthopnea.  He continues to take lisinopril 20 mg and Toprol-XL 50 mg for hypertension.  His blood pressure has been well controlled and typically when he leaves cardiac rehab it would be approximately 120/70.  He continues to be on Vytorin 10/40 for hyperlipidemia.  He has a hiatal hernia and GERD which is well controlled with omeprazole.  His laboratory typically is done by Dr. Wylene Simmer.  He presents for evaluation   The patient does not have symptoms concerning for COVID-19 infection (fever, chills, cough, or new shortness of breath).    Past Medical History:  Diagnosis Date  GERD (gastroesophageal reflux disease)    Hyperlipidemia    Hypertension    LVH (left ventricular hypertrophy)    with aortic stenosis-bicuspid   PONV (postoperative nausea and vomiting)    as a child   S/P AVR (aortic valve replacement) 05/13/2013   Seasonal allergies    Past Surgical History:  Procedure Laterality Date   AORTIC VALVE REPLACEMENT N/A 05/13/2013   Procedure: AORTIC VALVE REPLACEMENT (AVR);  Surgeon: Alleen Borne, MD;  [facility];  Service: Open Heart Surgery;  Laterality: N/A;   APPENDECTOMY  age 73   CARDIAC CATHETERIZATION     COLONOSCOPY     X 2   HIATAL HERNIA REPAIR     INTRAOPERATIVE TRANSESOPHAGEAL ECHOCARDIOGRAM N/A 05/13/2013   Procedure: INTRAOPERATIVE TRANSESOPHAGEAL ECHOCARDIOGRAM;  Surgeon: Alleen Borne, MD;  Location: [facility] OR;  Service: Open Heart Surgery;  Laterality: N/A;   LEFT HEART CATHETERIZATION WITH CORONARY ANGIOGRAM N/A 03/15/2013   Procedure: LEFT HEART CATHETERIZATION  WITH CORONARY ANGIOGRAM;  Surgeon: Lennette Bihari, MD;  Location: [facility] CATH LAB;  Service: Cardiovascular;  Laterality: N/A;   TONSILLECTOMY     TRANSTHORACIC ECHOCARDIOGRAM  03/04/2013   EF 55-60%, grade 2 diastolic dysfunction, AV with mod calcified leaflets & mild regurg, calcified MV annulus, LA mod dilated, RV mildly dilated     Current Meds  Medication Sig   amoxicillin (AMOXIL) 500 MG tablet Take 4 tablets 1 hour prior to dental procedure   aspirin 81 MG tablet Take 81 mg by mouth daily.   ezetimibe-simvastatin (VYTORIN) 10-40 MG tablet Take 1 tablet by mouth at bedtime.   Ibuprofen-Diphenhydramine Cit (ADVIL PM PO) Take 2-3 tablets by mouth at bedtime as needed (for sleep).    lisinopril (PRINIVIL,ZESTRIL) 20 MG tablet Take 1 tablet (20 mg total) by mouth daily.   metoprolol succinate (TOPROL-XL) 50 MG 24 hr tablet TAKE 1 TABLET BY MOUTH  DAILY WITH OR IMMEDIATLEY  FOLLOWING A MEAL   omeprazole (PRILOSEC OTC) 20 MG tablet Take 20 mg by mouth daily.    OVER THE COUNTER MEDICATION Patient takes iron tablet daily.     Allergies:   Scopolamine   Social History   Tobacco Use   Smoking status: Former Smoker    Packs/day: 1.00    Last attempt to quit: 02/10/1974    Years since quitting: 44.6   Smokeless tobacco: Never Used  Substance Use Topics   Alcohol use: Yes    Comment: Drinks 1 bottle of wine daily and 1 beer   Drug use: No     Family Hx: The patient's family history includes Acute myelogenous leukemia in his brother; Diabetes in his mother; Heart attack in his father and maternal grandfather; Hyperlipidemia in his mother; Hypertension in his mother; Pneumonia in his maternal grandmother. There is no history of Colon cancer or Stomach cancer.  ROS:   Please see the history of present illness.     General: Negative; No fevers, chills, or night sweats;  HEENT: Negative; No changes in vision or hearing, sinus congestion, difficulty swallowing Pulmonary:  Negative; No cough, wheezing, shortness of breath, hemoptysis Cardiovascular:  See HPI GI: Negative; No nausea, vomiting, diarrhea, or abdominal pain GU: Negative; No dysuria, hematuria, or difficulty voiding Musculoskeletal:  Hematologic/Oncology: Negative; no easy bruising, bleeding Endocrine: Negative; no heat/cold intolerance; no diabetes Neuro: Rare episodes of diplopia; no changes in balance, headaches Skin: Negative; No rashes or skin lesions Psychiatric: Negative; No behavioral problems, depression Sleep: Negative; No snoring, daytime sleepiness, hypersomnolence, bruxism,  restless legs, hypnogognic hallucinations, no cataplexy Other comprehensive 14 point system review is negative. All other systems reviewed and are negative.   Prior CV studies:   The following studies were reviewed today:  ECHO Study Conclusions: 01/09/2018  - Left ventricle: The cavity size was normal. Wall thickness was   increased in a pattern of mild LVH. Systolic function was normal.   The estimated ejection fraction was in the range of 55% to 60%.   Wall motion was normal; there were no regional wall motion   abnormalities. Features are consistent with a pseudonormal left   ventricular filling pattern, with concomitant abnormal relaxation   and increased filling pressure (grade 2 diastolic dysfunction).   Doppler parameters are consistent with high ventricular filling   pressure. - Aortic valve: A bioprosthesis was present. - Ascending aorta: The ascending aorta was mildly dilated. - Mitral valve: Calcified annulus. Mildly thickened leaflets .   There was mild regurgitation. - Left atrium: The atrium was moderately dilated. - Right atrium: The atrium was mildly dilated.  Impressions:  - Normal LV systolic function; moderate diastolic dysfunction; mild   LVH; s/p AVR with mean gradient 12 mmHg and no AI; mildly dilated   ascending aorta; mild MR; biatrial enlargement.  Labs/Other Tests and  Data Reviewed:    EKG:  An ECG dated 04/03/2018 was personally reviewed today and demonstrated:  Sinus bradycardia 59 bpm with first-degree AV block.  PR interval 278 ms.  LVH by voltage criteria.  Recent Labs: No results found for requested labs within last 8760 hours.   Recent Lipid Panel Lab Results  Component Value Date/Time   CHOL 122 07/08/2013 08:56 AM   TRIG 89 07/08/2013 08:56 AM   HDL 42 07/08/2013 08:56 AM   CHOLHDL 2.9 07/08/2013 08:56 AM   LDLCALC 62 07/08/2013 08:56 AM    Wt Readings from Last 3 Encounters:  09/11/18 180 lb (81.6 kg)  04/03/18 188 lb 3.2 oz (85.4 kg)  06/30/17 191 lb (86.6 kg)     Objective:    Vital Signs:  Ht 5' 9.5" (1.765 m)    Wt 180 lb (81.6 kg)    BMI 26.20 kg/m    He denies any change in physical appearance since his last evaluation with me Breathing is normal and not labored I do not hear any audible wheezing His rhythm by palpation is stable without extra beats or skips He denies abdominal discomfort.  His GERD is controlled There is no edema or swelling There are no rashes according to the patient There are no changes neurologically He has normal cognition mood and affect  ASSESSMENT & PLAN:    1. Aortic valve replacement: He underwent successful bovine pericardial tissue valve replacement on May 13, 2013 for severe aortic valve stenosis performed by Dr. Laneta SimmersBartle. He has continued to do well.  He again participated in cardiac rehab over the winter months and is now back riding his bicycle as well as walking at a fast pace without symptomatology.  I again reviewed his most recent echo Doppler study from 2019.  I will schedule him for a 3963-month follow-up echo Doppler study in November 2020 and will see him for follow-up assessment. 2. Essential hypertension: Blood pressure is stable on lisinopril 20 mg and Toprol-XL 50 mg. 3. Hyperlipidemia: He continues to be on Vytorin 10/40 with stable laboratory.  He is tolerating this well  without myalgias.  LDL of August 2019 was 76. 4. GERD with history of hiatal hernia: Had undergone  evaluation by Dr. Bufford Buttner off in the past and has been on long-term omeprazole.  COVID-19 Education: The signs and symptoms of COVID-19 were discussed with the patient and how to seek care for testing (follow up with PCP or arrange E-visit).  The importance of social distancing was discussed today.  Time:   Today, I have spent 21 minutes with the patient with telehealth technology discussing the above problems.     Medication Adjustments/Labs and Tests Ordered: Current medicines are reviewed at length with the patient today.  Concerns regarding medicines are outlined above.   Tests Ordered: No orders of the defined types were placed in this encounter.   Medication Changes: No orders of the defined types were placed in this encounter.   Disposition:  Follow up echo and ov in 6 month(s)  Signed, Nicki Guadalajara, MD  09/11/2018 3:35 PM    [facility]

## 2018-09-11 NOTE — Patient Instructions (Addendum)
Medication Instructions:  The current medical regimen is effective;  continue present plan and medications.  If you need a refill on your cardiac medications before your next appointment, please call your pharmacy.   Testing/Procedures: Echocardiogram (in November) - Your physician has requested that you have an echocardiogram. Echocardiography is a painless test that uses sound waves to create images of your heart. It provides your doctor with information about the size and shape of your heart and how well your heart's chambers and valves are working. This procedure takes approximately one hour. There are no restrictions for this procedure. This will be performed at our [facility] location - [address], [address].  Follow-Up: At [facility], you and your health needs are our priority.  As part of our continuing mission to provide you with exceptional heart care, we have created designated Provider Care Teams.  These Care Teams include your primary Cardiologist (physician) and Advanced Practice Providers (APPs -  Physician Assistants and Nurse Practitioners) who all work together to provide you with the care you need, when you need it. You will need a follow up appointment in 6 months (after ECHO).  Please call our office 2 months in advance to schedule this appointment.  You may see Nicki Guadalajara, MD or one of the following Advanced Practice Providers on your designated Care Team: [place], [place] . Micah Flesher, PA-C

## 2018-10-30 NOTE — Telephone Encounter (Signed)
Open n error °

## 2018-12-25 DIAGNOSIS — Z125 Encounter for screening for malignant neoplasm of prostate: Secondary | ICD-10-CM | POA: Diagnosis not present

## 2018-12-25 DIAGNOSIS — E7800 Pure hypercholesterolemia, unspecified: Secondary | ICD-10-CM | POA: Diagnosis not present

## 2018-12-25 DIAGNOSIS — Z23 Encounter for immunization: Secondary | ICD-10-CM | POA: Diagnosis not present

## 2018-12-27 DIAGNOSIS — R82998 Other abnormal findings in urine: Secondary | ICD-10-CM | POA: Diagnosis not present

## 2019-01-01 DIAGNOSIS — N183 Chronic kidney disease, stage 3 (moderate): Secondary | ICD-10-CM | POA: Diagnosis not present

## 2019-01-01 DIAGNOSIS — Z952 Presence of prosthetic heart valve: Secondary | ICD-10-CM | POA: Diagnosis not present

## 2019-01-01 DIAGNOSIS — I517 Cardiomegaly: Secondary | ICD-10-CM | POA: Diagnosis not present

## 2019-01-01 DIAGNOSIS — Z1339 Encounter for screening examination for other mental health and behavioral disorders: Secondary | ICD-10-CM | POA: Diagnosis not present

## 2019-01-01 DIAGNOSIS — Z1331 Encounter for screening for depression: Secondary | ICD-10-CM | POA: Diagnosis not present

## 2019-01-01 DIAGNOSIS — N529 Male erectile dysfunction, unspecified: Secondary | ICD-10-CM | POA: Diagnosis not present

## 2019-01-01 DIAGNOSIS — D696 Thrombocytopenia, unspecified: Secondary | ICD-10-CM | POA: Diagnosis not present

## 2019-01-01 DIAGNOSIS — Q231 Congenital insufficiency of aortic valve: Secondary | ICD-10-CM | POA: Diagnosis not present

## 2019-01-01 DIAGNOSIS — Z0000 Encounter for general adult medical examination without abnormal findings: Secondary | ICD-10-CM | POA: Diagnosis not present

## 2019-01-01 DIAGNOSIS — K219 Gastro-esophageal reflux disease without esophagitis: Secondary | ICD-10-CM | POA: Diagnosis not present

## 2019-03-14 ENCOUNTER — Ambulatory Visit (HOSPITAL_COMMUNITY): Payer: Medicare HMO | Attending: Cardiology

## 2019-03-14 ENCOUNTER — Encounter (INDEPENDENT_AMBULATORY_CARE_PROVIDER_SITE_OTHER): Payer: Self-pay

## 2019-03-14 ENCOUNTER — Other Ambulatory Visit: Payer: Self-pay

## 2019-03-14 DIAGNOSIS — Z952 Presence of prosthetic heart valve: Secondary | ICD-10-CM | POA: Diagnosis not present

## 2019-04-04 ENCOUNTER — Encounter: Payer: Self-pay | Admitting: Cardiovascular Disease

## 2019-04-04 ENCOUNTER — Other Ambulatory Visit: Payer: Self-pay

## 2019-04-04 ENCOUNTER — Ambulatory Visit: Payer: Medicare HMO | Admitting: Cardiovascular Disease

## 2019-04-04 DIAGNOSIS — E7800 Pure hypercholesterolemia, unspecified: Secondary | ICD-10-CM

## 2019-04-04 DIAGNOSIS — K449 Diaphragmatic hernia without obstruction or gangrene: Secondary | ICD-10-CM

## 2019-04-04 DIAGNOSIS — K219 Gastro-esophageal reflux disease without esophagitis: Secondary | ICD-10-CM

## 2019-04-04 DIAGNOSIS — I350 Nonrheumatic aortic (valve) stenosis: Secondary | ICD-10-CM | POA: Diagnosis not present

## 2019-04-04 DIAGNOSIS — I10 Essential (primary) hypertension: Secondary | ICD-10-CM | POA: Diagnosis not present

## 2019-04-04 MED ORDER — AMOXICILLIN 500 MG PO TABS: ORAL_TABLET | ORAL | 1 refills | Status: DC

## 2019-04-04 NOTE — Patient Instructions (Addendum)
Medication Instructions:  Continue current medications  *If you need a refill on your cardiac medications before your next appointment, please call your pharmacy*  Lab Work: None Ordered  Testing/Procedures: None Ordered  Follow-Up: At [facility], you and your health needs are our priority.  As part of our continuing mission to provide you with exceptional heart care, we have created designated Provider Care Teams.  These Care Teams include your primary Cardiologist (physician) and Advanced Practice Providers (APPs -  Physician Assistants and Nurse Practitioners) who all work together to provide you with the care you need, when you need it.  Your next appointment:   6 month(s)  The format for your next appointment:   In Person  Provider:   Shelva Majestic, MD  Other Instructions

## 2019-04-04 NOTE — Progress Notes (Signed)
Cardiology Office Note    Date:  04/06/2019   ID:  Drew Andrews, DOB 24-Oct-1945, MRN [id number]  PCP:  Haywood Pao, MD  Cardiologist:  Shelva Majestic, MD   57-monthfollow-up  History of Present Illness:  Drew Andrews a 73y.o. male who was first told of having a heart murmur back in the 1980s when he was planning to work at [employer] had a physical exam done at that time. I have seen him since 2005 when he was referred to me by Dr. MEarlean Shawlfor cardiac murmur. Since 2005 he had undergone follow-up echo Doppler studies to assess his aortic valve. In 2005 his aortic transvalvular gradient was 37 with a mean gradient of 18. Over the last 9 years, his aortic valve murmur gradually become more significant and he remained asymptomatic. An echo in 11/03/2014showed normal LV function with grade 2 diastolic dysfunction. His aortic valve again was moderately calcified. The mean gradient had increased to 47 and peak instantaneous gradient 86 giving a calculated aortic valve area of 0.63 cm. He did have mitral annular calcification. His left atrium was moderately dilated. He did have very mild RV dilatation.  When I saw him in November 2014 Drew Andrews was continuing to exercise but there was new development of exertional shortness of breath. At that time, I strongly recommended cardiac catheterization and he underwent right and left heart cardiac catheterization on 03/15/2013. This confirmed severe aortic valve stenosis mild pulmonary hypertension. He had mild chronic calcification but nonobstructive 20% narrowing in the LAD.   He underwent successful aortic valve replacement surgery by Dr. BGilford Raid01/04/2014 and [facility]. He has done remarkably well following his valve replacement surgery. He denies any episodes of palpitations. He has resumed activity.  Since his valve replacement, he has continued  to feel well. He denies any shortness of breath or chest pain. He does exercise regularly. Typically in the cold winter months. He likes to exercise at cardiac rehabilitation for the months of January and February.. However, he has had several rare occurrences of diplopia it are short lived. He has been taking aspirin 81 mg daily. A repeat echo Doppler study on 12/23/2013.revealed an EF 55-60% with mild LVH. His aortic prosthesis was well-seated and open well. There was only trivial aortic insufficiency. He did have mild dilatation of his left atrium and mitral annular calcification. There also was some dilatation of his RV.   His echo in October 2016 continued to show excellent LV function with an EF of 55-60% and he had normal diastolic parameters. The bioprosthetic AV was functioning normally. The gradients were felt to be very minimally elevated and unchanged from his previous study with a mean gradient of 11 and peak gradient of 23 mm. There was no evidence for aortic insufficiency. There was evidence for mitral annular calcification and mild LA dilatation.  He underwent a 2-D echo Doppler study on 08/02/2016. He had normal LV function with EF of 60-65% and grade 2 diastolic dysfunction. His aortic bioprosthesis was well-seated and was functioning well. The mean gradient was 11 and peak gradient 20. He had mild MR. There was mild TR and mild LA dilatation.   He participates the maintenance phase of cardiac rehabilitation over the winter months and will be ending this week. Typically over the summer he rides his bike. On days he does not do cardiac rehabilitation in the winter he often walks  and if the weather is nice. He may ride his bike occasionally. He typically gained some weight over the winter months but loses weight over the summer when he is more active outside. He denies any chest pain, presyncope or syncope, palpitations, or change in exercise tolerance. Recently,  he has noticed his blood pressure being slightly elevated in the 130s to 140s when he arrives at cardiac rehabilitation after exercise his blood pressure is normal. He has increased his lisinopril to 7.5 mg.   When Isaw him in October 2018, he denied any episodes of chest pain or shortness of breath. He was exercising and walks for at least 30 minutes or bikes 7 days per week. Over the winter months, he again participated in the cardiac rehabilitation program. His blood pressure was mildly elevated and I titrated lisinopril to 15 mg daily. He continues to take metoprolol succinate 50 mg. He has been on generic Vytorin 10/40 for hyperlipidemia.   He underwent a follow-up echo Doppler study on January 08, 2018. This showed normal systolic function with grade 2 diastolic dysfunction. EF was 55 to 60%. His aortic bioprosthesis was well-seated. Mean gradient was 12. There was no AI. His a sending aorta was mildly dilated. There was mild MR and mild to moderate biatrial enlargement. He continues to be asymptomatic and feels well. He is enjoying retirement.   Since I  saw him in December 2019, he again participated in the cardiac rehabilitation program over the winter months.  He was now back riding his bike at least 30 minutes a day and also on days that he does not ride his bike he typically walks at a fast pace.    I last evaluated him in a telemedicine visit on Sep 11, 2018 which time he continued to be stable and denied any chest pain, PND, or orthopnea.  He was taking  lisinopril 20 mg and Toprol-XL 50 mg for hypertension.  His blood pressure has been well controlled and typically when he leaves cardiac rehab it would be approximately 120/70.  He continues to be on Vytorin 10/40 for hyperlipidemia.  He has a hiatal hernia and GERD which is well controlled with omeprazole.  His laboratory typically is done by Dr. Osborne Casco.  Since I last saw him, he has continued to be stable.  He  continues to ride his bike but in the winter months seems to walk more due to the weather.  Typically walks for 30 minutes for 2.2 miles.  He underwent a follow-up echo Doppler study on March 14, 2019.  EF remains 60 to 65% with mild LVH of the basal septum.  Diastolic function was indeterminate.  Had a stable bioprosthetic aortic valve with normal gradients with a mean gradient of 10 and peak gradient of 18.7.  There was no perivalvular aortic insufficiency.  He had mild pulmonic regurgitation.  His ascending aorta measured 39 mm.  He has continued to be on generic Vytorin 10/40 and lipid studies in August 2020 showed a total cholesterol 129, HDL 43, LDL 72, and triglycerides 70.  He continues to be on Toprol-XL 50 mg and lisinopril 20 mg daily.  GERD is controlled with omeprazole.  He presents for evaluation   Past Medical History:  Diagnosis Date  . GERD (gastroesophageal reflux disease)   . Hyperlipidemia   . Hypertension   . LVH (left ventricular hypertrophy)    with aortic stenosis-bicuspid  . PONV (postoperative nausea and vomiting)    as a child  . S/P  AVR (aortic valve replacement) 05/13/2013  . Seasonal allergies     Past Surgical History:  Procedure Laterality Date  . AORTIC VALVE REPLACEMENT N/A 05/13/2013   Procedure: AORTIC VALVE REPLACEMENT (AVR);  Surgeon: [name], MD;  Location: [address];  Service: Open Heart Surgery;  Laterality: N/A;  . APPENDECTOMY  age 18  . CARDIAC CATHETERIZATION    . COLONOSCOPY     X 2  . HIATAL HERNIA REPAIR    . INTRAOPERATIVE TRANSESOPHAGEAL ECHOCARDIOGRAM N/A 05/13/2013   Procedure: INTRAOPERATIVE TRANSESOPHAGEAL ECHOCARDIOGRAM;  Surgeon: [name], MD;  Location: [facility] OR;  Service: Open Heart Surgery;  Laterality: N/A;  . LEFT HEART CATHETERIZATION WITH CORONARY ANGIOGRAM N/A 03/15/2013   Procedure: LEFT HEART CATHETERIZATION WITH CORONARY ANGIOGRAM;  Surgeon: [name], MD;  Location: [facility] CATH LAB;  Service: Cardiovascular;   Laterality: N/A;  . TONSILLECTOMY    . TRANSTHORACIC ECHOCARDIOGRAM  03/04/2013   EF 62-03%, grade 2 diastolic dysfunction, AV with mod calcified leaflets & mild regurg, calcified MV annulus, LA mod dilated, RV mildly dilated    Current Medications: Outpatient Medications Prior to Visit  Medication Sig Dispense Refill  . aspirin 81 MG tablet Take 81 mg by mouth daily.    [name] ezetimibe-simvastatin (VYTORIN) 10-40 MG tablet Take 1 tablet by mouth at bedtime. 90 tablet 3  . Ibuprofen-Diphenhydramine Cit (ADVIL PM PO) Take 2-3 tablets by mouth at bedtime as needed (for sleep).     [name] lisinopril (PRINIVIL,ZESTRIL) 20 MG tablet Take 1 tablet (20 mg total) by mouth daily. 90 tablet 3  . metoprolol succinate (TOPROL-XL) 50 MG 24 hr tablet TAKE 1 TABLET BY MOUTH  DAILY WITH OR IMMEDIATLEY  FOLLOWING A MEAL 90 tablet 3  . omeprazole (PRILOSEC OTC) 20 MG tablet Take 20 mg by mouth daily.     [name] OVER THE COUNTER MEDICATION Patient takes iron tablet daily.    [name] amoxicillin (AMOXIL) 500 MG tablet Take 4 tablets 1 hour prior to dental procedure 4 tablet 11   No facility-administered medications prior to visit.      Allergies:   Scopolamine   Social History   Socioeconomic History  . Marital status: Married    Spouse name: Not on file  . Number of children: 1  . Years of education: college  . Highest education level: Not on file  Occupational History  . Not on file  Social Needs  . Financial resource strain: Not on file  . Food insecurity    Worry: Not on file    Inability: Not on file  . Transportation needs    Medical: Not on file    Non-medical: Not on file  Tobacco Use  . Smoking status: Former Smoker    Packs/day: 1.00    Quit date: 02/10/1974    Years since quitting: 45.1  . Smokeless tobacco: Never Used  Substance and Sexual Activity  . Alcohol use: Yes    Comment: Drinks 1 bottle of wine daily and 1 beer  . Drug use: No  . Sexual activity: Not on file  Lifestyle  . Physical  activity    Days per week: Not on file    Minutes per session: Not on file  . Stress: Not on file  Relationships  . Social Herbalist on phone: Not on file    Gets together: Not on file    Attends religious service: Not on file    Active member of club or organization: Not  on file    Attends meetings of clubs or organizations: Not on file    Relationship status: Not on file  Other Topics Concern  . Not on file  Social History Narrative  . Not on file     Additional social history is notable in that he has a Paediatric nurse in IT sales professional. He is retired from [employer]. He quit tobacco in 1979. He continues to exercise 7 days per week.  Family History:  The patient's family history includes Acute myelogenous leukemia in his brother; Diabetes in his mother; Heart attack in his father and maternal grandfather; Hyperlipidemia in his mother; Hypertension in his mother; Pneumonia in his maternal grandmother.   ROS General: Negative; No fevers, chills, or night sweats;  HEENT: Negative; No changes in vision or hearing, sinus congestion, difficulty swallowing Pulmonary: Negative; No cough, wheezing, shortness of breath, hemoptysis Cardiovascular: Negative; No chest pain, presyncope, syncope, palpitations GI: Negative; No nausea, vomiting, diarrhea, or abdominal pain GU: Negative; No dysuria, hematuria, or difficulty voiding Musculoskeletal: Negative; no myalgias, joint pain, or weakness Hematologic/Oncology: Negative; no easy bruising, bleeding Endocrine: Negative; no heat/cold intolerance; no diabetes Neuro: Negative; no changes in balance, headaches Skin: Negative; No rashes or skin lesions Psychiatric: Negative; No behavioral problems, depression Sleep: Negative; No snoring, daytime sleepiness, hypersomnolence, bruxism, restless legs, hypnogognic hallucinations, no cataplexy Other comprehensive 14 point system review is negative.   PHYSICAL EXAM:    VS:  BP (!) 142/90   Pulse (!) 58   Temp 97.7 F (36.5 C)   Ht 5' 9.5" (1.765 m)   Wt 183 lb 9.6 oz (83.3 kg)   BMI 26.72 kg/m    Wt Readings from Last 3 Encounters:  04/04/19 183 lb 9.6 oz (83.3 kg)  09/11/18 180 lb (81.6 kg)  04/03/18 188 lb 3.2 oz (85.4 kg)    General: Alert, oriented, no distress.  Skin: normal turgor, no rashes, warm and dry HEENT: Normocephalic, atraumatic. Pupils equal round and reactive to light; sclera anicteric; extraocular muscles intact;  Nose without nasal septal hypertrophy Mouth/Parynx benign; Mallinpatti scale Neck: No JVD, no carotid bruits; normal carotid upstroke Lungs: clear to ausculatation and percussion; no wheezing or rales Chest wall: without tenderness to palpitation Heart: PMI not displaced, RRR, s1 s2 normal, 2/6 systolic murmur in the aortic area, no diastolic murmur, no rubs, gallops, thrills, or heaves Abdomen: soft, nontender; no hepatosplenomehaly, BS+; abdominal aorta nontender and not dilated by palpation. Back: no CVA tenderness Pulses 2+ Musculoskeletal: full range of motion, normal strength, no joint deformities Extremities: Probable ganglion cyst on his left wrist;no clubbing cyanosis or edema, Homan's sign negative  Neurologic: grossly nonfocal; Cranial nerves grossly wnl Psychologic: Normal mood and affect   Studies/Labs Reviewed:   EKG:  EKG is  ordered today.  ECG (independently read by me): Sinus bradycardia at 58 bpm.  First-degree AV block with a parable of 256 ms.  LVH by voltage.  QTc interval 402 ms.  December 2019 ECG (independently read by me): Sinus bradycardia 59 bpm with first-degree AV block.  PR interval 278 ms.  LVH by voltage criteria.  March 2019 ECG (independently read by me): Sinus rhythm at 63 bpm.  First-degree AV block.  PAC.  Borderline voltage criteria for LVH.  October 2018 ECG (independently read by me): Sinus bradycardia 58 bpm.  First-degree AV block.  LVH by voltage.  April 2018 ECG  (independently read by me): Sinus bradycardia at 58 bpm.  First degree AV block Borderline LVH  by voltage criteria.  July 2017 ECG (independently read by me): Sinus bradycardia at 53 bpm.  Mild first-degree AV block with a PR interval at 226 ms.  Borderline LVH by voltage criteria.  Mild T wave abnormality in 3 and aVF.  November 2016 ECG (independently read by me): Normal sinus rhythm at 65 bpm.  First-degree AV block with a PR interval at 244 ms.  October 2015 ECG disease independently read by me): Normal sinus rhythm at 57 beats per minute.  Mild first degree AV block.  Prior 07/08/2013 EKG (independently read by the) normal sinus rhythm. Non-specific ST-T changes inferolaterally.  Prior 03/12/13 ECG: Sinus rhythm at 66 beats per minute. First-degree AV block. LVH by voltage criteria. Mild nondiagnostic T changes.  Recent Labs: BMP Latest Ref Rng & Units 03/20/2017 07/08/2013 05/15/2013  Glucose 65 - 99 mg/dL 97 97 130(H)  BUN 8 - 27 mg/dL _0 Creatinine 0.76 - 1.27 mg/dL 1.22 1.02 0.95  BUN/Creat Ratio 10 - 24 19 - -  Sodium 134 - 144 mmol/L 142 139 137  Potassium 3.5 - 5.2 mmol/L 4.8 4.5 4.4  Chloride 96 - 106 mmol/L 105 108 102  CO2 20 - 29 mmol/L _1 Calcium 8.6 - 10.2 mg/dL 9.4 8.7 7.8(L)     Hepatic Function Latest Ref Rng & Units 07/08/2013 05/09/2013 03/12/2013  Total Protein 6.0 - 8.3 g/dL 6.5 7.4 7.0  Albumin 3.5 - 5.2 g/dL 4.1 4.2 4.6  AST 0 - 37 U/L _2 ALT 0 - 53 U/L 21 33 18  Alk Phosphatase 39 - 117 U/L 44 43 39  Total Bilirubin 0.2 - 1.2 mg/dL 0.4 0.5 0.7    CBC Latest Ref Rng & Units 08/19/2013 07/08/2013 05/17/2013  WBC 4.0 - 10.5 K/uL 6.8 6.8 6.7  Hemoglobin 13.0 - 17.0 g/dL 13.4 12.3(L) 9.4(L)  Hematocrit 39.0 - 52.0 % 39.3 36.7(L) 27.3(L)  Platelets 150 - 400 K/uL 170 166 84(L)   Lab Results  Component Value Date   MCV 92.0 08/19/2013   MCV 95.1 07/08/2013   MCV 96.8 05/17/2013   Lab Results  Component Value Date   TSH 1.554  07/08/2013   Lab Results  Component Value Date   HGBA1C 5.5 05/09/2013     BNP No results found for: BNP  ProBNP No results found for: PROBNP   Lipid Panel     Component Value Date/Time   CHOL 122 07/08/2013 0856   TRIG 89 07/08/2013 0856   HDL 42 07/08/2013 0856   CHOLHDL 2.9 07/08/2013 0856   VLDL 18 07/08/2013 0856   LDLCALC 62 07/08/2013 0856     RADIOLOGY: No results found.   Additional studies/ records that were reviewed today include:   ECHO SUMMARY:03/14/2019 Left Ventricle: Normal LV size and function with EF 60-65%. There is mild hypertrophy of the basal septum. Diastolic dysfunction is inderterminant. There is increased LV filling pressures. Right Ventricle: Normal RV size and RVF. Right Atrium: Normal size. Left Atrium: Severely dilated. Mitral Valve: Moderate thickening and calcification of the anterior MV leaflet with moderate mitral annular calcification and trivial MR. Aortic Valve: S/P bioprosthetic AVR that appears to be functioning normally. Aortic valve mean gradient measures 10.0 mmHg. Aortic valve peak gradient measures 18.7 mmHg. Aortic valve area, by VTI measures 1.32 cm. There is no perivalvular AI. Pulmonic Valve: There is mild pulmonic regurgitation. Tricuspid Valve: There is trivial tricuspid regurgitation. Aorta: Mildly dilated ascending aorta at 39m.  FINDINGS:  Left Ventricle: Left ventricular ejection fraction, by visual estimation, is 60 to 65%. The left ventricle has normal function. There is mildly increased left ventricular hypertrophy of the basal septum. Left ventricular diastolic parameters are  indeterminate. Elevated left ventricular end-diastolic pressure.  Right Ventricle: The right ventricular size is normal. No increase in right ventricular wall thickness. Global RV systolic function is has normal systolic function. The tricuspid regurgitant velocity is 2.07 m/s, and with an assumed right atrial pressure  of 3 mmHg,  the estimated right ventricular systolic pressure is normal at 20.2 mmHg.  Left Atrium: Left atrial size was severely dilated.  Right Atrium: Right atrial size was normal in size  Pericardium: There is no evidence of pericardial effusion.  Mitral Valve: The mitral valve is normal in structure. There is moderate thickening of the anterior mitral valve leaflet(s). There is moderate calcification of the anterior mitral valve leaflet(s). Moderate mitral annular calcification. No evidence of  mitral valve stenosis by observation. MV peak gradient, 6.7 mmHg. Trace mitral valve regurgitation.  Tricuspid Valve: The tricuspid valve is normal in structure. Tricuspid valve regurgitation is mild.  Aortic Valve: The aortic valve is normal in structure. Aortic valve regurgitation is not visualized. The aortic valve is structurally normal, with no evidence of sclerosis or stenosis. Aortic valve mean gradient measures 10.0 mmHg. Aortic valve peak  gradient measures 18.7 mmHg. Aortic valve area, by VTI measures 1.32 cm.  Pulmonic Valve: The pulmonic valve was normal in structure. Pulmonic valve regurgitation is mild.  Aorta: Aortic dilatation noted. There is mild dilatation of the ascending aorta measuring 39 mm.  Venous: The inferior vena cava is normal in size with greater than 50% respiratory variability, suggesting right atrial pressure of 3 mmHg.  IAS/Shunts: No atrial level shunt detected by color flow Doppler. No ventricular septal defect is seen or detected. There is no evidence of an atrial septal defect.     LEFT VENTRICLE PLAX 2D LVIDd:         4.12 cm  Diastology LVIDs:         3.08 cm  LV e' lateral:   11.20 cm/s LV PW:         0.97 cm  LV E/e' lateral: 9.1 LV IVS:        1.25 cm  LV e' medial:    5.98 cm/s LVOT diam:     2.00 cm  LV E/e' medial:  17.1 LV SV:         38 ml LV SV Index:   18.76 LVOT Area:     3.14 cm    RIGHT VENTRICLE RV Basal diam:  4.88 cm RV S  prime:     12.50 cm/s TAPSE (M-mode): 2.5 cm RVSP:           20.2 mmHg  LEFT ATRIUM              Index       RIGHT ATRIUM           Index LA diam:        5.00 cm  2.52 cm/m  RA Pressure: 3.00 mmHg LA Vol (A2C):   95.6 ml  48.15 ml/m RA Area:     19.05 cm LA Vol (A4C):   101.0 ml 50.87 ml/m RA Volume:   50.85 ml  25.61 ml/m LA Biplane Vol: 101.0 ml 50.87 ml/m  AORTIC VALVE AV Area (Vmax):    1.22 cm AV Area (Vmean):   1.30 cm AV Area (VTI):  1.32 cm AV Vmax:           216.00 cm/s AV Vmean:          143.000 cm/s AV VTI:            0.516 m AV Peak Grad:      18.7 mmHg AV Mean Grad:      10.0 mmHg LVOT Vmax:         84.20 cm/s LVOT Vmean:        59.000 cm/s LVOT VTI:          0.217 m LVOT/AV VTI ratio: 0.42   AORTA Ao Root diam: 3.50 cm  MITRAL VALVE                         TRICUSPID VALVE MV Area (PHT): 2.80 cm              TR Peak grad:   17.2 mmHg MV Peak grad:  6.7 mmHg              TR Vmax:        263.00 cm/s MV Mean grad:  2.0 mmHg              Estimated RAP:  3.00 mmHg MV Vmax:       1.29 m/s              RVSP:           20.2 mmHg MV Vmean:      67.4 cm/s MV VTI:        0.42 m                SHUNTS MV PHT:        78.59 msec            Systemic VTI:  0.22 m MV Decel Time: 271 msec              Systemic Diam: 2.00 cm MV E velocity: 102.00 cm/s 103 cm/s MV A velocity: 112.00 cm/s 70.3 cm/s MV E/A ratio:  0.91        1.5   ASSESSMENT:    1. Severe aortic stenosis: Status post bioprosthetic aortic valve replacement May 13, 2013   2. Essential hypertension   3. Pure hypercholesterolemia   4. Hiatal hernia with GERD    PLAN:  Drew Andrews  is a 73 year old gentleman who developed severe aortic stenosis and underwent successful AVR with a 25 mm [facility] Ease bovine pericardial tissue valve on May 13, 2013.  He has a history of hypertension and hyperlipidemia.  I reviewed his most recent echo Doppler study of March 14, 2019 and his  bioprosthetic valve remains stable with normal gradients and without evidence for perivalvular leak.  EF was 60 to 65% with mild left ventricular hypertrophy of the basal septum.  He had moderate mitral annular calcification with trivial MR, mild PR and his a sending aorta measured 39 mm.  His blood pressure today is stable on his regimen consisting of lisinopril 20 mg and Toprol-XL 50 mg.  Most recent lipid studies remained stable on generic Vytorin 10/40 with total cholesterol 129, HDL 43, LDL 72, triglycerides 70.  He continues to exercise daily and in the summer months typically is riding his bike.  Now in the colder winter months he does more walking but typically walks 2.2 miles a day for at least 30 minutes.  His hiatal hernia with GERD is controlled  with omeprazole.  He would like to be continued to be seen at 23-monthintervals.  I will see him in 6 months for follow-up evaluation.   Medication Adjustments/Labs and Tests Ordered: Current medicines are reviewed at length with the patient today.  Concerns regarding medicines are outlined above.  Medication changes, Labs and Tests ordered today are listed in the Patient Instructions below. Patient Instructions  Medication Instructions:  Continue current medications  *If you need a refill on your cardiac medications before your next appointment, please call your pharmacy*  Lab Work: None Ordered  Testing/Procedures: None Ordered  Follow-Up: At [facility] you and your health needs are our priority.  As part of our continuing mission to provide you with exceptional heart care, we have created designated Provider Care Teams.  These Care Teams include your primary Cardiologist (physician) and Advanced Practice Providers (APPs -  Physician Assistants and Nurse Practitioners) who all work together to provide you with the care you need, when you need it.  Your next appointment:   6 month(s)  The format for your next appointment:   In Person   Provider:   TShelva Majestic MD  Other Instructions      Signed, TShelva Majestic MD  04/06/2019 10:02 AM    [id number] [place]  [id number]: [phone]

## 2019-04-06 ENCOUNTER — Encounter: Payer: Self-pay | Admitting: Cardiovascular Disease

## 2019-04-21 ENCOUNTER — Other Ambulatory Visit: Payer: Self-pay | Admitting: Cardiovascular Disease

## 2019-04-23 ENCOUNTER — Other Ambulatory Visit: Payer: Self-pay

## 2019-04-23 MED ORDER — LISINOPRIL 20 MG PO TABS: 20.0000 mg | ORAL_TABLET | Freq: Every day | ORAL | 3 refills | Status: DC

## 2019-04-23 MED ORDER — METOPROLOL SUCCINATE ER 50 MG PO TB24: ORAL_TABLET | ORAL | 3 refills | Status: DC

## 2019-04-23 MED ORDER — EZETIMIBE-SIMVASTATIN 10-40 MG PO TABS: 1.0000 | ORAL_TABLET | Freq: Every day | ORAL | 3 refills | Status: DC

## 2019-05-10 DIAGNOSIS — H52201 Unspecified astigmatism, right eye: Secondary | ICD-10-CM | POA: Diagnosis not present

## 2019-05-10 DIAGNOSIS — H5211 Myopia, right eye: Secondary | ICD-10-CM | POA: Diagnosis not present

## 2019-05-10 DIAGNOSIS — H524 Presbyopia: Secondary | ICD-10-CM | POA: Diagnosis not present

## 2019-05-31 ENCOUNTER — Ambulatory Visit: Payer: Self-pay

## 2019-06-03 DIAGNOSIS — I739 Peripheral vascular disease, unspecified: Secondary | ICD-10-CM | POA: Diagnosis not present

## 2019-06-03 DIAGNOSIS — E785 Hyperlipidemia, unspecified: Secondary | ICD-10-CM | POA: Diagnosis not present

## 2019-06-03 DIAGNOSIS — Z87891 Personal history of nicotine dependence: Secondary | ICD-10-CM | POA: Diagnosis not present

## 2019-06-03 DIAGNOSIS — I10 Essential (primary) hypertension: Secondary | ICD-10-CM | POA: Diagnosis not present

## 2019-06-03 DIAGNOSIS — Z008 Encounter for other general examination: Secondary | ICD-10-CM | POA: Diagnosis not present

## 2019-06-03 DIAGNOSIS — K219 Gastro-esophageal reflux disease without esophagitis: Secondary | ICD-10-CM | POA: Diagnosis not present

## 2019-06-03 DIAGNOSIS — R32 Unspecified urinary incontinence: Secondary | ICD-10-CM | POA: Diagnosis not present

## 2019-06-08 ENCOUNTER — Ambulatory Visit: Payer: Medicare HMO | Attending: Internal Medicine

## 2019-06-08 DIAGNOSIS — Z23 Encounter for immunization: Secondary | ICD-10-CM | POA: Insufficient documentation

## 2019-06-08 NOTE — Progress Notes (Signed)
   Covid-19 Vaccination Clinic  Name:  Md Smola    MRN: [id number] DOB: [birth year]  06/08/2019  Mr. Bunyan was observed post Covid-19 immunization for 15 minutes without incidence. He was provided with Vaccine Information Sheet and instruction to access the V-Safe system.   Mr. Martinek was instructed to call 911 with any severe reactions post vaccine: [name] Difficulty breathing  . Swelling of your face and throat  . A fast heartbeat  . A bad rash all over your body  . Dizziness and weakness    Immunizations Administered    Name Date Dose VIS Date Route   Pfizer COVID-19 Vaccine 06/08/2019  3:58 PM 0.3 mL 04/12/2019 Intramuscular   Manufacturer: [employer], [employer]   Lot: [id number]   NDC: [address]-6

## 2019-06-11 ENCOUNTER — Ambulatory Visit: Payer: Self-pay

## 2019-07-03 ENCOUNTER — Ambulatory Visit: Payer: Medicare HMO | Attending: Internal Medicine

## 2019-07-03 DIAGNOSIS — Z23 Encounter for immunization: Secondary | ICD-10-CM | POA: Insufficient documentation

## 2019-07-03 NOTE — Progress Notes (Signed)
   Covid-19 Vaccination Clinic  Name:  Drew Andrews    MRN: [id number] DOB: Jul 11, 1945  07/03/2019  [name] was observed post Covid-19 immunization for 15 minutes without incident. He was provided with Vaccine Information Sheet and instruction to access the V-Safe system.   [name] was instructed to call 911 with any severe reactions post vaccine: [name] Difficulty breathing  . Swelling of face and throat  . A fast heartbeat  . A bad rash all over body  . Dizziness and weakness   Immunizations Administered    Name Date Dose VIS Date Route   Pfizer COVID-19 Vaccine 07/03/2019 12:21 PM 0.3 mL 04/12/2019 Intramuscular   Manufacturer: [employer], [employer]   Lot: [id number]   NDC: [address]-6

## 2019-11-28 ENCOUNTER — Ambulatory Visit: Payer: Medicare HMO | Admitting: Cardiovascular Disease

## 2019-11-28 ENCOUNTER — Encounter: Payer: Self-pay | Admitting: Cardiovascular Disease

## 2019-11-28 ENCOUNTER — Other Ambulatory Visit: Payer: Self-pay

## 2019-11-28 DIAGNOSIS — Z952 Presence of prosthetic heart valve: Secondary | ICD-10-CM

## 2019-11-28 DIAGNOSIS — K449 Diaphragmatic hernia without obstruction or gangrene: Secondary | ICD-10-CM | POA: Diagnosis not present

## 2019-11-28 DIAGNOSIS — K219 Gastro-esophageal reflux disease without esophagitis: Secondary | ICD-10-CM | POA: Diagnosis not present

## 2019-11-28 DIAGNOSIS — I10 Essential (primary) hypertension: Secondary | ICD-10-CM

## 2019-11-28 DIAGNOSIS — E7800 Pure hypercholesterolemia, unspecified: Secondary | ICD-10-CM | POA: Diagnosis not present

## 2019-11-28 DIAGNOSIS — I350 Nonrheumatic aortic (valve) stenosis: Secondary | ICD-10-CM | POA: Diagnosis not present

## 2019-11-28 MED ORDER — LISINOPRIL 30 MG PO TABS: 30.0000 mg | ORAL_TABLET | Freq: Every day | ORAL | 3 refills | Status: DC

## 2019-11-28 NOTE — Patient Instructions (Signed)
Medication Instructions:  INCREASE YOUR LISINOPRIL TO 30MG  DAILY  *If you need a refill on your cardiac medications before your next appointment, please call your pharmacy*     Testing/Procedures: Your physician has requested that you have a lower  extremity arterial duplex. This test is an ultrasound of the arteries in the legs . It looks at arterial blood flow in the legs . Allow one hour for Lower Arterial scans. There are no restrictions or special instructions    Follow-Up: At [facility], you and your health needs are our priority.  As part of our continuing mission to provide you with exceptional heart care, we have created designated Provider Care Teams.  These Care Teams include your primary Cardiologist (physician) and Advanced Practice Providers (APPs -  Physician Assistants and Nurse Practitioners) who all work together to provide you with the care you need, when you need it.  We recommend signing up for the patient portal called "MyChart".  Sign up information is provided on this After Visit Summary.  MyChart is used to connect with patients for Virtual Visits (Telemedicine).  Patients are able to view lab/test results, encounter notes, upcoming appointments, etc.  Non-urgent messages can be sent to your provider as well.   To learn more about what you can do with MyChart, go to [facility].    Your next appointment:   6 month(s)  The format for your next appointment:   In Person  Provider:   [email or web address], MD

## 2019-11-28 NOTE — Progress Notes (Signed)
Cardiology Office Note    Date:  11/30/2019   ID:  Drew Andrews, DOB [birth year], MRN [id number]  PCP:  Drew Pao, MD  Cardiologist:  Drew Majestic, MD   28-monthfollow-up  History of Present Illness:  Drew Andrews a 74y.o. male who was first told of having a heart murmur back in the 1980s when he was planning to work at [employer] had a physical exam done at that time. I have seen him since 2005 when he was referred to me by Dr. MEarlean Shawlfor cardiac murmur. Since 2005 he had undergone follow-up echo Doppler studies to assess his aortic valve. In 2005 his aortic transvalvular gradient was 37 with a mean gradient of 18. Over the last 9 years, his aortic valve murmur gradually become more significant and he remained asymptomatic. An echo in 11/03/2014showed normal LV function with grade 2 diastolic dysfunction. His aortic valve again was moderately calcified. The mean gradient had increased to 47 and peak instantaneous gradient 86 giving a calculated aortic valve area of 0.63 cm. He did have mitral annular calcification. His left atrium was moderately dilated. He did have very mild RV dilatation.  When I saw him in November 2014 Drew Andrews was continuing to exercise but there was new development of exertional shortness of breath. At that time, I strongly recommended cardiac catheterization and he underwent right and left heart cardiac catheterization on 03/15/2013. This confirmed severe aortic valve stenosis mild pulmonary hypertension. He had mild chronic calcification but nonobstructive 20% narrowing in the LAD.   He underwent successful aortic valve replacement surgery by Dr. BGilford Raid01/04/2014 and [facility]. He has done remarkably well following his valve replacement surgery. He denies any episodes of palpitations. He has resumed activity.  Since his valve replacement, he has  continued to feel well. He denies any shortness of breath or chest pain. He does exercise regularly. Typically in the cold winter months. He likes to exercise at cardiac rehabilitation for the months of January and February.. However, he has had several rare occurrences of diplopia it are short lived. He has been taking aspirin 81 mg daily. A repeat echo Doppler study on 12/23/2013.revealed an EF 55-60% with mild LVH. His aortic prosthesis was well-seated and open well. There was only trivial aortic insufficiency. He did have mild dilatation of his left atrium and mitral annular calcification. There also was some dilatation of his RV.   His echo in October 2016 continued to show excellent LV function with an EF of 55-60% and he had normal diastolic parameters. The bioprosthetic AV was functioning normally. The gradients were felt to be very minimally elevated and unchanged from his previous study with a mean gradient of 11 and peak gradient of 23 mm. There was no evidence for aortic insufficiency. There was evidence for mitral annular calcification and mild LA dilatation.  He underwent a 2-D echo Doppler study on 08/02/2016. He had normal LV function with EF of 60-65% and grade 2 diastolic dysfunction. His aortic bioprosthesis was well-seated and was functioning well. The mean gradient was 11 and peak gradient 20. He had mild MR. There was mild TR and mild LA dilatation.   He participates the maintenance phase of cardiac rehabilitation over the winter months and will be ending this week. Typically over the summer he rides his bike. On days he does not do cardiac rehabilitation in the winter he often walks  and if the weather is nice. He may ride his bike occasionally. He typically gained some weight over the winter months but loses weight over the summer when he is more active outside. He denies any chest pain, presyncope or syncope, palpitations, or change in exercise tolerance.  Recently, he has noticed his blood pressure being slightly elevated in the 130s to 140s when he arrives at cardiac rehabilitation after exercise his blood pressure is normal. He has increased his lisinopril to 7.5 mg.   When Isaw him in October 2018, he denied any episodes of chest pain or shortness of breath. He was exercising and walks for at least 30 minutes or bikes 7 days per week. Over the winter months, he again participated in the cardiac rehabilitation program. His blood pressure was mildly elevated and I titrated lisinopril to 15 mg daily. He continues to take metoprolol succinate 50 mg. He has been on generic Vytorin 10/40 for hyperlipidemia.   He underwent a follow-up echo Doppler study on January 08, 2018. This showed normal systolic function with grade 2 diastolic dysfunction. EF was 55 to 60%. His aortic bioprosthesis was well-seated. Mean gradient was 12. There was no AI. His a sending aorta was mildly dilated. There was mild MR and mild to moderate biatrial enlargement. He continues to be asymptomatic and feels well. He is enjoying retirement.   Since I saw him in December 2019, he again participated in the cardiac rehabilitation program over the winter months.  He was now back riding his bike at least 30 minutes a day and also on days that he does not ride his bike he typically walks at a fast pace.    I  evaluated him in a telemedicine visit on Sep 11, 2018 which time he continued to be stable and denied any chest pain, PND, or orthopnea.  He was taking  lisinopril 20 mg and Toprol-XL 50 mg for hypertension.  His blood pressure has been well controlled and typically when he leaves cardiac rehab it would be approximately 120/70.  He continues to be on Vytorin 10/40 for hyperlipidemia.  He has a hiatal hernia and GERD which is well controlled with omeprazole.  His laboratory typically is done by Dr. Osborne Andrews.  I last saw him in December 2020 and since his prior  evaluation he had remained stable.  He was continuing  to ride his bike but in the winter months seems to walk more due to the weather.  Typically walks for 30 minutes for 2.2 miles.  He underwent a follow-up echo Doppler study on March 14, 2019.  EF remains 60 to 65% with mild LVH of the basal septum.  Diastolic function was indeterminate.  Had a stable bioprosthetic aortic valve with normal gradients with a mean gradient of 10 and peak gradient of 18.7.  There was no perivalvular aortic insufficiency.  He had mild pulmonic regurgitation.  His ascending aorta measured 39 mm.  He has continued to be on generic Vytorin 10/40 and lipid studies in August 2020 showed a total cholesterol 129, HDL 43, LDL 72, and triglycerides 70.  He continues to be on Toprol-XL 50 mg and lisinopril 20 mg daily.  GERD is controlled with omeprazole.    Since his last evaluation, he either walks or bikes on a daily basis.  Due to the Covid pandemic he did not do cardiac rehab over the winter.  He had recently had an insurance person come to the house.  Apparently they had done ABIs and  noticed his right ABI was reduced at 0.85 and left ABI was 1.24.  They were concerned about possible peripheral vascular disease.  He sees Dr. Osborne Andrews who has checked laboratory.  He continues to be on generic Vytorin 10/40, lisinopril 20 mg daily, metoprolol succinate 50 mg daily and takes omeprazole over-the-counter daily.  He presents for evaluation.   Past Medical History:  Diagnosis Date  . Bursitis of elbow 05/20/2013  . Essential hypertension 03/12/2013  . GERD (gastroesophageal reflux disease)   . Hyperlipidemia   . Hypertension   . Left ventricular diastolic dysfunction, NYHA class 2 03/12/2013  . LVH (left ventricular hypertrophy)    with aortic stenosis-bicuspid  . PONV (postoperative nausea and vomiting)    as a child  . S/P AVR (aortic valve replacement) 05/13/2013  . S/P AVR, 05/13/13, 25 mm [facility]a-Ease pericardial  valve 05/13/2013  . Seasonal allergies   . Severe aortic stenosis 03/12/2013    Past Surgical History:  Procedure Laterality Date  . AORTIC VALVE REPLACEMENT N/A 05/13/2013   Procedure: AORTIC VALVE REPLACEMENT (AVR);  Surgeon: [name], MD;  Location: [place];  Service: Open Heart Surgery;  Laterality: N/A;  . APPENDECTOMY  age 85  . CARDIAC CATHETERIZATION    . COLONOSCOPY     X 2  . HIATAL HERNIA REPAIR    . INTRAOPERATIVE TRANSESOPHAGEAL ECHOCARDIOGRAM N/A 05/13/2013   Procedure: INTRAOPERATIVE TRANSESOPHAGEAL ECHOCARDIOGRAM;  Surgeon: [name], MD;  Location: [facility] OR;  Service: Open Heart Surgery;  Laterality: N/A;  . LEFT HEART CATHETERIZATION WITH CORONARY ANGIOGRAM N/A 03/15/2013   Procedure: LEFT HEART CATHETERIZATION WITH CORONARY ANGIOGRAM;  Surgeon: [name], MD;  Location: [facility] CATH LAB;  Service: Cardiovascular;  Laterality: N/A;  . TONSILLECTOMY    . TRANSTHORACIC ECHOCARDIOGRAM  03/04/2013   EF 03-47%, grade 2 diastolic dysfunction, AV with mod calcified leaflets & mild regurg, calcified MV annulus, LA mod dilated, RV mildly dilated    Current Medications: Outpatient Medications Prior to Visit  Medication Sig Dispense Refill  . amoxicillin (AMOXIL) 500 MG tablet Take 4 tablets 1 hour prior to dental procedure 8 tablet 1  . aspirin 81 MG tablet Take 81 mg by mouth daily.    [name] ezetimibe-simvastatin (VYTORIN) 10-40 MG tablet Take 1 tablet by mouth at bedtime. 90 tablet 3  . Ibuprofen-Diphenhydramine Cit (ADVIL PM PO) Take 2-3 tablets by mouth at bedtime as needed (for sleep).     . metoprolol succinate (TOPROL-XL) 50 MG 24 hr tablet TAKE 1 TABLET BY MOUTH  DAILY WITH OR IMMEDIATLEY  FOLLOWING A MEAL 90 tablet 3  . omeprazole (PRILOSEC OTC) 20 MG tablet Take 20 mg by mouth daily.     [name] lisinopril (ZESTRIL) 20 MG tablet Take 1 tablet (20 mg total) by mouth daily. 90 tablet 3  . OVER THE COUNTER MEDICATION Patient takes iron tablet daily.     No  facility-administered medications prior to visit.     Allergies:   Scopolamine   Social History   Socioeconomic History  . Marital status: Married    Spouse name: Not on file  . Number of children: 1  . Years of education: college  . Highest education level: Not on file  Occupational History  . Not on file  Tobacco Use  . Smoking status: Former Smoker    Packs/day: 1.00    Quit date: 02/10/1974    Years since quitting: 45.8  . Smokeless tobacco: Never Used  Substance and Sexual Activity  .  Alcohol use: Yes    Comment: Drinks 1 bottle of wine daily and 1 beer  . Drug use: No  . Sexual activity: Not on file  Other Topics Concern  . Not on file  Social History Narrative  . Not on file   Social Determinants of Health   Financial Resource Strain:   . Difficulty of Paying Living Expenses:   Food Insecurity:   . Worried About Charity fundraiser in the Last Year:   . Arboriculturist in the Last Year:   Transportation Needs:   . Film/video editor (Medical):   [name] Lack of Transportation (Non-Medical):   Physical Activity:   . Days of Exercise per Week:   . Minutes of Exercise per Session:   Stress:   . Feeling of Stress :   Social Connections:   . Frequency of Communication with Friends and Family:   . Frequency of Social Gatherings with Friends and Family:   . Attends Religious Services:   . Active Member of Clubs or Organizations:   . Attends Archivist Meetings:   [name] Marital Status:      Additional social history is notable in that he has a Paediatric nurse in IT sales professional. He is retired from [employer]. He quit tobacco in 1979. He continues to exercise 7 days per week.  Family History:  The patient's family history includes Acute myelogenous leukemia in his brother; Diabetes in his mother; Heart attack in his father and maternal grandfather; Hyperlipidemia in his mother; Hypertension in his mother; Pneumonia in his maternal  grandmother.   ROS General: Negative; No fevers, chills, or night sweats;  HEENT: Negative; No changes in vision or hearing, sinus congestion, difficulty swallowing Pulmonary: Negative; No cough, wheezing, shortness of breath, hemoptysis Cardiovascular: Negative; No chest pain, presyncope, syncope, palpitations GI: Negative; No nausea, vomiting, diarrhea, or abdominal pain GU: Negative; No dysuria, hematuria, or difficulty voiding Musculoskeletal: Negative; no myalgias, joint pain, or weakness Hematologic/Oncology: Negative; no easy bruising, bleeding Endocrine: Negative; no heat/cold intolerance; no diabetes Neuro: Negative; no changes in balance, headaches Skin: Negative; No rashes or skin lesions Psychiatric: Negative; No behavioral problems, depression Sleep: Negative; No snoring, daytime sleepiness, hypersomnolence, bruxism, restless legs, hypnogognic hallucinations, no cataplexy Other comprehensive 14 point system review is negative.   PHYSICAL EXAM:   VS:  BP 124/80 (BP Location: Left Arm, Patient Position: Sitting, Cuff Size: Normal)   Pulse 54   Ht _0  (1.753 m)   Wt 184 lb 12.8 oz (83.8 kg)   BMI 27.29 kg/m     Repeat blood pressure by me was elevated and 168/84 in the right and 160/80 in the left.  Wt Readings from Last 3 Encounters:  11/28/19 184 lb 12.8 oz (83.8 kg)  04/04/19 183 lb 9.6 oz (83.3 kg)  09/11/18 180 lb (81.6 kg)    General: Alert, oriented, no distress.  Skin: normal turgor, no rashes, warm and dry HEENT: Normocephalic, atraumatic. Pupils equal round and reactive to light; sclera anicteric; extraocular muscles intact;  Nose without nasal septal hypertrophy Mouth/Parynx benign; Mallinpatti scale 3 Neck: No JVD, no carotid bruits; normal carotid upstroke Lungs: clear to ausculatation and percussion; no wheezing or rales Chest wall: without tenderness to palpitation Heart: PMI not displaced, RRR, s1 s2 normal, 1/6 systolic murmur, no diastolic  murmur, no rubs, gallops, thrills, or heaves Abdomen: soft, nontender; no hepatosplenomehaly, BS+; abdominal aorta nontender and not dilated by palpation. Back: no CVA tenderness Pulses 2+;  distal pulses seemed adequate Musculoskeletal: full range of motion, normal strength, no joint deformities Extremities: no clubbing cyanosis or edema, Homan's sign negative  Neurologic: grossly nonfocal; Cranial nerves grossly wnl Psychologic: Normal mood and affect   Studies/Labs Reviewed:   EKG:  EKG is  ordered today.  ECG (independently read by me): Sinus bradycardia at 54 bpm with first-degree AV block; PR interval 282 ms.  LVH by voltage criteria in aVL.  December 2020 ECG (independently read by me): Sinus bradycardia at 58 bpm.  First-degree AV block with a parable of 256 ms.  LVH by voltage.  QTc interval 402 ms.  December 2019 ECG (independently read by me): Sinus bradycardia 59 bpm with first-degree AV block.  PR interval 278 ms.  LVH by voltage criteria.  March 2019 ECG (independently read by me): Sinus rhythm at 63 bpm.  First-degree AV block.  PAC.  Borderline voltage criteria for LVH.  October 2018 ECG (independently read by me): Sinus bradycardia 58 bpm.  First-degree AV block.  LVH by voltage.  April 2018 ECG (independently read by me): Sinus bradycardia at 58 bpm.  First degree AV block Borderline LVH by voltage criteria.  July 2017 ECG (independently read by me): Sinus bradycardia at 53 bpm.  Mild first-degree AV block with a PR interval at 226 ms.  Borderline LVH by voltage criteria.  Mild T wave abnormality in 3 and aVF.  November 2016 ECG (independently read by me): Normal sinus rhythm at 65 bpm.  First-degree AV block with a PR interval at 244 ms.  October 2015 ECG disease independently read by me): Normal sinus rhythm at 57 beats per minute.  Mild first degree AV block.  Prior 07/08/2013 EKG (independently read by the) normal sinus rhythm. Non-specific ST-T changes  inferolaterally.  Prior 03/12/13 ECG: Sinus rhythm at 66 beats per minute. First-degree AV block. LVH by voltage criteria. Mild nondiagnostic T changes.  Recent Labs: BMP Latest Ref Rng & Units 03/20/2017 07/08/2013 05/15/2013  Glucose 65 - 99 mg/dL 97 97 130(H)  BUN 8 - 27 mg/dL _0 Creatinine 0.76 - 1.27 mg/dL 1.22 1.02 0.95  BUN/Creat Ratio 10 - 24 19 - -  Sodium 134 - 144 mmol/L 142 139 137  Potassium 3.5 - 5.2 mmol/L 4.8 4.5 4.4  Chloride 96 - 106 mmol/L 105 108 102  CO2 20 - 29 mmol/L _1 Calcium 8.6 - 10.2 mg/dL 9.4 8.7 7.8(L)     Hepatic Function Latest Ref Rng & Units 07/08/2013 05/09/2013 03/12/2013  Total Protein 6.0 - 8.3 g/dL 6.5 7.4 7.0  Albumin 3.5 - 5.2 g/dL 4.1 4.2 4.6  AST 0 - 37 U/L _2 ALT 0 - 53 U/L 21 33 18  Alk Phosphatase 39 - 117 U/L 44 43 39  Total Bilirubin 0.2 - 1.2 mg/dL 0.4 0.5 0.7    CBC Latest Ref Rng & Units 08/19/2013 07/08/2013 05/17/2013  WBC 4.0 - 10.5 K/uL 6.8 6.8 6.7  Hemoglobin 13.0 - 17.0 g/dL 13.4 12.3(L) 9.4(L)  Hematocrit 39 - 52 % 39.3 36.7(L) 27.3(L)  Platelets 150 - 400 K/uL 170 166 84(L)   Lab Results  Component Value Date   MCV 92.0 08/19/2013   MCV 95.1 07/08/2013   MCV 96.8 05/17/2013   Lab Results  Component Value Date   TSH 1.554 07/08/2013   Lab Results  Component Value Date   HGBA1C 5.5 05/09/2013     BNP No results found for: BNP  ProBNP  No results found for: PROBNP   Lipid Panel     Component Value Date/Time   CHOL 122 07/08/2013 0856   TRIG 89 07/08/2013 0856   HDL 42 07/08/2013 0856   CHOLHDL 2.9 07/08/2013 0856   VLDL 18 07/08/2013 0856   LDLCALC 62 07/08/2013 0856     RADIOLOGY: No results found.   Additional studies/ records that were reviewed today include:   ECHO SUMMARY:03/14/2019 Left Ventricle: Normal LV size and function with EF 60-65%. There is mild hypertrophy of the basal septum. Diastolic dysfunction is inderterminant. There is increased LV filling  pressures. Right Ventricle: Normal RV size and RVF. Right Atrium: Normal size. Left Atrium: Severely dilated. Mitral Valve: Moderate thickening and calcification of the anterior MV leaflet with moderate mitral annular calcification and trivial MR. Aortic Valve: S/P bioprosthetic AVR that appears to be functioning normally. Aortic valve mean gradient measures 10.0 mmHg. Aortic valve peak gradient measures 18.7 mmHg. Aortic valve area, by VTI measures 1.32 cm. There is no perivalvular AI. Pulmonic Valve: There is mild pulmonic regurgitation. Tricuspid Valve: There is trivial tricuspid regurgitation. Aorta: Mildly dilated ascending aorta at 35m.  FINDINGS:  Left Ventricle: Left ventricular ejection fraction, by visual estimation, is 60 to 65%. The left ventricle has normal function. There is mildly increased left ventricular hypertrophy of the basal septum. Left ventricular diastolic parameters are  indeterminate. Elevated left ventricular end-diastolic pressure.  Right Ventricle: The right ventricular size is normal. No increase in right ventricular wall thickness. Global RV systolic function is has normal systolic function. The tricuspid regurgitant velocity is 2.07 m/s, and with an assumed right atrial pressure  of 3 mmHg, the estimated right ventricular systolic pressure is normal at 20.2 mmHg.  Left Atrium: Left atrial size was severely dilated.  Right Atrium: Right atrial size was normal in size  Pericardium: There is no evidence of pericardial effusion.  Mitral Valve: The mitral valve is normal in structure. There is moderate thickening of the anterior mitral valve leaflet(s). There is moderate calcification of the anterior mitral valve leaflet(s). Moderate mitral annular calcification. No evidence of  mitral valve stenosis by observation. MV peak gradient, 6.7 mmHg. Trace mitral valve regurgitation.  Tricuspid Valve: The tricuspid valve is normal in structure. Tricuspid  valve regurgitation is mild.  Aortic Valve: The aortic valve is normal in structure. Aortic valve regurgitation is not visualized. The aortic valve is structurally normal, with no evidence of sclerosis or stenosis. Aortic valve mean gradient measures 10.0 mmHg. Aortic valve peak  gradient measures 18.7 mmHg. Aortic valve area, by VTI measures 1.32 cm.  Pulmonic Valve: The pulmonic valve was normal in structure. Pulmonic valve regurgitation is mild.  Aorta: Aortic dilatation noted. There is mild dilatation of the ascending aorta measuring 39 mm.  Venous: The inferior vena cava is normal in size with greater than 50% respiratory variability, suggesting right atrial pressure of 3 mmHg.  IAS/Shunts: No atrial level shunt detected by color flow Doppler. No ventricular septal defect is seen or detected. There is no evidence of an atrial septal defect.     LEFT VENTRICLE PLAX 2D LVIDd:         4.12 cm  Diastology LVIDs:         3.08 cm  LV e' lateral:   11.20 cm/s LV PW:         0.97 cm  LV E/e' lateral: 9.1 LV IVS:        1.25 cm  LV e' medial:  5.98 cm/s LVOT diam:     2.00 cm  LV E/e' medial:  17.1 LV SV:         38 ml LV SV Index:   18.76 LVOT Area:     3.14 cm    RIGHT VENTRICLE RV Basal diam:  4.88 cm RV S prime:     12.50 cm/s TAPSE (M-mode): 2.5 cm RVSP:           20.2 mmHg  LEFT ATRIUM              Index       RIGHT ATRIUM           Index LA diam:        5.00 cm  2.52 cm/m  RA Pressure: 3.00 mmHg LA Vol (A2C):   95.6 ml  48.15 ml/m RA Area:     19.05 cm LA Vol (A4C):   101.0 ml 50.87 ml/m RA Volume:   50.85 ml  25.61 ml/m LA Biplane Vol: 101.0 ml 50.87 ml/m  AORTIC VALVE AV Area (Vmax):    1.22 cm AV Area (Vmean):   1.30 cm AV Area (VTI):     1.32 cm AV Vmax:           216.00 cm/s AV Vmean:          143.000 cm/s AV VTI:            0.516 m AV Peak Grad:      18.7 mmHg AV Mean Grad:      10.0 mmHg LVOT Vmax:         84.20 cm/s LVOT Vmean:         59.000 cm/s LVOT VTI:          0.217 m LVOT/AV VTI ratio: 0.42   AORTA Ao Root diam: 3.50 cm  MITRAL VALVE                         TRICUSPID VALVE MV Area (PHT): 2.80 cm              TR Peak grad:   17.2 mmHg MV Peak grad:  6.7 mmHg              TR Vmax:        263.00 cm/s MV Mean grad:  2.0 mmHg              Estimated RAP:  3.00 mmHg MV Vmax:       1.29 m/s              RVSP:           20.2 mmHg MV Vmean:      67.4 cm/s MV VTI:        0.42 m                SHUNTS MV PHT:        78.59 msec            Systemic VTI:  0.22 m MV Decel Time: 271 msec              Systemic Diam: 2.00 cm MV E velocity: 102.00 cm/s 103 cm/s MV A velocity: 112.00 cm/s 70.3 cm/s MV E/A ratio:  0.91        1.5   ASSESSMENT:    1. Severe aortic stenosis: Bioprothetic AVR 05/13/2013   2. S/P aortic valve replacement   3. Essential hypertension   4. Pure  hypercholesterolemia   5. Hiatal hernia with GERD   6. GERD without esophagitis    PLAN:  Drew Andrews  is a 74 year old gentleman who developed severe aortic stenosis and underwent successful AVR with a 25 mm [facility] Ease bovine pericardial tissue valve on May 13, 2013.  He has a history of hypertension and hyperlipidemia.  On his most recent echo Doppler study of March 14, 2019 his bioprosthetic valve remained stable with normal gradients and without evidence for perivalvular leak.  EF was 60 to 65% with mild left ventricular hypertrophy of the basal septum.  He had moderate mitral annular calcification with trivial MR, mild PR and his ascending aorta measured 39 mm.   His last lipid studies remained stable on generic Vytorin 10/40 with total cholesterol 129, HDL 43, LDL 72, triglycerides 70.  He has continued to be active and either walks or rides his bike on a daily basis.  Recently, he has noticed at home his blood pressure has been elevated in the 140s to 150s.  Repeat blood pressure by me showed further elevation with systolic pressures in  the 160s in both arms.  He is scheduled to undergo follow-up laboratory by Dr. Osborne Andrews.  Currently has been on lisinopril 20 mg daily in addition to metoprolol succinate 50 mg for blood pressure control.  He is bradycardic with pulse at 54.  I am recommending titration of lisinopril to 30 mg daily for more optimal blood pressure control. I updated him on the most recent hypertensive guidelines with target blood pressure less than 130/80 and preferably less than 120/80.  With his recent ABIs with right at 0.85 and left 1.24 which was done by his insurance and a home evaluation, I have recommended he undergo lower extremity arterial Doppler studies for further evaluation. However upon questioning he denies any claudication symptoms. His GERD is controlled on omeprazole. I will see him in 6 months for reevaluation.   Medication Adjustments/Labs and Tests Ordered: Current medicines are reviewed at length with the patient today.  Concerns regarding medicines are outlined above.  Medication changes, Labs and Tests ordered today are listed in the Patient Instructions below. Patient Instructions  Medication Instructions:  INCREASE YOUR LISINOPRIL TO 30MG DAILY  *If you need a refill on your cardiac medications before your next appointment, please call your pharmacy*     Testing/Procedures: Your physician has requested that you have a lower  extremity arterial duplex. This test is an ultrasound of the arteries in the legs . It looks at arterial blood flow in the legs . Allow one hour for Lower Arterial scans. There are no restrictions or special instructions    Follow-Up: At [facility], you and your health needs are our priority.  As part of our continuing mission to provide you with exceptional heart care, we have created designated Provider Care Teams.  These Care Teams include your primary Cardiologist (physician) and Advanced Practice Providers (APPs -  Physician Assistants and Nurse  Practitioners) who all work together to provide you with the care you need, when you need it.  We recommend signing up for the patient portal called "MyChart".  Sign up information is provided on this After Visit Summary.  MyChart is used to connect with patients for Virtual Visits (Telemedicine).  Patients are able to view lab/test results, encounter notes, upcoming appointments, etc.  Non-urgent messages can be sent to your provider as well.   To learn more about what you can do with MyChart, go to  [email or web address].    Your next appointment:   6 month(s)  The format for your next appointment:   In Person  Provider:   Shelva Majestic, MD        Signed, Drew Majestic, MD  11/30/2019 3:47 PM    [facility] [address], [place], [place], [place]  [address] Phone: [phone]

## 2019-11-30 ENCOUNTER — Encounter: Payer: Self-pay | Admitting: Cardiovascular Disease

## 2019-12-06 ENCOUNTER — Other Ambulatory Visit: Payer: Self-pay

## 2019-12-06 MED ORDER — LISINOPRIL 30 MG PO TABS: 30.0000 mg | ORAL_TABLET | Freq: Every day | ORAL | 3 refills | Status: DC

## 2019-12-06 NOTE — Telephone Encounter (Signed)
Received a letter from pt stating he never received his prescription and when he called his pharmacy they stated they did not have a prescription on file for him. Called pt and notified it was sent to [facility]. Pt states it needed to be sent to [facility]. This nurse apologized for the confusion and will send it to the preferred pharmacy. Pt states that he has plenty of medication and won't be running out. No other questions from pt at this time.

## 2019-12-27 DIAGNOSIS — E7800 Pure hypercholesterolemia, unspecified: Secondary | ICD-10-CM | POA: Diagnosis not present

## 2019-12-27 DIAGNOSIS — Z125 Encounter for screening for malignant neoplasm of prostate: Secondary | ICD-10-CM | POA: Diagnosis not present

## 2019-12-30 ENCOUNTER — Other Ambulatory Visit: Payer: Self-pay | Admitting: Cardiovascular Disease

## 2019-12-30 DIAGNOSIS — E7800 Pure hypercholesterolemia, unspecified: Secondary | ICD-10-CM

## 2019-12-30 DIAGNOSIS — K449 Diaphragmatic hernia without obstruction or gangrene: Secondary | ICD-10-CM

## 2019-12-30 DIAGNOSIS — K219 Gastro-esophageal reflux disease without esophagitis: Secondary | ICD-10-CM

## 2019-12-30 DIAGNOSIS — Z952 Presence of prosthetic heart valve: Secondary | ICD-10-CM

## 2019-12-30 DIAGNOSIS — I10 Essential (primary) hypertension: Secondary | ICD-10-CM

## 2020-01-03 DIAGNOSIS — I130 Hypertensive heart and chronic kidney disease with heart failure and stage 1 through stage 4 chronic kidney disease, or unspecified chronic kidney disease: Secondary | ICD-10-CM | POA: Diagnosis not present

## 2020-01-03 DIAGNOSIS — K219 Gastro-esophageal reflux disease without esophagitis: Secondary | ICD-10-CM | POA: Diagnosis not present

## 2020-01-03 DIAGNOSIS — Z952 Presence of prosthetic heart valve: Secondary | ICD-10-CM | POA: Diagnosis not present

## 2020-01-03 DIAGNOSIS — E663 Overweight: Secondary | ICD-10-CM | POA: Diagnosis not present

## 2020-01-03 DIAGNOSIS — Z0000 Encounter for general adult medical examination without abnormal findings: Secondary | ICD-10-CM | POA: Diagnosis not present

## 2020-01-03 DIAGNOSIS — M479 Spondylosis, unspecified: Secondary | ICD-10-CM | POA: Diagnosis not present

## 2020-01-03 DIAGNOSIS — R82998 Other abnormal findings in urine: Secondary | ICD-10-CM | POA: Diagnosis not present

## 2020-01-03 DIAGNOSIS — I350 Nonrheumatic aortic (valve) stenosis: Secondary | ICD-10-CM | POA: Diagnosis not present

## 2020-01-03 DIAGNOSIS — E7800 Pure hypercholesterolemia, unspecified: Secondary | ICD-10-CM | POA: Diagnosis not present

## 2020-01-03 DIAGNOSIS — N529 Male erectile dysfunction, unspecified: Secondary | ICD-10-CM | POA: Diagnosis not present

## 2020-01-03 DIAGNOSIS — N1831 Chronic kidney disease, stage 3a: Secondary | ICD-10-CM | POA: Diagnosis not present

## 2020-01-03 DIAGNOSIS — I517 Cardiomegaly: Secondary | ICD-10-CM | POA: Diagnosis not present

## 2020-01-13 ENCOUNTER — Other Ambulatory Visit: Payer: Self-pay

## 2020-01-13 ENCOUNTER — Ambulatory Visit (HOSPITAL_COMMUNITY)
Admission: RE | Admit: 2020-01-13 | Discharge: 2020-01-13 | Disposition: A | Discharge status: Home / Self Care | Payer: Medicare HMO | Source: Ambulatory Visit | Attending: Cardiology | Admitting: Cardiology

## 2020-01-13 ENCOUNTER — Other Ambulatory Visit: Payer: Self-pay | Admitting: Cardiovascular Disease

## 2020-01-13 DIAGNOSIS — I10 Essential (primary) hypertension: Secondary | ICD-10-CM

## 2020-01-13 DIAGNOSIS — I739 Peripheral vascular disease, unspecified: Secondary | ICD-10-CM

## 2020-01-13 DIAGNOSIS — K219 Gastro-esophageal reflux disease without esophagitis: Secondary | ICD-10-CM | POA: Diagnosis not present

## 2020-01-13 DIAGNOSIS — K449 Diaphragmatic hernia without obstruction or gangrene: Secondary | ICD-10-CM | POA: Insufficient documentation

## 2020-01-13 DIAGNOSIS — Z952 Presence of prosthetic heart valve: Secondary | ICD-10-CM | POA: Diagnosis not present

## 2020-01-13 DIAGNOSIS — E7800 Pure hypercholesterolemia, unspecified: Secondary | ICD-10-CM | POA: Diagnosis not present

## 2020-01-29 DIAGNOSIS — S01111A Laceration without foreign body of right eyelid and periocular area, initial encounter: Secondary | ICD-10-CM | POA: Diagnosis not present

## 2020-01-29 DIAGNOSIS — S61112A Laceration without foreign body of left thumb with damage to nail, initial encounter: Secondary | ICD-10-CM | POA: Diagnosis not present

## 2020-01-30 DIAGNOSIS — R69 Illness, unspecified: Secondary | ICD-10-CM | POA: Diagnosis not present

## 2020-01-30 DIAGNOSIS — Z23 Encounter for immunization: Secondary | ICD-10-CM | POA: Diagnosis not present

## 2020-02-12 DIAGNOSIS — I130 Hypertensive heart and chronic kidney disease with heart failure and stage 1 through stage 4 chronic kidney disease, or unspecified chronic kidney disease: Secondary | ICD-10-CM | POA: Diagnosis not present

## 2020-02-12 DIAGNOSIS — S0011XA Contusion of right eyelid and periocular area, initial encounter: Secondary | ICD-10-CM | POA: Diagnosis not present

## 2020-02-12 DIAGNOSIS — W19XXXA Unspecified fall, initial encounter: Secondary | ICD-10-CM | POA: Diagnosis not present

## 2020-02-12 DIAGNOSIS — N1831 Chronic kidney disease, stage 3a: Secondary | ICD-10-CM | POA: Diagnosis not present

## 2020-05-11 DIAGNOSIS — Z0100 Encounter for examination of eyes and vision without abnormal findings: Secondary | ICD-10-CM | POA: Diagnosis not present

## 2020-05-11 DIAGNOSIS — H524 Presbyopia: Secondary | ICD-10-CM | POA: Diagnosis not present

## 2020-05-11 DIAGNOSIS — Z961 Presence of intraocular lens: Secondary | ICD-10-CM | POA: Diagnosis not present

## 2020-06-08 ENCOUNTER — Other Ambulatory Visit: Payer: Self-pay

## 2020-06-08 ENCOUNTER — Ambulatory Visit (INDEPENDENT_AMBULATORY_CARE_PROVIDER_SITE_OTHER): Payer: Medicare HMO | Admitting: Cardiovascular Disease

## 2020-06-08 ENCOUNTER — Encounter: Payer: Self-pay | Admitting: Cardiovascular Disease

## 2020-06-08 DIAGNOSIS — E7800 Pure hypercholesterolemia, unspecified: Secondary | ICD-10-CM | POA: Diagnosis not present

## 2020-06-08 DIAGNOSIS — I10 Essential (primary) hypertension: Secondary | ICD-10-CM

## 2020-06-08 DIAGNOSIS — Z952 Presence of prosthetic heart valve: Secondary | ICD-10-CM | POA: Diagnosis not present

## 2020-06-08 DIAGNOSIS — K219 Gastro-esophageal reflux disease without esophagitis: Secondary | ICD-10-CM | POA: Diagnosis not present

## 2020-06-08 NOTE — Progress Notes (Signed)
Cardiology Office Note    Date:  06/10/2020   ID:  Drew Andrews, DOB 06-Mar-1946, MRN [id number]  PCP:  Haywood Pao, MD  Cardiologist:  Shelva Majestic, MD   51-monthfollow-up  History of Present Illness:  Drew Finiis a 75y.o. male who was first told of having a heart murmur back in the 1980s when he was planning to work at [employer] had a physical exam at that time. I have seen him since 2005 when he was referred to me by Dr. MEarlean Shawlfor cardiac murmur. Since 2005 he had undergone follow-up echo Doppler studies to assess his aortic valve. In 2005 his aortic transvalvular gradient was 37 with a mean gradient of 18. Over the last 9 years, his aortic valve murmur gradually become more significant and he remained asymptomatic. An echo in 11/03/2014showed normal LV function with grade 2 diastolic dysfunction. His aortic valve again was moderately calcified. The mean gradient had increased to 47 and peak instantaneous gradient 86 giving a calculated aortic valve area of 0.63 cm. He did have mitral annular calcification. His left atrium was moderately dilated. He did have very mild RV dilatation.  When I saw him in November 2014 Drew Andrews was continuing to exercise but there was new development of exertional shortness of breath. At that time, I strongly recommended cardiac catheterization and he underwent right and left heart cardiac catheterization on 03/15/2013. This confirmed severe aortic valve stenosis mild pulmonary hypertension. He had mild chronic calcification but nonobstructive 20% narrowing in the LAD.   He underwent successful aortic valve replacement surgery by Dr. BGilford Raid01/04/2014 and [facility] a [facility]ard[facility] inserted. He has done remarkably well following his valve replacement surgery. He denies any episodes of palpitations. He has resumed activity.  Since his valve replacement, he has  continued to feel well. He denies any shortness of breath or chest pain. He does exercise regularly. Typically in the cold winter months. He likes to exercise at cardiac rehabilitation for the months of January and February.. However, he has had several rare occurrences of diplopia it are short lived. He has been taking aspirin 81 mg daily. A repeat echo Doppler study on 12/23/2013.revealed an EF 55-60% with mild LVH. His aortic prosthesis was well-seated and open well. There was only trivial aortic insufficiency. He did have mild dilatation of his left atrium and mitral annular calcification. There also was some dilatation of his RV.   His echo in October 2016 continued to show excellent LV function with an EF of 55-60% and he had normal diastolic parameters. The bioprosthetic AV was functioning normally. The gradients were felt to be very minimally elevated and unchanged from his previous study with a mean gradient of 11 and peak gradient of 23 mm. There was no evidence for aortic insufficiency. There was evidence for mitral annular calcification and mild LA dilatation.  He underwent a 2-D echo Doppler study on 08/02/2016. He had normal LV function with EF of 60-65% and grade 2 diastolic dysfunction. His aortic bioprosthesis was well-seated and was functioning well. The mean gradient was 11 and peak gradient 20. He had mild MR. There was mild TR and mild LA dilatation.   He participates the maintenance phase of cardiac rehabilitation over the winter months and will be ending this week. Typically over the summer he rides his bike. On days he does not do cardiac rehabilitation in the winter he often walks and  if the weather is nice. He may ride his bike occasionally. He typically gained some weight over the winter months but loses weight over the summer when he is more active outside. He denies any chest pain, presyncope or syncope, palpitations, or change in exercise tolerance.  Recently, he has noticed his blood pressure being slightly elevated in the 130s to 140s when he arrives at cardiac rehabilitation after exercise his blood pressure is normal. He has increased his lisinopril to 7.5 mg.   When Isaw him in October 2018, he denied any episodes of chest pain or shortness of breath. He was exercising and walks for at least 30 minutes or bikes 7 days per week. Over the winter months, he again participated in the cardiac rehabilitation program. His blood pressure was mildly elevated and I titrated lisinopril to 15 mg daily. He continues to take metoprolol succinate 50 mg. He has been on generic Vytorin 10/40 for hyperlipidemia.   He underwent a follow-up echo Doppler study on January 08, 2018. This showed normal systolic function with grade 2 diastolic dysfunction. EF was 55 to 60%. His aortic bioprosthesis was well-seated. Mean gradient was 12. There was no AI. His a sending aorta was mildly dilated. There was mild MR and mild to moderate biatrial enlargement. He continues to be asymptomatic and feels well. He is enjoying retirement.   Since I saw him in December 2019, he again participated in the cardiac rehabilitation program over the winter months.  He was now back riding his bike at least 30 minutes a day and also on days that he does not ride his bike he typically walks at a fast pace.    I  evaluated him in a telemedicine visit on Sep 11, 2018 which time he continued to be stable and denied any chest pain, PND, or orthopnea.  He was taking  lisinopril 20 mg and Toprol-XL 50 mg for hypertension.  His blood pressure has been well controlled and typically when he leaves cardiac rehab it would be approximately 120/70.  He continues to be on Vytorin 10/40 for hyperlipidemia.  He has a hiatal hernia and GERD which is well controlled with omeprazole.  His laboratory typically is done by Dr. Osborne Casco.  I last saw him in December 2020 and since his prior  evaluation he had remained stable.  He was continuing  to ride his bike but in the winter months seems to walk more due to the weather.  Typically walks for 30 minutes for 2.2 miles.  He underwent a follow-up echo Doppler study on March 14, 2019.  EF remains 60 to 65% with mild LVH of the basal septum.  Diastolic function was indeterminate.  Had a stable bioprosthetic aortic valve with normal gradients with a mean gradient of 10 and peak gradient of 18.7.  There was no perivalvular aortic insufficiency.  He had mild pulmonic regurgitation.  His ascending aorta measured 39 mm.  He has continued to be on generic Vytorin 10/40 and lipid studies in August 2020 showed a total cholesterol 129, HDL 43, LDL 72, and triglycerides 70.  He continues to be on Toprol-XL 50 mg and lisinopril 20 mg daily.  GERD is controlled with omeprazole.    I last saw him in July 2021 and since his last evaluation he was either walking or biking on a daily basis.   Due to the Covid pandemic he did not do cardiac rehab over the winter.  He had recently had an insurance person come to  the house.  Apparently they had done ABIs and noticed his right ABI was reduced at 0.85 and left ABI was 1.24.  They were concerned about possible peripheral vascular disease.  He sees Dr. Osborne Casco who has checked laboratory.  He continues to be on generic Vytorin 10/40, lisinopril 20 mg daily, metoprolol succinate 50 mg daily and takes omeprazole over-the-counter daily.  During that evaluation, his blood pressure was elevated and I recommended slight titration of lisinopril from 20 mg up to 30 mg daily for more optimal blood pressure control.  Following his evaluation, he underwent lower extremity arterial Doppler study on January 13, 2020.  This revealed noncompressible right ankle brachial index and left ankle brachial index but normal right toe brachial and left toe brachial indices.  Presently, he continues to feel well without chest pain or shortness  of breath.  He denies any palpitations.  He sees Dr. Osborne Casco who checks his laboratory.  He denies presyncope or syncope.  He continues to try to walk daily has not been biking recently.  He continues to be on Vytorin 10/40 hyperlipidemia and lisinopril 30 mg with metoprolol succinate 50 mg daily for hypertension.  GERD is treated with over-the-counter Prilosec.  He presents for evaluation.   Past Medical History:  Diagnosis Date  . Bursitis of elbow 05/20/2013  . Essential hypertension 03/12/2013  . GERD (gastroesophageal reflux disease)   . Hyperlipidemia   . Hypertension   . Left ventricular diastolic dysfunction, NYHA class 2 03/12/2013  . LVH (left ventricular hypertrophy)    with aortic stenosis-bicuspid  . PONV (postoperative nausea and vomiting)    as a child  . S/P AVR (aortic valve replacement) 05/13/2013  . S/P AVR, 05/13/13, 25 mm Edwards Magna-Ease pericardial valve 05/13/2013  . Seasonal allergies   . Severe aortic stenosis 03/12/2013    Past Surgical History:  Procedure Laterality Date  . AORTIC VALVE REPLACEMENT N/A 05/13/2013   Procedure: AORTIC VALVE REPLACEMENT (AVR);  Surgeon: [name], MD;  Location: [place];  Service: Open Heart Surgery;  Laterality: N/A;  . APPENDECTOMY  age 45  . CARDIAC CATHETERIZATION    . COLONOSCOPY     X 2  . HIATAL HERNIA REPAIR    . INTRAOPERATIVE TRANSESOPHAGEAL ECHOCARDIOGRAM N/A 05/13/2013   Procedure: INTRAOPERATIVE TRANSESOPHAGEAL ECHOCARDIOGRAM;  Surgeon: [name], MD;  Location: [facility] OR;  Service: Open Heart Surgery;  Laterality: N/A;  . LEFT HEART CATHETERIZATION WITH CORONARY ANGIOGRAM N/A 03/15/2013   Procedure: LEFT HEART CATHETERIZATION WITH CORONARY ANGIOGRAM;  Surgeon: [name], MD;  Location: [facility] CATH LAB;  Service: Cardiovascular;  Laterality: N/A;  . TONSILLECTOMY    . TRANSTHORACIC ECHOCARDIOGRAM  03/04/2013   EF 13-24%, grade 2 diastolic dysfunction, AV with mod calcified leaflets & mild regurg, calcified MV  annulus, LA mod dilated, RV mildly dilated    Current Medications: Outpatient Medications Prior to Visit  Medication Sig Dispense Refill  . aspirin 81 MG tablet Take 81 mg by mouth daily.    [name] ezetimibe-simvastatin (VYTORIN) 10-40 MG tablet Take 1 tablet by mouth at bedtime. 90 tablet 3  . Ibuprofen-Diphenhydramine Cit (ADVIL PM PO) Take 2-3 tablets by mouth at bedtime as needed (for sleep).    [name] lisinopril (ZESTRIL) 30 MG tablet Take 1 tablet (30 mg total) by mouth daily. 90 tablet 3  . metoprolol succinate (TOPROL-XL) 50 MG 24 hr tablet TAKE 1 TABLET BY MOUTH  DAILY WITH OR IMMEDIATLEY  FOLLOWING A MEAL 90 tablet 3  .  omeprazole (PRILOSEC OTC) 20 MG tablet Take 20 mg by mouth daily.    [name] amoxicillin (AMOXIL) 500 MG tablet Take 4 tablets 1 hour prior to dental procedure (Patient not taking: Reported on 06/08/2020) 8 tablet 1   No facility-administered medications prior to visit.     Allergies:   Scopolamine   Social History   Socioeconomic History  . Marital status: Married    Spouse name: Not on file  . Number of children: 1  . Years of education: college  . Highest education level: Not on file  Occupational History  . Not on file  Tobacco Use  . Smoking status: Former Smoker    Packs/day: 1.00    Quit date: 02/10/1974    Years since quitting: 46.3  . Smokeless tobacco: Never Used  Substance and Sexual Activity  . Alcohol use: Yes    Comment: Drinks 1 bottle of wine daily and 1 beer  . Drug use: No  . Sexual activity: Not on file  Other Topics Concern  . Not on file  Social History Narrative  . Not on file   Social Determinants of Health   Financial Resource Strain: Not on file  Food Insecurity: Not on file  Transportation Needs: Not on file  Physical Activity: Not on file  Stress: Not on file  Social Connections: Not on file     Additional social history is notable in that he has a Paediatric nurse in IT sales professional. He is retired from [employer]. He quit tobacco in 1979. He continues to exercise 7 days per week.  Family History:  The patient's family history includes Acute myelogenous leukemia in his brother; Diabetes in his mother; Heart attack in his father and maternal grandfather; Hyperlipidemia in his mother; Hypertension in his mother; Pneumonia in his maternal grandmother.   ROS General: Negative; No fevers, chills, or night sweats;  HEENT: Negative; No changes in vision or hearing, sinus congestion, difficulty swallowing Pulmonary: Negative; No cough, wheezing, shortness of breath, hemoptysis Cardiovascular: Negative; No chest pain, presyncope, syncope, palpitations GI: Negative; No nausea, vomiting, diarrhea, or abdominal pain GU: Negative; No dysuria, hematuria, or difficulty voiding Musculoskeletal: Negative; no myalgias, joint pain, or weakness Hematologic/Oncology: Negative; no easy bruising, bleeding Endocrine: Negative; no heat/cold intolerance; no diabetes Neuro: Negative; no changes in balance, headaches Skin: Negative; No rashes or skin lesions Psychiatric: Negative; No behavioral problems, depression Sleep: Negative; No snoring, daytime sleepiness, hypersomnolence, bruxism, restless legs, hypnogognic hallucinations, no cataplexy Other comprehensive 14 point system review is negative.   PHYSICAL EXAM:   VS:  BP 130/75 (BP Location: Right Arm, Patient Position: Sitting)   Pulse (!) 50   Ht 5' 9.5" (1.765 m)   Wt 183 lb 6.4 oz (83.2 kg)   BMI 26.70 kg/m     Repeat blood pressure by me was 126/72  Wt Readings from Last 3 Encounters:  06/08/20 183 lb 6.4 oz (83.2 kg)  11/28/19 184 lb 12.8 oz (83.8 kg)  04/04/19 183 lb 9.6 oz (83.3 kg)     General: Alert, oriented, no distress.  Skin: normal turgor, no rashes, warm and dry HEENT: Normocephalic, atraumatic. Pupils equal round and reactive to light; sclera anicteric; extraocular muscles intact; Fundi ** Nose without nasal septal  hypertrophy Mouth/Parynx benign; Mallinpatti scale Neck: No JVD, no carotid bruits; normal carotid upstroke Lungs: clear to ausculatation and percussion; no wheezing or rales Chest wall: without tenderness to palpitation Heart: PMI not displaced, RRR, s1 s2 normal, 1/6 systolic murmur,  no diastolic murmur, no rubs, gallops, thrills, or heaves Abdomen: soft, nontender; no hepatosplenomehaly, BS+; abdominal aorta nontender and not dilated by palpation. Back: no CVA tenderness Pulses 2+ Musculoskeletal: full range of motion, normal strength, no joint deformities Extremities: no clubbing cyanosis or edema, Homan's sign negative  Neurologic: grossly nonfocal; Cranial nerves grossly wnl Psychologic: Normal mood and affect   Studies/Labs Reviewed:   EKG:  EKG is  ordered today.  ECG (independently read by me): Sinus Bradycardia at 50; 1st degree AV block PR 272 msec; QTc 417 msec  July 2022 ECG (independently read by me): Sinus bradycardia at 54 bpm with first-degree AV block; PR interval 282 ms.  LVH by voltage criteria in aVL.  December 2020 ECG (independently read by me): Sinus bradycardia at 58 bpm.  First-degree AV block with a parable of 256 ms.  LVH by voltage.  QTc interval 402 ms.  December 2019 ECG (independently read by me): Sinus bradycardia 59 bpm with first-degree AV block.  PR interval 278 ms.  LVH by voltage criteria.  March 2019 ECG (independently read by me): Sinus rhythm at 63 bpm.  First-degree AV block.  PAC.  Borderline voltage criteria for LVH.  October 2018 ECG (independently read by me): Sinus bradycardia 58 bpm.  First-degree AV block.  LVH by voltage.  April 2018 ECG (independently read by me): Sinus bradycardia at 58 bpm.  First degree AV block Borderline LVH by voltage criteria.  July 2017 ECG (independently read by me): Sinus bradycardia at 53 bpm.  Mild first-degree AV block with a PR interval at 226 ms.  Borderline LVH by voltage criteria.  Mild T wave  abnormality in 3 and aVF.  November 2016 ECG (independently read by me): Normal sinus rhythm at 65 bpm.  First-degree AV block with a PR interval at 244 ms.  October 2015 ECG disease independently read by me): Normal sinus rhythm at 57 beats per minute.  Mild first degree AV block.  Prior 07/08/2013 EKG (independently read by the) normal sinus rhythm. Non-specific ST-T changes inferolaterally.  Prior 03/12/13 ECG: Sinus rhythm at 66 beats per minute. First-degree AV block. LVH by voltage criteria. Mild nondiagnostic T changes.  Recent Labs: BMP Latest Ref Rng & Units 03/20/2017 07/08/2013 05/15/2013  Glucose 65 - 99 mg/dL 97 97 130(H)  BUN 8 - 27 mg/dL 23 19 13   Creatinine 0.76 - 1.27 mg/dL 1.22 1.02 0.95  BUN/Creat Ratio 10 - 24 19 - -  Sodium 134 - 144 mmol/L 142 139 137  Potassium 3.5 - 5.2 mmol/L 4.8 4.5 4.4  Chloride 96 - 106 mmol/L 105 108 102  CO2 20 - 29 mmol/L 20 22 22   Calcium 8.6 - 10.2 mg/dL 9.4 8.7 7.8(L)     Hepatic Function Latest Ref Rng & Units 07/08/2013 05/09/2013 03/12/2013  Total Protein 6.0 - 8.3 g/dL 6.5 7.4 7.0  Albumin 3.5 - 5.2 g/dL 4.1 4.2 4.6  AST 0 - 37 U/L 16 24 17   ALT 0 - 53 U/L 21 33 18  Alk Phosphatase 39 - 117 U/L 44 43 39  Total Bilirubin 0.2 - 1.2 mg/dL 0.4 0.5 0.7    CBC Latest Ref Rng & Units 08/19/2013 07/08/2013 05/17/2013  WBC 4.0 - 10.5 K/uL 6.8 6.8 6.7  Hemoglobin 13.0 - 17.0 g/dL 13.4 12.3(L) 9.4(L)  Hematocrit 39.0 - 52.0 % 39.3 36.7(L) 27.3(L)  Platelets 150 - 400 K/uL 170 166 84(L)   Lab Results  Component Value Date   MCV 92.0 08/19/2013  MCV 95.1 07/08/2013   MCV 96.8 05/17/2013   Lab Results  Component Value Date   TSH 1.554 07/08/2013   Lab Results  Component Value Date   HGBA1C 5.5 05/09/2013     BNP No results found for: BNP  ProBNP No results found for: PROBNP   Lipid Panel     Component Value Date/Time   CHOL 122 07/08/2013 0856   TRIG 89 07/08/2013 0856   HDL 42 07/08/2013 0856   CHOLHDL 2.9  07/08/2013 0856   VLDL 18 07/08/2013 0856   LDLCALC 62 07/08/2013 0856     RADIOLOGY: No results found.   Additional studies/ records that were reviewed today include:   ECHO SUMMARY:03/14/2019 Left Ventricle: Normal LV size and function with EF 60-65%. There is mild hypertrophy of the basal septum. Diastolic dysfunction is inderterminant. There is increased LV filling pressures. Right Ventricle: Normal RV size and RVF. Right Atrium: Normal size. Left Atrium: Severely dilated. Mitral Valve: Moderate thickening and calcification of the anterior MV leaflet with moderate mitral annular calcification and trivial MR. Aortic Valve: S/P bioprosthetic AVR that appears to be functioning normally. Aortic valve mean gradient measures 10.0 mmHg. Aortic valve peak gradient measures 18.7 mmHg. Aortic valve area, by VTI measures 1.32 cm. There is no perivalvular AI. Pulmonic Valve: There is mild pulmonic regurgitation. Tricuspid Valve: There is trivial tricuspid regurgitation. Aorta: Mildly dilated ascending aorta at 30m.  FINDINGS:  Left Ventricle: Left ventricular ejection fraction, by visual estimation, is 60 to 65%. The left ventricle has normal function. There is mildly increased left ventricular hypertrophy of the basal septum. Left ventricular diastolic parameters are  indeterminate. Elevated left ventricular end-diastolic pressure.  Right Ventricle: The right ventricular size is normal. No increase in right ventricular wall thickness. Global RV systolic function is has normal systolic function. The tricuspid regurgitant velocity is 2.07 m/s, and with an assumed right atrial pressure  of 3 mmHg, the estimated right ventricular systolic pressure is normal at 20.2 mmHg.  Left Atrium: Left atrial size was severely dilated.  Right Atrium: Right atrial size was normal in size  Pericardium: There is no evidence of pericardial effusion.  Mitral Valve: The mitral valve is normal in  structure. There is moderate thickening of the anterior mitral valve leaflet(s). There is moderate calcification of the anterior mitral valve leaflet(s). Moderate mitral annular calcification. No evidence of  mitral valve stenosis by observation. MV peak gradient, 6.7 mmHg. Trace mitral valve regurgitation.  Tricuspid Valve: The tricuspid valve is normal in structure. Tricuspid valve regurgitation is mild.  Aortic Valve: The aortic valve is normal in structure. Aortic valve regurgitation is not visualized. The aortic valve is structurally normal, with no evidence of sclerosis or stenosis. Aortic valve mean gradient measures 10.0 mmHg. Aortic valve peak  gradient measures 18.7 mmHg. Aortic valve area, by VTI measures 1.32 cm.  Pulmonic Valve: The pulmonic valve was normal in structure. Pulmonic valve regurgitation is mild.  Aorta: Aortic dilatation noted. There is mild dilatation of the ascending aorta measuring 39 mm.  Venous: The inferior vena cava is normal in size with greater than 50% respiratory variability, suggesting right atrial pressure of 3 mmHg.  IAS/Shunts: No atrial level shunt detected by color flow Doppler. No ventricular septal defect is seen or detected. There is no evidence of an atrial septal defect.     LEFT VENTRICLE PLAX 2D LVIDd:         4.12 cm  Diastology LVIDs:  3.08 cm  LV e' lateral:   11.20 cm/s LV PW:         0.97 cm  LV E/e' lateral: 9.1 LV IVS:        1.25 cm  LV e' medial:    5.98 cm/s LVOT diam:     2.00 cm  LV E/e' medial:  17.1 LV SV:         38 ml LV SV Index:   18.76 LVOT Area:     3.14 cm    RIGHT VENTRICLE RV Basal diam:  4.88 cm RV S prime:     12.50 cm/s TAPSE (M-mode): 2.5 cm RVSP:           20.2 mmHg  LEFT ATRIUM              Index       RIGHT ATRIUM           Index LA diam:        5.00 cm  2.52 cm/m  RA Pressure: 3.00 mmHg LA Vol (A2C):   95.6 ml  48.15 ml/m RA Area:     19.05 cm LA Vol (A4C):   101.0 ml 50.87  ml/m RA Volume:   50.85 ml  25.61 ml/m LA Biplane Vol: 101.0 ml 50.87 ml/m  AORTIC VALVE AV Area (Vmax):    1.22 cm AV Area (Vmean):   1.30 cm AV Area (VTI):     1.32 cm AV Vmax:           216.00 cm/s AV Vmean:          143.000 cm/s AV VTI:            0.516 m AV Peak Grad:      18.7 mmHg AV Mean Grad:      10.0 mmHg LVOT Vmax:         84.20 cm/s LVOT Vmean:        59.000 cm/s LVOT VTI:          0.217 m LVOT/AV VTI ratio: 0.42   AORTA Ao Root diam: 3.50 cm  MITRAL VALVE                         TRICUSPID VALVE MV Area (PHT): 2.80 cm              TR Peak grad:   17.2 mmHg MV Peak grad:  6.7 mmHg              TR Vmax:        263.00 cm/s MV Mean grad:  2.0 mmHg              Estimated RAP:  3.00 mmHg MV Vmax:       1.29 m/s              RVSP:           20.2 mmHg MV Vmean:      67.4 cm/s MV VTI:        0.42 m                SHUNTS MV PHT:        78.59 msec            Systemic VTI:  0.22 m MV Decel Time: 271 msec              Systemic Diam: 2.00 cm MV E velocity: 102.00 cm/s 103 cm/s MV A velocity: 112.00  cm/s 70.3 cm/s MV E/A ratio:  0.91        1.5   LE Vascular US: 01/13/2020  Summary:  Right: Resting right ankle-brachial index indicates noncompressible right  lower extremity arteries. The right toe-brachial index is normal.   Left: Resting left ankle-brachial index indicates noncompressible left  lower extremity arteries. The left toe-brachial index is normal.   ASSESSMENT:    1. Severe aortic stenosis: S/P aortic valve replacement May 13, 2013 with a 25 mm Edwards magna ease bovine pericardial valve   2. Essential hypertension   3. Pure hypercholesterolemia   4. GERD without esophagitis    PLAN:  Mr. Veverly Andrews  is a 75 year old gentleman who developed severe aortic stenosis and underwent successful AVR with a 25 mm [facility] Ease bovine pericardial tissue valve on May 13, 2013.  He has a history of hypertension and hyperlipidemia.  On his most recent  echo Doppler study of March 14, 2019 his bioprosthetic valve remained stable with normal gradients and without evidence for perivalvular leak.  EF was 60 to 65% with mild left ventricular hypertrophy of the basal septum.  He had moderate mitral annular calcification with trivial MR, mild PR and his ascending aorta measured 39 mm.   Presently, his blood pressure is controlled today on his increased regimen of lisinopril 30 mg and he continues to take metoprolol succinate 50 mg daily.  His ECG shows sinus bradycardia at 50 bpm with first-degree AV block.  He remains asymptomatic without chest pain, shortness of breath, PND orthopnea, palpitations, presyncope or syncope.  I reviewed his lower extremity vascular ultrasound which is revealed normal toe brachial indices.  He is not having any claudication symptoms.  He continues to be on Vytorin 10/40 for hyperlipidemia.  Dr. Osborne Casco has been checking his laboratory.  Target LDL ideally is less than 70.  He continues to be active and does some activity every day.  He has not been riding his bike during the winter months and no longer was doing the cardiac rehab during the winter months during the COVID-19 pandemic.  He will continue current therapy.  I will see him in 6 months for follow-up evaluation and prior to that office visit he will undergo a follow-up echo Doppler study for reassessment of his aortic valve replacement in addition to systolic and diastolic function.  His GERD continues to be controlled with omeprazole.  I will see him in follow-up of his echo and further recommendations were made at that time.  Medication Adjustments/Labs and Tests Ordered: Current medicines are reviewed at length with the patient today.  Concerns regarding medicines are outlined above.  Medication changes, Labs and Tests ordered today are listed in the Patient Instructions below. Patient Instructions  Medication Instructions:  Your physician recommends that you continue  on your current medications as directed. Please refer to the Current Medication list given to you today.  *If you need a refill on your cardiac medications before your next appointment, please call your pharmacy*  Testing/Procedures: Your physician has requested that you have an echocardiogram in 6 MONTHS. Echocardiography is a painless test that uses sound waves to create images of your heart. It provides your doctor with information about the size and shape of your heart and how well your heart's chambers and valves are working. This procedure takes approximately one hour. There are no restrictions for this procedure.  This will be done at our [facility] location:  [place]:  At [facility], you and your health needs are our priority.  As part of our continuing mission to provide you with exceptional heart care, we have created designated Provider Care Teams.  These Care Teams include your primary Cardiologist (physician) and Advanced Practice Providers (APPs -  Physician Assistants and Nurse Practitioners) who all work together to provide you with the care you need, when you need it.  We recommend signing up for the patient portal called "MyChart".  Sign up information is provided on this After Visit Summary.  MyChart is used to connect with patients for Virtual Visits (Telemedicine).  Patients are able to view lab/test results, encounter notes, upcoming appointments, etc.  Non-urgent messages can be sent to your provider as well.   To learn more about what you can do with MyChart, go to [email or web address].    Your next appointment:   6 month(s)  The format for your next appointment:   In Person  Provider:   Shelva Majestic, MD         Signed, Shelva Majestic, MD  06/10/2020 4:38 PM    [facility] [address], [place], [place], [place]  [address] Phone: [phone]

## 2020-06-08 NOTE — Patient Instructions (Signed)
Medication Instructions:  Your physician recommends that you continue on your current medications as directed. Please refer to the Current Medication list given to you today.  *If you need a refill on your cardiac medications before your next appointment, please call your pharmacy*  Testing/Procedures: Your physician has requested that you have an echocardiogram in 6 MONTHS. Echocardiography is a painless test that uses sound waves to create images of your heart. It provides your doctor with information about the size and shape of your heart and how well your heart's chambers and valves are working. This procedure takes approximately one hour. There are no restrictions for this procedure.  This will be done at our [address] location:  [address]  Follow-Up: At [facility], you and your health needs are our priority.  As part of our continuing mission to provide you with exceptional heart care, we have created designated Provider Care Teams.  These Care Teams include your primary Cardiologist (physician) and Advanced Practice Providers (APPs -  Physician Assistants and Nurse Practitioners) who all work together to provide you with the care you need, when you need it.  We recommend signing up for the patient portal called "MyChart".  Sign up information is provided on this After Visit Summary.  MyChart is used to connect with patients for Virtual Visits (Telemedicine).  Patients are able to view lab/test results, encounter notes, upcoming appointments, etc.  Non-urgent messages can be sent to your provider as well.   To learn more about what you can do with MyChart, go to [email or web address].    Your next appointment:   6 month(s)  The format for your next appointment:   In Person  Provider:   Thomas Kelly, MD   

## 2020-06-10 ENCOUNTER — Encounter: Payer: Self-pay | Admitting: Cardiovascular Disease

## 2020-06-15 DIAGNOSIS — I10 Essential (primary) hypertension: Secondary | ICD-10-CM | POA: Diagnosis not present

## 2020-06-15 DIAGNOSIS — Z8249 Family history of ischemic heart disease and other diseases of the circulatory system: Secondary | ICD-10-CM | POA: Diagnosis not present

## 2020-06-15 DIAGNOSIS — K219 Gastro-esophageal reflux disease without esophagitis: Secondary | ICD-10-CM | POA: Diagnosis not present

## 2020-06-15 DIAGNOSIS — Z6828 Body mass index (BMI) 28.0-28.9, adult: Secondary | ICD-10-CM | POA: Diagnosis not present

## 2020-06-15 DIAGNOSIS — Z7722 Contact with and (suspected) exposure to environmental tobacco smoke (acute) (chronic): Secondary | ICD-10-CM | POA: Diagnosis not present

## 2020-06-15 DIAGNOSIS — E663 Overweight: Secondary | ICD-10-CM | POA: Diagnosis not present

## 2020-06-15 DIAGNOSIS — Z008 Encounter for other general examination: Secondary | ICD-10-CM | POA: Diagnosis not present

## 2020-06-15 DIAGNOSIS — Z87891 Personal history of nicotine dependence: Secondary | ICD-10-CM | POA: Diagnosis not present

## 2020-06-15 DIAGNOSIS — N529 Male erectile dysfunction, unspecified: Secondary | ICD-10-CM | POA: Diagnosis not present

## 2020-06-15 DIAGNOSIS — E785 Hyperlipidemia, unspecified: Secondary | ICD-10-CM | POA: Diagnosis not present

## 2020-06-15 DIAGNOSIS — Z7982 Long term (current) use of aspirin: Secondary | ICD-10-CM | POA: Diagnosis not present

## 2020-06-18 ENCOUNTER — Telehealth: Payer: Self-pay | Admitting: Cardiovascular Disease

## 2020-06-18 NOTE — Telephone Encounter (Signed)
Pt c/o BP issue: STAT if pt c/o blurred vision, one-sided weakness or slurred speech  1. What are your last 5 BP readings? 06-16-20- 180/108 168/98 119/71   06-17-20-  156/100. 146/87 136/87 187/109 159/95 168/100 134/80 126/79  06-18-20- 141/54, 161/95 154/94  Heart rate between 55 and 65  2. Are you having any other symptoms (ex. Dizziness, headache, blurred vision, passed out)? no**  3. What is your BP issue? Blood pressure is high most of the time- pt wanted an appt -I made an appt for 06-22-20 with Marjie Skiff

## 2020-06-18 NOTE — Telephone Encounter (Signed)
Attempted to call patient. Phone line is busy.

## 2020-06-20 NOTE — Progress Notes (Signed)
Cardiology Office Note:    Date:  06/22/2020   ID:  Aerion Bagdasarian, DOB 1945/07/26, MRN [id number]  PCP:  Haywood Pao, MD  Cardiologist:  Shelva Majestic, MD  Electrophysiologist:  None   Referring MD: Haywood Pao, MD   Chief Complaint: "elevated BP"  History of Present Illness:    Drew Andrews is a 75 y.o. male with a history of minimal non-obstructive CAD on cardiac catheterization in 03/2013, severe aortic stenosis s/p AVR in 05/2013, hypertension, hyperlipidemia, and GERD who is followed by Dr. Claiborne Billings and presents today due to concerns for elevated BP.  Patient underwent successful aortic valve replacement with 16m [facility] LL[facility]ase bovine pericardial valve by Dr. BCyndia Bentin 05/2013. R/LHC prior to surgery showed only minimal CAD with 20% narrowing of LAD as well as mild pulmonary hypertension. Since his surgery, he has done well form a cardiac standpoint. Most recent Echo in 03/2019 showed LVEF of 60-65% with mild hypertrophy of the basal septum, normally functioning AVR with mean gradient of 10.0 mmHg, moderate MAC and trivial MR, and mildly dilated ascending aorta of 368m Patient was recently seen by Dr. KeClaiborne Billingsn 06/08/2020 at which time he was doing well with no reports of chest pain, shortness of breath, or palpitations.   Patient called our office on 06/18/2020 with concerns of elevated BP. Therefore, this visit was scheduled for further evaluation. Patient reports BP has been elevated since last visit with systolic mostly in the 17270'Wut BP as high as the 190/105. He has been keeping a detailed BP log at home. Since 06/16/2020 BP has ranged from 119/71 to 187/109. Average BP 155/90. He reports compliance with medications and denies any cardiac symptoms. No chest pain, shortness of breath, orthopnea, PND, edema, palpitations, lightheadedness, dizziness, or syncope.  Initial systolic BP in the office was >200. After allowing patient to rest for a while, BP  172/100. When I personally checked BP during visit I got 190/100. Home BP machine read 183/108.   Past Medical History:  Diagnosis Date  . Bursitis of elbow 05/20/2013  . Essential hypertension 03/12/2013  . GERD (gastroesophageal reflux disease)   . Hyperlipidemia   . Hypertension   . Left ventricular diastolic dysfunction, NYHA class 2 03/12/2013  . LVH (left ventricular hypertrophy)    with aortic stenosis-bicuspid  . PONV (postoperative nausea and vomiting)    as a child  . S/P AVR (aortic valve replacement) 05/13/2013  . S/P AVR, 05/13/13, 25 mm Edwards Magna-Ease pericardial valve 05/13/2013  . Seasonal allergies   . Severe aortic stenosis 03/12/2013    Past Surgical History:  Procedure Laterality Date  . AORTIC VALVE REPLACEMENT N/A 05/13/2013   Procedure: AORTIC VALVE REPLACEMENT (AVR);  Surgeon: BrGaye PollackMD;  [facility];  Laterality: N/A;  . APPENDECTOMY  age 75. CARDIAC CATHETERIZATION    . COLONOSCOPY     X 2  . HIATAL HERNIA REPAIR    . INTRAOPERATIVE TRANSESOPHAGEAL ECHOCARDIOGRAM N/A 05/13/2013   Procedure: INTRAOPERATIVE TRANSESOPHAGEAL ECHOCARDIOGRAM;  Surgeon: BrGaye PollackMD;  [facility];  Service: Open Heart Surgery;  Laterality: N/A;  . LEFT HEART CATHETERIZATION WITH CORONARY ANGIOGRAM N/A 03/15/2013   Procedure: LEFT HEART CATHETERIZATION WITH CORONARY ANGIOGRAM;  Surgeon: ThTroy SineMD;  [facility];  Service: Cardiovascular;  Laterality: N/A;  . TONSILLECTOMY    . TRANSTHORACIC ECHOCARDIOGRAM  03/04/2013   EF 5523-76%grade 2 diastolic dysfunction, AV  with mod calcified leaflets & mild regurg, calcified MV annulus, LA mod dilated, RV mildly dilated    Current Medications: Current Meds  Medication Sig  . amoxicillin (AMOXIL) 500 MG tablet Take 4 tablets 1 hour prior to dental procedure  . aspirin 81 MG tablet Take 81 mg by mouth daily.  [name] ezetimibe-simvastatin (VYTORIN) 10-40 MG tablet Take 1  tablet by mouth at bedtime.  . Ibuprofen-Diphenhydramine Cit (ADVIL PM PO) Take 2-3 tablets by mouth at bedtime as needed (for sleep).  [name] omeprazole (PRILOSEC OTC) 20 MG tablet Take 20 mg by mouth daily.  . [DISCONTINUED] carvedilol (COREG) 6.25 MG tablet Take 1 tablet (6.25 mg total) by mouth 2 (two) times daily.  . [DISCONTINUED] lisinopril (ZESTRIL) 30 MG tablet Take 1 tablet (30 mg total) by mouth daily.  . [DISCONTINUED] lisinopril (ZESTRIL) 40 MG tablet Take 1 tablet (40 mg total) by mouth daily.  . [DISCONTINUED] metoprolol succinate (TOPROL-XL) 50 MG 24 hr tablet TAKE 1 TABLET BY MOUTH  DAILY WITH OR IMMEDIATLEY  FOLLOWING A MEAL     Allergies:   Scopolamine   Social History   Socioeconomic History  . Marital status: Married    Spouse name: Not on file  . Number of children: 1  . Years of education: college  . Highest education level: Not on file  Occupational History  . Not on file  Tobacco Use  . Smoking status: Former Smoker    Packs/day: 1.00    Quit date: 02/10/1974    Years since quitting: 46.3  . Smokeless tobacco: Never Used  Substance and Sexual Activity  . Alcohol use: Yes    Comment: Drinks 1 bottle of wine daily and 1 beer  . Drug use: No  . Sexual activity: Not on file  Other Topics Concern  . Not on file  Social History Narrative  . Not on file   Social Determinants of Health   Financial Resource Strain: Not on file  Food Insecurity: Not on file  Transportation Needs: Not on file  Physical Activity: Not on file  Stress: Not on file  Social Connections: Not on file     Family History: The patient's family history includes Acute myelogenous leukemia in his brother; Diabetes in his mother; Heart attack in his father and maternal grandfather; Hyperlipidemia in his mother; Hypertension in his mother; Pneumonia in his maternal grandmother. There is no history of Colon cancer or Stomach cancer.  ROS:   Please see the history of present illness.      EKGs/Labs/Other Studies Reviewed:    The following studies were reviewed today:  Right/Left Cardiac Catheterization 03/15/2013: Angiography: - Fluoroscopy revealed severe calcification of his aortic valve.  - Supravalvular aortography demonstrated markedly reduced aortic valve excursion with trivial aortic insufficiency.  1. Left main:  Angiographically normal short vessel which bifurcated into the left anterior descending artery and a large dominant left circumflex coronary artery 2. LAD: Mild proximal coronary calcification. Mild 20% smooth narrowing proximally after the first diagonal vessel. The LAD wrapped around the LV apex and was otherwise free of significant disease. 3. Left circumflex: Angiographically normal dominant vessel 4. Right coronary artery: Angiographically normal nondominant right coronary artery  Impressions: - Mild Pulmonary hypertension - No significant coronary objective disease with evidence for mild coronary calcification of the LAD with smooth 20% narrowing but otherwise angiographically normal vessels with a dominant left circumflex system - Severely calcified aortic valve with reduced excursion and trivial aortic insufficiency  Echocardiographic documentation of severe  aortic valve stenosis with a mean gradient of 47, a peak instantaneous gradient of 86, and a calculated aortic valve area by echocardiography of 0.63 cm. _______________  Echocardiogram 03/14/2019:  Summary: Left Ventricle: Normal LV size and function with EF 60-65%. There is mild  hypertrophy of the basal septum. Diastolic dysfunction is inderterminant.  There is increased LV filling pressures.  Right Ventricle: Normal RV size and RVF.  Right Atrium: Normal size.  Left Atrium: Severely dilated.  Mitral Valve: Moderate thickening and calcification of the anterior MV  leaflet with moderate mitral annular calcification and trivial MR.  Aortic Valve: S/P bioprosthetic AVR that appears  to be functioning  normally. Aortic valve mean gradient measures 10.0 mmHg. Aortic valve peak  gradient measures 18.7 mmHg. Aortic valve area, by VTI measures 1.32 cm.  There is no perivalvular AI.  Pulmonic Valve: There is mild pulmonic regurgitation.  Tricuspid Valve: There is trivial tricuspid regurgitation.  Aorta: Mildly dilated ascending aorta at 65m.   EKG:  EKG not ordered today.   Recent Labs: No results found for requested labs within last 8760 hours.  Recent Lipid Panel    Component Value Date/Time   CHOL 122 07/08/2013 0856   TRIG 89 07/08/2013 0856   HDL 42 07/08/2013 0856   CHOLHDL 2.9 07/08/2013 0856   VLDL 18 07/08/2013 0856   LDLCALC 62 07/08/2013 0856    Physical Exam:    Vital Signs: BP (!) 172/100 (BP Location: Left Arm, Patient Position: Sitting)   Pulse (!) 59   Ht _0  (1.753 m)   Wt 195 lb (88.5 kg)   SpO2 98%   BMI 28.80 kg/m     Wt Readings from Last 3 Encounters:  06/22/20 195 lb (88.5 kg)  06/08/20 183 lb 6.4 oz (83.2 kg)  11/28/19 184 lb 12.8 oz (83.8 kg)     General: 75y.o. male in no acute distress. HEENT: Normocephalic and atraumatic. Sclera clear.  Neck: Supple. No JVD. Heart: Borderline bradycardia with normal rate. Distinct S1 and S2. No significant murmurs, gallops, or rubs. Radial pulses 2+ and equal bilaterally. Lungs: No increased work of breathing. Clear to ausculation bilaterally. No wheezes, rhonchi, or rales.   Abdomen: Soft, non-distended, and non-tender to palpation.  Extremities: No lower extremity edema.    Skin: Warm and dry. Neuro: Alert and oriented x3. No focal deficits. Psych: Normal affect. Responds appropriately.  Assessment:    1. Primary hypertension   2. Severe aortic stenosis   3. S/P AVR   4. Coronary artery disease involving native coronary artery of native heart without angina pectoris   5. Hyperlipidemia, unspecified hyperlipidemia type     Plan:    Hypertension - BP elevated in office.  Reviewed home BP log at home. BP has ranged from 119/71 to 187/109BP as high as 187/109 at home since 06/16/2020.  Average BP at home 155/90.  - Will increase Lisinopril to 451mdaily. - Will stop Toprol-XL and switch to Coreg 6.2569mwice daily for better BP control. - Patient to keep BP/HR log for 2 weeks and then send us Koreais.  - Will check BMET today.  Severe Aortic Stenosis s/p AVR in 05/2013 - Most recent Echo in 03/2019 showed normally functioning AVR with mean gradient of 10.0 mmHg, peak gradient of 18.7 mmHg, and no perivalvular AI.  - No significant murmur noted on exam. - Repeat Echo scheduled for 11/2020. Ordered at last office visit.  - Continue SBE prophylaxis prior to dental procedures.  Minimal CAD - R/LHC in 03/2013 prior to AVR showed only minimal CAD with 20% narrowing at LAD. Otherwise, no disease.  - No angina.  - Continue aspirin, beta-blocker, and statin.  Hyperlipidemia - Continue Vytorin (Ezetimibe-Simvastatin) 10-15m daily.  - Followed by PCP.   Disposition: Follow up in 6 months with Dr. KClaiborne Billingsas already directed. If BP still uncontrolled when he sends in BP log, can schedule sooner follow-up.   Medication Adjustments/Labs and Tests Ordered: Current medicines are reviewed at length with the patient today.  Concerns regarding medicines are outlined above.  Orders Placed This Encounter  Procedures  . Basic metabolic panel   Meds ordered this encounter  Medications  . DISCONTD: lisinopril (ZESTRIL) 40 MG tablet    Sig: Take 1 tablet (40 mg total) by mouth daily.    Dispense:  90 tablet    Refill:  3  . DISCONTD: carvedilol (COREG) 6.25 MG tablet    Sig: Take 1 tablet (6.25 mg total) by mouth 2 (two) times daily.    Dispense:  180 tablet    Refill:  3  . lisinopril (ZESTRIL) 40 MG tablet    Sig: Take 1 tablet (40 mg total) by mouth daily.    Dispense:  30 tablet    Refill:  0  . carvedilol (COREG) 6.25 MG tablet    Sig: Take 1 tablet (6.25 mg total)  by mouth 2 (two) times daily.    Dispense:  60 tablet    Refill:  0    Patient Instructions  Medication Instructions:  Increase Lisinopril to 40 mg daily  Stop Metoprolol  Start Carvedilol 6.25 mg twice a day  Continue all other medications  *If you need a refill on your cardiac medications before your next appointment, please call your pharmacy*   Lab Work: bmet today   Testing/Procedures: None ordered   Follow-Up: At [facility] you and your health needs are our priority.  As part of our continuing mission to provide you with exceptional heart care, we have created designated Provider Care Teams.  These Care Teams include your primary Cardiologist (physician) and Advanced Practice Providers (APPs -  Physician Assistants and Nurse Practitioners) who all work together to provide you with the care you need, when you need it.  We recommend signing up for the patient portal called "MyChart".  Sign up information is provided on this After Visit Summary.  MyChart is used to connect with patients for Virtual Visits (Telemedicine).  Patients are able to view lab/test results, encounter notes, upcoming appointments, etc.  Non-urgent messages can be sent to your provider as well.   To learn more about what you can do with MyChart, go to [email or web address]    Your next appointment:  6 months   The format for your next appointment:  Office   Provider: Dr.Kelly     Check blood pressure and pulse daily for the next 2 weeks send readings to office by mychart  Do Not check blood pressure over 2 times in a day.     Signed, CDarreld Mclean PA-C  06/22/2020 10:23 AM    [facility]

## 2020-06-22 ENCOUNTER — Ambulatory Visit: Payer: Medicare HMO | Admitting: Student

## 2020-06-22 ENCOUNTER — Encounter: Payer: Self-pay | Admitting: Student

## 2020-06-22 ENCOUNTER — Other Ambulatory Visit: Payer: Self-pay

## 2020-06-22 VITALS — BP 172/100 | HR 59 | Ht 69.0 in | Wt 195.0 lb

## 2020-06-22 DIAGNOSIS — I350 Nonrheumatic aortic (valve) stenosis: Secondary | ICD-10-CM | POA: Diagnosis not present

## 2020-06-22 DIAGNOSIS — E785 Hyperlipidemia, unspecified: Secondary | ICD-10-CM

## 2020-06-22 DIAGNOSIS — Z952 Presence of prosthetic heart valve: Secondary | ICD-10-CM

## 2020-06-22 DIAGNOSIS — I10 Essential (primary) hypertension: Secondary | ICD-10-CM | POA: Diagnosis not present

## 2020-06-22 DIAGNOSIS — I2510 Atherosclerotic heart disease of native coronary artery without angina pectoris: Secondary | ICD-10-CM

## 2020-06-22 MED ORDER — CARVEDILOL 6.25 MG PO TABS
6.2500 mg | ORAL_TABLET | Freq: Two times a day (BID) | ORAL | 0 refills | Status: DC
Start: 2020-06-22 — End: 2020-06-22

## 2020-06-22 MED ORDER — LISINOPRIL 40 MG PO TABS: 40.0000 mg | ORAL_TABLET | Freq: Every day | ORAL | 3 refills | Status: DC

## 2020-06-22 MED ORDER — LISINOPRIL 40 MG PO TABS: 40.0000 mg | ORAL_TABLET | Freq: Every day | ORAL | 0 refills | Status: DC

## 2020-06-22 MED ORDER — CARVEDILOL 6.25 MG PO TABS: 6.2500 mg | ORAL_TABLET | Freq: Two times a day (BID) | ORAL | 3 refills | Status: DC

## 2020-06-22 NOTE — Patient Instructions (Signed)
Medication Instructions:  Increase Lisinopril to 40 mg daily  Stop Metoprolol  Start Carvedilol 6.25 mg twice a day  Continue all other medications  *If you need a refill on your cardiac medications before your next appointment, please call your pharmacy*   Lab Work: bmet today   Testing/Procedures: None ordered   Follow-Up: At [facility], you and your health needs are our priority.  As part of our continuing mission to provide you with exceptional heart care, we have created designated Provider Care Teams.  These Care Teams include your primary Cardiologist (physician) and Advanced Practice Providers (APPs -  Physician Assistants and Nurse Practitioners) who all work together to provide you with the care you need, when you need it.  We recommend signing up for the patient portal called "MyChart".  Sign up information is provided on this After Visit Summary.  MyChart is used to connect with patients for Virtual Visits (Telemedicine).  Patients are able to view lab/test results, encounter notes, upcoming appointments, etc.  Non-urgent messages can be sent to your provider as well.   To learn more about what you can do with MyChart, go to [email or web address].    Your next appointment:  6 months   The format for your next appointment:  Office   Provider: Dr.Kelly     Check blood pressure and pulse daily for the next 2 weeks send readings to office by mychart  Do Not check blood pressure over 2 times in a day.

## 2020-06-22 NOTE — Addendum Note (Signed)
Addended by: Neoma Laming on: 06/22/2020 10:39 AM   Modules accepted: Orders

## 2020-06-23 ENCOUNTER — Other Ambulatory Visit: Payer: Self-pay

## 2020-06-23 DIAGNOSIS — I10 Essential (primary) hypertension: Secondary | ICD-10-CM

## 2020-06-23 DIAGNOSIS — Z952 Presence of prosthetic heart valve: Secondary | ICD-10-CM

## 2020-06-23 DIAGNOSIS — Z79899 Other long term (current) drug therapy: Secondary | ICD-10-CM

## 2020-06-23 DIAGNOSIS — I350 Nonrheumatic aortic (valve) stenosis: Secondary | ICD-10-CM

## 2020-06-23 LAB — BASIC METABOLIC PANEL
BUN/Creatinine Ratio: 17 (ref 10–24)
BUN: 19 mg/dL (ref 8–27)
CO2: 16 mmol/L — ABNORMAL LOW (ref 20–29)
Calcium: 9.3 mg/dL (ref 8.6–10.2)
Chloride: 104 mmol/L (ref 96–106)
Creatinine, Ser: 1.12 mg/dL (ref 0.76–1.27)
GFR calc Af Amer: 73 mL/min/{1.73_m2} (ref >59)
GFR calc non Af Amer: 63 mL/min/{1.73_m2} (ref >59)
Glucose: 82 mg/dL (ref 65–99)
Sodium: 139 mmol/L (ref 134–144)

## 2020-06-24 NOTE — Telephone Encounter (Signed)
Patient seen 02/21 in office. Addressed concerns.

## 2020-07-08 ENCOUNTER — Other Ambulatory Visit: Payer: Self-pay

## 2020-07-08 DIAGNOSIS — Z79899 Other long term (current) drug therapy: Secondary | ICD-10-CM | POA: Diagnosis not present

## 2020-07-08 DIAGNOSIS — I350 Nonrheumatic aortic (valve) stenosis: Secondary | ICD-10-CM

## 2020-07-08 DIAGNOSIS — Z952 Presence of prosthetic heart valve: Secondary | ICD-10-CM | POA: Diagnosis not present

## 2020-07-08 DIAGNOSIS — I10 Essential (primary) hypertension: Secondary | ICD-10-CM | POA: Diagnosis not present

## 2020-07-09 LAB — BASIC METABOLIC PANEL
BUN/Creatinine Ratio: 15 (ref 10–24)
BUN: 20 mg/dL (ref 8–27)
CO2: 19 mmol/L — ABNORMAL LOW (ref 20–29)
Calcium: 9.2 mg/dL (ref 8.6–10.2)
Chloride: 103 mmol/L (ref 96–106)
Creatinine, Ser: 1.36 mg/dL — ABNORMAL HIGH (ref 0.76–1.27)
Glucose: 98 mg/dL (ref 65–99)
Potassium: 4.4 mmol/L (ref 3.5–5.2)
Sodium: 139 mmol/L (ref 134–144)
eGFR: 54 mL/min/{1.73_m2} — ABNORMAL LOW (ref >59)

## 2020-07-15 ENCOUNTER — Other Ambulatory Visit: Payer: Self-pay | Admitting: Student

## 2020-07-31 ENCOUNTER — Telehealth: Payer: Self-pay | Admitting: Student

## 2020-07-31 DIAGNOSIS — I10 Essential (primary) hypertension: Secondary | ICD-10-CM

## 2020-07-31 MED ORDER — AMLODIPINE BESYLATE 5 MG PO TABS: 5.0000 mg | ORAL_TABLET | Freq: Every day | ORAL | 3 refills | Status: DC

## 2020-07-31 NOTE — Telephone Encounter (Signed)
   Patient dropped off a log of BP/HR after visit on 07/08/2020 and it was placed in my mailbox. I have not been in the office for about a month so am just now seeing results. Reviewed log today and BP mostly above goal with systolic BP as high as the 170's to 190s and diastolic BP as 100's to 110's on multiple occasions. Heart rates mostly in the 60's. Will have log scanned into system. Called and spoke with patient and recommended starting Amlodipine 5mg  daily. Patient was in agreement with this. He will keep another BP/HR log and send this to in 2-3 weeks.  [place], PA-C 07/31/2020 5:32 PM

## 2020-08-14 ENCOUNTER — Other Ambulatory Visit: Payer: Self-pay | Admitting: Student

## 2020-08-14 NOTE — Telephone Encounter (Signed)
This is Dr. Kelly's pt. °

## 2020-08-31 NOTE — Telephone Encounter (Signed)
Spoke with pt and advised per Marjie Skiff pt is to take Amlodipine 5mg  - 1 tablet by mouth daily in addition to other medications.  Pt verbalizes understanding and thanked for call.

## 2020-08-31 NOTE — Telephone Encounter (Signed)
Patient is following up regarding starting Amlodipine. He wants clarification on the instructions. Specifically, he wants to know if he needs to substitute another medication for it or if he needs to just add it on to the medications he has been taking. Please advise.

## 2020-09-02 ENCOUNTER — Ambulatory Visit: Payer: Medicare HMO | Attending: Internal Medicine

## 2020-09-02 ENCOUNTER — Other Ambulatory Visit (HOSPITAL_BASED_OUTPATIENT_CLINIC_OR_DEPARTMENT_OTHER): Payer: Self-pay

## 2020-09-02 ENCOUNTER — Other Ambulatory Visit: Payer: Self-pay

## 2020-09-02 DIAGNOSIS — Z23 Encounter for immunization: Secondary | ICD-10-CM

## 2020-09-02 MED ORDER — PFIZER-BIONT COVID-19 VAC-TRIS 30 MCG/0.3ML IM SUSP
INTRAMUSCULAR | 0 refills | Status: DC
  Filled 2020-09-02: qty 0.3, 1d supply, fill #0

## 2020-09-02 NOTE — Progress Notes (Signed)
   Covid-19 Vaccination Clinic  Name:  Drew Andrews    MRN: [id number] DOB: 1945-06-17  09/02/2020  Mr. [name] was observed post Covid-19 immunization for 15 minutes without incident. He was provided with Vaccine Information Sheet and instruction to access the V-Safe system.   Mr. [name] was instructed to call 911 with any severe reactions post vaccine: [name] Difficulty breathing  . Swelling of face and throat  . A fast heartbeat  . A bad rash all over body  . Dizziness and weakness   Immunizations Administered    Name Date Dose VIS Date Route   PFIZER Comrnaty(Gray TOP) Covid-19 Vaccine 09/02/2020 12:31 PM 0.3 mL 04/09/2020 Intramuscular   Manufacturer: [employer], [employer]   Lot: [id number]   NDC: [phone]

## 2020-09-03 ENCOUNTER — Ambulatory Visit: Payer: Medicare HMO

## 2020-10-19 ENCOUNTER — Other Ambulatory Visit (HOSPITAL_COMMUNITY): Payer: Medicare HMO

## 2020-10-20 ENCOUNTER — Encounter (HOSPITAL_COMMUNITY): Payer: Self-pay | Admitting: Cardiovascular Disease

## 2020-11-13 ENCOUNTER — Other Ambulatory Visit: Payer: Self-pay | Admitting: Student

## 2020-12-03 ENCOUNTER — Other Ambulatory Visit: Payer: Self-pay

## 2020-12-03 ENCOUNTER — Ambulatory Visit (HOSPITAL_COMMUNITY): Payer: Medicare HMO | Attending: Cardiovascular Disease

## 2020-12-03 DIAGNOSIS — Z952 Presence of prosthetic heart valve: Secondary | ICD-10-CM | POA: Insufficient documentation

## 2020-12-03 LAB — ECHOCARDIOGRAM COMPLETE
AR max vel: 1.36 cm2
AV Area VTI: 1.41 cm2
AV Area mean vel: 1.24 cm2
AV Mean grad: 10.9 mmHg
AV Peak grad: 20.5 mmHg
Ao pk vel: 2.27 m/s
Area-P 1/2: 2.87 cm2
S' Lateral: 2.5 cm

## 2020-12-15 ENCOUNTER — Telehealth: Payer: Self-pay | Admitting: Cardiovascular Disease

## 2020-12-15 NOTE — Telephone Encounter (Signed)
Pt is returning a call in regards to ECHO results 

## 2020-12-15 NOTE — Telephone Encounter (Signed)
Called patient, advised of ECHO results. Patient verbalized understanding.   

## 2021-01-13 DIAGNOSIS — Z125 Encounter for screening for malignant neoplasm of prostate: Secondary | ICD-10-CM | POA: Diagnosis not present

## 2021-01-13 DIAGNOSIS — E7800 Pure hypercholesterolemia, unspecified: Secondary | ICD-10-CM | POA: Diagnosis not present

## 2021-01-20 DIAGNOSIS — E875 Hyperkalemia: Secondary | ICD-10-CM | POA: Diagnosis not present

## 2021-01-20 DIAGNOSIS — R82998 Other abnormal findings in urine: Secondary | ICD-10-CM | POA: Diagnosis not present

## 2021-01-20 DIAGNOSIS — Z1339 Encounter for screening examination for other mental health and behavioral disorders: Secondary | ICD-10-CM | POA: Diagnosis not present

## 2021-01-20 DIAGNOSIS — I350 Nonrheumatic aortic (valve) stenosis: Secondary | ICD-10-CM | POA: Diagnosis not present

## 2021-01-20 DIAGNOSIS — I130 Hypertensive heart and chronic kidney disease with heart failure and stage 1 through stage 4 chronic kidney disease, or unspecified chronic kidney disease: Secondary | ICD-10-CM | POA: Diagnosis not present

## 2021-01-20 DIAGNOSIS — Z0000 Encounter for general adult medical examination without abnormal findings: Secondary | ICD-10-CM | POA: Diagnosis not present

## 2021-01-20 DIAGNOSIS — E7800 Pure hypercholesterolemia, unspecified: Secondary | ICD-10-CM | POA: Diagnosis not present

## 2021-01-20 DIAGNOSIS — E663 Overweight: Secondary | ICD-10-CM | POA: Diagnosis not present

## 2021-01-20 DIAGNOSIS — K219 Gastro-esophageal reflux disease without esophagitis: Secondary | ICD-10-CM | POA: Diagnosis not present

## 2021-01-20 DIAGNOSIS — Z952 Presence of prosthetic heart valve: Secondary | ICD-10-CM | POA: Diagnosis not present

## 2021-01-20 DIAGNOSIS — N1831 Chronic kidney disease, stage 3a: Secondary | ICD-10-CM | POA: Diagnosis not present

## 2021-01-20 DIAGNOSIS — Z1331 Encounter for screening for depression: Secondary | ICD-10-CM | POA: Diagnosis not present

## 2021-01-20 DIAGNOSIS — I517 Cardiomegaly: Secondary | ICD-10-CM | POA: Diagnosis not present

## 2021-01-20 DIAGNOSIS — Z1212 Encounter for screening for malignant neoplasm of rectum: Secondary | ICD-10-CM | POA: Diagnosis not present

## 2021-02-22 ENCOUNTER — Encounter: Payer: Self-pay | Admitting: Cardiovascular Disease

## 2021-02-22 ENCOUNTER — Ambulatory Visit: Payer: Medicare HMO | Admitting: Cardiovascular Disease

## 2021-02-22 ENCOUNTER — Other Ambulatory Visit: Payer: Self-pay

## 2021-02-22 DIAGNOSIS — K219 Gastro-esophageal reflux disease without esophagitis: Secondary | ICD-10-CM

## 2021-02-22 DIAGNOSIS — I10 Essential (primary) hypertension: Secondary | ICD-10-CM

## 2021-02-22 DIAGNOSIS — E7800 Pure hypercholesterolemia, unspecified: Secondary | ICD-10-CM

## 2021-02-22 DIAGNOSIS — I350 Nonrheumatic aortic (valve) stenosis: Secondary | ICD-10-CM

## 2021-02-22 MED ORDER — AMLODIPINE BESYLATE 5 MG PO TABS: 5.0000 mg | ORAL_TABLET | Freq: Every day | ORAL | 3 refills | Status: DC

## 2021-02-22 MED ORDER — CARVEDILOL 6.25 MG PO TABS
6.2500 mg | ORAL_TABLET | Freq: Two times a day (BID) | ORAL | 3 refills | Status: DC
Start: 2021-02-22 — End: 2021-10-27

## 2021-02-22 MED ORDER — LISINOPRIL 40 MG PO TABS
40.0000 mg | ORAL_TABLET | Freq: Every day | ORAL | 3 refills | Status: DC
Start: 2021-02-22 — End: 2021-10-27

## 2021-02-22 NOTE — Patient Instructions (Signed)
Medication Instructions:  Your Physician recommend you continue on your current medication as directed.    *If you need a refill on your cardiac medications before your next appointment, please call your pharmacy*   Follow-Up: At [facility], you and your health needs are our priority.  As part of our continuing mission to provide you with exceptional heart care, we have created designated Provider Care Teams.  These Care Teams include your primary Cardiologist (physician) and Advanced Practice Providers (APPs -  Physician Assistants and Nurse Practitioners) who all work together to provide you with the care you need, when you need it.  We recommend signing up for the patient portal called "MyChart".  Sign up information is provided on this After Visit Summary.  MyChart is used to connect with patients for Virtual Visits (Telemedicine).  Patients are able to view lab/test results, encounter notes, upcoming appointments, etc.  Non-urgent messages can be sent to your provider as well.   To learn more about what you can do with MyChart, go to [email or web address].    Your next appointment:   6 month(s)  The format for your next appointment:   In Person  Provider:   Thomas Kelly, MD     

## 2021-02-22 NOTE — Progress Notes (Signed)
Cardiology Office Note    Date:  02/22/2021   ID:  Drew Andrews, DOB 09-Oct-1945, MRN [id number]  PCP:  Haywood Pao, MD  Cardiologist:  Shelva Majestic, MD   8 month follow-up  History of Present Illness:  Drew Andrews is a 75 y.o. male who was first told of having a heart murmur back in the 1980s when he was planning to work at [employer] and had a physical exam at that time. I have seen him since 2005 when he was referred to me by [name] for cardiac murmur. Since 2005 he had undergone follow-up  echo Doppler studies to assess his aortic valve. In 2005 his aortic transvalvular gradient was 37 with a mean gradient of 18. Over the last 9 years, his aortic valve murmur gradually become more significant and he remained asymptomatic.  An echo in 11/03/2014showed normal LV function with  grade 2 diastolic dysfunction. His aortic valve again was moderately calcified. The mean gradient had increased to 47 and peak instantaneous gradient 86 giving a calculated aortic valve area of 0.63 cm. He did have mitral annular calcification. His left atrium was moderately dilated. He did have very mild RV dilatation.   When I saw him in November 2014 Drew Andrews was continuing to exercise but there was new development of exertional shortness of breath. At that time, I strongly recommended cardiac catheterization and he underwent right and left heart cardiac catheterization on 03/15/2013. This confirmed severe aortic valve stenosis mild pulmonary hypertension. He had mild chronic calcification but nonobstructive 20% narrowing in the LAD.    He underwent successful aortic valve replacement surgery by Dr. [name] 05/13/2013 and had a 25 mm [facility] Ease bovine pericardial valve inserted. He has done remarkably well following his valve replacement surgery. He denies any episodes of palpitations. He has resumed activity.   Since his valve replacement, he has  continued to feel well.  He denies any shortness of breath or chest pain.  He does exercise regularly.  Typically in the cold winter months.  He likes to exercise at cardiac rehabilitation for the months of January and February..  However, he has had several rare occurrences of diplopia it are short lived.  He has been taking aspirin 81 mg daily.  A repeat echo Doppler study on 12/23/2013.revealed an EF 55-60% with mild LVH.  His aortic prosthesis was well-seated and open well.  There was only trivial aortic insufficiency.  He did have mild dilatation of his left atrium and mitral annular calcification.  There also was some dilatation of his RV.     His echo in October 2016 continued to show excellent LV function with an EF of 55-60% and he had normal diastolic parameters.  The bioprosthetic AV was  functioning normally.  The gradients were felt to be very minimally elevated and unchanged from his previous study with a mean gradient of 11 and peak gradient of 23 mm.  There was no evidence for aortic insufficiency.  There was evidence for mitral annular calcification and mild LA dilatation.   He underwent a 2-D echo Doppler study on 08/02/2016.  He had normal LV function with EF of 60-65% and grade 2 diastolic dysfunction.  His aortic bioprosthesis was well-seated and was functioning well.  The mean gradient was 11 and peak gradient 20.  He had mild MR.  There was mild TR and mild LA dilatation.     He participates the maintenance phase of cardiac rehabilitation  over the winter months and will be ending this week.  Typically over the summer he rides his bike.  On days he does not do cardiac rehabilitation in the winter he often walks and if the weather is nice.  He may ride his bike occasionally.  He typically gained some weight over the winter months but loses weight over the summer when he is more active outside.  He denies any chest pain, presyncope or syncope, palpitations, or change in exercise tolerance.   Recently, he has noticed his blood pressure being slightly elevated in the 130s to 140s when he arrives at cardiac rehabilitation after exercise his blood pressure is normal.  He has increased his lisinopril to 7.5 mg.    When I saw him in October 2018, he denied any episodes of chest pain or shortness of breath.  He was exercising and walks for at least 30 minutes or bikes 7 days per week.  Over the winter months, he again participated in the cardiac rehabilitation program.  His blood pressure was mildly elevated and I titrated lisinopril to 15 mg daily.  He continues to take metoprolol succinate 50 mg.  He has been on generic Vytorin 10/40 for hyperlipidemia.    He underwent a follow-up echo Doppler study on January 08, 2018.  This showed normal systolic function with grade 2 diastolic dysfunction.  EF was 55 to 60%.  His aortic bioprosthesis was well-seated.  Mean gradient was 12.  There was no AI.  His a sending aorta was mildly dilated.  There was mild MR and mild to moderate biatrial enlargement.  He continues to be asymptomatic and feels well.  He is enjoying retirement.     Since I saw him in December 2019, he again participated in the cardiac rehabilitation program over the winter months.  He was now back riding his bike at least 30 minutes a day and also on days that he does not ride his bike he typically walks at a fast pace.    I  evaluated him in a telemedicine visit on Sep 11, 2018 which time he continued to be stable and denied any chest pain, PND, or orthopnea.  He was taking  lisinopril 20 mg and Toprol-XL 50 mg for hypertension.  His blood pressure has been well controlled and typically when he leaves cardiac rehab it would be approximately 120/70.  He continues to be on Vytorin 10/40 for hyperlipidemia.  He has a hiatal hernia and GERD which is well controlled with omeprazole.  His laboratory typically is done by Dr. Osborne Casco.  I last saw him in December 2020 and since his prior  evaluation he had remained stable.  He was continuing  to ride his bike but in the winter months seems to walk more due to the weather.  Typically walks for 30 minutes for 2.2 miles.  He underwent a follow-up echo Doppler study on March 14, 2019.  EF remains 60 to 65% with mild LVH of the basal septum.  Diastolic function was indeterminate.  Had a stable bioprosthetic aortic valve with normal gradients with a mean gradient of 10 and peak gradient of 18.7.  There was no perivalvular aortic insufficiency.  He had mild pulmonic regurgitation.  His ascending aorta measured 39 mm.  He has continued to be on generic Vytorin 10/40 and lipid studies in August 2020 showed a total cholesterol 129, HDL 43, LDL 72, and triglycerides 70.  He continues to be on Toprol-XL 50 mg and lisinopril 20  mg daily.  GERD is controlled with omeprazole.    I saw him in July 2021 and since his last evaluation he was either walking or biking on a daily basis.   Due to the Covid pandemic he did not do cardiac rehab over the winter.  He had recently had an insurance person come to the house.  Apparently they had done ABIs and noticed his right ABI was reduced at 0.85 and left ABI was 1.24.  They were concerned about possible peripheral vascular disease.  He sees Dr. Osborne Casco who has checked laboratory.  He continues to be on generic Vytorin 10/40, lisinopril 20 mg daily, metoprolol succinate 50 mg daily and takes omeprazole over-the-counter daily.  During that evaluation, his blood pressure was elevated and I recommended slight titration of lisinopril from 20 mg up to 30 mg daily for more optimal blood pressure control.  Following his evaluation, he underwent lower extremity arterial Doppler study on January 13, 2020.  This revealed noncompressible right ankle brachial index and left ankle brachial index but normal right toe brachial and left toe brachial indices.  I last saw him in June 22, 2020.  At that time he continued to feel  well and was without chest pain or shortness of breath.  He denied any palpitations.. He sees Dr. Osborne Casco who checks his laboratory.  He denies presyncope or syncope.  He continues to try to walk daily has not been biking recently.  He continues to be on Vytorin 10/40 hyperlipidemia and lisinopril 30 mg with metoprolol succinate 50 mg daily for hypertension.  GERD is treated with over-the-counter Prilosec.  I reviewed his lower extremity vascular ultrasound which revealed normal to brachial indices.   Since I last saw him, he was evaluated by Sande Rives and apparently was having some issues with blood pressure control.  During her evaluation, she recommended he discontinue metoprolol and in its place initiate carvedilol 6.25 mg twice a day.  His lisinopril was increased to 40 mg.  He subsequently kept blood pressure log and with blood pressure elevation amlodipine 5 mg was added to his regimen.  He tells me he subsequently saw Dr. Osborne Casco and apparently never discontinued all and was taking both metoprolol in addition to carvedilol.  At that time the metoprolol was discontinued.  He underwent a follow-up echo Doppler study on December 04, 2018 which showed normal LV function with EF 60 to 65%.  There was moderate concentric LVH, grade 2 diastolic dysfunction, moderate left atrial dilatation.  He had stable aortic valve replacement with a mean gradient of 10.9 and peak gradient of 20.5 mmHg.  Presently,  he feels well and denies chest pain or shortness of breath.  He believes he is starting to slow down.  He used to walk a mile and 12 and half minutes and now takes him approximately 15 minutes to walk a mile and he does feel tired.  He denies any chest tightness or dyspnea.  He continues to exercise at least 30 minutes a day.  He recently had laboratory with Dr. Osborne Casco.  Cholesterol was 141, triglycerides 92, HDL 51, and LDL 72.  He presents for evaluation.   Past Medical History:  Diagnosis Date    Bursitis of elbow 05/20/2013   Essential hypertension 03/12/2013   GERD (gastroesophageal reflux disease)    Hyperlipidemia    Hypertension    Left ventricular diastolic dysfunction, NYHA class 2 03/12/2013   LVH (left ventricular hypertrophy)    with aortic stenosis-bicuspid  PONV (postoperative nausea and vomiting)    as a child   S/P AVR (aortic valve replacement) 05/13/2013   S/P AVR, 05/13/13, 25 mm [facility]-Ease pericardial valve 05/13/2013   Seasonal allergies    Severe aortic stenosis 03/12/2013    Past Surgical History:  Procedure Laterality Date   AORTIC VALVE REPLACEMENT N/A 05/13/2013   Procedure: AORTIC VALVE REPLACEMENT (AVR);  Surgeon: [name], MD;  Location: [facility];  Service: Open Heart Surgery;  Laterality: N/A;   APPENDECTOMY  age 4   CARDIAC CATHETERIZATION     COLONOSCOPY     X 2   HIATAL HERNIA REPAIR     INTRAOPERATIVE TRANSESOPHAGEAL ECHOCARDIOGRAM N/A 05/13/2013   Procedure: INTRAOPERATIVE TRANSESOPHAGEAL ECHOCARDIOGRAM;  Surgeon: [name], MD;  Location: [facility];  Service: Open Heart Surgery;  Laterality: N/A;   LEFT HEART CATHETERIZATION WITH CORONARY ANGIOGRAM N/A 03/15/2013   Procedure: LEFT HEART CATHETERIZATION WITH CORONARY ANGIOGRAM;  Surgeon: [name], MD;  Location: [facility];  Service: Cardiovascular;  Laterality: N/A;   TONSILLECTOMY     TRANSTHORACIC ECHOCARDIOGRAM  03/04/2013   EF 54-49%, grade 2 diastolic dysfunction, AV with mod calcified leaflets & mild regurg, calcified MV annulus, LA mod dilated, RV mildly dilated    Current Medications: Outpatient Medications Prior to Visit  Medication Sig Dispense Refill   aspirin 81 MG tablet Take 81 mg by mouth daily.     diphenhydrAMINE (BENADRYL) 25 MG tablet Take 25 mg by mouth every 6 (six) hours as needed.     ezetimibe-simvastatin (VYTORIN) 10-40 MG tablet Take 1 tablet by mouth at bedtime. 90 tablet 3   Ibuprofen-Diphenhydramine Cit (ADVIL PM PO) Take 2-3 tablets by mouth  at bedtime as needed (for sleep).     omeprazole (PRILOSEC OTC) 20 MG tablet Take 20 mg by mouth daily.     amLODipine (NORVASC) 5 MG tablet Take 1 tablet (5 mg total) by mouth daily. 90 tablet 3   carvedilol (COREG) 6.25 MG tablet Take 1 tablet (6.25 mg total) by mouth 2 (two) times daily. 180 tablet 3   lisinopril (ZESTRIL) 40 MG tablet TAKE 1 TABLET BY MOUTH EVERY DAY 90 tablet 1   amoxicillin (AMOXIL) 500 MG tablet Take 4 tablets 1 hour prior to dental procedure (Patient not taking: Reported on 02/22/2021) 8 tablet 1   COVID-19 mRNA Vac-TriS, Pfizer, (PFIZER-BIONT COVID-19 VAC-TRIS) SUSP injection Inject into the muscle. (Patient not taking: Reported on 02/22/2021) 0.3 mL 0   No facility-administered medications prior to visit.     Allergies:   Scopolamine   Social History   Socioeconomic History   Marital status: Married    Spouse name: Not on file   Number of children: 1   Years of education: college   Highest education level: Not on file  Occupational History   Not on file  Tobacco Use   Smoking status: Former    Packs/day: 1.00    Types: Cigarettes    Quit date: 02/10/1974    Years since quitting: 47.0   Smokeless tobacco: Never  Substance and Sexual Activity   Alcohol use: Yes    Comment: Drinks 1 bottle of wine daily and 1 beer   Drug use: No   Sexual activity: Not on file  Other Topics Concern   Not on file  Social History Narrative   Not on file   Social Determinants of Health   Financial Resource Strain: Not on file  Food Insecurity: Not on file  Transportation Needs: Not on file  Physical Activity: Not on file  Stress: Not on file  Social Connections: Not on file      Additional social history is notable in that he has a Paediatric nurse in IT sales professional. He is retired from [employer]. He quit tobacco in 1979. He continues to exercise 7 days per week.  Family History:  The patient's family history includes Acute myelogenous  leukemia in his brother; Diabetes in his mother; Heart attack in his father and maternal grandfather; Hyperlipidemia in his mother; Hypertension in his mother; Pneumonia in his maternal grandmother.   ROS General: Negative; No fevers, chills, or night sweats;  HEENT: Negative; No changes in vision or hearing, sinus congestion, difficulty swallowing Pulmonary: Negative; No cough, wheezing, shortness of breath, hemoptysis Cardiovascular: Negative; No chest pain, presyncope, syncope, palpitations GI: Negative; No nausea, vomiting, diarrhea, or abdominal pain GU: Negative; No dysuria, hematuria, or difficulty voiding Musculoskeletal: Negative; no myalgias, joint pain, or weakness Hematologic/Oncology: Negative; no easy bruising, bleeding Endocrine: Negative; no heat/cold intolerance; no diabetes Neuro: Negative; no changes in balance, headaches Skin: Negative; No rashes or skin lesions Psychiatric: Negative; No behavioral problems, depression Sleep: Negative; No snoring, daytime sleepiness, hypersomnolence, bruxism, restless legs, hypnogognic hallucinations, no cataplexy Other comprehensive 14 point system review is negative.   PHYSICAL EXAM:   VS:  BP (!) 142/82 (BP Location: Left Arm, Patient Position: Sitting, Cuff Size: Normal)   Pulse 64   Ht _0  (1.753 m)   Wt 187 lb 12.8 oz (85.2 kg)   SpO2 100%   BMI 27.73 kg/m     Repeat blood pressure by me was 126/74  Wt Readings from Last 3 Encounters:  02/22/21 187 lb 12.8 oz (85.2 kg)  06/22/20 195 lb (88.5 kg)  06/08/20 183 lb 6.4 oz (83.2 kg)     General: Alert, oriented, no distress.  Skin: normal turgor, no rashes, warm and dry HEENT: Normocephalic, atraumatic. Pupils equal round and reactive to light; sclera anicteric; extraocular muscles intact;  Nose without nasal septal hypertrophy Mouth/Parynx benign; Mallinpatti scale 3 Neck: No JVD, no carotid bruits; normal carotid upstroke Lungs: clear to ausculatation and  percussion; no wheezing or rales Chest wall: without tenderness to palpitation Heart: PMI not displaced, RRR, s1 s2 normal, 1/6 systolic murmur, no diastolic murmur, no rubs, gallops, thrills, or heaves Abdomen: soft, nontender; no hepatosplenomehaly, BS+; abdominal aorta nontender and not dilated by palpation. Back: no CVA tenderness Pulses 2+ Musculoskeletal: full range of motion, normal strength, no joint deformities Extremities: no clubbing cyanosis or edema, Homan's sign negative  Neurologic: grossly nonfocal; Cranial nerves grossly wnl Psychologic: Normal mood and affect   Studies/Labs Reviewed:   February 22, 2021 ECG (independently read by me): Sinus rhythm at 64, 1 st degree AV block, PR 276 msec  June 08, 2020 ECG (independently read by me): Sinus Bradycardia at 50; 1st degree AV block PR 272 msec; QTc 417 msec  July 2022 ECG (independently read by me): Sinus bradycardia at 54 bpm with first-degree AV block; PR interval 282 ms.  LVH by voltage criteria in aVL.  December 2020 ECG (independently read by me): Sinus bradycardia at 58 bpm.  First-degree AV block with a parable of 256 ms.  LVH by voltage.  QTc interval 402 ms.  December 2019 ECG (independently read by me): Sinus bradycardia 59 bpm with first-degree AV block.  PR interval 278 ms.  LVH by voltage criteria.   March 2019 ECG (independently read by me):  Sinus rhythm at 63 bpm.  First-degree AV block.  PAC.  Borderline voltage criteria for LVH.   October 2018 ECG (independently read by me): Sinus bradycardia 58 bpm.  First-degree AV block.  LVH by voltage.   April 2018 ECG (independently read by me): Sinus bradycardia at 58 bpm.  First degree AV block Borderline LVH by voltage criteria.   July 2017 ECG (independently read by me): Sinus bradycardia at 53 bpm.  Mild first-degree AV block with a PR interval at 226 ms.  Borderline LVH by voltage criteria.  Mild T wave abnormality in 3 and aVF.   November 2016 ECG  (independently read by me): Normal sinus rhythm at 65 bpm.  First-degree AV block with a PR interval at 244 ms.   October 2015 ECG disease independently read by me): Normal sinus rhythm at 57 beats per minute.  Mild first degree AV block.   Prior 07/08/2013 EKG (independently read by the) normal sinus rhythm. Non-specific ST-T changes inferolaterally.   Prior 03/12/13 ECG: Sinus rhythm at 66 beats per minute. First-degree AV block. LVH by voltage criteria. Mild nondiagnostic T changes.  Recent Labs: BMP Latest Ref Rng & Units 07/08/2020 06/22/2020 03/20/2017  Glucose 65 - 99 mg/dL 98 82 97  BUN 8 - 27 mg/dL _0 Creatinine 0.76 - 1.27 mg/dL 1.36(H) 1.12 1.22  BUN/Creat Ratio 10 - _1 Sodium 134 - 144 mmol/L 139 139 142  Potassium 3.5 - 5.2 mmol/L 4.4 CANCELED 4.8  Chloride 96 - 106 mmol/L 103 104 105  CO2 20 - 29 mmol/L 19(L) 16(L) 20  Calcium 8.6 - 10.2 mg/dL 9.2 9.3 9.4     Hepatic Function Latest Ref Rng & Units 07/08/2013 05/09/2013 03/12/2013  Total Protein 6.0 - 8.3 g/dL 6.5 7.4 7.0  Albumin 3.5 - 5.2 g/dL 4.1 4.2 4.6  AST 0 - 37 U/L _2 ALT 0 - 53 U/L 21 33 18  Alk Phosphatase 39 - 117 U/L 44 43 39  Total Bilirubin 0.2 - 1.2 mg/dL 0.4 0.5 0.7    CBC Latest Ref Rng & Units 08/19/2013 07/08/2013 05/17/2013  WBC 4.0 - 10.5 K/uL 6.8 6.8 6.7  Hemoglobin 13.0 - 17.0 g/dL 13.4 12.3(L) 9.4(L)  Hematocrit 39.0 - 52.0 % 39.3 36.7(L) 27.3(L)  Platelets 150 - 400 K/uL 170 166 84(L)   Lab Results  Component Value Date   MCV 92.0 08/19/2013   MCV 95.1 07/08/2013   MCV 96.8 05/17/2013   Lab Results  Component Value Date   TSH 1.554 07/08/2013   Lab Results  Component Value Date   HGBA1C 5.5 05/09/2013     BNP No results found for: BNP  ProBNP No results found for: PROBNP   Lipid Panel     Component Value Date/Time   CHOL 122 07/08/2013 0856   TRIG 89 07/08/2013 0856   HDL 42 07/08/2013 0856   CHOLHDL 2.9 07/08/2013 0856   VLDL 18 07/08/2013 0856    LDLCALC 62 07/08/2013 0856     RADIOLOGY: No results found.   Additional studies/ records that were reviewed today include:   ECHO SUMMARY:03/14/2019 Left Ventricle: Normal LV size and function with EF 60-65%. There is mild hypertrophy of the basal septum. Diastolic dysfunction is inderterminant. There is increased LV filling pressures. Right Ventricle: Normal RV size and RVF. Right Atrium: Normal size. Left Atrium: Severely dilated. Mitral Valve: Moderate thickening and calcification of the anterior MV leaflet with moderate mitral annular calcification  and trivial MR. Aortic Valve: S/P bioprosthetic AVR that appears to be functioning normally. Aortic valve mean gradient measures 10.0 mmHg. Aortic valve peak gradient measures 18.7 mmHg. Aortic valve area, by VTI measures 1.32 cm. There is no perivalvular AI. Pulmonic Valve: There is mild pulmonic regurgitation. Tricuspid Valve: There is trivial tricuspid regurgitation. Aorta: Mildly dilated ascending aorta at 42m.   FINDINGS:   Left Ventricle: Left ventricular ejection fraction, by visual estimation, is 60 to 65%. The left ventricle has normal function. There is mildly increased left ventricular hypertrophy of the basal septum. Left ventricular diastolic parameters are  indeterminate. Elevated left ventricular end-diastolic pressure.   Right Ventricle: The right ventricular size is normal. No increase in right ventricular wall thickness. Global RV systolic function is has normal systolic function. The tricuspid regurgitant velocity is 2.07 m/s, and with an assumed right atrial pressure  of 3 mmHg, the estimated right ventricular systolic pressure is normal at 20.2 mmHg.   Left Atrium: Left atrial size was severely dilated.   Right Atrium: Right atrial size was normal in size   Pericardium: There is no evidence of pericardial effusion.   Mitral Valve: The mitral valve is normal in structure. There is moderate thickening of the  anterior mitral valve leaflet(s). There is moderate calcification of the anterior mitral valve leaflet(s). Moderate mitral annular calcification. No evidence of  mitral valve stenosis by observation. MV peak gradient, 6.7 mmHg. Trace mitral valve regurgitation.   Tricuspid Valve: The tricuspid valve is normal in structure. Tricuspid valve regurgitation is mild.   Aortic Valve: The aortic valve is normal in structure. Aortic valve regurgitation is not visualized. The aortic valve is structurally normal, with no evidence of sclerosis or stenosis. Aortic valve mean gradient measures 10.0 mmHg. Aortic valve peak  gradient measures 18.7 mmHg. Aortic valve area, by VTI measures 1.32 cm.   Pulmonic Valve: The pulmonic valve was normal in structure. Pulmonic valve regurgitation is mild.   Aorta: Aortic dilatation noted. There is mild dilatation of the ascending aorta measuring 39 mm.   Venous: The inferior vena cava is normal in size with greater than 50% respiratory variability, suggesting right atrial pressure of 3 mmHg.   IAS/Shunts: No atrial level shunt detected by color flow Doppler. No ventricular septal defect is seen or detected. There is no evidence of an atrial septal defect.       LEFT VENTRICLE PLAX 2D LVIDd:         4.12 cm  Diastology LVIDs:         3.08 cm  LV e' lateral:   11.20 cm/s LV PW:         0.97 cm  LV E/e' lateral: 9.1 LV IVS:        1.25 cm  LV e' medial:    5.98 cm/s LVOT diam:     2.00 cm  LV E/e' medial:  17.1 LV SV:         38 ml LV SV Index:   18.76 LVOT Area:     3.14 cm     RIGHT VENTRICLE RV Basal diam:  4.88 cm RV S prime:     12.50 cm/s TAPSE (M-mode): 2.5 cm RVSP:           20.2 mmHg   LEFT ATRIUM              Index       RIGHT ATRIUM           Index LA diam:  5.00 cm  2.52 cm/m  RA Pressure: 3.00 mmHg LA Vol (A2C):   95.6 ml  48.15 ml/m RA Area:     19.05 cm LA Vol (A4C):   101.0 ml 50.87 ml/m RA Volume:   50.85 ml  25.61 ml/m LA  Biplane Vol: 101.0 ml 50.87 ml/m  AORTIC VALVE AV Area (Vmax):    1.22 cm AV Area (Vmean):   1.30 cm AV Area (VTI):     1.32 cm AV Vmax:           216.00 cm/s AV Vmean:          143.000 cm/s AV VTI:            0.516 m AV Peak Grad:      18.7 mmHg AV Mean Grad:      10.0 mmHg LVOT Vmax:         84.20 cm/s LVOT Vmean:        59.000 cm/s LVOT VTI:          0.217 m LVOT/AV VTI ratio: 0.42   AORTA Ao Root diam: 3.50 cm   MITRAL VALVE                         TRICUSPID VALVE MV Area (PHT): 2.80 cm              TR Peak grad:   17.2 mmHg MV Peak grad:  6.7 mmHg              TR Vmax:        263.00 cm/s MV Mean grad:  2.0 mmHg              Estimated RAP:  3.00 mmHg MV Vmax:       1.29 m/s              RVSP:           20.2 mmHg MV Vmean:      67.4 cm/s MV VTI:        0.42 m                SHUNTS MV PHT:        78.59 msec            Systemic VTI:  0.22 m MV Decel Time: 271 msec              Systemic Diam: 2.00 cm MV E velocity: 102.00 cm/s 103 cm/s MV A velocity: 112.00 cm/s 70.3 cm/s MV E/A ratio:  0.91        1.5    LE Vascular US: 01/13/2020  Summary:  Right: Resting right ankle-brachial index indicates noncompressible right  lower extremity arteries. The right toe-brachial index is normal.   Left: Resting left ankle-brachial index indicates noncompressible left  lower extremity arteries. The left toe-brachial index is normal.    ECHO: 12/03/2020 IMPRESSIONS   1. Left ventricular ejection fraction, by estimation, is 60 to 65%. The  left ventricle has normal function. The left ventricle has no regional  wall motion abnormalities. There is moderate concentric left ventricular  hypertrophy. Left ventricular  diastolic parameters are consistent with Grade II diastolic dysfunction  (pseudonormalization). Elevated left atrial pressure.   2. Right ventricular systolic function is normal. The right ventricular  size is normal.   3. Left atrial size was moderately dilated.   4.  The mitral valve is normal in structure. Trivial mitral valve  regurgitation. No  evidence of mitral stenosis.   5. The aortic valve has been repaired/replaced. Aortic valve  regurgitation is not visualized. No aortic stenosis is present. There is a  bioprosthetic valve present in the aortic position. Echo findings are  consistent with normal structure and function  of the aortic valve prosthesis. Aortic valve mean gradient measures 10.9  mmHg. Aortic valve Vmax measures 2.27 m/s. Aortic valve acceleration time  measures 92 msec.   6. There is borderline dilatation of the ascending aorta, measuring 37  mm.   7. The inferior vena cava is normal in size with greater than 50%  respiratory variability, suggesting right atrial pressure of 3 mmHg.   Comparison(s): No significant change from prior study.   ASSESSMENT:    1. Severe aortic stenosis: s/p AVR 05/13/2013 with a 25 mm [facility] Ease bovine pericardial valve   2. Primary hypertension   3. Pure hypercholesterolemia   4. GERD without esophagitis     PLAN:  Drew Andrews is a 75 year old gentleman who developed severe aortic stenosis and underwent successful AVR with a 25 mm [facility] Ease bovine pericardial tissue valve on May 13, 2013.  He has a history of hypertension and hyperlipidemia.  On his echo Doppler study of March 14, 2019 his bioprosthetic valve remained stable with normal gradients and without evidence for perivalvular leak.  EF was 60 to 65% with mild left ventricular hypertrophy of the basal septum.  He had moderate mitral annular calcification with trivial MR, mild PR and his ascending aorta measured 39 mm.  Most recent echo Doppler study with him today December 03, 2020 which essentially remained stable and showed normal LV function with EF 60 to 65%.  There was moderate concentric LVH with grade 2 diastolic dysfunction and moderate left atrial dilatation.  His aortic valve gradients were stable with a mean  gradient of 10.9 and peak gradient of 20.5.  Since I last saw him, his medications were adjusted for increased blood pressure and apparently when he was changed from metoprolol to carvedilol he had inadvertently forgot to stop the metoprolol.  However subsequently this was rectified when he saw Dr. Osborne Casco.  Presently, his blood pressure is well controlled and on repeat exam by me was 126/74 on his regimen consisting of amlodipine 5 mg, carvedilol 6.25 mg twice a day, and lisinopril 40 mg.  He continues to be on combination Zetia/simvastatin 10/40 for hyperlipidemia recent laboratory by Dr. Osborne Casco was reviewed which revealed a total cholesterol 141, triglycerides 92, HDL 51 and LDL 7.2 on January 13, 2021.  He continues to exercise at minimum 30 minutes/day.  He does feel that he is starting to slow down.  He denies any exertional dyspnea but after his long walks he does experience more fatigue and tiredness.  He denies any palpitations.  He has GERD for which he takes omeprazole.  His ECG remained stable with sinus rhythm and first-degree AV block.  I will see him in 6 months for reevaluation or sooner as necessary    Presently, his blood pressure is controlled today on his increased regimen of lisinopril 30 mg and he continues to take metoprolol succinate 50 mg daily.  His ECG shows sinus bradycardia at 50 bpm with first-degree AV block.  He remains asymptomatic without chest pain, shortness of breath, PND orthopnea, palpitations, presyncope or syncope.  I reviewed his lower extremity vascular ultrasound which is revealed normal toe brachial indices.  He is not having any claudication symptoms.  He continues to  be on Vytorin 10/40 for hyperlipidemia.  Dr. Osborne Casco has been checking his laboratory.  Target LDL ideally is less than 70.  He continues to be active and does some activity every day.  He has not been riding his bike during the winter months and no longer was doing the cardiac rehab during the winter  months during the COVID-19 pandemic.  He will continue current therapy.  I will see him in 6 months for follow-up evaluation and prior to that office visit he will undergo a follow-up echo Doppler study for reassessment of his aortic valve replacement in addition to systolic and diastolic function.  His GERD continues to be controlled with omeprazole.  I will see him in follow-up of his echo and further recommendations were made at that time.  Medication Adjustments/Labs and Tests Ordered: Current medicines are reviewed at length with the patient today.  Concerns regarding medicines are outlined above.  Medication changes, Labs and Tests ordered today are listed in the Patient Instructions below. Patient Instructions  Medication Instructions:  Your Physician recommend you continue on your current medication as directed.    *If you need a refill on your cardiac medications before your next appointment, please call your pharmacy*   Follow-Up: At [facility], you and your health needs are our priority.  As part of our continuing mission to provide you with exceptional heart care, we have created designated Provider Care Teams.  These Care Teams include your primary Cardiologist (physician) and Advanced Practice Providers (APPs -  Physician Assistants and Nurse Practitioners) who all work together to provide you with the care you need, when you need it.  We recommend signing up for the patient portal called "MyChart".  Sign up information is provided on this After Visit Summary.  MyChart is used to connect with patients for Virtual Visits (Telemedicine).  Patients are able to view lab/test results, encounter notes, upcoming appointments, etc.  Non-urgent messages can be sent to your provider as well.   To learn more about what you can do with MyChart, go to [email or web address].    Your next appointment:   6 month(s)  The format for your next appointment:   In Person  Provider:   Shelva Majestic, MD     Signed, Shelva Majestic, MD  02/22/2021 6:23 PM    [facility] [address], [place], [place], [place]  [address] Phone: [phone]

## 2021-02-26 ENCOUNTER — Ambulatory Visit: Payer: Medicare HMO | Attending: Internal Medicine

## 2021-02-26 ENCOUNTER — Telehealth: Payer: Self-pay

## 2021-02-26 ENCOUNTER — Other Ambulatory Visit (HOSPITAL_BASED_OUTPATIENT_CLINIC_OR_DEPARTMENT_OTHER): Payer: Self-pay

## 2021-02-26 ENCOUNTER — Other Ambulatory Visit: Payer: Self-pay

## 2021-02-26 DIAGNOSIS — Z23 Encounter for immunization: Secondary | ICD-10-CM

## 2021-02-26 MED ORDER — PFIZER COVID-19 VAC BIVALENT 30 MCG/0.3ML IM SUSP
INTRAMUSCULAR | 0 refills | Status: DC
  Filled 2021-02-26: qty 0.3, 1d supply, fill #0

## 2021-02-26 NOTE — Telephone Encounter (Addendum)
RECEIVD FAX FROM [employer] SHOWED THAT PT HAS FILLED METOPROLOL AND CARVEDILOL. PT SHOULD NOT BE TAKING BOTH. LOOKS LIKE DR WARREN FILLED(BEFORE OUR LAST APPT) THIS, SHOULD NOT HAVE BEEN. °TRIED TO CALL PT x2 TO ASK PT ABOUT MEDICATIONS AND PT HUNG UP AND DID NOT RESPOND x2 CALLS. WILL ASSUME THAT HE IS NOT TAKING METOPROLOL AND TAKING  JUST THE CARVEDILOL AS PER OUR LAST APPT.  °

## 2021-02-26 NOTE — Telephone Encounter (Incomplete Revision)
RECEIVD FAX FROM [employer] SHOWED THAT PT HAS FILLED METOPROLOL AND CARVEDILOL. PT SHOULD NOT BE TAKING BOTH. LOOKS LIKE DR WARREN FILLED(BEFORE OUR LAST APPT) THIS, SHOULD NOT HAVE BEEN. °TRIED TO CALL PT x2 TO ASK PT ABOUT MEDICATIONS AND PT HUNG UP AND DID NOT RESPOND x2 CALLS. WILL ASSUME THAT HE IS NOT TAKING METOPROLOL AND TAKING  JUST THE CARVEDILOL AS PER OUR LAST APPT.  °

## 2021-02-26 NOTE — Telephone Encounter (Incomplete Revision)
RECEIVD FAX FROM [employer] SHOWED THAT PT HAS FILLED METOPROLOL AND CARVEDILOL. PT SHOULD NOT BE TAKING BOTH. LOOKS LIKE DR Broadus John FILLED(BEFORE OUR LAST APPT) THIS, SHOULD NOT HAVE BEEN. TRIED TO CALL PT x2 TO ASK PT ABOUT MEDICATIONS AND PT HUNG UP AND DID NOT RESPOND x2 CALLS. WILL ASSUME THAT HE IS NOT TAKING METOPROLOL AND TAKING  JUST THE CARVEDILOL AS PER OUR LAST APPT.

## 2021-02-26 NOTE — Progress Notes (Signed)
   Covid-19 Vaccination Clinic  Name:  Drew Andrews    MRN: [id number] DOB: 1946-02-03  02/26/2021  Drew Andrews was observed post Covid-19 immunization for 15 minutes without incident. He was provided with Vaccine Information Sheet and instruction to access the V-Safe system.   Drew Andrews was instructed to call 911 with any severe reactions post vaccine: Difficulty breathing  Swelling of face and throat  A fast heartbeat  A bad rash all over body  Dizziness and weakness   Immunizations Administered     Name Date Dose VIS Date Route   Pfizer Covid-19 Vaccine Bivalent Booster 02/26/2021 12:20 PM 0.3 mL 12/30/2020 Intramuscular   Manufacturer: [employer], [employer]   Lot: [id number]   NDC: [phone]

## 2021-03-02 NOTE — Telephone Encounter (Signed)
LM2CB to fin out about medications

## 2021-03-02 NOTE — Telephone Encounter (Signed)
Called pt back and he states that he is only taking the Carvedilol

## 2021-03-02 NOTE — Telephone Encounter (Signed)
Patient is returning call.  °

## 2021-04-06 ENCOUNTER — Encounter: Payer: Self-pay | Admitting: Internal Medicine

## 2021-05-13 DIAGNOSIS — H524 Presbyopia: Secondary | ICD-10-CM | POA: Diagnosis not present

## 2021-05-13 DIAGNOSIS — Z961 Presence of intraocular lens: Secondary | ICD-10-CM | POA: Diagnosis not present

## 2021-05-13 DIAGNOSIS — Z0100 Encounter for examination of eyes and vision without abnormal findings: Secondary | ICD-10-CM | POA: Diagnosis not present

## 2021-05-13 DIAGNOSIS — H52203 Unspecified astigmatism, bilateral: Secondary | ICD-10-CM | POA: Diagnosis not present

## 2021-07-12 ENCOUNTER — Encounter: Payer: Self-pay | Admitting: Internal Medicine

## 2021-10-27 ENCOUNTER — Ambulatory Visit: Payer: Medicare HMO | Admitting: Cardiovascular Disease

## 2021-10-27 ENCOUNTER — Encounter: Payer: Self-pay | Admitting: Cardiovascular Disease

## 2021-10-27 VITALS — BP 104/68 | HR 66 | Ht 69.0 in | Wt 184.6 lb

## 2021-10-27 DIAGNOSIS — I440 Atrioventricular block, first degree: Secondary | ICD-10-CM | POA: Diagnosis not present

## 2021-10-27 DIAGNOSIS — E7800 Pure hypercholesterolemia, unspecified: Secondary | ICD-10-CM

## 2021-10-27 DIAGNOSIS — I10 Essential (primary) hypertension: Secondary | ICD-10-CM | POA: Diagnosis not present

## 2021-10-27 DIAGNOSIS — K219 Gastro-esophageal reflux disease without esophagitis: Secondary | ICD-10-CM

## 2021-10-27 DIAGNOSIS — I350 Nonrheumatic aortic (valve) stenosis: Secondary | ICD-10-CM | POA: Diagnosis not present

## 2021-10-27 MED ORDER — CARVEDILOL 6.25 MG PO TABS: 6.2500 mg | ORAL_TABLET | Freq: Two times a day (BID) | ORAL | 3 refills | Status: DC

## 2021-10-27 MED ORDER — LISINOPRIL 40 MG PO TABS: 40.0000 mg | ORAL_TABLET | Freq: Every day | ORAL | 3 refills | Status: DC

## 2021-10-27 MED ORDER — AMLODIPINE BESYLATE 5 MG PO TABS: 5.0000 mg | ORAL_TABLET | Freq: Every day | ORAL | 3 refills | Status: DC

## 2021-10-27 NOTE — Progress Notes (Signed)
Cardiology Office Note    Date:  10/27/2021   ID:  Drew Andrews, DOB 12/13/1945, MRN [id number]  PCP:  Drew Pao, MD  Cardiologist:  Drew Majestic, MD   8 month follow-up  History of Present Illness:  Drew Andrews is a 76 y.o. male who was first told of having a heart murmur back in the 1980s when he was planning to work at [employer] and had a physical exam at that time. I have seen him since 2005 when he was referred to me by Drew Andrews for cardiac murmur. Since 2005 he had undergone follow-up  echo Doppler studies to assess his aortic valve. In 2005 his aortic transvalvular gradient was 37 with a mean gradient of 18. Over the last 9 years, his aortic valve murmur gradually become more significant and he remained asymptomatic.  An echo in 11/03/2014showed normal LV function with  grade 2 diastolic dysfunction. His aortic valve again was moderately calcified. The mean gradient had increased to 47 and peak instantaneous gradient 86 giving a calculated aortic valve area of 0.63 cm. He did have mitral annular calcification. His left atrium was moderately dilated. He did have very mild RV dilatation.   When I saw him in November 2014 Drew Andrews was continuing to exercise but there was new development of exertional shortness of breath. At that time, I strongly recommended cardiac catheterization and he underwent right and left heart cardiac catheterization on 03/15/2013. This confirmed severe aortic valve stenosis mild pulmonary hypertension. He had mild chronic calcification but nonobstructive 20% narrowing in the LAD.    He underwent successful aortic valve replacement surgery by Drew Andrews 05/13/2013 and had a 25 mm Drew Andrews bovine pericardial valve inserted. He has done remarkably well following his valve replacement surgery. He denies any episodes of palpitations. He has resumed activity.   Since his valve replacement, he has  continued to feel well.  He denies any shortness of breath or chest pain.  He does exercise regularly.  Typically in the cold winter months.  He likes to exercise at cardiac rehabilitation for the months of January and February..  However, he has had several rare occurrences of diplopia it are short lived.  He has been taking aspirin 81 mg daily.  A repeat echo Doppler study on 12/23/2013.revealed an EF 55-60% with mild LVH.  His aortic prosthesis was well-seated and open well.  There was only trivial aortic insufficiency.  He did have mild dilatation of his left atrium and mitral annular calcification.  There also was some dilatation of his RV.     His echo in October 2016 continued to show excellent LV function with an EF of 55-60% and he had normal diastolic parameters.  The bioprosthetic AV was  functioning normally.  The gradients were felt to be very minimally elevated and unchanged from his previous study with a mean gradient of 11 and peak gradient of 23 mm.  There was no evidence for aortic insufficiency.  There was evidence for mitral annular calcification and mild LA dilatation.   He underwent a 2-D echo Doppler study on 08/02/2016.  He had normal LV function with EF of 60-65% and grade 2 diastolic dysfunction.  His aortic bioprosthesis was well-seated and was functioning well.  The mean gradient was 11 and peak gradient 20.  He had mild MR.  There was mild TR and mild LA dilatation.     He participates the maintenance phase of cardiac rehabilitation  over the winter months and will be ending this week.  Typically over the summer he rides his bike.  On days he does not do cardiac rehabilitation in the winter he often walks and if the weather is nice.  He may ride his bike occasionally.  He typically gained some weight over the winter months but loses weight over the summer when he is more active outside.  He denies any chest pain, presyncope or syncope, palpitations, or change in exercise tolerance.   Recently, he has noticed his blood pressure being slightly elevated in the 130s to 140s when he arrives at cardiac rehabilitation after exercise his blood pressure is normal.  He has increased his lisinopril to 7.5 mg.    When I saw him in October 2018, he denied any episodes of chest pain or shortness of breath.  He was exercising and walks for at least 30 minutes or bikes 7 days per week.  Over the winter months, he again participated in the cardiac rehabilitation program.  His blood pressure was mildly elevated and I titrated lisinopril to 15 mg daily.  He continues to take metoprolol succinate 50 mg.  He has been on generic Vytorin 10/40 for hyperlipidemia.    He underwent a follow-up echo Doppler study on January 08, 2018.  This showed normal systolic function with grade 2 diastolic dysfunction.  EF was 55 to 60%.  His aortic bioprosthesis was well-seated.  Mean gradient was 12.  There was no AI.  His a sending aorta was mildly dilated.  There was mild MR and mild to moderate biatrial enlargement.  He continues to be asymptomatic and feels well.  He is enjoying retirement.     Since I saw him in December 2019, he again participated in the cardiac rehabilitation program over the winter months.  He was now back riding his bike at least 30 minutes a day and also on days that he does not ride his bike he typically walks at a fast pace.    I evaluated him in a telemedicine visit on Sep 11, 2018 which time he continued to be stable and denied any chest pain, PND, or orthopnea.  He was taking  lisinopril 20 mg and Toprol-XL 50 mg for hypertension.  His blood pressure has been well controlled and typically when he leaves cardiac rehab it would be approximately 120/70.  He continues to be on Vytorin 10/40 for hyperlipidemia.  He has a hiatal hernia and GERD which is well controlled with omeprazole.  His laboratory typically is done by Dr. Osborne Andrews.  I saw him in December 2020 and since his prior evaluation he  had remained stable.  He was continuing  to ride his bike but in the winter months seems to walk more due to the weather.  Typically walks for 30 minutes for 2.2 miles.  He underwent a follow-up echo Doppler study on March 14, 2019.  EF remains 60 to 65% with mild LVH of the basal septum.  Diastolic function was indeterminate.  Had a stable bioprosthetic aortic valve with normal gradients with a mean gradient of 10 and peak gradient of 18.7.  There was no perivalvular aortic insufficiency.  He had mild pulmonic regurgitation.  His ascending aorta measured 39 mm.  He has continued to be on generic Vytorin 10/40 and lipid studies in August 2020 showed a total cholesterol 129, HDL 43, LDL 72, and triglycerides 70.  He continues to be on Toprol-XL 50 mg and lisinopril 20 mg daily.  GERD is controlled with omeprazole.    I saw him in July 2021 and since his last evaluation he was either walking or biking on a daily basis.   Due to the Covid pandemic he did not do cardiac rehab over the winter.  He had recently had an insurance person come to the house.  Apparently they had done ABIs and noticed his right ABI was reduced at 0.85 and left ABI was 1.24.  They were concerned about possible peripheral vascular disease.  He sees Dr. Osborne Andrews who has checked laboratory.  He continues to be on generic Vytorin 10/40, lisinopril 20 mg daily, metoprolol succinate 50 mg daily and takes omeprazole over-the-counter daily.  During that evaluation, his blood pressure was elevated and I recommended slight titration of lisinopril from 20 mg up to 30 mg daily for more optimal blood pressure control.  Following his evaluation, he underwent lower extremity arterial Doppler study on January 13, 2020.  This revealed noncompressible right ankle brachial index and left ankle brachial index but normal right toe brachial and left toe brachial indices.  When I saw him in June 22, 2020 he continued to feel well and was without chest pain  or shortness of breath.  He denied any palpitations.. He sees Dr. Osborne Andrews who checks his laboratory.  He denies presyncope or syncope.  He continues to try to walk daily has not been biking recently.  He continues to be on Vytorin 10/40 hyperlipidemia and lisinopril 30 mg with metoprolol succinate 50 mg daily for hypertension.  GERD is treated with over-the-counter Prilosec.  I reviewed his lower extremity vascular ultrasound which revealed normal to brachial indices.   He was evaluated by Sande Rives and apparently was having some issues with blood pressure control.  During her evaluation, she recommended he discontinue metoprolol and in its place initiate carvedilol 6.25 mg twice a day.  His lisinopril was increased to 40 mg.  He subsequently kept blood pressure log and with blood pressure elevation amlodipine 5 mg was added to his regimen.  He tells me he subsequently saw Dr. Osborne Andrews and apparently never discontinued all and was taking both metoprolol in addition to carvedilol.  At that time the metoprolol was discontinued.  He underwent a follow-up echo Doppler study on December 03, 2020 which showed normal LV function with EF 60 to 65%.  There was moderate concentric LVH, grade 2 diastolic dysfunction, moderate left atrial dilatation.  He had stable aortic valve replacement with a mean gradient of 10.9 and peak gradient of 20.5 mmHg.  I last saw him on February 22, 2021 presently,  he feels well and denies chest pain or shortness of breath.  He believes he is starting to slow down.  He used to walk a mile and 12 and half minutes and now takes him approximately 15 minutes to walk a mile and he does feel tired.  He denies any chest tightness or dyspnea.  He continues to exercise at least 30 minutes a day.  He recently had laboratory with Dr. Osborne Andrews.  Cholesterol was 141, triglycerides 92, HDL 51, and LDL 72.  His only, he feels well.  He is walking at least 30 minutes a day at a rate of approximately 4  mph, slightly reduced from his previous 5 mph.  His chest pain or change in exercise tolerance.  He recently had an episode of significant constipation which improved with Metamucil.  He also has started to notice a minimal essential tremor of his right  thumb.  He denies chest pain presyncope or syncope.  He denies palpitations.  He presents for evaluation.   Past Medical History:  Diagnosis Date   Bursitis of elbow 05/20/2013   Essential hypertension 03/12/2013   GERD (gastroesophageal reflux disease)    Hyperlipidemia    Hypertension    Left ventricular diastolic dysfunction, NYHA class 2 03/12/2013   LVH (left ventricular hypertrophy)    with aortic stenosis-bicuspid   PONV (postoperative nausea and vomiting)    as a child   S/P AVR (aortic valve replacement) 05/13/2013   S/P AVR, 05/13/13, 25 mm Edwards Magna-Andrews pericardial valve 05/13/2013   Seasonal allergies    Severe aortic stenosis 03/12/2013    Past Surgical History:  Procedure Laterality Date   AORTIC VALVE REPLACEMENT N/A 05/13/2013   Procedure: AORTIC VALVE REPLACEMENT (AVR);  Surgeon: [name], MD;  Location: [facility];  Service: Open Heart Surgery;  Laterality: N/A;   APPENDECTOMY  age 52   CARDIAC CATHETERIZATION     COLONOSCOPY     X 2   HIATAL HERNIA REPAIR     INTRAOPERATIVE TRANSESOPHAGEAL ECHOCARDIOGRAM N/A 05/13/2013   Procedure: INTRAOPERATIVE TRANSESOPHAGEAL ECHOCARDIOGRAM;  Surgeon: [name], MD;  Location: [facility];  Service: Open Heart Surgery;  Laterality: N/A;   LEFT HEART CATHETERIZATION WITH CORONARY ANGIOGRAM N/A 03/15/2013   Procedure: LEFT HEART CATHETERIZATION WITH CORONARY ANGIOGRAM;  Surgeon: [name], MD;  Location: [facility];  Service: Cardiovascular;  Laterality: N/A;   TONSILLECTOMY     TRANSTHORACIC ECHOCARDIOGRAM  03/04/2013   EF 00-34%, grade 2 diastolic dysfunction, AV with mod calcified leaflets & mild regurg, calcified MV annulus, LA mod dilated, RV mildly dilated     Current Medications: Outpatient Medications Prior to Visit  Medication Sig Dispense Refill   amoxicillin (AMOXIL) 500 MG tablet Take 4 tablets 1 hour prior to dental procedure 8 tablet 1   aspirin 81 MG tablet Take 81 mg by mouth daily.     COVID-19 mRNA bivalent vaccine, Pfizer, (PFIZER COVID-19 VAC BIVALENT) injection Inject into the muscle. 0.3 mL 0   COVID-19 mRNA Vac-TriS, Pfizer, (PFIZER-BIONT COVID-19 VAC-TRIS) SUSP injection Inject into the muscle. 0.3 mL 0   diphenhydrAMINE (BENADRYL) 25 MG tablet Take 25 mg by mouth every 6 (six) hours as needed.     ezetimibe-simvastatin (VYTORIN) 10-40 MG tablet Take 1 tablet by mouth at bedtime. 90 tablet 3   Ibuprofen-Diphenhydramine Cit (ADVIL PM PO) Take 2-3 tablets by mouth at bedtime as needed (for sleep).     omeprazole (PRILOSEC OTC) 20 MG tablet Take 20 mg by mouth daily.     amLODipine (NORVASC) 5 MG tablet Take 1 tablet (5 mg total) by mouth daily. 90 tablet 3   carvedilol (COREG) 6.25 MG tablet Take 1 tablet (6.25 mg total) by mouth 2 (two) times daily. 180 tablet 3   lisinopril (ZESTRIL) 40 MG tablet Take 1 tablet (40 mg total) by mouth daily. 90 tablet 3   No facility-administered medications prior to visit.     Allergies:   Scopolamine   Social History   Socioeconomic History   Marital status: Married    Spouse name: Not on file   Number of children: 1   Years of education: college   Highest education level: Not on file  Occupational History   Not on file  Tobacco Use   Smoking status: Former    Packs/day: 1.00    Types: Cigarettes    Quit date: 02/10/1974  Years since quitting: 47.7   Smokeless tobacco: Never  Substance and Sexual Activity   Alcohol use: Yes    Comment: Drinks 1 bottle of wine daily and 1 beer   Drug use: No   Sexual activity: Not on file  Other Topics Concern   Not on file  Social History Narrative   Not on file   Social Determinants of Health   Financial Resource Strain: Not on  file  Food Insecurity: Not on file  Transportation Needs: Not on file  Physical Activity: Not on file  Stress: Not on file  Social Connections: Not on file      Additional social history is notable in that he has a Paediatric nurse in IT sales professional. He is retired from [employer]. He quit tobacco in 1979. He continues to exercise 7 days per week.  Family History:  The patient's family history includes Acute myelogenous leukemia in his brother; Diabetes in his mother; Heart attack in his father and maternal grandfather; Hyperlipidemia in his mother; Hypertension in his mother; Pneumonia in his maternal grandmother.   ROS General: Negative; No fevers, chills, or night sweats;  HEENT: Negative; No changes in vision or hearing, sinus congestion, difficulty swallowing Pulmonary: Negative; No cough, wheezing, shortness of breath, hemoptysis Cardiovascular: Negative; No chest pain, presyncope, syncope, palpitations GI: Negative; No nausea, vomiting, diarrhea, or abdominal pain GU: Negative; No dysuria, hematuria, or difficulty voiding Musculoskeletal: Negative; no myalgias, joint pain, or weakness Hematologic/Oncology: Negative; no easy bruising, bleeding Endocrine: Negative; no heat/cold intolerance; no diabetes Neuro: Negative; no changes in balance, headaches Skin: Negative; No rashes or skin lesions Psychiatric: Negative; No behavioral problems, depression Sleep: Negative; No snoring, daytime sleepiness, hypersomnolence, bruxism, restless legs, hypnogognic hallucinations, no cataplexy Other comprehensive 14 point system review is negative.   PHYSICAL EXAM:   VS:  BP 104/68   Pulse 66   Ht 5' 9"  (1.753 m)   Wt 184 lb 9.6 oz (83.7 kg)   SpO2 98%   BMI 27.26 kg/m     Repeat blood pressure by me was 120/80  Wt Readings from Last 3 Encounters:  10/27/21 184 lb 9.6 oz (83.7 kg)  02/22/21 187 lb 12.8 oz (85.2 kg)  06/22/20 195 lb (88.5 kg)    General: Alert,  oriented, no distress.  Skin: normal turgor, no rashes, warm and dry HEENT: Normocephalic, atraumatic. Pupils equal round and reactive to light; sclera anicteric; extraocular muscles intact;  Nose without nasal septal hypertrophy Mouth/Parynx benign; Mallinpatti scale 3 Neck: No JVD, no carotid bruits; normal carotid upstroke Lungs: clear to ausculatation and percussion; no wheezing or rales Chest wall: without tenderness to palpitation Heart: PMI not displaced, RRR, s1 s2 normal, 7-2/0 systolic murmur, no diastolic murmur, no rubs, gallops, thrills, or heaves Abdomen: soft, nontender; no hepatosplenomehaly, BS+; abdominal aorta nontender and not dilated by palpation. Back: no CVA tenderness Pulses 2+ Musculoskeletal: full range of motion, normal strength, no joint deformities Extremities: no clubbing cyanosis or edema, Homan's sign negative  Neurologic: grossly nonfocal; Cranial nerves grossly wnl Psychologic: Normal mood and affect    Studies/Labs Reviewed:   October 27, 2021 ECG (independently read by me): Sinus rhythm with 1st degree AV block; PR 298 msec  February 22, 2021 ECG (independently read by me): Sinus rhythm at 64, 1 st degree AV block, PR 276 msec  June 08, 2020 ECG (independently read by me): Sinus Bradycardia at 50; 1st degree AV block PR 272 msec; QTc 417 msec  July 2022 ECG (  independently read by me): Sinus bradycardia at 54 bpm with first-degree AV block; PR interval 282 ms.  LVH by voltage criteria in aVL.  December 2020 ECG (independently read by me): Sinus bradycardia at 58 bpm.  First-degree AV block with a parable of 256 ms.  LVH by voltage.  QTc interval 402 ms.  December 2019 ECG (independently read by me): Sinus bradycardia 59 bpm with first-degree AV block.  PR interval 278 ms.  LVH by voltage criteria.   March 2019 ECG (independently read by me): Sinus rhythm at 63 bpm.  First-degree AV block.  PAC.  Borderline voltage criteria for LVH.   October 2018  ECG (independently read by me): Sinus bradycardia 58 bpm.  First-degree AV block.  LVH by voltage.   April 2018 ECG (independently read by me): Sinus bradycardia at 58 bpm.  First degree AV block Borderline LVH by voltage criteria.   July 2017 ECG (independently read by me): Sinus bradycardia at 53 bpm.  Mild first-degree AV block with a PR interval at 226 ms.  Borderline LVH by voltage criteria.  Mild T wave abnormality in 3 and aVF.   November 2016 ECG (independently read by me): Normal sinus rhythm at 65 bpm.  First-degree AV block with a PR interval at 244 ms.   October 2015 ECG disease independently read by me): Normal sinus rhythm at 57 beats per minute.  Mild first degree AV block.   Prior 07/08/2013 EKG (independently read by the) normal sinus rhythm. Non-specific ST-T changes inferolaterally.   Prior 03/12/13 ECG: Sinus rhythm at 66 beats per minute. First-degree AV block. LVH by voltage criteria. Mild nondiagnostic T changes.  Recent Labs:    Latest Ref Rng & Units 07/08/2020   12:14 PM 06/22/2020    3:40 PM 03/20/2017   11:02 AM  BMP  Glucose 65 - 99 mg/dL 98  82  97   BUN 8 - 27 mg/dL 20  19  23    Creatinine 0.76 - 1.27 mg/dL 1.36  1.12  1.22   BUN/Creat Ratio 10 - 24 15  17  19    Sodium 134 - 144 mmol/L 139  139  142   Potassium 3.5 - 5.2 mmol/L 4.4  CANCELED  4.8   Chloride 96 - 106 mmol/L 103  104  105   CO2 20 - 29 mmol/L 19  16  20    Calcium 8.6 - 10.2 mg/dL 9.2  9.3  9.4         Latest Ref Rng & Units 07/08/2013    8:56 AM 05/09/2013    1:13 PM 03/12/2013    9:31 AM  Hepatic Function  Total Protein 6.0 - 8.3 g/dL 6.5  7.4  7.0   Albumin 3.5 - 5.2 g/dL 4.1  4.2  4.6   AST 0 - 37 U/L 16  24  17    ALT 0 - 53 U/L 21  33  18   Alk Phosphatase 39 - 117 U/L 44  43  39   Total Bilirubin 0.2 - 1.2 mg/dL 0.4  0.5  0.7        Latest Ref Rng & Units 08/19/2013    9:20 AM 07/08/2013    8:56 AM 05/17/2013    4:01 AM  CBC  WBC 4.0 - 10.5 K/uL 6.8  6.8  6.7   Hemoglobin  13.0 - 17.0 g/dL 13.4  12.3  9.4   Hematocrit 39.0 - 52.0 % 39.3  36.7  27.3   Platelets 150 -  400 K/uL 170  166  84    Lab Results  Component Value Date   MCV 92.0 08/19/2013   MCV 95.1 07/08/2013   MCV 96.8 05/17/2013   Lab Results  Component Value Date   TSH 1.554 07/08/2013   Lab Results  Component Value Date   HGBA1C 5.5 05/09/2013     BNP No results found for: "BNP"  ProBNP No results found for: "PROBNP"   Lipid Panel     Component Value Date/Time   CHOL 122 07/08/2013 0856   TRIG 89 07/08/2013 0856   HDL 42 07/08/2013 0856   CHOLHDL 2.9 07/08/2013 0856   VLDL 18 07/08/2013 0856   LDLCALC 62 07/08/2013 0856     RADIOLOGY: No results found.   Additional studies/ records that were reviewed today include:   ECHO SUMMARY:03/14/2019 Left Ventricle: Normal LV size and function with EF 60-65%. There is mild hypertrophy of the basal septum. Diastolic dysfunction is inderterminant. There is increased LV filling pressures. Right Ventricle: Normal RV size and RVF. Right Atrium: Normal size. Left Atrium: Severely dilated. Mitral Valve: Moderate thickening and calcification of the anterior MV leaflet with moderate mitral annular calcification and trivial MR. Aortic Valve: S/P bioprosthetic AVR that appears to be functioning normally. Aortic valve mean gradient measures 10.0 mmHg. Aortic valve peak gradient measures 18.7 mmHg. Aortic valve area, by VTI measures 1.32 cm. There is no perivalvular AI. Pulmonic Valve: There is mild pulmonic regurgitation. Tricuspid Valve: There is trivial tricuspid regurgitation. Aorta: Mildly dilated ascending aorta at 102m.   FINDINGS:   Left Ventricle: Left ventricular ejection fraction, by visual estimation, is 60 to 65%. The left ventricle has normal function. There is mildly increased left ventricular hypertrophy of the basal septum. Left ventricular diastolic parameters are  indeterminate. Elevated left ventricular end-diastolic  pressure.   Right Ventricle: The right ventricular size is normal. No increase in right ventricular wall thickness. Global RV systolic function is has normal systolic function. The tricuspid regurgitant velocity is 2.07 m/s, and with an assumed right atrial pressure  of 3 mmHg, the estimated right ventricular systolic pressure is normal at 20.2 mmHg.   Left Atrium: Left atrial size was severely dilated.   Right Atrium: Right atrial size was normal in size   Pericardium: There is no evidence of pericardial effusion.   Mitral Valve: The mitral valve is normal in structure. There is moderate thickening of the anterior mitral valve leaflet(s). There is moderate calcification of the anterior mitral valve leaflet(s). Moderate mitral annular calcification. No evidence of  mitral valve stenosis by observation. MV peak gradient, 6.7 mmHg. Trace mitral valve regurgitation.   Tricuspid Valve: The tricuspid valve is normal in structure. Tricuspid valve regurgitation is mild.   Aortic Valve: The aortic valve is normal in structure. Aortic valve regurgitation is not visualized. The aortic valve is structurally normal, with no evidence of sclerosis or stenosis. Aortic valve mean gradient measures 10.0 mmHg. Aortic valve peak  gradient measures 18.7 mmHg. Aortic valve area, by VTI measures 1.32 cm.   Pulmonic Valve: The pulmonic valve was normal in structure. Pulmonic valve regurgitation is mild.   Aorta: Aortic dilatation noted. There is mild dilatation of the ascending aorta measuring 39 mm.   Venous: The inferior vena cava is normal in size with greater than 50% respiratory variability, suggesting right atrial pressure of 3 mmHg.   IAS/Shunts: No atrial level shunt detected by color flow Doppler. No ventricular septal defect is seen or detected. There is no  evidence of an atrial septal defect.       LEFT VENTRICLE PLAX 2D LVIDd:         4.12 cm  Diastology LVIDs:         3.08 cm  LV e' lateral:    11.20 cm/s LV PW:         0.97 cm  LV E/e' lateral: 9.1 LV IVS:        1.25 cm  LV e' medial:    5.98 cm/s LVOT diam:     2.00 cm  LV E/e' medial:  17.1 LV SV:         38 ml LV SV Index:   18.76 LVOT Area:     3.14 cm     RIGHT VENTRICLE RV Basal diam:  4.88 cm RV S prime:     12.50 cm/s TAPSE (M-mode): 2.5 cm RVSP:           20.2 mmHg   LEFT ATRIUM              Index       RIGHT ATRIUM           Index LA diam:        5.00 cm  2.52 cm/m  RA Pressure: 3.00 mmHg LA Vol (A2C):   95.6 ml  48.15 ml/m RA Area:     19.05 cm LA Vol (A4C):   101.0 ml 50.87 ml/m RA Volume:   50.85 ml  25.61 ml/m LA Biplane Vol: 101.0 ml 50.87 ml/m  AORTIC VALVE AV Area (Vmax):    1.22 cm AV Area (Vmean):   1.30 cm AV Area (VTI):     1.32 cm AV Vmax:           216.00 cm/s AV Vmean:          143.000 cm/s AV VTI:            0.516 m AV Peak Grad:      18.7 mmHg AV Mean Grad:      10.0 mmHg LVOT Vmax:         84.20 cm/s LVOT Vmean:        59.000 cm/s LVOT VTI:          0.217 m LVOT/AV VTI ratio: 0.42   AORTA Ao Root diam: 3.50 cm   MITRAL VALVE                         TRICUSPID VALVE MV Area (PHT): 2.80 cm              TR Peak grad:   17.2 mmHg MV Peak grad:  6.7 mmHg              TR Vmax:        263.00 cm/s MV Mean grad:  2.0 mmHg              Estimated RAP:  3.00 mmHg MV Vmax:       1.29 m/s              RVSP:           20.2 mmHg MV Vmean:      67.4 cm/s MV VTI:        0.42 m                SHUNTS MV PHT:        78.59 msec  Systemic VTI:  0.22 m MV Decel Time: 271 msec              Systemic Diam: 2.00 cm MV E velocity: 102.00 cm/s 103 cm/s MV A velocity: 112.00 cm/s 70.3 cm/s MV E/A ratio:  0.91        1.5    LE Vascular US: 01/13/2020  Summary:  Right: Resting right ankle-brachial index indicates noncompressible right  lower extremity arteries. The right toe-brachial index is normal.   Left: Resting left ankle-brachial index indicates noncompressible left  lower  extremity arteries. The left toe-brachial index is normal.    ECHO: 12/03/2020 IMPRESSIONS   1. Left ventricular ejection fraction, by estimation, is 60 to 65%. The  left ventricle has normal function. The left ventricle has no regional  wall motion abnormalities. There is moderate concentric left ventricular  hypertrophy. Left ventricular  diastolic parameters are consistent with Grade II diastolic dysfunction  (pseudonormalization). Elevated left atrial pressure.   2. Right ventricular systolic function is normal. The right ventricular  size is normal.   3. Left atrial size was moderately dilated.   4. The mitral valve is normal in structure. Trivial mitral valve  regurgitation. No evidence of mitral stenosis.   5. The aortic valve has been repaired/replaced. Aortic valve  regurgitation is not visualized. No aortic stenosis is present. There is a  bioprosthetic valve present in the aortic position. Echo findings are  consistent with normal structure and function  of the aortic valve prosthesis. Aortic valve mean gradient measures 10.9  mmHg. Aortic valve Vmax measures 2.27 m/s. Aortic valve acceleration time  measures 92 msec.   6. There is borderline dilatation of the ascending aorta, measuring 37  mm.   7. The inferior vena cava is normal in size with greater than 50%  respiratory variability, suggesting right atrial pressure of 3 mmHg.   Comparison(s): No significant change from prior study.   ASSESSMENT:    1. Severe aortic stenosis: s/p AVR May 13, 2013 with a 25 mm [employer] magna Andrews bovine pericardial valve   2. Primary hypertension   3. First degree AV block   4. Pure hypercholesterolemia   5. GERD without esophagitis     PLAN:  Mr. Veverly Fells is a 76 year old gentleman who developed severe aortic stenosis and underwent successful AVR with a 25 mm [facility] Andrews bovine pericardial tissue valve on May 13, 2013.  He has a history of hypertension and  hyperlipidemia.  On his echo Doppler study of March 14, 2019 his bioprosthetic valve remained stable with normal gradients and without evidence for perivalvular leak.  EF was 60 to 65% with mild left ventricular hypertrophy of the basal septum.  He had moderate mitral annular calcification with trivial MR, mild PR and his ascending aorta measured 39 mm.  His most recent follow-up echo on December 03, 2020 remained stable and showed normal LV function with EF 60 to 65%.  There was moderate concentric LVH with grade 2 diastolic dysfunction and moderate left atrial dilatation.  His aortic valve gradients were stable with a mean gradient of 10.9 and peak gradient of 20.5.  Over the past year he has remained stable and continues to be on a regimen of amlodipine 5 mg, carvedilol 6.25 mg twice a day, and lisinopril 40 mg daily for blood pressure control.  He is on Zetia/simvastatin 10/40 for hyperlipidemia.  He continues to be followed by Dr. Osborne Andrews.  He is on Prilosec for GERD.  He recently had  significant constipation which improved with Metamucil.  Clinically he is stable and continues to walk on a daily basis.  In February/March 2024 I have recommended he undergo an 78-monthfollow-up evaluation and I will see him in the office in follow-up of that study.  He will return to the primary care of Dr. TOsborne Andrews  Medication Adjustments/Labs and Tests Ordered: Current medicines are reviewed at length with the patient today.  Concerns regarding medicines are outlined above.  Medication changes, Labs and Tests ordered today are listed in the Patient Instructions below. Patient Instructions  Medication Instructions:  Your Physician recommend you continue on your current medication as directed.    *If you need a refill on your cardiac medications before your next appointment, please call your pharmacy*   Lab Work: None ordered today   Testing/Procedures: Your physician has requested that you have an  echocardiogram in February, 2024. Echocardiography is a painless test that uses sound waves to create images of your heart. It provides your doctor with information about the size and shape of your heart and how well your heart's chambers and valves are working. This procedure takes approximately one hour. There are no restrictions for this procedure. [id number]    Follow-Up: At [facility] you and your health needs are our priority.  As part of our continuing mission to provide you with exceptional heart care, we have created designated Provider Care Teams.  These Care Teams include your primary Cardiologist (physician) and Advanced Practice Providers (APPs -  Physician Assistants and Nurse Practitioners) who all work together to provide you with the care you need, when you need it.  We recommend signing up for the patient portal called "MyChart".  Sign up information is provided on this After Visit Summary.  MyChart is used to connect with patients for Virtual Visits (Telemedicine).  Patients are able to view lab/test results, encounter notes, upcoming appointments, etc.  Non-urgent messages can be sent to your provider as well.   To learn more about what you can do with MyChart, go to [email or web address]    Your next appointment:   9 month(s)  The format for your next appointment:   In Person  Provider:   TShelva Majestic MD {          Signed, TShelva Majestic MD  10/27/2021 6:20 PM    [id number] [place]  [id number]: [phone]

## 2021-10-27 NOTE — Patient Instructions (Signed)
Medication Instructions:  Your Physician recommend you continue on your current medication as directed.    *If you need a refill on your cardiac medications before your next appointment, please call your pharmacy*   Lab Work: None ordered today   Testing/Procedures: Your physician has requested that you have an echocardiogram in February, 2024. Echocardiography is a painless test that uses sound waves to create images of your heart. It provides your doctor with information about the size and shape of your heart and how well your heart's chambers and valves are working. This procedure takes approximately one hour. There are no restrictions for this procedure. [address]    Follow-Up: At [facility], you and your health needs are our priority.  As part of our continuing mission to provide you with exceptional heart care, we have created designated Provider Care Teams.  These Care Teams include your primary Cardiologist (physician) and Advanced Practice Providers (APPs -  Physician Assistants and Nurse Practitioners) who all work together to provide you with the care you need, when you need it.  We recommend signing up for the patient portal called "MyChart".  Sign up information is provided on this After Visit Summary.  MyChart is used to connect with patients for Virtual Visits (Telemedicine).  Patients are able to view lab/test results, encounter notes, upcoming appointments, etc.  Non-urgent messages can be sent to your provider as well.   To learn more about what you can do with MyChart, go to [email or web address].    Your next appointment:   9 month(s)  The format for your next appointment:   In Person  Provider:   Nicki Guadalajara, MD {

## 2022-01-19 DIAGNOSIS — R7301 Impaired fasting glucose: Secondary | ICD-10-CM | POA: Diagnosis not present

## 2022-01-19 DIAGNOSIS — R7989 Other specified abnormal findings of blood chemistry: Secondary | ICD-10-CM | POA: Diagnosis not present

## 2022-01-19 DIAGNOSIS — Z125 Encounter for screening for malignant neoplasm of prostate: Secondary | ICD-10-CM | POA: Diagnosis not present

## 2022-01-19 DIAGNOSIS — E7800 Pure hypercholesterolemia, unspecified: Secondary | ICD-10-CM | POA: Diagnosis not present

## 2022-01-26 DIAGNOSIS — Z0000 Encounter for general adult medical examination without abnormal findings: Secondary | ICD-10-CM | POA: Diagnosis not present

## 2022-01-26 DIAGNOSIS — I517 Cardiomegaly: Secondary | ICD-10-CM | POA: Diagnosis not present

## 2022-01-26 DIAGNOSIS — R82998 Other abnormal findings in urine: Secondary | ICD-10-CM | POA: Diagnosis not present

## 2022-01-26 DIAGNOSIS — I350 Nonrheumatic aortic (valve) stenosis: Secondary | ICD-10-CM | POA: Diagnosis not present

## 2022-01-26 DIAGNOSIS — R7301 Impaired fasting glucose: Secondary | ICD-10-CM | POA: Diagnosis not present

## 2022-01-26 DIAGNOSIS — Z1331 Encounter for screening for depression: Secondary | ICD-10-CM | POA: Diagnosis not present

## 2022-01-26 DIAGNOSIS — N1831 Chronic kidney disease, stage 3a: Secondary | ICD-10-CM | POA: Diagnosis not present

## 2022-01-26 DIAGNOSIS — I5032 Chronic diastolic (congestive) heart failure: Secondary | ICD-10-CM | POA: Diagnosis not present

## 2022-01-26 DIAGNOSIS — I130 Hypertensive heart and chronic kidney disease with heart failure and stage 1 through stage 4 chronic kidney disease, or unspecified chronic kidney disease: Secondary | ICD-10-CM | POA: Diagnosis not present

## 2022-01-26 DIAGNOSIS — E7800 Pure hypercholesterolemia, unspecified: Secondary | ICD-10-CM | POA: Diagnosis not present

## 2022-01-26 DIAGNOSIS — Z1339 Encounter for screening examination for other mental health and behavioral disorders: Secondary | ICD-10-CM | POA: Diagnosis not present

## 2022-01-26 DIAGNOSIS — Z952 Presence of prosthetic heart valve: Secondary | ICD-10-CM | POA: Diagnosis not present

## 2022-02-23 ENCOUNTER — Other Ambulatory Visit (HOSPITAL_BASED_OUTPATIENT_CLINIC_OR_DEPARTMENT_OTHER): Payer: Self-pay

## 2022-02-23 MED ORDER — COMIRNATY 30 MCG/0.3ML IM SUSY
PREFILLED_SYRINGE | INTRAMUSCULAR | 0 refills | Status: DC
  Filled 2022-02-23: qty 0.3, 1d supply, fill #0

## 2022-03-01 DIAGNOSIS — Z1212 Encounter for screening for malignant neoplasm of rectum: Secondary | ICD-10-CM | POA: Diagnosis not present

## 2022-03-07 DIAGNOSIS — Z1211 Encounter for screening for malignant neoplasm of colon: Secondary | ICD-10-CM | POA: Diagnosis not present

## 2022-03-07 DIAGNOSIS — Z1212 Encounter for screening for malignant neoplasm of rectum: Secondary | ICD-10-CM | POA: Diagnosis not present

## 2022-05-02 DIAGNOSIS — J189 Pneumonia, unspecified organism: Secondary | ICD-10-CM

## 2022-05-02 HISTORY — DX: Pneumonia, unspecified organism: J18.9

## 2022-05-17 DIAGNOSIS — H5211 Myopia, right eye: Secondary | ICD-10-CM | POA: Diagnosis not present

## 2022-05-17 DIAGNOSIS — H52203 Unspecified astigmatism, bilateral: Secondary | ICD-10-CM | POA: Diagnosis not present

## 2022-05-17 DIAGNOSIS — H524 Presbyopia: Secondary | ICD-10-CM | POA: Diagnosis not present

## 2022-05-17 DIAGNOSIS — Z961 Presence of intraocular lens: Secondary | ICD-10-CM | POA: Diagnosis not present

## 2022-06-08 ENCOUNTER — Ambulatory Visit: Payer: Medicare HMO | Admitting: Cardiovascular Disease

## 2022-06-27 ENCOUNTER — Ambulatory Visit (HOSPITAL_COMMUNITY): Payer: Medicare HMO | Attending: Cardiovascular Disease

## 2022-06-27 DIAGNOSIS — I350 Nonrheumatic aortic (valve) stenosis: Secondary | ICD-10-CM | POA: Insufficient documentation

## 2022-06-27 LAB — ECHOCARDIOGRAM COMPLETE
AR max vel: 1.13 cm2
AV Area VTI: 1.36 cm2
AV Area mean vel: 1.31 cm2
AV Mean grad: 7 mmHg
AV Peak grad: 15.1 mmHg
Ao pk vel: 1.94 m/s
Area-P 1/2: 3.34 cm2
S' Lateral: 3 cm

## 2022-07-13 ENCOUNTER — Ambulatory Visit: Payer: Medicare HMO | Attending: Cardiovascular Disease | Admitting: Cardiovascular Disease

## 2022-07-13 ENCOUNTER — Encounter: Payer: Self-pay | Admitting: Cardiovascular Disease

## 2022-07-13 VITALS — BP 96/64 | HR 63 | Ht 69.0 in | Wt 181.0 lb

## 2022-07-13 DIAGNOSIS — I440 Atrioventricular block, first degree: Secondary | ICD-10-CM

## 2022-07-13 DIAGNOSIS — K219 Gastro-esophageal reflux disease without esophagitis: Secondary | ICD-10-CM

## 2022-07-13 DIAGNOSIS — E7800 Pure hypercholesterolemia, unspecified: Secondary | ICD-10-CM

## 2022-07-13 DIAGNOSIS — R251 Tremor, unspecified: Secondary | ICD-10-CM | POA: Diagnosis not present

## 2022-07-13 DIAGNOSIS — I350 Nonrheumatic aortic (valve) stenosis: Secondary | ICD-10-CM

## 2022-07-13 DIAGNOSIS — I10 Essential (primary) hypertension: Secondary | ICD-10-CM | POA: Diagnosis not present

## 2022-07-13 MED ORDER — CARVEDILOL 6.25 MG PO TABS: 6.2500 mg | ORAL_TABLET | Freq: Two times a day (BID) | ORAL | 3 refills | Status: DC

## 2022-07-13 MED ORDER — LISINOPRIL 40 MG PO TABS: 40.0000 mg | ORAL_TABLET | Freq: Every day | ORAL | 3 refills | Status: DC

## 2022-07-13 MED ORDER — AMLODIPINE BESYLATE 5 MG PO TABS: 5.0000 mg | ORAL_TABLET | Freq: Every day | ORAL | 3 refills | Status: DC

## 2022-07-13 NOTE — Patient Instructions (Signed)
Medication Instructions:  Your physician recommends that you continue on your current medications as directed. Please refer to the Current Medication list given to you today.  *If you need a refill on your cardiac medications before your next appointment, please call your pharmacy*  Follow-Up: At [facility], you and your health needs are our priority.  As part of our continuing mission to provide you with exceptional heart care, we have created designated Provider Care Teams.  These Care Teams include your primary Cardiologist (physician) and Advanced Practice Providers (APPs -  Physician Assistants and Nurse Practitioners) who all work together to provide you with the care you need, when you need it.  We recommend signing up for the patient portal called "MyChart".  Sign up information is provided on this After Visit Summary.  MyChart is used to connect with patients for Virtual Visits (Telemedicine).  Patients are able to view lab/test results, encounter notes, upcoming appointments, etc.  Non-urgent messages can be sent to your provider as well.   To learn more about what you can do with MyChart, go to [email or web address].    Your next appointment:   November/December with Dr. Claiborne Billings

## 2022-07-13 NOTE — Progress Notes (Signed)
Cardiology Office Note    Date:  07/18/2022   ID:  Molly Maselli, DOB 19-Mar-1946, MRN [id number]  PCP:  Gaspar Garbe, MD  Cardiologist:  Nicki Guadalajara, MD   9 month follow-up  History of Present Illness:  Avaneesh Pepitone is a 77 y.o. male who was first told of having a heart murmur back in the 1980s when he was planning to work at [employer] and had a physical exam at that time. I have seen him since 2005 when he was referred to me by [name] for cardiac murmur. Since 2005 he had undergone follow-up  echo Doppler studies to assess his aortic valve. In 2005 his aortic transvalvular gradient was 37 with a mean gradient of 18. Over the last 9 years, his aortic valve murmur gradually become more significant and he remained asymptomatic.  An echo in 11/03/2014showed normal LV function with  grade 2 diastolic dysfunction. His aortic valve again was moderately calcified. The mean gradient had increased to 47 and peak instantaneous gradient 86 giving a calculated aortic valve area of 0.63 cm. He did have mitral annular calcification. His left atrium was moderately dilated. He did have very mild RV dilatation.   When I saw him in November 2014 Mr. Campanelli was continuing to exercise but there was new development of exertional shortness of breath. At that time, I strongly recommended cardiac catheterization and he underwent right and left heart cardiac catheterization on 03/15/2013. This confirmed severe aortic valve stenosis mild pulmonary hypertension. He had mild chronic calcification but nonobstructive 20% narrowing in the LAD.    He underwent successful aortic valve replacement surgery by Dr. Evelene Croon 05/13/2013 and had a 25 mm [facility] Ease bovine pericardial valve inserted. He has done remarkably well following his valve replacement surgery. He denies any episodes of palpitations. He has resumed activity.   Since his valve replacement, he has  continued to feel well.  He denies any shortness of breath or chest pain.  He does exercise regularly.  Typically in the cold winter months.  He likes to exercise at cardiac rehabilitation for the months of January and February..  However, he has had several rare occurrences of diplopia it are short lived.  He has been taking aspirin 81 mg daily.  A repeat echo Doppler study on 12/23/2013.revealed an EF 55-60% with mild LVH.  His aortic prosthesis was well-seated and open well.  There was only trivial aortic insufficiency.  He did have mild dilatation of his left atrium and mitral annular calcification.  There also was some dilatation of his RV.     His echo in October 2016 continued to show excellent LV function with an EF of 55-60% and he had normal diastolic parameters.  The bioprosthetic AV was  functioning normally.  The gradients were felt to be very minimally elevated and unchanged from his previous study with a mean gradient of 11 and peak gradient of 23 mm.  There was no evidence for aortic insufficiency.  There was evidence for mitral annular calcification and mild LA dilatation.   He underwent a 2-D echo Doppler study on 08/02/2016.  He had normal LV function with EF of 60-65% and grade 2 diastolic dysfunction.  His aortic bioprosthesis was well-seated and was functioning well.  The mean gradient was 11 and peak gradient 20.  He had mild MR.  There was mild TR and mild LA dilatation.     He participates the maintenance phase  of cardiac rehabilitation over the winter months and will be ending this week.  Typically over the summer he rides his bike.  On days he does not do cardiac rehabilitation in the winter he often walks and if the weather is nice.  He may ride his bike occasionally.  He typically gained some weight over the winter months but loses weight over the summer when he is more active outside.  He denies any chest pain, presyncope or syncope, palpitations, or change in exercise tolerance.   Recently, he has noticed his blood pressure being slightly elevated in the 130s to 140s when he arrives at cardiac rehabilitation after exercise his blood pressure is normal.  He has increased his lisinopril to 7.5 mg.    When I saw him in October 2018, he denied any episodes of chest pain or shortness of breath.  He was exercising and walks for at least 30 minutes or bikes 7 days per week.  Over the winter months, he again participated in the cardiac rehabilitation program.  His blood pressure was mildly elevated and I titrated lisinopril to 15 mg daily.  He continues to take metoprolol succinate 50 mg.  He has been on generic Vytorin 10/40 for hyperlipidemia.    He underwent a follow-up echo Doppler study on January 08, 2018.  This showed normal systolic function with grade 2 diastolic dysfunction.  EF was 55 to 60%.  His aortic bioprosthesis was well-seated.  Mean gradient was 12.  There was no AI.  His a sending aorta was mildly dilated.  There was mild MR and mild to moderate biatrial enlargement.  He continues to be asymptomatic and feels well.  He is enjoying retirement.     Since I saw him in December 2019, he again participated in the cardiac rehabilitation program over the winter months.  He was now back riding his bike at least 30 minutes a day and also on days that he does not ride his bike he typically walks at a fast pace.    I evaluated him in a telemedicine visit on Sep 11, 2018 which time he continued to be stable and denied any chest pain, PND, or orthopnea.  He was taking  lisinopril 20 mg and Toprol-XL 50 mg for hypertension.  His blood pressure has been well controlled and typically when he leaves cardiac rehab it would be approximately 120/70.  He continues to be on Vytorin 10/40 for hyperlipidemia.  He has a hiatal hernia and GERD which is well controlled with omeprazole.  His laboratory typically is done by Dr. Osborne Casco.  I saw him in December 2020 and since his prior evaluation he  had remained stable.  He was continuing  to ride his bike but in the winter months seems to walk more due to the weather.  Typically walks for 30 minutes for 2.2 miles.  He underwent a follow-up echo Doppler study on March 14, 2019.  EF remains 60 to 65% with mild LVH of the basal septum.  Diastolic function was indeterminate.  Had a stable bioprosthetic aortic valve with normal gradients with a mean gradient of 10 and peak gradient of 18.7.  There was no perivalvular aortic insufficiency.  He had mild pulmonic regurgitation.  His ascending aorta measured 39 mm.  He has continued to be on generic Vytorin 10/40 and lipid studies in August 2020 showed a total cholesterol 129, HDL 43, LDL 72, and triglycerides 70.  He continues to be on Toprol-XL 50 mg and lisinopril  20 mg daily.  GERD is controlled with omeprazole.    I saw him in July 2021 and since his last evaluation he was either walking or biking on a daily basis.   Due to the Covid pandemic he did not do cardiac rehab over the winter.  He had recently had an insurance person come to the house.  Apparently they had done ABIs and noticed his right ABI was reduced at 0.85 and left ABI was 1.24.  They were concerned about possible peripheral vascular disease.  He sees Dr. Wylene Simmer who has checked laboratory.  He continues to be on generic Vytorin 10/40, lisinopril 20 mg daily, metoprolol succinate 50 mg daily and takes omeprazole over-the-counter daily.  During that evaluation, his blood pressure was elevated and I recommended slight titration of lisinopril from 20 mg up to 30 mg daily for more optimal blood pressure control.  Following his evaluation, he underwent lower extremity arterial Doppler study on January 13, 2020.  This revealed noncompressible right ankle brachial index and left ankle brachial index but normal right toe brachial and left toe brachial indices.  When I saw him in June 22, 2020 he continued to feel well and was without chest pain  or shortness of breath.  He denied any palpitations.. He sees Dr. Wylene Simmer who checks his laboratory.  He denies presyncope or syncope.  He continues to try to walk daily has not been biking recently.  He continues to be on Vytorin 10/40 hyperlipidemia and lisinopril 30 mg with metoprolol succinate 50 mg daily for hypertension.  GERD is treated with over-the-counter Prilosec.  I reviewed his lower extremity vascular ultrasound which revealed normal to brachial indices.   He was evaluated by Marjie Skiff and apparently was having some issues with blood pressure control.  During her evaluation, she recommended he discontinue metoprolol and in its place initiate carvedilol 6.25 mg twice a day.  His lisinopril was increased to 40 mg.  He subsequently kept blood pressure log and with blood pressure elevation amlodipine 5 mg was added to his regimen.  He tells me he subsequently saw Dr. Wylene Simmer and apparently never discontinued all and was taking both metoprolol in addition to carvedilol.  At that time the metoprolol was discontinued.  He underwent a follow-up echo Doppler study on December 03, 2020 which showed normal LV function with EF 60 to 65%.  There was moderate concentric LVH, grade 2 diastolic dysfunction, moderate left atrial dilatation.  He had stable aortic valve replacement with a mean gradient of 10.9 and peak gradient of 20.5 mmHg.  I saw him on February 22, 2021.  He felt well and deniesdchest pain or shortness of breath.  He believes he is starting to slow down.  He used to walk a mile and 12 and half minutes and now takes him approximately 15 minutes to walk a mile and he does feel tired.  He denies any chest tightness or dyspnea.  He continues to exercise at least 30 minutes a day.  He recently had laboratory with Dr. Wylene Simmer.  Cholesterol was 141, triglycerides 92, HDL 51, and LDL 72.  I last saw him on October 27, 2021.  At that time he continued to be active and was walking at least 30 minutes a  day at a rate of approximately 4 mph, slightly reduced from his previous 5 mph.  He denies any chest pain or significant change in exercise tolerance.  He recently had an episode of significant constipation which improved with  Metamucil.  He also has started to notice a minimal essential tremor of his right thumb.  He denied chest pain, presyncope or syncope.  He denied palpitations.    Since I last saw him, he underwent an echo Doppler study on June 27, 2022.  This continued show normal LV function with EF 55 to 60%.  There was grade 2 diastolic dysfunction.  His bioprosthetic aortic valve had normal gradient.  There was no aortic insufficiency.  There was moderate mitral annular calcification and very mild dilation of ascending aorta 37 mm.  Presently, Mr. Antonio its denies any chest pain.  He denies any significant shortness of breath.  However, he has noted some more fatigability.  He is still walking but typically after 20 to 25 minutes of walking he feels like he needs to stop.  Toward the end of his walk he also has difficult time seeming to elevate his legs as much.  He is continues to notice mild tremor of his right hand.  He states his wife believes he is not as quick and his thinking is somewhat slower than previously.  He denies any chest tightness, palpitations presyncope or syncope.   Past Medical History:  Diagnosis Date   Bursitis of elbow 05/20/2013   Essential hypertension 03/12/2013   GERD (gastroesophageal reflux disease)    Hyperlipidemia    Hypertension    Left ventricular diastolic dysfunction, NYHA class 2 03/12/2013   LVH (left ventricular hypertrophy)    with aortic stenosis-bicuspid   PONV (postoperative nausea and vomiting)    as a child   S/P AVR (aortic valve replacement) 05/13/2013   S/P AVR, 05/13/13, 25 mm Edwards Magna-Ease pericardial valve 05/13/2013   Seasonal allergies    Severe aortic stenosis 03/12/2013    Past Surgical History:  Procedure Laterality  Date   AORTIC VALVE REPLACEMENT N/A 05/13/2013   Procedure: AORTIC VALVE REPLACEMENT (AVR);  Surgeon: Alleen Borne, MD;  Location: [facility] OR;  Service: Open Heart Surgery;  Laterality: N/A;   APPENDECTOMY  age 67   CARDIAC CATHETERIZATION     COLONOSCOPY     X 2   HIATAL HERNIA REPAIR     INTRAOPERATIVE TRANSESOPHAGEAL ECHOCARDIOGRAM N/A 05/13/2013   Procedure: INTRAOPERATIVE TRANSESOPHAGEAL ECHOCARDIOGRAM;  Surgeon: Alleen Borne, MD;  Location: [facility] OR;  Service: Open Heart Surgery;  Laterality: N/A;   LEFT HEART CATHETERIZATION WITH CORONARY ANGIOGRAM N/A 03/15/2013   Procedure: LEFT HEART CATHETERIZATION WITH CORONARY ANGIOGRAM;  Surgeon: Lennette Bihari, MD;  Location: [facility] C[facility] LAB;  Service: Cardiovascular;  Laterality: N/A;   TONSILLECTOMY     TRANSTHORACIC ECHOCARDIOGRAM  03/04/2013   EF 55-60%, grade 2 diastolic dysfunction, AV with mod calcified leaflets & mild regurg, calcified MV annulus, LA mod dilated, RV mildly dilated    Current Medications: Outpatient Medications Prior to Visit  Medication Sig Dispense Refill   amoxicillin (AMOXIL) 500 MG tablet Take 4 tablets 1 hour prior to dental procedure 8 tablet 1   aspirin 81 MG tablet Take 81 mg by mouth daily.     COVID-19 mRNA bivalent vaccine, Pfizer, (PFIZER COVID-19 VAC BIVALENT) injection Inject into the muscle. 0.3 mL 0   COVID-19 mRNA Vac-TriS, Pfizer, (PFIZER-BIONT COVID-19 VAC-TRIS) SUSP injection Inject into the muscle. 0.3 mL 0   COVID-19 mRNA vaccine 2023-2024 (COMIRNATY) syringe Inject into the muscle. 0.3 mL 0   diphenhydrAMINE (BENADRYL) 25 MG tablet Take 25 mg by mouth every 6 (six) hours as needed.     ezetimibe-simvastatin (VYTORIN) 10-40  MG tablet Take 1 tablet by mouth at bedtime. 90 tablet 3   Ibuprofen-Diphenhydramine Cit (ADVIL PM PO) Take 2-3 tablets by mouth at bedtime as needed (for sleep).     omeprazole (PRILOSEC OTC) 20 MG tablet Take 20 mg by mouth daily.     amLODipine (NORVASC) 5 MG tablet Take 1 tablet  (5 mg total) by mouth daily. 90 tablet 3   carvedilol (COREG) 6.25 MG tablet Take 1 tablet (6.25 mg total) by mouth 2 (two) times daily. 180 tablet 3   lisinopril (ZESTRIL) 40 MG tablet Take 1 tablet (40 mg total) by mouth daily. 90 tablet 3   No facility-administered medications prior to visit.     Allergies:   Scopolamine   Social History   Socioeconomic History   Marital status: Married    Spouse name: Not on file   Number of children: 1   Years of education: college   Highest education level: Not on file  Occupational History   Not on file  Tobacco Use   Smoking status: Former    Packs/day: 1    Types: Cigarettes    Quit date: 02/10/1974    Years since quitting: 48.4   Smokeless tobacco: Never  Substance and Sexual Activity   Alcohol use: Yes    Comment: Drinks 1 bottle of wine daily and 1 beer   Drug use: No   Sexual activity: Not on file  Other Topics Concern   Not on file  Social History Narrative   Not on file   Social Determinants of Health   Financial Resource Strain: Not on file  Food Insecurity: Not on file  Transportation Needs: Not on file  Physical Activity: Not on file  Stress: Not on file  Social Connections: Not on file      Additional social history is notable in that 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 He quit tobacco in 1979. He continues to exercise 7 days per week.  Family History:  The patient's family history includes Acute myelogenous leukemia in his brother; Diabetes in his mother; Heart attack in his father and maternal grandfather; Hyperlipidemia in his mother; Hypertension in his mother; Pneumonia in his maternal grandmother.   ROS General: Negative; No fevers, chills, or night sweats;  HEENT: Negative; No changes in vision or hearing, sinus congestion, difficulty swallowing Pulmonary: Negative; No cough, wheezing, shortness of breath, hemoptysis Cardiovascular: Negative; No  chest pain, presyncope, syncope, palpitations GI: Negative; No nausea, vomiting, diarrhea, or abdominal pain GU: Negative; No dysuria, hematuria, or difficulty voiding Musculoskeletal: Negative; no myalgias, joint pain, or weakness Hematologic/Oncology: Negative; no easy bruising, bleeding Endocrine: Negative; no heat/cold intolerance; no diabetes Neuro: Left hand tremor Skin: Negative; No rashes or skin lesions Psychiatric: Negative; No behavioral problems, depression Sleep: Negative; No snoring, daytime sleepiness, hypersomnolence, bruxism, restless legs, hypnogognic hallucinations, no cataplexy Other comprehensive 14 point system review is negative.   PHYSICAL EXAM:   VS:  BP 96/64   Pulse 63   Ht 5\' 9"  (1.753 m)   Wt 181 lb (82.1 kg)   SpO2 97%   BMI 26.73 kg/m     Repeat blood pressure by me was 120/68 supine and 120/70 standing  Wt Readings from Last 3 Encounters:  07/13/22 181 lb (82.1 kg)  10/27/21 184 lb 9.6 oz (83.7 kg)  02/22/21 187 lb 12.8 oz (85.2 kg)    General: Alert, oriented, no distress.  Skin: normal turgor, no rashes,  warm and dry HEENT: Normocephalic, atraumatic. Pupils equal round and reactive to light; sclera anicteric; extraocular muscles intact;  Nose without nasal septal hypertrophy Mouth/Parynx benign; Mallinpatti scale 3 Neck: No JVD, no carotid bruits; normal carotid upstroke Lungs: clear to ausculatation and percussion; no wheezing or rales Chest wall: without tenderness to palpitation Heart: PMI not displaced, RRR, s1 s2 normal, 1/6 systolic murmur, no diastolic murmur, no rubs, gallops, thrills, or heaves Abdomen: soft, nontender; no hepatosplenomehaly, BS+; abdominal aorta nontender and not dilated by palpation. Back: no CVA tenderness Pulses 2+ Musculoskeletal: full range of motion, normal strength, no joint deformities Extremities: no clubbing cyanosis or edema, Homan's sign negative  Neurologic: Left hand tremor.  No definitive cogwheel  rigidity Psychologic: Normal mood and affect      Studies/Labs Reviewed:   July 13, 2022 ECG (independently read by me): Sinus rhythm at 63, 1st degree AV block, PR 270 msec  October 27, 2021 ECG (independently read by me): Sinus rhythm with 1st degree AV block; PR 298 msec  February 22, 2021 ECG (independently read by me): Sinus rhythm at 64, 1 st degree AV block, PR 276 msec  June 08, 2020 ECG (independently read by me): Sinus Bradycardia at 50; 1st degree AV block PR 272 msec; QTc 417 msec  July 2022 ECG (independently read by me): Sinus bradycardia at 54 bpm with first-degree AV block; PR interval 282 ms.  LVH by voltage criteria in aVL.  December 2020 ECG (independently read by me): Sinus bradycardia at 58 bpm.  First-degree AV block with a parable of 256 ms.  LVH by voltage.  QTc interval 402 ms.  December 2019 ECG (independently read by me): Sinus bradycardia 59 bpm with first-degree AV block.  PR interval 278 ms.  LVH by voltage criteria.   March 2019 ECG (independently read by me): Sinus rhythm at 63 bpm.  First-degree AV block.  PAC.  Borderline voltage criteria for LVH.   October 2018 ECG (independently read by me): Sinus bradycardia 58 bpm.  First-degree AV block.  LVH by voltage.   April 2018 ECG (independently read by me): Sinus bradycardia at 58 bpm.  First degree AV block Borderline LVH by voltage criteria.   July 2017 ECG (independently read by me): Sinus bradycardia at 53 bpm.  Mild first-degree AV block with a PR interval at 226 ms.  Borderline LVH by voltage criteria.  Mild T wave abnormality in 3 and aVF.   November 2016 ECG (independently read by me): Normal sinus rhythm at 65 bpm.  First-degree AV block with a PR interval at 244 ms.   October 2015 ECG disease independently read by me): Normal sinus rhythm at 57 beats per minute.  Mild first degree AV block.   Prior 07/08/2013 EKG (independently read by the) normal sinus rhythm. Non-specific ST-T changes  inferolaterally.   Prior 03/12/13 ECG: Sinus rhythm at 66 beats per minute. First-degree AV block. LVH by voltage criteria. Mild nondiagnostic T changes.  Recent Labs:    Latest Ref Rng & Units 07/08/2020   12:14 PM 06/22/2020    3:40 PM 03/20/2017   11:02 AM  BMP  Glucose 65 - 99 mg/dL 98  82  97   BUN 8 - 27 mg/dL 20  19  23    Creatinine 0.76 - 1.27 mg/dL 1.36  1.12  1.22   BUN/Creat Ratio 10 - 24 15  17  19    Sodium 134 - 144 mmol/L 139  139  142   Potassium 3.5 -  5.2 mmol/L 4.4  CANCELED  4.8   Chloride 96 - 106 mmol/L 103  104  105   CO2 20 - 29 mmol/L 19  16  20    Calcium 8.6 - 10.2 mg/dL 9.2  9.3  9.4         Latest Ref Rng & Units 07/08/2013    8:56 AM 05/09/2013    1:13 PM 03/12/2013    9:31 AM  Hepatic Function  Total Protein 6.0 - 8.3 g/dL 6.5  7.4  7.0   Albumin 3.5 - 5.2 g/dL 4.1  4.2  4.6   AST 0 - 37 U/L 16  24  17    ALT 0 - 53 U/L 21  33  18   Alk Phosphatase 39 - 117 U/L 44  43  39   Total Bilirubin 0.2 - 1.2 mg/dL 0.4  0.5  0.7        Latest Ref Rng & Units 08/19/2013    9:20 AM 07/08/2013    8:56 AM 05/17/2013    4:01 AM  CBC  WBC 4.0 - 10.5 K/uL 6.8  6.8  6.7   Hemoglobin 13.0 - 17.0 g/dL 16.113.4  09.612.3  9.4   Hematocrit 39.0 - 52.0 % 39.3  36.7  27.3   Platelets 150 - 400 K/uL 170  166  84    Lab Results  Component Value Date   MCV 92.0 08/19/2013   MCV 95.1 07/08/2013   MCV 96.8 05/17/2013   Lab Results  Component Value Date   TSH 1.554 07/08/2013   Lab Results  Component Value Date   HGBA1C 5.5 05/09/2013     BNP No results found for: "BNP"  ProBNP No results found for: "PROBNP"   Lipid Panel     Component Value Date/Time   CHOL 122 07/08/2013 0856   TRIG 89 07/08/2013 0856   HDL 42 07/08/2013 0856   CHOLHDL 2.9 07/08/2013 0856   VLDL 18 07/08/2013 0856   LDLCALC 62 07/08/2013 0856     RADIOLOGY: ECHOCARDIOGRAM COMPLETE  Result Date: 06/27/2022    ECHOCARDIOGRAM REPORT   Patient Name:   Elisandro EDWIN Busey Date of  Exam: 06/27/2022 Medical Rec #:  [id number]                Height:       69.0 in Accession #:    [id number]               Weight:       184.6 lb Date of Birth:  02/10/1946                 BSA:          1.997 m Patient Age:    77 years                 BP:           108/78 mmHg Patient Gender: M                        HR:           56 bpm. Exam Location:  [address] Procedure: 2D Echo, Cardiac Doppler and Color Doppler Indications:    Z95 s/p AVR  History:        Patient has prior history of Echocardiogram examinations, most                 recent 12/03/2020. Risk Factors:Hypertension and HLD.  Sonographer:    Clearence Ped RCS Referring Phys: [phone] Avani Sensabaugh A Gwendloyn Forsee IMPRESSIONS  1. Left ventricular ejection fraction, by estimation, is 55 to 60%. The left ventricle has normal function. The left ventricle has no regional wall motion abnormalities. There is mild concentric left ventricular hypertrophy. Left ventricular diastolic parameters are consistent with Grade II diastolic dysfunction (pseudonormalization).  2. Right ventricular systolic function is normal. The right ventricular size is mildly enlarged. There is normal pulmonary artery systolic pressure. The estimated right ventricular systolic pressure is 20.6 mmHg.  3. Left atrial size was moderately dilated.  4. The mitral valve is degenerative. Trivial mitral valve regurgitation. No evidence of mitral stenosis. Moderate mitral annular calcification.  5. Bioprosthetic aortic valve replacement. Mean gradient 7 mmHg, no significant stenosis. No significant peri-valvular leakage noted.  6. Aortic dilatation noted. There is mild dilatation of the ascending aorta, measuring 37 mm.  7. The inferior vena cava is normal in size with greater than 50% respiratory variability, suggesting right atrial pressure of 3 mmHg. FINDINGS  Left Ventricle: Left ventricular ejection fraction, by estimation, is 55 to 60%. The left ventricle has normal function. The left ventricle has no  regional wall motion abnormalities. The left ventricular internal cavity size was normal in size. There is  mild concentric left ventricular hypertrophy. Left ventricular diastolic parameters are consistent with Grade II diastolic dysfunction (pseudonormalization). Right Ventricle: The right ventricular size is mildly enlarged. No increase in right ventricular wall thickness. Right ventricular systolic function is normal. There is normal pulmonary artery systolic pressure. The tricuspid regurgitant velocity is 2.10  m/s, and with an assumed right atrial pressure of 3 mmHg, the estimated right ventricular systolic pressure is 20.6 mmHg. Left Atrium: Left atrial size was moderately dilated. Right Atrium: Right atrial size was normal in size. Pericardium: There is no evidence of pericardial effusion. Mitral Valve: The mitral valve is degenerative in appearance. There is mild calcification of the mitral valve leaflet(s). Moderate mitral annular calcification. Trivial mitral valve regurgitation. No evidence of mitral valve stenosis. Tricuspid Valve: The tricuspid valve is normal in structure. Tricuspid valve regurgitation is trivial. Aortic Valve: Bioprosthetic aortic valve replacement. Mean gradient 7 mmHg, no significant stenosis. No significant peri-valvular leakage noted. The aortic valve has been repaired/replaced. Aortic valve regurgitation is not visualized. Aortic valve mean gradient measures 7.0 mmHg. Aortic valve peak gradient measures 15.1 mmHg. Aortic valve area, by VTI measures 1.36 cm. Pulmonic Valve: The pulmonic valve was normal in structure. Pulmonic valve regurgitation is mild. Aorta: The ascending aorta was not well visualized and aortic dilatation noted. There is mild dilatation of the ascending aorta, measuring 37 mm. Venous: The inferior vena cava is normal in size with greater than 50% respiratory variability, suggesting right atrial pressure of 3 mmHg. IAS/Shunts: No atrial level shunt detected  by color flow Doppler.  LEFT VENTRICLE PLAX 2D LVIDd:         4.40 cm   Diastology LVIDs:         3.00 cm   LV e' medial:    6.64 cm/s LV PW:         1.10 cm   LV E/e' medial:  17.8 LV IVS:        1.20 cm   LV e' lateral:   12.80 cm/s LVOT diam:     1.80 cm   LV E/e' lateral: 9.2 LV SV:         58 LV SV Index:   29 LVOT Area:  2.54 cm  RIGHT VENTRICLE RV Basal diam:  4.50 cm RV Mid diam:    3.20 cm RV S prime:     10.70 cm/s TAPSE (M-mode): 2.5 cm RVSP:           20.6 mmHg LEFT ATRIUM             Index        RIGHT ATRIUM           Index LA diam:        4.70 cm 2.35 cm/m   RA Pressure: 3.00 mmHg LA Vol (A2C):   71.8 ml 35.96 ml/m  RA Area:     12.40 cm LA Vol (A4C):   92.1 ml 46.12 ml/m  RA Volume:   24.70 ml  12.37 ml/m LA Biplane Vol: 89.2 ml 44.67 ml/m  AORTIC VALVE AV Area (Vmax):    1.13 cm AV Area (Vmean):   1.31 cm AV Area (VTI):     1.36 cm AV Vmax:           194.00 cm/s AV Vmean:          122.000 cm/s AV VTI:            0.423 m AV Peak Grad:      15.1 mmHg AV Mean Grad:      7.0 mmHg LVOT Vmax:         86.20 cm/s LVOT Vmean:        62.700 cm/s LVOT VTI:          0.226 m LVOT/AV VTI ratio: 0.53  AORTA Ao Root diam: 3.20 cm Ao Asc diam:  3.70 cm MITRAL VALVE                TRICUSPID VALVE MV Area (PHT):              TR Peak grad:   17.6 mmHg MV Decel Time:              TR Vmax:        210.00 cm/s MV E velocity: 118.00 cm/s  Estimated RAP:  3.00 mmHg MV A velocity: 104.00 cm/s  RVSP:           20.6 mmHg MV E/A ratio:  1.13                             SHUNTS                             Systemic VTI:  0.23 m                             Systemic Diam: 1.80 cm Dalton McleanMD Electronically signed by Wilfred Lacyalton McleanMD Signature Date/Time: 06/27/2022/1:46:07 PM    Final      Additional studies/ records that were reviewed today include:   ECHO SUMMARY:03/14/2019 Left Ventricle: Normal LV size and function with EF 60-65%. There is mild hypertrophy of the basal septum. Diastolic dysfunction is  inderterminant. There is increased LV filling pressures. Right Ventricle: Normal RV size and RVF. Right Atrium: Normal size. Left Atrium: Severely dilated. Mitral Valve: Moderate thickening and calcification of the anterior MV leaflet with moderate mitral annular calcification and trivial MR. Aortic Valve: S/P bioprosthetic AVR that appears to be functioning normally. Aortic valve mean gradient measures 10.0 mmHg. Aortic valve peak gradient  measures 18.7 mmHg. Aortic valve area, by VTI measures 1.32 cm. There is no perivalvular AI. Pulmonic Valve: There is mild pulmonic regurgitation. Tricuspid Valve: There is trivial tricuspid regurgitation. Aorta: Mildly dilated ascending aorta at 84mm.   FINDINGS:   Left Ventricle: Left ventricular ejection fraction, by visual estimation, is 60 to 65%. The left ventricle has normal function. There is mildly increased left ventricular hypertrophy of the basal septum. Left ventricular diastolic parameters are  indeterminate. Elevated left ventricular end-diastolic pressure.   Right Ventricle: The right ventricular size is normal. No increase in right ventricular wall thickness. Global RV systolic function is has normal systolic function. The tricuspid regurgitant velocity is 2.07 m/s, and with an assumed right atrial pressure  of 3 mmHg, the estimated right ventricular systolic pressure is normal at 20.2 mmHg.   Left Atrium: Left atrial size was severely dilated.   Right Atrium: Right atrial size was normal in size   Pericardium: There is no evidence of pericardial effusion.   Mitral Valve: The mitral valve is normal in structure. There is moderate thickening of the anterior mitral valve leaflet(s). There is moderate calcification of the anterior mitral valve leaflet(s). Moderate mitral annular calcification. No evidence of  mitral valve stenosis by observation. MV peak gradient, 6.7 mmHg. Trace mitral valve regurgitation.   Tricuspid Valve: The  tricuspid valve is normal in structure. Tricuspid valve regurgitation is mild.   Aortic Valve: The aortic valve is normal in structure. Aortic valve regurgitation is not visualized. The aortic valve is structurally normal, with no evidence of sclerosis or stenosis. Aortic valve mean gradient measures 10.0 mmHg. Aortic valve peak  gradient measures 18.7 mmHg. Aortic valve area, by VTI measures 1.32 cm.   Pulmonic Valve: The pulmonic valve was normal in structure. Pulmonic valve regurgitation is mild.   Aorta: Aortic dilatation noted. There is mild dilatation of the ascending aorta measuring 39 mm.   Venous: The inferior vena cava is normal in size with greater than 50% respiratory variability, suggesting right atrial pressure of 3 mmHg.   IAS/Shunts: No atrial level shunt detected by color flow Doppler. No ventricular septal defect is seen or detected. There is no evidence of an atrial septal defect.       LEFT VENTRICLE PLAX 2D LVIDd:         4.12 cm  Diastology LVIDs:         3.08 cm  LV e' lateral:   11.20 cm/s LV PW:         0.97 cm  LV E/e' lateral: 9.1 LV IVS:        1.25 cm  LV e' medial:    5.98 cm/s LVOT diam:     2.00 cm  LV E/e' medial:  17.1 LV SV:         38 ml LV SV Index:   18.76 LVOT Area:     3.14 cm     RIGHT VENTRICLE RV Basal diam:  4.88 cm RV S prime:     12.50 cm/s TAPSE (M-mode): 2.5 cm RVSP:           20.2 mmHg   LEFT ATRIUM              Index       RIGHT ATRIUM           Index LA diam:        5.00 cm  2.52 cm/m  RA Pressure: 3.00 mmHg LA Vol (A2C):   95.6 ml  48.15 ml/m RA Area:     19.05 cm LA Vol (A4C):   101.0 ml 50.87 ml/m RA Volume:   50.85 ml  25.61 ml/m LA Biplane Vol: 101.0 ml 50.87 ml/m  AORTIC VALVE AV Area (Vmax):    1.22 cm AV Area (Vmean):   1.30 cm AV Area (VTI):     1.32 cm AV Vmax:           216.00 cm/s AV Vmean:          143.000 cm/s AV VTI:            0.516 m AV Peak Grad:      18.7 mmHg AV Mean Grad:      10.0  mmHg LVOT Vmax:         84.20 cm/s LVOT Vmean:        59.000 cm/s LVOT VTI:          0.217 m LVOT/AV VTI ratio: 0.42   AORTA Ao Root diam: 3.50 cm   MITRAL VALVE                         TRICUSPID VALVE MV Area (PHT): 2.80 cm              TR Peak grad:   17.2 mmHg MV Peak grad:  6.7 mmHg              TR Vmax:        263.00 cm/s MV Mean grad:  2.0 mmHg              Estimated RAP:  3.00 mmHg MV Vmax:       1.29 m/s              RVSP:           20.2 mmHg MV Vmean:      67.4 cm/s MV VTI:        0.42 m                SHUNTS MV PHT:        78.59 msec            Systemic VTI:  0.22 m MV Decel Time: 271 msec              Systemic Diam: 2.00 cm MV E velocity: 102.00 cm/s 103 cm/s MV A velocity: 112.00 cm/s 70.3 cm/s MV E/A ratio:  0.91        1.5    LE Vascular US: 01/13/2020  Summary:  Right: Resting right ankle-brachial index indicates noncompressible right  lower extremity arteries. The right toe-brachial index is normal.   Left: Resting left ankle-brachial index indicates noncompressible left  lower extremity arteries. The left toe-brachial index is normal.    ECHO: 12/03/2020 IMPRESSIONS   1. Left ventricular ejection fraction, by estimation, is 60 to 65%. The  left ventricle has normal function. The left ventricle has no regional  wall motion abnormalities. There is moderate concentric left ventricular  hypertrophy. Left ventricular  diastolic parameters are consistent with Grade II diastolic dysfunction  (pseudonormalization). Elevated left atrial pressure.   2. Right ventricular systolic function is normal. The right ventricular  size is normal.   3. Left atrial size was moderately dilated.   4. The mitral valve is normal in structure. Trivial mitral valve  regurgitation. No evidence of mitral stenosis.   5. The aortic valve has been repaired/replaced. Aortic valve  regurgitation is not  visualized. No aortic stenosis is present. There is a  bioprosthetic valve present in  the aortic position. Echo findings are  consistent with normal structure and function  of the aortic valve prosthesis. Aortic valve mean gradient measures 10.9  mmHg. Aortic valve Vmax measures 2.27 m/s. Aortic valve acceleration time  measures 92 msec.   6. There is borderline dilatation of the ascending aorta, measuring 37  mm.   7. The inferior vena cava is normal in size with greater than 50%  respiratory variability, suggesting right atrial pressure of 3 mmHg.   Comparison(s): No significant change from prior study.   ECHO: 06/27/2022 1. Left ventricular ejection fraction, by estimation, is 55 to 60%. The  left ventricle has normal function. The left ventricle has no regional  wall motion abnormalities. There is mild concentric left ventricular  hypertrophy. Left ventricular diastolic  parameters are consistent with Grade II diastolic dysfunction  (pseudonormalization).   2. Right ventricular systolic function is normal. The right ventricular  size is mildly enlarged. There is normal pulmonary artery systolic  pressure. The estimated right ventricular systolic pressure is 20.6 mmHg.   3. Left atrial size was moderately dilated.   4. The mitral valve is degenerative. Trivial mitral valve regurgitation.  No evidence of mitral stenosis. Moderate mitral annular calcification.   5. Bioprosthetic aortic valve replacement. Mean gradient 7 mmHg, no  significant stenosis. No significant peri-valvular leakage noted.   6. Aortic dilatation noted. There is mild dilatation of the ascending  aorta, measuring 37 mm.   7. The inferior vena cava is normal in size with greater than 50%  respiratory variability, suggesting right atrial pressure of 3 mmHg.    ASSESSMENT:    1. Severe aortic stenosis: s/p AVR May 13, 2013 with a 25 mm Edwards magna ease bovine pericardial valve   2. Primary hypertension   3. First degree AV block   4. Pure hypercholesterolemia   5. Tremor of left hand   6.  GERD without esophagitis      PLAN:  Mr. Chinita Pesterntonowitz is a 77 year-old gentleman who developed severe aortic stenosis and underwent successful AVR with a 25 mm [facility] Magna Ease bovine pericardial tissue valve on May 13, 2013.  He has a history of hypertension and hyperlipidemia.  On his echo Doppler study of March 14, 2019 his bioprosthetic valve remained stable with normal gradients and without evidence for perivalvular leak.  EF was 60 to 65% with mild left ventricular hypertrophy of the basal septum.  He had moderate mitral annular calcification with trivial MR, mild PR and his ascending aorta measured 39 mm.  A follow-up echo on December 03, 2020 remained stable and showed normal LV function with EF 60 to 65%.  There was moderate concentric LVH with grade 2 diastolic dysfunction and moderate left atrial dilatation.  His aortic valve gradients were stable with a mean gradient of 10.9 and peak gradient of 20.5.  His most recent echo from June 27, 2022 was reviewed which showed EF 55 to 60% with grade 2 diastolic dysfunction.  His mean bioprosthetic aortic gradient was 7 mm.  There is no perivalvular leak.  There was mild dilation of ascending aorta at 37 mm.  Presently, blood pressure is stable without orthostatic change on amlodipine 5 mg, carvedilol 6.25 mg twice a day and lisinopril 40 mg daily.  He continues to be on Zetia/simvastatin 10/40 combination therapy with generic Vytorin.  Over the past year, Mr. Hibberd has noticed that he has  slowed down.  He continues to go on his 20 to 25-minute walks but often he may need to stop and towards the end of the walk he seems to have some difficulty in elevating his legs as much.  On exam, his posture seem more forward.  There was no obvious shuffle gait.  He does have a left hand tremor.  There is no definitive cogwheel rigidity.  He tells me his wife believes his thinking is slower and that he does not appear to be as quick as he had in the past.  I  suggested that if he notices progression of symptomatology it may be worthwhile to consider neurologic evaluation.  I discussed potential features associated with Parkinson's disease.  He continues to be followed by Dr. Wylene Simmer and will be seeing him in late summer.  I will see him in 9 months for evaluation.   Medication Adjustments/Labs and Tests Ordered: Current medicines are reviewed at length with the patient today.  Concerns regarding medicines are outlined above.  Medication changes, Labs and Tests ordered today are listed in the Patient Instructions below. Patient Instructions  Medication Instructions:  Your physician recommends that you continue on your current medications as directed. Please refer to the Current Medication list given to you today.  *If you need a refill on your cardiac medications before your next appointment, please call your pharmacy*  Follow-Up: At [facility], you and your health needs are our priority.  As part of our continuing mission to provide you with exceptional heart care, we have created designated Provider Care Teams.  These Care Teams include your primary Cardiologist (physician) and Advanced Practice Providers (APPs -  Physician Assistants and Nurse Practitioners) who all work together to provide you with the care you need, when you need it.  We recommend signing up for the patient portal called "MyChart".  Sign up information is provided on this After Visit Summary.  MyChart is used to connect with patients for Virtual Visits (Telemedicine).  Patients are able to view lab/test results, encounter notes, upcoming appointments, etc.  Non-urgent messages can be sent to your provider as well.   To learn more about what you can do with MyChart, go to [email or web address].    Your next appointment:   November/December with Dr. Tresa Endo      Signed, Nicki Guadalajara, MD  07/18/2022 12:03 PM    [facility] [address]  [address], [place], [place]  [address] Phone: [phone]

## 2022-07-18 ENCOUNTER — Encounter: Payer: Self-pay | Admitting: Cardiovascular Disease

## 2022-11-11 ENCOUNTER — Telehealth: Payer: Self-pay | Admitting: Cardiovascular Disease

## 2022-11-11 NOTE — Telephone Encounter (Signed)
Patient states chest tightness for 2-3 weeks.  Started low under abdomen and moves around.  When standing up straight it there I nothing, but when sitting down he feels.  Seems more diaphragm are. Not a great amount of pain. Goes on walks and does normal things. No Sob, no nausea, no radiating pain. Scale 1-10 it is  1/2. No issues with sleeping. Takes omeprazole daily. No excess belching or heartburn. Has lost weight , not overeating.  ED precautions given and scheduled for Monday.

## 2022-11-11 NOTE — Telephone Encounter (Signed)
Patient states that he is having chest tightness not necessary pain. He stated that he has been going on for two weeks now. Started in the  Abdomen, and has worked it way up to the chest area. Please advise

## 2022-11-14 ENCOUNTER — Ambulatory Visit: Payer: Medicare HMO | Attending: Physician Assistant | Admitting: Physician Assistant

## 2022-11-14 ENCOUNTER — Encounter: Payer: Self-pay | Admitting: Physician Assistant

## 2022-11-14 VITALS — BP 112/72 | HR 59 | Ht 69.0 in | Wt 175.2 lb

## 2022-11-14 DIAGNOSIS — I350 Nonrheumatic aortic (valve) stenosis: Secondary | ICD-10-CM | POA: Diagnosis not present

## 2022-11-14 DIAGNOSIS — E785 Hyperlipidemia, unspecified: Secondary | ICD-10-CM | POA: Diagnosis not present

## 2022-11-14 DIAGNOSIS — I10 Essential (primary) hypertension: Secondary | ICD-10-CM | POA: Diagnosis not present

## 2022-11-14 DIAGNOSIS — R0789 Other chest pain: Secondary | ICD-10-CM | POA: Diagnosis not present

## 2022-11-14 DIAGNOSIS — Z1389 Encounter for screening for other disorder: Secondary | ICD-10-CM | POA: Diagnosis not present

## 2022-11-14 MED ORDER — METOPROLOL TARTRATE 50 MG PO TABS: 50.0000 mg | ORAL_TABLET | Freq: Once | ORAL | 0 refills | Status: DC

## 2022-11-14 MED ORDER — METOPROLOL SUCCINATE ER 50 MG PO TB24: 50.0000 mg | ORAL_TABLET | Freq: Every day | ORAL | 0 refills | Status: DC

## 2022-11-14 NOTE — Patient Instructions (Signed)
Medication Instructions:  FOR THE MORNING OF THE CT SCAN, HOLD THE CARVEDILOL.  YOU ARE TO TAKE THE ONE METOPROLOL 50MG   TABLET 2 HOURS BEFORE THE CT PROCEDURE. *If you need a refill on your cardiac medications before your next appointment, please call your pharmacy*   Lab Work: LAB WILL BE DRAWN TODAY If you have labs (blood work) drawn today and your tests are completely normal, you will receive your results only by: MyChart Message (if you have MyChart) OR A paper copy in the mail If you have any lab test that is abnormal or we need to change your treatment, we will call you to review the results.   Testing/Procedures:   Your cardiac CT will be scheduled at one of the below locations:   [facility] [address] [place], [place] [address] [phone]  OR  [facility] [address] [place], [place] [address] [phone]  OR   [facility] [address] [place], [place] [address] [phone]  If scheduled at [facility], please arrive at the [facility]'s Entrance (Entrance C2) of [facility] 30 minutes prior to test start time. You can use the FREE valet parking offered at entrance C (encouraged to control the heart rate for the test)  Proceed to the [facility] Radiology Department (first floor) to check-in and test prep.  All radiology patients and guests should use entrance C2 at [facility], accessed from [facility], even though the hospital's physical address listed is [address].    If scheduled at [facility] or [facility], please arrive 15 mins early for check-in and test prep. There is spacious parking and easy access to the radiology department from the [facility] Heart and Vascular entrance. Please enter here and check-in with the desk attendant.    Please follow these  instructions carefully (unless otherwise directed):  An IV will be required for this test and Nitroglycerin will be given.  Hold all erectile dysfunction medications at least 3 days (72 hrs) prior to test. (Ie viagra, cialis, sildenafil, tadalafil, etc)   On the Night Before the Test: Be sure to Drink plenty of water. Do not consume any caffeinated/decaffeinated beverages or chocolate 12 hours prior to your test. Do not take any antihistamines 12 hours prior to your test. If the patient has contrast allergy: Patient will need a prescription for Prednisone and very clear instructions (as follows): Prednisone 50 mg - take 13 hours prior to test Take another Prednisone 50 mg 7 hours prior to test Take another Prednisone 50 mg 1 hour prior to test   On the Day of the Test: Drink plenty of water until 1 hour prior to the test. Do not eat any food 1 hour prior to test. You may take your regular medications prior to the test.  Take metoprolol (Lopressor) two hours prior to test. If you take Furosemide/Hydrochlorothiazide/Spironolactone, please HOLD on the morning of the test. FEMALES- please wear underwire-free bra if available, avoid dresses & tight clothing         After the Test: Drink plenty of water. After receiving IV contrast, you may experience a mild flushed feeling. This is normal. On occasion, you may experience a mild rash up to 24 hours after the test. This is not dangerous. If this occurs, you can take Benadryl 25 mg and increase your fluid intake. If you experience trouble  breathing, this can be serious. If it is severe call 911 IMMEDIATELY. If it is mild, please call our office. If you take any of these medications: Glipizide/Metformin, Avandament, Glucavance, please do not take 48 hours after completing test unless otherwise instructed.  We will call to schedule your test 2-4 weeks out understanding that some insurance companies will need an authorization prior to the  service being performed.   For more information and frequently asked questions, please visit our website : [email or web address]  For non-scheduling related questions, please contact the cardiac imaging nurse navigator should you have any questions/concerns: Rockwell Alexandria, Cardiac Imaging Nurse Navigator Larey Brick, Cardiac Imaging Nurse Navigator [facility] Direct Office Dial: [phone]   For scheduling needs, including cancellations and rescheduling, please call [place], [phone].    Follow-Up: At [facility], you and your health needs are our priority.  As part of our continuing mission to provide you with exceptional heart care, we have created designated Provider Care Teams.  These Care Teams include your primary Cardiologist (physician) and Advanced Practice Providers (APPs -  Physician Assistants and Nurse Practitioners) who all work together to provide you with the care you need, when you need it.  We recommend signing up for the patient portal called "MyChart".  Sign up information is provided on this After Visit Summary.  MyChart is used to connect with patients for Virtual Visits (Telemedicine).  Patients are able to view lab/test results, encounter notes, upcoming appointments, etc.  Non-urgent messages can be sent to your provider as well.   To learn more about what you can do with MyChart, go to [email or web address].    Your next appointment:  KEEP SCHEDULED APPOINTMENT WITH DR. Tresa Endo    Provider:   Nicki Guadalajara, MD

## 2022-11-14 NOTE — Progress Notes (Unsigned)
  Cardiology Office Note:  .   Date:  11/14/2022  ID:  Drew Andrews, DOB 07-28-1945, MRN [id number] PCP: Gaspar Garbe, MD  [facility] Providers Cardiologist:  Nicki Guadalajara, MD { Click to update primary MD,subspecialty MD or APP then REFRESH:1}   History of Present Illness: Drew Andrews   Drew Andrews is a 77 y.o. male with past medical history of aortic stenosis s/p AVR by Dr. Laneta Simmers 05/13/2013, minimal CAD, PAD, hypertension, and hyperlipidemia.  Last cardiac catheterization performed by Dr. Tresa Endo on 03/15/2013 prior to AVR showed mild 20% smooth narrowing in proximal LAD, normal left main, normal left circumflex and RCA.  Most recent echocardiogram obtained on 06/27/2022 showed EF 55 to 60%, grade 2 DD, mildly enlarged RV with RVSP 20.6 mmHg, moderate LAE, trivial MR, bioprosthetic aortic valve present with no significant perivalvular leakage, mild dilatation of the ascending aorta measuring at 37 mm.  Previous ABI in September 2021 showed noncompressible arteries.  He was most recently seen by Dr. Tresa Endo on 07/13/2022 at which time he has noticed more fatigability but no chest pain or shortness of breath.  Patient presents today for evaluation of chest discomfort.  He says over the past several weeks, he has been having a tightness in the chest almost on daily basis.  Symptom typically occurs when he watches TV or stood up, but gets better when he started walking.  It can occur up to an hour at a time.  He has chronic leg weakness.  He also mentions that pain moves around in the chest and also goes to his stomach and back to the chest.  He says the pain is very mild.  On physical exam, he has normal pulses bilaterally, his blood pressure is normal, suspicion for aortic dissection fairly low.  I recommend a coronary CT to further assess.  He will need a basic metabolic panel and a CBC prior to the coronary CT.  He also has parkinsonian gait on physical exam as well, I recommended a  referral to neurology service.  ROS: ***  Studies Reviewed: .        *** Risk Assessment/Calculations:   {Does this patient have ATRIAL FIBRILLATION?[phone]}         Physical Exam:   VS:  BP 112/72   Pulse (!) 59   Ht 5\' 9"  (1.753 m)   Wt 175 lb 3.2 oz (79.5 kg)   SpO2 100%   BMI 25.87 kg/m    Wt Readings from Last 3 Encounters:  11/14/22 175 lb 3.2 oz (79.5 kg)  07/13/22 181 lb (82.1 kg)  10/27/21 184 lb 9.6 oz (83.7 kg)    GEN: Well nourished, well developed in no acute distress NECK: No JVD; No carotid bruits CARDIAC: ***RRR, no murmurs, rubs, gallops RESPIRATORY:  Clear to auscultation without rales, wheezing or rhonchi  ABDOMEN: Soft, non-tender, non-distended EXTREMITIES:  No edema; No deformity   ASSESSMENT AND PLAN: .   ***    {Are you ordering a CV Procedure (e.g. stress test, cath, DCCV, TEE, etc)?   Press F2        :[id number]}  Dispo: ***  Signed, Azalee Course, PA

## 2022-11-15 ENCOUNTER — Telehealth (HOSPITAL_COMMUNITY): Payer: Self-pay | Admitting: *Deleted

## 2022-11-15 LAB — CBC
Hematocrit: 36.7 % — ABNORMAL LOW (ref 37.5–51.0)
Hemoglobin: 12.6 g/dL — ABNORMAL LOW (ref 13.0–17.7)
MCH: 35.4 pg — ABNORMAL HIGH (ref 26.6–33.0)
MCHC: 34.3 g/dL (ref 31.5–35.7)
MCV: 103 fL — ABNORMAL HIGH (ref 79–97)
Platelets: 151 10*3/uL (ref 150–450)
RBC: 3.56 x10E6/uL — ABNORMAL LOW (ref 4.14–5.80)
RDW: 12.4 % (ref 11.6–15.4)
WBC: 7.8 10*3/uL (ref 3.4–10.8)

## 2022-11-15 LAB — BASIC METABOLIC PANEL
BUN/Creatinine Ratio: 20 (ref 10–24)
BUN: 24 mg/dL (ref 8–27)
CO2: 23 mmol/L (ref 20–29)
Calcium: 9.4 mg/dL (ref 8.6–10.2)
Chloride: 104 mmol/L (ref 96–106)
Creatinine, Ser: 1.2 mg/dL (ref 0.76–1.27)
Glucose: 97 mg/dL (ref 70–99)
Potassium: 4.9 mmol/L (ref 3.5–5.2)
Sodium: 141 mmol/L (ref 134–144)
eGFR: 62 mL/min/{1.73_m2} (ref >59)

## 2022-11-15 NOTE — Telephone Encounter (Signed)
Returning patient's message. Patient called to schedule his cardiac CT scan.  Appointment made for Friday, July 19 at 3:30 pm.   Patient verbalizes possession of instructions and will hold his carvedilol and lisinopril on the day of his test and take 50mg  metoprolol tartrate two hours prior to his cardiac CT scan.  He is aware to arrive at 3 PM.  He denies allergy to IV contrast. He will call back if he has any further questions and is aware that the RN navigators will call him only if his CCTA is not approved by insurance.  Larey Brick RN Navigator Cardiac Imaging [facility] [phone] Office [phone] Cell

## 2022-11-18 ENCOUNTER — Telehealth: Payer: Self-pay

## 2022-11-18 ENCOUNTER — Other Ambulatory Visit: Payer: Self-pay | Admitting: Cardiology

## 2022-11-18 ENCOUNTER — Ambulatory Visit (HOSPITAL_BASED_OUTPATIENT_CLINIC_OR_DEPARTMENT_OTHER)
Admission: RE | Admit: 2022-11-18 | Discharge: 2022-11-18 | Disposition: A | Discharge status: Home / Self Care | Payer: Medicare HMO | Source: Ambulatory Visit | Attending: Cardiology | Admitting: Cardiology

## 2022-11-18 ENCOUNTER — Ambulatory Visit (HOSPITAL_COMMUNITY)
Admission: RE | Admit: 2022-11-18 | Discharge: 2022-11-18 | Disposition: A | Discharge status: Home / Self Care | Payer: Medicare HMO | Source: Ambulatory Visit | Attending: Physician Assistant | Admitting: Physician Assistant

## 2022-11-18 DIAGNOSIS — R931 Abnormal findings on diagnostic imaging of heart and coronary circulation: Secondary | ICD-10-CM

## 2022-11-18 DIAGNOSIS — I2510 Atherosclerotic heart disease of native coronary artery without angina pectoris: Secondary | ICD-10-CM | POA: Diagnosis not present

## 2022-11-18 DIAGNOSIS — R0789 Other chest pain: Secondary | ICD-10-CM | POA: Diagnosis not present

## 2022-11-18 MED ORDER — NITROGLYCERIN 0.4 MG SL SUBL
0.8000 mg | SUBLINGUAL_TABLET | SUBLINGUAL | Status: DC | PRN
  Administered 2022-11-18: 0.8 mg via SUBLINGUAL

## 2022-11-18 MED ORDER — IOHEXOL 350 MG/ML SOLN
95.0000 mL | Freq: Once | INTRAVENOUS | Status: AC | PRN
  Administered 2022-11-18: 95 mL via INTRAVENOUS

## 2022-11-18 NOTE — Progress Notes (Signed)
Moderate coronary artery disease based on FFR which is reassuring. No obvious sign of severe blockage. High calcium score indicate increased future cardiovascular risk, will need to focus on medical therapy.

## 2022-11-18 NOTE — Telephone Encounter (Signed)
Patient aware of lab results. Verbalized understanding.

## 2022-11-18 NOTE — Progress Notes (Signed)
See separate FFR report. Overall moderate disease. Pending noncardiac portion read by radiologist.

## 2022-11-18 NOTE — Telephone Encounter (Signed)
-----   Message from Ronney Asters sent at 11/17/2022  7:01 AM EDT -----  ----- Message ----- From: Azalee Course, PA Sent: 11/15/2022   4:30 PM EDT To: Alyson Ingles, LPN  Stable red blood cell count. Stable renal function and electrolyte.

## 2022-11-18 NOTE — Progress Notes (Signed)
Patient tolerated without distress 

## 2022-11-21 ENCOUNTER — Telehealth: Payer: Self-pay | Admitting: Cardiovascular Disease

## 2022-11-21 NOTE — Telephone Encounter (Signed)
Patient's wife is calling to follow up on patient's CT results.

## 2022-11-21 NOTE — Telephone Encounter (Signed)
Spoke with patient and wife. They are aware of Ct results. Verbalized understanding

## 2022-12-06 ENCOUNTER — Telehealth: Payer: Self-pay | Admitting: Cardiovascular Disease

## 2022-12-06 NOTE — Telephone Encounter (Signed)
Returned pt call. Pt is having some chest pain. It is mild sitting down or laying down in the chair. When standing or laying down it is not that bad. Chest feels tight and some pressure as well. Pt's wife states he may have parkinson's as they have an appt next week and pt is worried about that as well. She is thinking it could be anxiety. Some dizziness and fatigue and no headache. BP 135/87 and HR is 67. Pt is having a hard time focusing on things. Pt is to see a Neurologist and this is in September. Pt states his shaking has gotten worse as well. Pt states nitroglycerin was mentioned and he is wondering if that could be prescribed. Please advise.

## 2022-12-06 NOTE — Telephone Encounter (Signed)
Pt c/o medication issue:  1. Name of Medication: Nitroglycerin  2. How are you currently taking this medication (dosage and times per day)?   3. Are you having a reaction (difficulty breathing--STAT)? No  4. What is your medication issue? Patient would like to know if he can be prescribed a medication to help ease the chest pain. Please advise.

## 2022-12-09 NOTE — Telephone Encounter (Signed)
Patient was calling back for update. Said he is in pain.  Please advise

## 2022-12-09 NOTE — Telephone Encounter (Signed)
Pt called and he states he was still waiting on a call for advice from provider. Only having chest pain while sitting 2/10 and lasts the whole time he is sitting and when he lays down everything goes away. Pt also gets a tightness in his chest. No SOB, dizziness or lightheadedness.He may have Parkinson's and wife feels this may be anxiety related. Wife states he has a lot of shaking in his hands, has shuffling and his voice is slow, shakey, studdoring and lapsed. He is losing weight and stumbling a bit. BP 145/98 Hr 69-He did take his meds this a.m. Please advise.

## 2022-12-09 NOTE — Telephone Encounter (Signed)
Appointment scheduled with [facility]

## 2022-12-13 DIAGNOSIS — I2510 Atherosclerotic heart disease of native coronary artery without angina pectoris: Secondary | ICD-10-CM | POA: Diagnosis not present

## 2022-12-13 DIAGNOSIS — Z973 Presence of spectacles and contact lenses: Secondary | ICD-10-CM | POA: Diagnosis not present

## 2022-12-13 DIAGNOSIS — I10 Essential (primary) hypertension: Secondary | ICD-10-CM | POA: Diagnosis not present

## 2022-12-13 DIAGNOSIS — R32 Unspecified urinary incontinence: Secondary | ICD-10-CM | POA: Diagnosis not present

## 2022-12-13 DIAGNOSIS — Z7982 Long term (current) use of aspirin: Secondary | ICD-10-CM | POA: Diagnosis not present

## 2022-12-13 DIAGNOSIS — N529 Male erectile dysfunction, unspecified: Secondary | ICD-10-CM | POA: Diagnosis not present

## 2022-12-13 DIAGNOSIS — Z8249 Family history of ischemic heart disease and other diseases of the circulatory system: Secondary | ICD-10-CM | POA: Diagnosis not present

## 2022-12-13 DIAGNOSIS — K219 Gastro-esophageal reflux disease without esophagitis: Secondary | ICD-10-CM | POA: Diagnosis not present

## 2022-12-13 DIAGNOSIS — Z008 Encounter for other general examination: Secondary | ICD-10-CM | POA: Diagnosis not present

## 2022-12-13 DIAGNOSIS — Z792 Long term (current) use of antibiotics: Secondary | ICD-10-CM | POA: Diagnosis not present

## 2022-12-13 DIAGNOSIS — Z87891 Personal history of nicotine dependence: Secondary | ICD-10-CM | POA: Diagnosis not present

## 2022-12-13 DIAGNOSIS — E785 Hyperlipidemia, unspecified: Secondary | ICD-10-CM | POA: Diagnosis not present

## 2022-12-21 ENCOUNTER — Ambulatory Visit: Payer: Medicare HMO | Admitting: Physician Assistant

## 2022-12-21 ENCOUNTER — Encounter: Payer: Self-pay | Admitting: Physician Assistant

## 2022-12-21 VITALS — BP 125/77 | HR 68 | Ht 69.0 in | Wt 166.0 lb

## 2022-12-21 DIAGNOSIS — I10 Essential (primary) hypertension: Secondary | ICD-10-CM

## 2022-12-21 DIAGNOSIS — E785 Hyperlipidemia, unspecified: Secondary | ICD-10-CM | POA: Diagnosis not present

## 2022-12-21 DIAGNOSIS — Z952 Presence of prosthetic heart valve: Secondary | ICD-10-CM | POA: Diagnosis not present

## 2022-12-21 DIAGNOSIS — R0789 Other chest pain: Secondary | ICD-10-CM | POA: Diagnosis not present

## 2022-12-21 DIAGNOSIS — R931 Abnormal findings on diagnostic imaging of heart and coronary circulation: Secondary | ICD-10-CM

## 2022-12-21 DIAGNOSIS — I2510 Atherosclerotic heart disease of native coronary artery without angina pectoris: Secondary | ICD-10-CM | POA: Diagnosis not present

## 2022-12-21 NOTE — Patient Instructions (Signed)
Medication Instructions:  Your physician recommends that you continue on your current medications as directed. Please refer to the Current Medication list given to you today.  *If you need a refill on your cardiac medications before your next appointment, please call your pharmacy*   Lab Work: NONE ordered at this time of appointment   Testing/Procedures: NONE ordered at this time of appointment     Follow-Up: At [facility], you and your health needs are our priority.  As part of our continuing mission to provide you with exceptional heart care, we have created designated Provider Care Teams.  These Care Teams include your primary Cardiologist (physician) and Advanced Practice Providers (APPs -  Physician Assistants and Nurse Practitioners) who all work together to provide you with the care you need, when you need it.  We recommend signing up for the patient portal called "MyChart".  Sign up information is provided on this After Visit Summary.  MyChart is used to connect with patients for Virtual Visits (Telemedicine).  Patients are able to view lab/test results, encounter notes, upcoming appointments, etc.  Non-urgent messages can be sent to your provider as well.   To learn more about what you can do with MyChart, go to [email or web address].    Your next appointment:   3-4 month(s)  Provider:   Nicki Guadalajara, MD

## 2022-12-21 NOTE — Progress Notes (Signed)
Cardiology Office Note:  .   Date:  12/23/2022  ID:  Drew Andrews, DOB 1946-01-05, MRN [id number] PCP: Gaspar Garbe, MD  [facility] Providers Cardiologist:  Nicki Guadalajara, MD     History of Present Illness: [name]   Drew Andrews is a 77 y.o. male with past medical history of aortic stenosis s/p AVR by Dr. Laneta Simmers 05/13/2013, minimal CAD, PAD, hypertension, and hyperlipidemia.  Last cardiac catheterization performed by Dr. Tresa Endo on 03/15/2013 prior to AVR showed mild 20% smooth narrowing in proximal LAD, normal left main, normal left circumflex and RCA.  Most recent echocardiogram obtained on 06/27/2022 showed EF 55 to 60%, grade 2 DD, mildly enlarged RV with RVSP 20.6 mmHg, moderate LAE, trivial MR, bioprosthetic aortic valve present with no significant perivalvular leakage, mild dilatation of the ascending aorta measuring at 37 mm.  Previous ABI in September 2021 showed noncompressible arteries.  I last saw the patient in July 2024 for chest pain.  His chest pain was very atypical, and never had It is when he exerted himself and always happens when he is sitting down.  Subsequent coronary CT showed mild to moderate nonobstructive disease with reassuring FFR.  Patient presents today for follow-up.  He continues to have occasional chest pain.  His chest pain is brought on when he lay against anything on his back.  He says it is most pronounced when he is laying down in a lazy boy chair.  Anything that presses on his back will cause the chest to hurt.  However he can mow the entire yard without any exertional chest pain.  He is overdue for lipid panel, last lipid panel again in September 2024 showed very well-controlled LDL.  I recommended continue to observe his atypical symptoms.  If symptom worsens or changes, he will let us know.  His symptom is quite atypical.  I did referred the patient to neurology service for parkinsonian gait, he has follow-up with neurology next week.   He can follow up with Dr. Tresa Endo in 3 to 76-month.  ROS:   He denies palpitations, dyspnea, pnd, orthopnea, n, v, dizziness, syncope, edema, weight gain, or early satiety. All other systems reviewed and are otherwise negative except as noted above.   He has been having some intermittent chest pain.  Studies Reviewed: [name]   EKG Interpretation Date/Time:  Wednesday December 21 2022 14:47:01 EDT Ventricular Rate:  68 PR Interval:  232 QRS Duration:  100 QT Interval:  382 QTC Calculation: 406 R Axis:   34  Text Interpretation: Sinus rhythm with 1st degree A-V block Nonspecific T wave abnormality When compared with ECG of 14-Nov-2022 09:20, No significant change was found Confirmed by Azalee Course [id number]) on 12/23/2022 11:12:44 PM     Risk Assessment/Calculations:          Physical Exam:   VS:  BP 125/77   Pulse 68   Ht 5\' 9"  (1.753 m)   Wt 166 lb (75.3 kg)   SpO2 98%   BMI 24.51 kg/m    Wt Readings from Last 3 Encounters:  12/21/22 166 lb (75.3 kg)  11/14/22 175 lb 3.2 oz (79.5 kg)  07/13/22 181 lb (82.1 kg)    GEN: Well nourished, well developed in no acute distress NECK: No JVD; No carotid bruits CARDIAC: RRR, no murmurs, rubs, gallops RESPIRATORY:  Clear to auscultation without rales, wheezing or rhonchi  ABDOMEN: Soft, non-tender, non-distended EXTREMITIES:  No edema; No deformity   ASSESSMENT AND PLAN: .  Chest discomfort: Symptom is quite atypical, his chest pain is brought on when he lay against anything on his back.  He tested in the office and shortly after he sat down, he started having chest pain.  However chest pain quickly went away after he got up and walked around.  Recent coronary CT showed moderate disease  CAD: Moderate disease on the recent coronary CT.  Given atypical nature of his chest pain, we will hold off on additional workup  Hypertension: Blood pressure stable  History of AVR: Stable on last echocardiogram  Hyperlipidemia: On Vytorin.        Dispo: Follow-up with Dr. Tresa Endo in 3 to 52-month  Signed, Azalee Course, [place]

## 2023-01-12 ENCOUNTER — Encounter: Payer: Self-pay | Admitting: Neurology

## 2023-01-12 ENCOUNTER — Ambulatory Visit: Payer: Medicare HMO | Admitting: Neurology

## 2023-01-12 VITALS — BP 153/90 | HR 73 | Ht 69.5 in | Wt 162.0 lb

## 2023-01-12 DIAGNOSIS — G20A1 Parkinson's disease without dyskinesia, without mention of fluctuations: Secondary | ICD-10-CM

## 2023-01-12 DIAGNOSIS — R4189 Other symptoms and signs involving cognitive functions and awareness: Secondary | ICD-10-CM

## 2023-01-12 MED ORDER — CARBIDOPA-LEVODOPA 25-100 MG PO TABS: 1.0000 | ORAL_TABLET | Freq: Three times a day (TID) | ORAL | 6 refills | Status: DC

## 2023-01-12 NOTE — Progress Notes (Signed)
Chief Complaint  Patient presents with   New Patient (Initial Visit)    Rm 15, wife sharon present, internal referral for shuffling gait, possible PD      ASSESSMENT AND PLAN  Drew Andrews is a 77 y.o. male   Parkinson's disease Cognitive impairment  MRI of the brain without contrast  Laboratory evaluation  Sinemet 25/100 mg 3 times a day  Physical therapy,  Return To Clinic   In 3 Months   DIAGNOSTIC DATA (LABS, IMAGING, TESTING) - I reviewed patient records, labs, notes, testing and imaging myself where available.   MEDICAL HISTORY:  Drew Andrews is a 77 year old male, accompanied his wife, seen in request by cardiologist PA Azalee Course, for evaluation of Parkinson's, primary care physician is Dr. Wylene Simmer, Adelfa Koh, initial evaluation January 12, 2023  I reviewed and summarized the referring note.PMHX S/p Aortic valve replacement in 2015. HTN HLD  Alcohol  He used to be active, over the past couple years, noticed gradually decline, loss sense of smell, wife also reported he talking out of dream, he is a retired Art gallery manager, noticed mild memory loss,  Was noted to have some Parkinson features by primary care physician Dr.Tisovec in 2023, he wants to put it off, worsening symptoms since 2024, could no longer finish his daily walking, loss of appetite with weight loss over past few months, worsening right hand tremor, gait abnormality, he used to be easygoing, now very anxious about his slow decline   PHYSICAL EXAM:   Vitals:   01/12/23 1352  BP: (!) 153/90  Pulse: 73  Weight: 162 lb (73.5 kg)  Height: 5' 9.5" (1.765 m)   Not recorded     Body mass index is 23.58 kg/m.  PHYSICAL EXAMNIATION:  Gen: NAD, conversant, well nourised, well groomed                     Cardiovascular: Regular rate rhythm, no peripheral edema, warm, nontender. Eyes: Conjunctivae clear without exudates or hemorrhage Neck: Supple, no carotid  bruits. Pulmonary: Clear to auscultation bilaterally   NEUROLOGICAL EXAM:  MENTAL STATUS: Speech/cognition: Microphonia, decreased facial expression, masked face    01/12/2023    2:00 PM  Montreal Cognitive Assessment   Visuospatial/ Executive (0/5) 3  Naming (0/3) 3  Attention: Read list of digits (0/2) 2  Attention: Read list of letters (0/1) 1  Attention: Serial 7 subtraction starting at 100 (0/3) 3  Language: Repeat phrase (0/2) 2  Language : Fluency (0/1) 1  Abstraction (0/2) 2  Delayed Recall (0/5) 1  Orientation (0/6) 5  Total 23    CRANIAL NERVES: CN II: Visual fields are full to confrontation. Pupils are round equal and briskly reactive to light. CN III, IV, VI: extraocular movement are normal. No ptosis. CN V: Facial sensation is intact to light touch CN VII: Face is symmetric with normal eye closure  CN VIII: Hearing is normal to causal conversation. CN IX, X: Phonation is normal. CN XI: Head turning and shoulder shrug are intact  MOTOR: Right hand resting tremor, right more than left rigidity, bradykinesia  REFLEXES: Reflexes are 1 and symmetric at the biceps, triceps, knees, and ankles.    SENSORY: Intact to light touch and vibratory sensation are intact in fingers and toes.  COORDINATION: There is no trunk or limb dysmetria noted.  GAIT/STANCE: Push-up to get up from seated position, decreased bilateral arm swing, more on the right side, small stride, leaning forward, en bloc turning  REVIEW OF SYSTEMS:  Full 14 system review of systems performed and notable only for as above All other review of systems were negative.   ALLERGIES: Allergies  Allergen Reactions   Scopolamine     seizure    HOME MEDICATIONS: Current Outpatient Medications  Medication Sig Dispense Refill   amLODipine (NORVASC) 5 MG tablet Take 1 tablet (5 mg total) by mouth daily. 90 tablet 3   amoxicillin (AMOXIL) 500 MG tablet Take 4 tablets 1 hour prior to dental procedure 8  tablet 1   aspirin 81 MG tablet Take 81 mg by mouth daily.     carvedilol (COREG) 6.25 MG tablet Take 1 tablet (6.25 mg total) by mouth 2 (two) times daily. 180 tablet 3   diphenhydrAMINE (BENADRYL) 25 MG tablet Take 25 mg by mouth every 6 (six) hours as needed.     ezetimibe-simvastatin (VYTORIN) 10-40 MG tablet Take 1 tablet by mouth at bedtime. 90 tablet 3   lisinopril (ZESTRIL) 40 MG tablet Take 1 tablet (40 mg total) by mouth daily. 90 tablet 3   omeprazole (PRILOSEC OTC) 20 MG tablet Take 20 mg by mouth daily.     No current facility-administered medications for this visit.    PAST MEDICAL HISTORY: Past Medical History:  Diagnosis Date   Bursitis of elbow 05/20/2013   Essential hypertension 03/12/2013   GERD (gastroesophageal reflux disease)    Hyperlipidemia    Hypertension    Left ventricular diastolic dysfunction, NYHA class 2 03/12/2013   LVH (left ventricular hypertrophy)    with aortic stenosis-bicuspid   PONV (postoperative nausea and vomiting)    as a child   S/P AVR (aortic valve replacement) 05/13/2013   S/P AVR, 05/13/13, 25 mm Edwards Magna-Ease pericardial valve 05/13/2013   Seasonal allergies    Severe aortic stenosis 03/12/2013    PAST SURGICAL HISTORY: Past Surgical History:  Procedure Laterality Date   AORTIC VALVE REPLACEMENT N/A 05/13/2013   Procedure: AORTIC VALVE REPLACEMENT (AVR);  Surgeon: Alleen Borne, MD;  Location[facility] OR;  Service: Open Heart Surgery;  Laterality: N/A;   APPENDECTOMY  age 53   CARDIAC CATHETERIZATION     COLONOSCOPY     X 2   HIATAL HERNIA REPAIR     INTRAOPERATIVE TRANSESOPHAGEAL ECHOCARDIOGRAM N/A 05/13/2013   Procedure: INTRAOPERATIVE TRANSESOPHAGEAL ECHOCARDIOGRAM;  Surgeon: Alleen Borne, MD;  Location: [facility] OR;  Service: Open Heart Surgery;  Laterality: N/A;   LEFT HEART CATHETERIZATION WITH CORONARY ANGIOGRAM N/A 03/15/2013   Procedure: LEFT HEART CATHETERIZATION WITH CORONARY ANGIOGRAM;  Surgeon: Lennette Bihari, MD;   Location: [facility] CATH LAB;  Service: Cardiovascular;  Laterality: N/A;   TONSILLECTOMY     TRANSTHORACIC ECHOCARDIOGRAM  03/04/2013   EF 55-60%, grade 2 diastolic dysfunction, AV with mod calcified leaflets & mild regurg, calcified MV annulus, LA mod dilated, RV mildly dilated    FAMILY HISTORY: Family History  Problem Relation Age of Onset   Hypertension Mother    Hyperlipidemia Mother    Diabetes Mother    Heart attack Father    Acute myelogenous leukemia Brother    Pneumonia Maternal Grandmother    Heart attack Maternal Grandfather    Colon cancer Neg Hx    Stomach cancer Neg Hx     SOCIAL HISTORY: Social History   Socioeconomic History   Marital status: Married    Spouse name: Not on file   Number of children: 1   Years of education: college   Highest education level: Not on  file  Occupational History   Not on file  Tobacco Use   Smoking status: Former    Current packs/day: 0.00    Types: Cigarettes    Quit date: 02/10/1974    Years since quitting: 48.9   Smokeless tobacco: Never  Substance and Sexual Activity   Alcohol use: Yes    Comment: Drinks 1 bottle of wine daily and 1 beer   Drug use: No   Sexual activity: Not on file  Other Topics Concern   Not on file  Social History Narrative   Not on file   Social Determinants of Health   Financial Resource Strain: Not on file  Food Insecurity: Not on file  Transportation Needs: Not on file  Physical Activity: Not on file  Stress: Not on file  Social Connections: Not on file  Intimate Partner Violence: Not on file      Levert Feinstein, M.D. Ph.D.  [facility] [address] [place], [place] [address] Ph: [phone] Fax: [phone]  CC:  Azalee Course, [place] [address] [place],  [place] [address]  Tisovec, Adelfa Koh, MD

## 2023-01-13 LAB — TSH: TSH: 1.03 u[IU]/mL (ref 0.450–4.500)

## 2023-01-13 LAB — VITAMIN B12: Vitamin B-12: 312 pg/mL (ref 232–1245)

## 2023-01-13 LAB — RPR: RPR Ser Ql: NONREACTIVE

## 2023-01-17 ENCOUNTER — Telehealth: Payer: Self-pay | Admitting: Neurology

## 2023-01-17 NOTE — Telephone Encounter (Signed)
sent to [facility] they they obtain [facility] Berkley Harvey [phone]

## 2023-01-23 ENCOUNTER — Other Ambulatory Visit (HOSPITAL_BASED_OUTPATIENT_CLINIC_OR_DEPARTMENT_OTHER): Payer: Self-pay

## 2023-01-23 MED ORDER — INFLUENZA VAC A&B SURF ANT ADJ 0.5 ML IM SUSY
0.5000 mL | PREFILLED_SYRINGE | Freq: Once | INTRAMUSCULAR | 0 refills | Status: AC
  Filled 2023-01-23: qty 0.5, 1d supply, fill #0

## 2023-01-23 MED ORDER — COVID-19 MRNA VAC-TRIS(PFIZER) 30 MCG/0.3ML IM SUSY
0.3000 mL | PREFILLED_SYRINGE | Freq: Once | INTRAMUSCULAR | 0 refills | Status: AC
  Filled 2023-01-23: qty 0.3, 1d supply, fill #0

## 2023-01-24 ENCOUNTER — Ambulatory Visit: Payer: Medicare HMO | Attending: Neurology

## 2023-01-24 DIAGNOSIS — G20A1 Parkinson's disease without dyskinesia, without mention of fluctuations: Secondary | ICD-10-CM | POA: Diagnosis not present

## 2023-01-24 DIAGNOSIS — R2689 Other abnormalities of gait and mobility: Secondary | ICD-10-CM | POA: Diagnosis not present

## 2023-01-24 DIAGNOSIS — R2681 Unsteadiness on feet: Secondary | ICD-10-CM | POA: Diagnosis not present

## 2023-01-24 DIAGNOSIS — R262 Difficulty in walking, not elsewhere classified: Secondary | ICD-10-CM | POA: Diagnosis not present

## 2023-01-24 DIAGNOSIS — R4189 Other symptoms and signs involving cognitive functions and awareness: Secondary | ICD-10-CM | POA: Insufficient documentation

## 2023-01-24 NOTE — Therapy (Signed)
OUTPATIENT PHYSICAL THERAPY NEURO EVALUATION   Patient Name: Drew Andrews MRN: [id number] DOB:1945-10-27, 77 y.o., male Today's Date: 01/24/2023   PCP: Gaspar Garbe, MD REFERRING PROVIDER: Levert Feinstein, MD  END OF SESSION:  PT End of Session - 01/24/23 1109     Visit Number 1    Number of Visits 6    Date for PT Re-Evaluation 03/07/23    Authorization Type [employer]    PT Start Time 1110    PT Stop Time 1155    PT Time Calculation (min) 45 min             Past Medical History:  Diagnosis Date   Bursitis of elbow 05/20/2013   Essential hypertension 03/12/2013   GERD (gastroesophageal reflux disease)    Hyperlipidemia    Hypertension    Left ventricular diastolic dysfunction, NYHA class 2 03/12/2013   LVH (left ventricular hypertrophy)    with aortic stenosis-bicuspid   PONV (postoperative nausea and vomiting)    as a child   S/P AVR (aortic valve replacement) 05/13/2013   S/P AVR, 05/13/13, 25 mm Edwards Magna-Ease pericardial valve 05/13/2013   Seasonal allergies    Severe aortic stenosis 03/12/2013   Past Surgical History:  Procedure Laterality Date   AORTIC VALVE REPLACEMENT N/A 05/13/2013   Procedure: AORTIC VALVE REPLACEMENT (AVR);  Surgeon: Alleen Borne, MD;  Location: [facility] OR;  Service: Open Heart Surgery;  Laterality: N/A;   APPENDECTOMY  age 53   CARDIAC CATHETERIZATION     COLONOSCOPY     X 2   HIATAL HERNIA REPAIR     INTRAOPERATIVE TRANSESOPHAGEAL ECHOCARDIOGRAM N/A 05/13/2013   Procedure: INTRAOPERATIVE TRANSESOPHAGEAL ECHOCARDIOGRAM;  Surgeon: Alleen Borne, MD;  Location: [facility] OR;  Service: Open Heart Surgery;  Laterality: N/A;   LEFT HEART CATHETERIZATION WITH CORONARY ANGIOGRAM N/A 03/15/2013   Procedure: LEFT HEART CATHETERIZATION WITH CORONARY ANGIOGRAM;  Surgeon: Lennette Bihari, MD;  Location: [facility] CATH LAB;  Service: Cardiovascular;  Laterality: N/A;   TONSILLECTOMY     TRANSTHORACIC ECHOCARDIOGRAM  03/04/2013   EF 55-60%,  grade 2 diastolic dysfunction, AV with mod calcified leaflets & mild regurg, calcified MV annulus, LA mod dilated, RV mildly dilated   Patient Active Problem List   Diagnosis Date Noted   Cognitive impairment 01/12/2023   Parkinson's disease without dyskinesia or fluctuating manifestations 01/12/2023   GERD (gastroesophageal reflux disease) 11/15/2015   Diplopia 02/21/2014   S/P AVR (aortic valve replacement) 07/13/2013   Rash of back, contact dermatitis resolved 05/31/2013   Bursitis of elbow 05/20/2013   S/P AVR, 05/13/13, 25 mm Edwards Magna-Ease pericardial valve 05/13/2013   Severe aortic stenosis 03/12/2013   Hyperlipidemia 03/12/2013   Essential hypertension 03/12/2013   Left ventricular diastolic dysfunction, NYHA class 2 03/12/2013    ONSET DATE: 1.5 year ago  REFERRING DIAG: G20.A1 (ICD-10-CM) - Parkinson's disease without dyskinesia or fluctuating manifestations R41.89 (ICD-10-CM) - Cognitive impairment  THERAPY DIAG:  No diagnosis found.  Rationale for Evaluation and Treatment: Rehabilitation  SUBJECTIVE:  SUBJECTIVE STATEMENT: Diagnosed with PD about 1.5 year ago and just recently began following neurology and treatment with carbidopa-levadopa.  Reports one fall prior to starting medication. Notes some sleep dysfunction that has been present and experiencing episodes of chest pain when at rest or reclined and relieved by activity. Notes increasing feeling of fatigue with walking and physical activity, e.g. requires multiple days to cut grass. Notes memory and processing issues and reports some issues or drooling  Pt accompanied by: significant other  PERTINENT HISTORY: aortic valve replacement,   PAIN:  Are you having pain? No  PRECAUTIONS: None  RED FLAGS: None   WEIGHT  BEARING RESTRICTIONS: No  FALLS: Has patient fallen in last 6 months? Yes. Number of falls 1  LIVING ENVIRONMENT: Lives with: lives with their family and lives with their spouse Lives in: House/apartment Stairs: Yes: Internal: flight steps; on right going up and External: 4-5 steps; on right going up Has following equipment at home: None  PLOF: Independent  PATIENT GOALS: "figure out what I can and can't do"  OBJECTIVE:   DIAGNOSTIC FINDINGS: reports MRI has been scheduled  COGNITION: Overall cognitive status:  spouse endorses some mild cognitive impairment   SENSATION: WFL  COORDINATION: Difficulty with rapid alternating movement Heel to shin WNL Finger to nose WNL  EDEMA:  none  MUSCLE TONE: increased hamstring tone/tightness. Slight ratcheting in bilat biceps   MUSCLE LENGTH: Hamstrings: Right -10 deg; Left -10 deg in long sitting   DTRs:  NT  POSTURE: forward head and flexed trunk   LOWER EXTREMITY ROM:     WFL  LOWER EXTREMITY MMT:    BLE grossly 5/5  BED MOBILITY:  indep  TRANSFERS: Assistive device utilized: None  Sit to stand: Complete Independence Stand to sit: Complete Independence Chair to chair: Complete Independence Floor:  NT    CURB:  Level of Assistance: Complete Independence Assistive device utilized: None Curb Comments:   STAIRS: Level of Assistance: Complete Independence Stair Negotiation Technique: Alternating Pattern  with No Rails Number of Stairs: 8  Height of Stairs: 4-6  Comments:   GAIT: Gait pattern:  good stride but no trunk rotation/arm swing maintaining elbows flexed to 90 degrees and rigid to sides Distance walked:  Assistive device utilized: None Level of assistance: Complete Independence Comments:   FUNCTIONAL TESTS:  MINI-BESTest: 20/28  M-CTSIB  Condition 1: Firm Surface, EO 30 Sec, Normal Sway  Condition 2: Firm Surface, EC 30 Sec, Mild Sway  Condition 3: Foam Surface, EO 30 Sec, Mild Sway   Condition 4: Foam Surface, EC 30 Sec, Mild Sway       PATIENT EDUCATION: Education details: assessment details, rationale of PT intervention in regards to PD Person educated: Patient and Spouse Education method: Explanation Education comprehension: verbalized understanding  HOME EXERCISE PROGRAM: --encouraged initial walking program of 15 minutes 3x/wk  GOALS: Goals reviewed with patient? Yes  SHORT TERM GOALS: Target date: same as LTG    LONG TERM GOALS: Target date: 03/07/2023    Patient will be independent in HEP to improve functional outcomes Baseline:  Goal status: INITIAL  2.  Reduce risk for falls per score 25/28 Mini-BESTest Baseline: 20/28 Goal status: INITIAL  3.  Report improved activity tolerance as evidenced by ability to return to typical household chores Baseline: currently unable to mow lawn Goal status: INITIAL  4.  Teach-back relevant community programs for those with PD to improve carryover and consistency of activity Baseline: TBD Goal status: INITIAL    ASSESSMENT:  CLINICAL IMPRESSION: Patient is a 77 y.o. male who was seen today for physical therapy evaluation and treatment for PD.  Exhibits postural dysfunction and balance deficits per outcome measures and exhibits alteration in gait pattern with limited trunk rotation and absent arm swing during ambulation.  Pt notes overall decline in functional activity tolerance unable to complete usual household chores.  Patient would benefit from PT interventions to improve functional independence and reduce risk for falls to improve activity tolerance and participation.   OBJECTIVE IMPAIRMENTS: Abnormal gait, decreased activity tolerance, decreased balance, decreased endurance, decreased knowledge of condition, decreased mobility, difficulty walking, impaired perceived functional ability, impaired flexibility, impaired tone, and postural dysfunction.   ACTIVITY LIMITATIONS: carrying, lifting, reach over  head, and locomotion level  PARTICIPATION LIMITATIONS: cleaning, laundry, community activity, and yard work  PERSONAL FACTORS: Age and Time since onset of injury/illness/exacerbation are also affecting patient's functional outcome.   REHAB POTENTIAL: Excellent  CLINICAL DECISION MAKING: Stable/uncomplicated  EVALUATION COMPLEXITY: Low  PLAN:  PT FREQUENCY: 1x/week  PT DURATION: 6 weeks  PLANNED INTERVENTIONS: Therapeutic exercises, Therapeutic activity, Neuromuscular re-education, Balance training, Gait training, Patient/Family education, Self Care, Joint mobilization, Stair training, Vestibular training, Canalith repositioning, DME instructions, Aquatic Therapy, Dry Needling, Spinal mobilization, Cryotherapy, Moist heat, and Manual therapy  PLAN FOR NEXT SESSION: initiate HEP, arm swing, how did walking routine go? Use of trekking poles?   12:55 PM, 01/24/23 M. Shary Decamp, PT, DPT Physical Therapist- [place] Office Number: [phone]

## 2023-01-31 ENCOUNTER — Ambulatory Visit: Payer: Medicare HMO | Attending: Neurology

## 2023-01-31 DIAGNOSIS — R2681 Unsteadiness on feet: Secondary | ICD-10-CM | POA: Insufficient documentation

## 2023-01-31 DIAGNOSIS — R262 Difficulty in walking, not elsewhere classified: Secondary | ICD-10-CM | POA: Insufficient documentation

## 2023-01-31 DIAGNOSIS — R2689 Other abnormalities of gait and mobility: Secondary | ICD-10-CM | POA: Diagnosis not present

## 2023-01-31 NOTE — Therapy (Signed)
OUTPATIENT PHYSICAL THERAPY NEURO TREATMENT   Patient Name: Drew Andrews MRN: [id number] DOB:21-Feb-1946, 77 y.o., male Today's Date: 01/31/2023   PCP: Gaspar Garbe, MD REFERRING PROVIDER: Levert Feinstein, MD  END OF SESSION:  PT End of Session - 01/31/23 1055     Visit Number 2    Number of Visits 6    Date for PT Re-Evaluation 03/07/23    Authorization Type [employer] Medicare    PT Start Time 1100    PT Stop Time 1145    PT Time Calculation (min) 45 min             Past Medical History:  Diagnosis Date   Bursitis of elbow 05/20/2013   Essential hypertension 03/12/2013   GERD (gastroesophageal reflux disease)    Hyperlipidemia    Hypertension    Left ventricular diastolic dysfunction, NYHA class 2 03/12/2013   LVH (left ventricular hypertrophy)    with aortic stenosis-bicuspid   PONV (postoperative nausea and vomiting)    as a child   S/P AVR (aortic valve replacement) 05/13/2013   S/P AVR, 05/13/13, 25 mm Edwards Magna-Ease pericardial valve 05/13/2013   Seasonal allergies    Severe aortic stenosis 03/12/2013   Past Surgical History:  Procedure Laterality Date   AORTIC VALVE REPLACEMENT N/A 05/13/2013   Procedure: AORTIC VALVE REPLACEMENT (AVR);  Surgeon: Alleen Borne, MD;  Location[facility] OR;  Service: Open Heart Surgery;  Laterality: N/A;   APPENDECTOMY  age 65   CARDIAC CATHETERIZATION     COLONOSCOPY     X 2   HIATAL HERNIA REPAIR     INTRAOPERATIVE TRANSESOPHAGEAL ECHOCARDIOGRAM N/A 05/13/2013   Procedure: INTRAOPERATIVE TRANSESOPHAGEAL ECHOCARDIOGRAM;  Surgeon: Alleen Borne, MD;  Location: [facility] OR;  Service: Open Heart Surgery;  Laterality: N/A;   LEFT HEART CATHETERIZATION WITH CORONARY ANGIOGRAM N/A 03/15/2013   Procedure: LEFT HEART CATHETERIZATION WITH CORONARY ANGIOGRAM;  Surgeon: Lennette Bihari, MD;  Location: [facility] CATH LAB;  Service: Cardiovascular;  Laterality: N/A;   TONSILLECTOMY     TRANSTHORACIC ECHOCARDIOGRAM  03/04/2013   EF 55-60%, grade  2 diastolic dysfunction, AV with mod calcified leaflets & mild regurg, calcified MV annulus, LA mod dilated, RV mildly dilated   Patient Active Problem List   Diagnosis Date Noted   Cognitive impairment 01/12/2023   Parkinson's disease without dyskinesia or fluctuating manifestations (HCC) 01/12/2023   GERD (gastroesophageal reflux disease) 11/15/2015   Diplopia 02/21/2014   S/P AVR (aortic valve replacement) 07/13/2013   Rash of back, contact dermatitis resolved 05/31/2013   Bursitis of elbow 05/20/2013   S/P AVR, 05/13/13, 25 mm Edwards Magna-Ease pericardial valve 05/13/2013   Severe aortic stenosis 03/12/2013   Hyperlipidemia 03/12/2013   Essential hypertension 03/12/2013   Left ventricular diastolic dysfunction, NYHA class 2 03/12/2013    ONSET DATE: 1.5 year ago  REFERRING DIAG: G20.A1 (ICD-10-CM) - Parkinson's disease without dyskinesia or fluctuating manifestations R41.89 (ICD-10-CM) - Cognitive impairment  THERAPY DIAG:  Other abnormalities of gait and mobility  Unsteadiness on feet  Difficulty in walking, not elsewhere classified  Rationale for Evaluation and Treatment: Rehabilitation  SUBJECTIVE:  SUBJECTIVE STATEMENT: Signed up for SilverSneakers, used the NU-step and wore myself out!  Pt accompanied by: significant other  PERTINENT HISTORY: aortic valve replacement,   PAIN:  Are you having pain? No  PRECAUTIONS: None  RED FLAGS: None   WEIGHT BEARING RESTRICTIONS: No  FALLS: Has patient fallen in last 6 months? Yes. Number of falls 1  LIVING ENVIRONMENT: Lives with: lives with their family and lives with their spouse Lives in: House/apartment Stairs: Yes: Internal: flight steps; on right going up and External: 4-5 steps; on right going up Has following equipment at  home: None  PLOF: Independent  PATIENT GOALS: "figure out what I can and can't do"  OBJECTIVE:   TODAY'S TREATMENT: 01/31/23 Activity Comments  Pt education regarding HIIT   NU-step speed intervals x 6 min 2 min warm-up 30 sec  speed (110+ SPM) 30 sec slow 80-90 SPM 1 min cool down  Gait training -use of trekking poles over level and outdoor surfaces to promote arm swing -techniques to facilitate large amplitude arm swing  Seated PWR moves 1x10 guided rehearsal. Instruction on internet resources for the same   Gastroc stretch 1x60 sec         DIAGNOSTIC FINDINGS: reports MRI has been scheduled  COGNITION: Overall cognitive status:  spouse endorses some mild cognitive impairment   SENSATION: WFL  COORDINATION: Difficulty with rapid alternating movement Heel to shin WNL Finger to nose WNL  EDEMA:  none  MUSCLE TONE: increased hamstring tone/tightness. Slight ratcheting in bilat biceps   MUSCLE LENGTH: Hamstrings: Right -10 deg; Left -10 deg in long sitting   DTRs:  NT  POSTURE: forward head and flexed trunk   LOWER EXTREMITY ROM:     WFL  LOWER EXTREMITY MMT:    BLE grossly 5/5  BED MOBILITY:  indep  TRANSFERS: Assistive device utilized: None  Sit to stand: Complete Independence Stand to sit: Complete Independence Chair to chair: Complete Independence Floor:  NT    CURB:  Level of Assistance: Complete Independence Assistive device utilized: None Curb Comments:   STAIRS: Level of Assistance: Complete Independence Stair Negotiation Technique: Alternating Pattern  with No Rails Number of Stairs: 8  Height of Stairs: 4-6  Comments:   GAIT: Gait pattern:  good stride but no trunk rotation/arm swing maintaining elbows flexed to 90 degrees and rigid to sides Distance walked:  Assistive device utilized: None Level of assistance: Complete Independence Comments:   FUNCTIONAL TESTS:  MINI-BESTest: 20/28  M-CTSIB  Condition 1: Firm Surface,  EO 30 Sec, Normal Sway  Condition 2: Firm Surface, EC 30 Sec, Mild Sway  Condition 3: Foam Surface, EO 30 Sec, Mild Sway  Condition 4: Foam Surface, EC 30 Sec, Mild Sway       PATIENT EDUCATION: Education details: assessment details, rationale of PT intervention in regards to PD Person educated: Patient and Spouse Education method: Explanation Education comprehension: verbalized understanding  HOME EXERCISE PROGRAM: --encouraged initial walking program of 15 minutes 3x/wk Access Code: [email or web address] URL: [email or web address] Date: 01/31/2023 Prepared by: Shary Decamp  Program Notes provided seated PWR moves info  Exercises - Standing Gastroc Stretch  - 1 x daily - 7 x weekly - 3 sets - 60 sec hold  GOALS: Goals reviewed with patient? Yes  SHORT TERM GOALS: Target date: same as LTG    LONG TERM GOALS: Target date: 03/07/2023    Patient will be independent in HEP to improve functional outcomes Baseline:  Goal status: IN PROGRESS  2.  Reduce risk  for falls per score 25/28 Mini-BESTest Baseline: 20/28 Goal status: INITIAL  3.  Report improved activity tolerance as evidenced by ability to return to typical household chores Baseline: currently unable to mow lawn Goal status: IN PROGRESS  4.  Teach-back relevant community programs for those with PD to improve carryover and consistency of activity Baseline: TBD Goal status: IN PROGRESS    ASSESSMENT:  CLINICAL IMPRESSION: Pt reports he has initiated Silversneakers benefit with local [employer] and was intent on using cardio machines. Instructed in benefits and techniques of HIIT for cardiovasular/neuro training benefit and verbalizes understanding. Instructed in gait training techniques to improve reciprocal arm motion and amplitude to improve mobility and fluidity of gait for greater energy efficiency with excellent carryover using trekking poles with good sequencing maintained.  Initial instruction in seated  large amplitude movement exercises to improve coordination, flexibility, and motor control requiring slow pace with heavy visual instruction with improved carryover towards end of reps. Calf stretching to improve LE flexibility and improve DF ROM for gait cycle. Continued sessions to progress POC details.   OBJECTIVE IMPAIRMENTS: Abnormal gait, decreased activity tolerance, decreased balance, decreased endurance, decreased knowledge of condition, decreased mobility, difficulty walking, impaired perceived functional ability, impaired flexibility, impaired tone, and postural dysfunction.   ACTIVITY LIMITATIONS: carrying, lifting, reach over head, and locomotion level  PARTICIPATION LIMITATIONS: cleaning, laundry, community activity, and yard work  PERSONAL FACTORS: Age and Time since onset of injury/illness/exacerbation are also affecting patient's functional outcome.   REHAB POTENTIAL: Excellent  CLINICAL DECISION MAKING: Stable/uncomplicated  EVALUATION COMPLEXITY: Low  PLAN:  PT FREQUENCY: 1x/week  PT DURATION: 6 weeks  PLANNED INTERVENTIONS: Therapeutic exercises, Therapeutic activity, Neuromuscular re-education, Balance training, Gait training, Patient/Family education, Self Care, Joint mobilization, Stair training, Vestibular training, Canalith repositioning, DME instructions, Aquatic Therapy, Dry Needling, Spinal mobilization, Cryotherapy, Moist heat, and Manual therapy  PLAN FOR NEXT SESSION: progress HEP, arm swing, how did HIIT at [facility] go?   10:55 AM, 01/31/23 M. Shary Decamp, PT, DPT Physical Therapist- [place] Office Number: [phone]

## 2023-02-07 ENCOUNTER — Ambulatory Visit: Payer: Medicare HMO

## 2023-02-07 DIAGNOSIS — R2689 Other abnormalities of gait and mobility: Secondary | ICD-10-CM | POA: Diagnosis not present

## 2023-02-07 DIAGNOSIS — R262 Difficulty in walking, not elsewhere classified: Secondary | ICD-10-CM | POA: Diagnosis not present

## 2023-02-07 DIAGNOSIS — R2681 Unsteadiness on feet: Secondary | ICD-10-CM

## 2023-02-07 NOTE — Therapy (Signed)
OUTPATIENT PHYSICAL THERAPY NEURO TREATMENT   Patient Name: Drew Andrews MRN: [id number] DOB:Nov 05, 1945, 77 y.o., male Today's Date: 02/07/2023   PCP: Gaspar Garbe, MD REFERRING PROVIDER: Levert Feinstein, MD  END OF SESSION:  PT End of Session - 02/07/23 1013     Visit Number 3    Number of Visits 6    Date for PT Re-Evaluation 03/07/23    Authorization Type [employer]    PT Start Time 1015    PT Stop Time 1100    PT Time Calculation (min) 45 min             Past Medical History:  Diagnosis Date   Bursitis of elbow 05/20/2013   Essential hypertension 03/12/2013   GERD (gastroesophageal reflux disease)    Hyperlipidemia    Hypertension    Left ventricular diastolic dysfunction, NYHA class 2 03/12/2013   LVH (left ventricular hypertrophy)    with aortic stenosis-bicuspid   PONV (postoperative nausea and vomiting)    as a child   S/P AVR (aortic valve replacement) 05/13/2013   S/P AVR, 05/13/13, 25 mm Edwards Magna-Ease pericardial valve 05/13/2013   Seasonal allergies    Severe aortic stenosis 03/12/2013   Past Surgical History:  Procedure Laterality Date   AORTIC VALVE REPLACEMENT N/A 05/13/2013   Procedure: AORTIC VALVE REPLACEMENT (AVR);  Surgeon: Alleen Borne, MD;  Location: [facility] OR;  Service: Open Heart Surgery;  Laterality: N/A;   APPENDECTOMY  age 61   CARDIAC CATHETERIZATION     COLONOSCOPY     X 2   HIATAL HERNIA REPAIR     INTRAOPERATIVE TRANSESOPHAGEAL ECHOCARDIOGRAM N/A 05/13/2013   Procedure: INTRAOPERATIVE TRANSESOPHAGEAL ECHOCARDIOGRAM;  Surgeon: Alleen Borne, MD;  Location: [facility] OR;  Service: Open Heart Surgery;  Laterality: N/A;   LEFT HEART CATHETERIZATION WITH CORONARY ANGIOGRAM N/A 03/15/2013   Procedure: LEFT HEART CATHETERIZATION WITH CORONARY ANGIOGRAM;  Surgeon: Lennette Bihari, MD;  Location: [facility] CATH LAB;  Service: Cardiovascular;  Laterality: N/A;   TONSILLECTOMY     TRANSTHORACIC ECHOCARDIOGRAM  03/04/2013   EF 55-60%, grade  2 diastolic dysfunction, AV with mod calcified leaflets & mild regurg, calcified MV annulus, LA mod dilated, RV mildly dilated   Patient Active Problem List   Diagnosis Date Noted   Cognitive impairment 01/12/2023   Parkinson's disease without dyskinesia or fluctuating manifestations (HCC) 01/12/2023   GERD (gastroesophageal reflux disease) 11/15/2015   Diplopia 02/21/2014   S/P AVR (aortic valve replacement) 07/13/2013   Rash of back, contact dermatitis resolved 05/31/2013   Bursitis of elbow 05/20/2013   S/P AVR, 05/13/13, 25 mm Edwards Magna-Ease pericardial valve 05/13/2013   Severe aortic stenosis 03/12/2013   Hyperlipidemia 03/12/2013   Essential hypertension 03/12/2013   Left ventricular diastolic dysfunction, NYHA class 2 03/12/2013    ONSET DATE: 1.5 year ago  REFERRING DIAG: G20.A1 (ICD-10-CM) - Parkinson's disease without dyskinesia or fluctuating manifestations R41.89 (ICD-10-CM) - Cognitive impairment  THERAPY DIAG:  Other abnormalities of gait and mobility  Unsteadiness on feet  Difficulty in walking, not elsewhere classified  Rationale for Evaluation and Treatment: Rehabilitation  SUBJECTIVE:  SUBJECTIVE STATEMENT: Going to [facility], went to PD cycling class  Pt accompanied by: significant other  PERTINENT HISTORY: aortic valve replacement,   PAIN:  Are you having pain? No  PRECAUTIONS: None  RED FLAGS: None   WEIGHT BEARING RESTRICTIONS: No  FALLS: Has patient fallen in last 6 months? Yes. Number of falls 1  LIVING ENVIRONMENT: Lives with: lives with their family and lives with their spouse Lives in: House/apartment Stairs: Yes: Internal: flight steps; on right going up and External: 4-5 steps; on right going up Has following equipment at home: None  PLOF:  Independent  PATIENT GOALS: "figure out what I can and can't do"  OBJECTIVE:   TODAY'S TREATMENT: 02/07/23 Activity Comments  Seated PWR! moves -slow step-by-step rehearsal followed by 1x10 reps  Standing PWR moves Video review from PWR then 1x10 slow rehearsal 1x5 reps for teach-back using worksheet  Standing balance on foam -sit-stand 2x5 eyes closed -EO/EC x 30 sec -head turns EO/EC  Gastroc stretch 1x60 sec           TODAY'S TREATMENT: 01/31/23 Activity Comments  Pt education regarding HIIT   NU-step speed intervals x 6 min 2 min warm-up 30 sec  speed (110+ SPM) 30 sec slow 80-90 SPM 1 min cool down  Gait training -use of trekking poles over level and outdoor surfaces to promote arm swing -techniques to facilitate large amplitude arm swing  Seated PWR moves 1x10 guided rehearsal. Instruction on internet resources for the same   Gastroc stretch 1x60 sec         DIAGNOSTIC FINDINGS: reports MRI has been scheduled  COGNITION: Overall cognitive status:  spouse endorses some mild cognitive impairment   SENSATION: WFL  COORDINATION: Difficulty with rapid alternating movement Heel to shin WNL Finger to nose WNL  EDEMA:  none  MUSCLE TONE: increased hamstring tone/tightness. Slight ratcheting in bilat biceps   MUSCLE LENGTH: Hamstrings: Right -10 deg; Left -10 deg in long sitting   DTRs:  NT  POSTURE: forward head and flexed trunk   LOWER EXTREMITY ROM:     WFL  LOWER EXTREMITY MMT:    BLE grossly 5/5  BED MOBILITY:  indep  TRANSFERS: Assistive device utilized: None  Sit to stand: Complete Independence Stand to sit: Complete Independence Chair to chair: Complete Independence Floor:  NT    CURB:  Level of Assistance: Complete Independence Assistive device utilized: None Curb Comments:   STAIRS: Level of Assistance: Complete Independence Stair Negotiation Technique: Alternating Pattern  with No Rails Number of Stairs: 8  Height of  Stairs: 4-6  Comments:   GAIT: Gait pattern:  good stride but no trunk rotation/arm swing maintaining elbows flexed to 90 degrees and rigid to sides Distance walked:  Assistive device utilized: None Level of assistance: Complete Independence Comments:   FUNCTIONAL TESTS:  MINI-BESTest: 20/28  M-CTSIB  Condition 1: Firm Surface, EO 30 Sec, Normal Sway  Condition 2: Firm Surface, EC 30 Sec, Mild Sway  Condition 3: Foam Surface, EO 30 Sec, Mild Sway  Condition 4: Foam Surface, EC 30 Sec, Mild Sway       PATIENT EDUCATION: Education details: assessment details, rationale of PT intervention in regards to PD Person educated: Patient and Spouse Education method: Explanation Education comprehension: verbalized understanding  HOME EXERCISE PROGRAM: --encouraged initial walking program of 15 minutes 3x/wk Access Code: [email or web address] URL: [email or web address] Date: 01/31/2023 Prepared by: Shary Decamp  Program Notes provided seated/standing PWR moves info  Exercises - Standing Gastroc Stretch  - 1  x daily - 7 x weekly - 3 sets - 60 sec hold  GOALS: Goals reviewed with patient? Yes  SHORT TERM GOALS: Target date: same as LTG    LONG TERM GOALS: Target date: 03/07/2023    Patient will be independent in HEP to improve functional outcomes Baseline:  Goal status: IN PROGRESS  2.  Reduce risk for falls per score 25/28 Mini-BESTest Baseline: 20/28 Goal status: INITIAL  3.  Report improved activity tolerance as evidenced by ability to return to typical household chores Baseline: currently unable to mow lawn Goal status: IN PROGRESS  4.  Teach-back relevant community programs for those with PD to improve carryover and consistency of activity Baseline: TBD Goal status: IN PROGRESS    ASSESSMENT:  CLINICAL IMPRESSION: Pt reports he has questions regarding PWR moves in seated. Rehearsal with pt and use of worksheet for reference with improved carryover after  slow, guided rehearsal.  Progressed to standing PWR moves for large amplitude mobility and coordination with guided video reference and then slow rehearsal mirroring each other followed by 1x5 reps for pt teach-back requiring supervision for verbal cues for nuanced aspects such as foot position, arm placement, etc.  Standing balance activities on foam and challenges for multi-sensory aspect to improve postural stability.  Good activity tolerance to session without undue fatigue.  Discussed intensity of cycling class and encouraged him to speak to instructor regarding modifications and/or to self-select a lesser intensity and use of perceived exertion 0-10 to scale activities as well, verbalized understanding. Continued sessions to progress POC details.   OBJECTIVE IMPAIRMENTS: Abnormal gait, decreased activity tolerance, decreased balance, decreased endurance, decreased knowledge of condition, decreased mobility, difficulty walking, impaired perceived functional ability, impaired flexibility, impaired tone, and postural dysfunction.   ACTIVITY LIMITATIONS: carrying, lifting, reach over head, and locomotion level  PARTICIPATION LIMITATIONS: cleaning, laundry, community activity, and yard work  PERSONAL FACTORS: Age and Time since onset of injury/illness/exacerbation are also affecting patient's functional outcome.   REHAB POTENTIAL: Excellent  CLINICAL DECISION MAKING: Stable/uncomplicated  EVALUATION COMPLEXITY: Low  PLAN:  PT FREQUENCY: 1x/week  PT DURATION: 6 weeks  PLANNED INTERVENTIONS: Therapeutic exercises, Therapeutic activity, Neuromuscular re-education, Balance training, Gait training, Patient/Family education, Self Care, Joint mobilization, Stair training, Vestibular training, Canalith repositioning, DME instructions, Aquatic Therapy, Dry Needling, Spinal mobilization, Cryotherapy, Moist heat, and Manual therapy  PLAN FOR NEXT SESSION: progress HEP, arm swing, how did cycling class  go? Corner balance-multisensory activities   10:13 AM, 02/07/23 M. Shary Decamp, PT, DPT Physical Therapist- [place] Office Number: [phone]

## 2023-02-08 DIAGNOSIS — R82998 Other abnormal findings in urine: Secondary | ICD-10-CM | POA: Diagnosis not present

## 2023-02-08 DIAGNOSIS — Z0000 Encounter for general adult medical examination without abnormal findings: Secondary | ICD-10-CM | POA: Diagnosis not present

## 2023-02-08 DIAGNOSIS — E663 Overweight: Secondary | ICD-10-CM | POA: Diagnosis not present

## 2023-02-08 DIAGNOSIS — N401 Enlarged prostate with lower urinary tract symptoms: Secondary | ICD-10-CM | POA: Diagnosis not present

## 2023-02-08 DIAGNOSIS — G20A1 Parkinson's disease without dyskinesia, without mention of fluctuations: Secondary | ICD-10-CM | POA: Diagnosis not present

## 2023-02-08 DIAGNOSIS — I350 Nonrheumatic aortic (valve) stenosis: Secondary | ICD-10-CM | POA: Diagnosis not present

## 2023-02-08 DIAGNOSIS — Z952 Presence of prosthetic heart valve: Secondary | ICD-10-CM | POA: Diagnosis not present

## 2023-02-08 DIAGNOSIS — R351 Nocturia: Secondary | ICD-10-CM | POA: Diagnosis not present

## 2023-02-08 DIAGNOSIS — N1831 Chronic kidney disease, stage 3a: Secondary | ICD-10-CM | POA: Diagnosis not present

## 2023-02-08 DIAGNOSIS — I517 Cardiomegaly: Secondary | ICD-10-CM | POA: Diagnosis not present

## 2023-02-08 DIAGNOSIS — I130 Hypertensive heart and chronic kidney disease with heart failure and stage 1 through stage 4 chronic kidney disease, or unspecified chronic kidney disease: Secondary | ICD-10-CM | POA: Diagnosis not present

## 2023-02-08 DIAGNOSIS — I5032 Chronic diastolic (congestive) heart failure: Secondary | ICD-10-CM | POA: Diagnosis not present

## 2023-02-08 DIAGNOSIS — E7800 Pure hypercholesterolemia, unspecified: Secondary | ICD-10-CM | POA: Diagnosis not present

## 2023-02-08 DIAGNOSIS — Z23 Encounter for immunization: Secondary | ICD-10-CM | POA: Diagnosis not present

## 2023-02-08 DIAGNOSIS — Z1339 Encounter for screening examination for other mental health and behavioral disorders: Secondary | ICD-10-CM | POA: Diagnosis not present

## 2023-02-08 DIAGNOSIS — Z1331 Encounter for screening for depression: Secondary | ICD-10-CM | POA: Diagnosis not present

## 2023-02-10 ENCOUNTER — Encounter: Payer: Self-pay | Admitting: Neurology

## 2023-02-14 ENCOUNTER — Ambulatory Visit: Payer: Medicare HMO

## 2023-02-14 DIAGNOSIS — R2681 Unsteadiness on feet: Secondary | ICD-10-CM

## 2023-02-14 DIAGNOSIS — R2689 Other abnormalities of gait and mobility: Secondary | ICD-10-CM | POA: Diagnosis not present

## 2023-02-14 DIAGNOSIS — R262 Difficulty in walking, not elsewhere classified: Secondary | ICD-10-CM | POA: Diagnosis not present

## 2023-02-14 NOTE — Therapy (Signed)
OUTPATIENT PHYSICAL THERAPY NEURO TREATMENT   Patient Name: Drew Andrews MRN: [id number] DOB:Sep 14, 1945, 77 y.o., male Today's Date: 02/14/2023   PCP: Gaspar Garbe, MD REFERRING PROVIDER: Levert Feinstein, MD  END OF SESSION:  PT End of Session - 02/14/23 1012     Visit Number 4    Number of Visits 6    Date for PT Re-Evaluation 03/07/23    Authorization Type [employer] Medicare    PT Start Time 1015    PT Stop Time 1100    PT Time Calculation (min) 45 min             Past Medical History:  Diagnosis Date   Bursitis of elbow 05/20/2013   Essential hypertension 03/12/2013   GERD (gastroesophageal reflux disease)    Hyperlipidemia    Hypertension    Left ventricular diastolic dysfunction, NYHA class 2 03/12/2013   LVH (left ventricular hypertrophy)    with aortic stenosis-bicuspid   PONV (postoperative nausea and vomiting)    as a child   S/P AVR (aortic valve replacement) 05/13/2013   S/P AVR, 05/13/13, 25 mm Edwards Magna-Ease pericardial valve 05/13/2013   Seasonal allergies    Severe aortic stenosis 03/12/2013   Past Surgical History:  Procedure Laterality Date   AORTIC VALVE REPLACEMENT N/A 05/13/2013   Procedure: AORTIC VALVE REPLACEMENT (AVR);  Surgeon: Alleen Borne, MD;  Location: [facility] OR;  Service: Open Heart Surgery;  Laterality: N/A;   APPENDECTOMY  age 89   CARDIAC CATHETERIZATION     COLONOSCOPY     X 2   HIATAL HERNIA REPAIR     INTRAOPERATIVE TRANSESOPHAGEAL ECHOCARDIOGRAM N/A 05/13/2013   Procedure: INTRAOPERATIVE TRANSESOPHAGEAL ECHOCARDIOGRAM;  Surgeon: Alleen Borne, MD;  Location: [facility] OR;  Service: Open Heart Surgery;  Laterality: N/A;   LEFT HEART CATHETERIZATION WITH CORONARY ANGIOGRAM N/A 03/15/2013   Procedure: LEFT HEART CATHETERIZATION WITH CORONARY ANGIOGRAM;  Surgeon: Lennette Bihari, MD;  Location: [facility] CATH LAB;  Service: Cardiovascular;  Laterality: N/A;   TONSILLECTOMY     TRANSTHORACIC ECHOCARDIOGRAM  03/04/2013   EF 55-60%,  grade 2 diastolic dysfunction, AV with mod calcified leaflets & mild regurg, calcified MV annulus, LA mod dilated, RV mildly dilated   Patient Active Problem List   Diagnosis Date Noted   Cognitive impairment 01/12/2023   Parkinson's disease without dyskinesia or fluctuating manifestations (HCC) 01/12/2023   GERD (gastroesophageal reflux disease) 11/15/2015   Diplopia 02/21/2014   S/P AVR (aortic valve replacement) 07/13/2013   Rash of back, contact dermatitis resolved 05/31/2013   Bursitis of elbow 05/20/2013   S/P AVR, 05/13/13, 25 mm Edwards Magna-Ease pericardial valve 05/13/2013   Severe aortic stenosis 03/12/2013   Hyperlipidemia 03/12/2013   Essential hypertension 03/12/2013   Left ventricular diastolic dysfunction, NYHA class 2 03/12/2013    ONSET DATE: 1.5 year ago  REFERRING DIAG: G20.A1 (ICD-10-CM) - Parkinson's disease without dyskinesia or fluctuating manifestations R41.89 (ICD-10-CM) - Cognitive impairment  THERAPY DIAG:  Other abnormalities of gait and mobility  Unsteadiness on feet  Difficulty in walking, not elsewhere classified  Rationale for Evaluation and Treatment: Rehabilitation  SUBJECTIVE:  SUBJECTIVE STATEMENT: Going to [facility], went to PD cycling class  Pt accompanied by: significant other  PERTINENT HISTORY: aortic valve replacement,   PAIN:  Are you having pain? No  PRECAUTIONS: None  RED FLAGS: None   WEIGHT BEARING RESTRICTIONS: No  FALLS: Has patient fallen in last 6 months? Yes. Number of falls 1  LIVING ENVIRONMENT: Lives with: lives with their family and lives with their spouse Lives in: House/apartment Stairs: Yes: Internal: flight steps; on right going up and External: 4-5 steps; on right going up Has following equipment at home: None  PLOF:  Independent  PATIENT GOALS: "figure out what I can and can't do"  OBJECTIVE:   TODAY'S TREATMENT: 02/14/23 Activity Comments  Seated PWR! moves 1x10 slow rehearsal, 1x5 reps for teach-back  Standing PWR! moves 1x10 slow rehearsal, 1x5 reps for teach-back  Corner balance See HEP  Gastroc stretch At counter 3x60 sec  Seated hamstring stretch Mat table 3x60 sec           PATIENT EDUCATION: Education details: assessment details, rationale of PT intervention in regards to PD Person educated: Patient and Spouse Education method: Explanation Education comprehension: verbalized understanding  HOME EXERCISE PROGRAM: --encouraged initial walking program of 15 minutes 3x/wk --Seated and Standing PWR! Moves  Access Code: [email or web address] URL: [email or web address] Date: 01/31/2023 Prepared by: Shary Decamp  Program Notes provided seated/standing PWR moves info  Exercises - Standing Gastroc Stretch  - 1 x daily - 7 x weekly - 3 sets - 60 sec hold - Corner Balance Feet Together With Eyes Open  - 1 x daily - 7 x weekly - 3 sets - 30 sec hold - Corner Balance Feet Together With Eyes Closed  - 1 x daily - 7 x weekly - 3 sets - 30 sec hold - Corner Balance Feet Together: Eyes Open With Head Turns  - 1 x daily - 7 x weekly - 3 sets - 3 reps - Corner Balance Feet Together: Eyes Closed With Head Turns  - 1 x daily - 7 x weekly - 3 sets - 3 reps - Semi-Tandem Corner Balance With Eyes Open  - 1 x daily - 7 x weekly - 3 sets - 15-30 sec hold - Seated Hamstring Stretch  - 1 x daily - 7 x weekly - 1-3 sets - 30-60 sec hold   DIAGNOSTIC FINDINGS: reports MRI has been scheduled  COGNITION: Overall cognitive status:  spouse endorses some mild cognitive impairment   SENSATION: WFL  COORDINATION: Difficulty with rapid alternating movement Heel to shin WNL Finger to nose WNL  EDEMA:  none  MUSCLE TONE: increased hamstring tone/tightness. Slight ratcheting in bilat biceps    MUSCLE LENGTH: Hamstrings: Right -10 deg; Left -10 deg in long sitting   DTRs:  NT  POSTURE: forward head and flexed trunk   LOWER EXTREMITY ROM:     WFL  LOWER EXTREMITY MMT:    BLE grossly 5/5  BED MOBILITY:  indep  TRANSFERS: Assistive device utilized: None  Sit to stand: Complete Independence Stand to sit: Complete Independence Chair to chair: Complete Independence Floor:  NT    CURB:  Level of Assistance: Complete Independence Assistive device utilized: None Curb Comments:   STAIRS: Level of Assistance: Complete Independence Stair Negotiation Technique: Alternating Pattern  with No Rails Number of Stairs: 8  Height of Stairs: 4-6  Comments:   GAIT: Gait pattern:  good stride but no trunk rotation/arm swing maintaining elbows flexed to 90 degrees and rigid to sides Distance  walked:  Assistive device utilized: None Level of assistance: Complete Independence Comments:   FUNCTIONAL TESTS:  MINI-BESTest: 20/28  M-CTSIB  Condition 1: Firm Surface, EO 30 Sec, Normal Sway  Condition 2: Firm Surface, EC 30 Sec, Mild Sway  Condition 3: Foam Surface, EO 30 Sec, Mild Sway  Condition 4: Foam Surface, EC 30 Sec, Mild Sway         GOALS: Goals reviewed with patient? Yes  SHORT TERM GOALS: Target date: same as LTG    LONG TERM GOALS: Target date: 03/07/2023    Patient will be independent in HEP to improve functional outcomes Baseline:  Goal status: IN PROGRESS  2.  Reduce risk for falls per score 25/28 Mini-BESTest Baseline: 20/28 Goal status: INITIAL  3.  Report improved activity tolerance as evidenced by ability to return to typical household chores Baseline: currently unable to mow lawn Goal status: IN PROGRESS  4.  Teach-back relevant community programs for those with PD to improve carryover and consistency of activity Baseline: TBD Goal status: IN PROGRESS    ASSESSMENT:  CLINICAL IMPRESSION: Improving carryover for  seated/standing large amplitude movements requiring slow, guided rehearsal and addition of notes for details with ability to perform teach-back with tapered feedback of therapist.  Addition of corner balance activities to improve postural stability and exposure to multi-sensory balance demands with good stability throughout other than inability to perform tandem stance, requiring semi-tandem position.  Good recall to gastroc stretching with brief cues for optimal position and addition of seated hamstring stretching to improve flexibility and reduce rigidity. Continued sessions to progress POC details to improve carryover of perfomance to home environment  OBJECTIVE IMPAIRMENTS: Abnormal gait, decreased activity tolerance, decreased balance, decreased endurance, decreased knowledge of condition, decreased mobility, difficulty walking, impaired perceived functional ability, impaired flexibility, impaired tone, and postural dysfunction.   ACTIVITY LIMITATIONS: carrying, lifting, reach over head, and locomotion level  PARTICIPATION LIMITATIONS: cleaning, laundry, community activity, and yard work  PERSONAL FACTORS: Age and Time since onset of injury/illness/exacerbation are also affecting patient's functional outcome.   REHAB POTENTIAL: Excellent  CLINICAL DECISION MAKING: Stable/uncomplicated  EVALUATION COMPLEXITY: Low  PLAN:  PT FREQUENCY: 1x/week  PT DURATION: 6 weeks  PLANNED INTERVENTIONS: Therapeutic exercises, Therapeutic activity, Neuromuscular re-education, Balance training, Gait training, Patient/Family education, Self Care, Joint mobilization, Stair training, Vestibular training, Canalith repositioning, DME instructions, Aquatic Therapy, Dry Needling, Spinal mobilization, Cryotherapy, Moist heat, and Manual therapy  PLAN FOR NEXT SESSION: PWR moves review, add supine PWR!,  corner balance review, PWR moves class info   10:13 AM, 02/14/23 M. Shary Decamp, PT, DPT Physical  Therapist- [place] Office Number: [phone]

## 2023-02-16 ENCOUNTER — Ambulatory Visit
Admission: RE | Admit: 2023-02-16 | Discharge: 2023-02-16 | Disposition: A | Discharge status: Home / Self Care | Payer: Medicare HMO | Source: Ambulatory Visit | Attending: Neurology | Admitting: Neurology

## 2023-02-16 DIAGNOSIS — R4189 Other symptoms and signs involving cognitive functions and awareness: Secondary | ICD-10-CM

## 2023-02-16 DIAGNOSIS — G20A1 Parkinson's disease without dyskinesia, without mention of fluctuations: Secondary | ICD-10-CM | POA: Diagnosis not present

## 2023-02-21 ENCOUNTER — Ambulatory Visit: Payer: Medicare HMO

## 2023-02-21 DIAGNOSIS — R2681 Unsteadiness on feet: Secondary | ICD-10-CM | POA: Diagnosis not present

## 2023-02-21 DIAGNOSIS — R262 Difficulty in walking, not elsewhere classified: Secondary | ICD-10-CM | POA: Diagnosis not present

## 2023-02-21 DIAGNOSIS — R2689 Other abnormalities of gait and mobility: Secondary | ICD-10-CM | POA: Diagnosis not present

## 2023-02-21 NOTE — Therapy (Signed)
OUTPATIENT PHYSICAL THERAPY NEURO TREATMENT   Patient Name: Drew Andrews MRN: [id number] DOB:[birth year], 77 y.o., male Today's Date: 02/21/2023   PCP: Gaspar Garbe, MD REFERRING PROVIDER: Levert Feinstein, MD  END OF SESSION:  PT End of Session - 02/21/23 1016     Visit Number 5    Number of Visits 6    Date for PT Re-Evaluation 03/07/23    Authorization Type [employer]    PT Start Time 1015    PT Stop Time 1100    PT Time Calculation (min) 45 min             Past Medical History:  Diagnosis Date   Bursitis of elbow 05/20/2013   Essential hypertension 03/12/2013   GERD (gastroesophageal reflux disease)    Hyperlipidemia    Hypertension    Left ventricular diastolic dysfunction, NYHA class 2 03/12/2013   LVH (left ventricular hypertrophy)    with aortic stenosis-bicuspid   PONV (postoperative nausea and vomiting)    as a child   S/P AVR (aortic valve replacement) 05/13/2013   S/P AVR, 05/13/13, 25 mm Edwards Magna-Ease pericardial valve 05/13/2013   Seasonal allergies    Severe aortic stenosis 03/12/2013   Past Surgical History:  Procedure Laterality Date   AORTIC VALVE REPLACEMENT N/A 05/13/2013   Procedure: AORTIC VALVE REPLACEMENT (AVR);  Surgeon: Alleen Borne, MD;  Location: [facility] OR;  Service: Open Heart Surgery;  Laterality: N/A;   APPENDECTOMY  age 68   CARDIAC CATHETERIZATION     COLONOSCOPY     X 2   HIATAL HERNIA REPAIR     INTRAOPERATIVE TRANSESOPHAGEAL ECHOCARDIOGRAM N/A 05/13/2013   Procedure: INTRAOPERATIVE TRANSESOPHAGEAL ECHOCARDIOGRAM;  Surgeon: Alleen Borne, MD;  Location: [facility] OR;  Service: Open Heart Surgery;  Laterality: N/A;   LEFT HEART CATHETERIZATION WITH CORONARY ANGIOGRAM N/A 03/15/2013   Procedure: LEFT HEART CATHETERIZATION WITH CORONARY ANGIOGRAM;  Surgeon: Lennette Bihari, MD;  Location: [facility] CATH LAB;  Service: Cardiovascular;  Laterality: N/A;   TONSILLECTOMY     TRANSTHORACIC ECHOCARDIOGRAM  03/04/2013   EF 55-60%,  grade 2 diastolic dysfunction, AV with mod calcified leaflets & mild regurg, calcified MV annulus, LA mod dilated, RV mildly dilated   Patient Active Problem List   Diagnosis Date Noted   Cognitive impairment 01/12/2023   Parkinson's disease without dyskinesia or fluctuating manifestations (HCC) 01/12/2023   GERD (gastroesophageal reflux disease) 11/15/2015   Diplopia 02/21/2014   S/P AVR (aortic valve replacement) 07/13/2013   Rash of back, contact dermatitis resolved 05/31/2013   Bursitis of elbow 05/20/2013   S/P AVR, 05/13/13, 25 mm Edwards Magna-Ease pericardial valve 05/13/2013   Severe aortic stenosis 03/12/2013   Hyperlipidemia 03/12/2013   Essential hypertension 03/12/2013   Left ventricular diastolic dysfunction, NYHA class 2 03/12/2013    ONSET DATE: 1.5 year ago  REFERRING DIAG: G20.A1 (ICD-10-CM) - Parkinson's disease without dyskinesia or fluctuating manifestations R41.89 (ICD-10-CM) - Cognitive impairment  THERAPY DIAG:  Other abnormalities of gait and mobility  Unsteadiness on feet  Difficulty in walking, not elsewhere classified  Rationale for Evaluation and Treatment: Rehabilitation  SUBJECTIVE:  SUBJECTIVE STATEMENT: Doing good, try the HEP every day, not sure if correctly  Pt accompanied by: significant other  PERTINENT HISTORY: aortic valve replacement,   PAIN:  Are you having pain? No  PRECAUTIONS: None  RED FLAGS: None   WEIGHT BEARING RESTRICTIONS: No  FALLS: Has patient fallen in last 6 months? Yes. Number of falls 1  LIVING ENVIRONMENT: Lives with: lives with their family and lives with their spouse Lives in: House/apartment Stairs: Yes: Internal: flight steps; on right going up and External: 4-5 steps; on right going up Has following equipment at home:  None  PLOF: Independent  PATIENT GOALS: "figure out what I can and can't do"  OBJECTIVE:   TODAY'S TREATMENT: 02/21/23 Activity Comments  NU-step speed intervals x 8 min 2 min warm-up 30 sec 100+ SPM; 30 sec slow. For gen conditioning and rapid alternating movements, good recall and task accountability  Seated PWR! moves -UP-good recall -ROCK: cues for weight shift/position -Twist: good recall -STEP: cues for sequence  Standing PWR moves 50% teachback  Corner balance 100% teachback  Standing on foam: -static balance EO/EC x 30 sec -gross motor coordination:various throwing forms w/ green med ball  LE stretching -2x60 sec gastroc on slantboard -2x60 sec seated hamstring stretch--foot elevated on Bosu     TODAY'S TREATMENT: 02/14/23 Activity Comments  Seated PWR! moves 1x10 slow rehearsal, 1x5 reps for teach-back  Standing PWR! moves 1x10 slow rehearsal, 1x5 reps for teach-back  Corner balance See HEP  Gastroc stretch At counter 3x60 sec  Seated hamstring stretch Mat table 3x60 sec           PATIENT EDUCATION: Education details: assessment details, rationale of PT intervention in regards to PD Person educated: Patient and Spouse Education method: Explanation Education comprehension: verbalized understanding  HOME EXERCISE PROGRAM: --encouraged initial walking program of 15 minutes 3x/wk --Seated and Standing PWR! Moves  Access Code: [email or web address] URL: [email or web address] Date: 01/31/2023 Prepared by: Shary Decamp  Program Notes provided seated/standing PWR moves info  Exercises - Standing Gastroc Stretch  - 1 x daily - 7 x weekly - 3 sets - 60 sec hold - Corner Balance Feet Together With Eyes Open  - 1 x daily - 7 x weekly - 3 sets - 30 sec hold - Corner Balance Feet Together With Eyes Closed  - 1 x daily - 7 x weekly - 3 sets - 30 sec hold - Corner Balance Feet Together: Eyes Open With Head Turns  - 1 x daily - 7 x weekly - 3 sets - 3 reps - Corner  Balance Feet Together: Eyes Closed With Head Turns  - 1 x daily - 7 x weekly - 3 sets - 3 reps - Semi-Tandem Corner Balance With Eyes Open  - 1 x daily - 7 x weekly - 3 sets - 15-30 sec hold - Seated Hamstring Stretch  - 1 x daily - 7 x weekly - 1-3 sets - 30-60 sec hold   DIAGNOSTIC FINDINGS: reports MRI has been scheduled  COGNITION: Overall cognitive status:  spouse endorses some mild cognitive impairment   SENSATION: WFL  COORDINATION: Difficulty with rapid alternating movement Heel to shin WNL Finger to nose WNL  EDEMA:  none  MUSCLE TONE: increased hamstring tone/tightness. Slight ratcheting in bilat biceps   MUSCLE LENGTH: Hamstrings: Right -10 deg; Left -10 deg in long sitting   DTRs:  NT  POSTURE: forward head and flexed trunk   LOWER EXTREMITY ROM:     WFL  LOWER EXTREMITY  MMT:    BLE grossly 5/5  BED MOBILITY:  indep  TRANSFERS: Assistive device utilized: None  Sit to stand: Complete Independence Stand to sit: Complete Independence Chair to chair: Complete Independence Floor:  NT    CURB:  Level of Assistance: Complete Independence Assistive device utilized: None Curb Comments:   STAIRS: Level of Assistance: Complete Independence Stair Negotiation Technique: Alternating Pattern  with No Rails Number of Stairs: 8  Height of Stairs: 4-6  Comments:   GAIT: Gait pattern:  good stride but no trunk rotation/arm swing maintaining elbows flexed to 90 degrees and rigid to sides Distance walked:  Assistive device utilized: None Level of assistance: Complete Independence Comments:   FUNCTIONAL TESTS:  MINI-BESTest: 20/28  M-CTSIB  Condition 1: Firm Surface, EO 30 Sec, Normal Sway  Condition 2: Firm Surface, EC 30 Sec, Mild Sway  Condition 3: Foam Surface, EO 30 Sec, Mild Sway  Condition 4: Foam Surface, EC 30 Sec, Mild Sway         GOALS: Goals reviewed with patient? Yes  SHORT TERM GOALS: Target date: same as LTG    LONG  TERM GOALS: Target date: 03/07/2023    Patient will be independent in HEP to improve functional outcomes Baseline:  Goal status: IN PROGRESS  2.  Reduce risk for falls per score 25/28 Mini-BESTest Baseline: 20/28 Goal status: INITIAL  3.  Report improved activity tolerance as evidenced by ability to return to typical household chores Baseline: currently unable to mow lawn Goal status: IN PROGRESS  4.  Teach-back relevant community programs for those with PD to improve carryover and consistency of activity Baseline: TBD Goal status: IN PROGRESS    ASSESSMENT:  CLINICAL IMPRESSION: Initiated with HIIT training via NU-step with excellent performance and maintenance of 1:1 intervals.  Review of HEP with large amplitude movements and demo about 50% teach-back requiring verbal cues for steps/sequence.  Static balance HEP with 100% teach-back.  Continued sessions with multi-sensory balance challenges and gross motor coordination by way of various throwing forms with 4.4# medicine ball to improve coordination, power, and motor control with notable disappearance of UE tremoring during these tasks.  Static stretching to end session to improve flexibility and reduce rigidity. Will perform re-assessment at next session for recert vs D/C.  OBJECTIVE IMPAIRMENTS: Abnormal gait, decreased activity tolerance, decreased balance, decreased endurance, decreased knowledge of condition, decreased mobility, difficulty walking, impaired perceived functional ability, impaired flexibility, impaired tone, and postural dysfunction.   ACTIVITY LIMITATIONS: carrying, lifting, reach over head, and locomotion level  PARTICIPATION LIMITATIONS: cleaning, laundry, community activity, and yard work  PERSONAL FACTORS: Age and Time since onset of injury/illness/exacerbation are also affecting patient's functional outcome.   REHAB POTENTIAL: Excellent  CLINICAL DECISION MAKING: Stable/uncomplicated  EVALUATION  COMPLEXITY: Low  PLAN:  PT FREQUENCY: 1x/week  PT DURATION: 6 weeks  PLANNED INTERVENTIONS: Therapeutic exercises, Therapeutic activity, Neuromuscular re-education, Balance training, Gait training, Patient/Family education, Self Care, Joint mobilization, Stair training, Vestibular training, Canalith repositioning, DME instructions, Aquatic Therapy, Dry Needling, Spinal mobilization, Cryotherapy, Moist heat, and Manual therapy  PLAN FOR NEXT SESSION: recert vs D/C? PWR moves class   10:16 AM, 02/21/23 M. Shary Decamp, PT, DPT Physical Therapist- [place] Office Number: [phone]

## 2023-02-28 ENCOUNTER — Ambulatory Visit: Payer: Medicare HMO

## 2023-02-28 DIAGNOSIS — R2689 Other abnormalities of gait and mobility: Secondary | ICD-10-CM | POA: Diagnosis not present

## 2023-02-28 DIAGNOSIS — R2681 Unsteadiness on feet: Secondary | ICD-10-CM

## 2023-02-28 DIAGNOSIS — R262 Difficulty in walking, not elsewhere classified: Secondary | ICD-10-CM | POA: Diagnosis not present

## 2023-02-28 NOTE — Therapy (Signed)
OUTPATIENT PHYSICAL THERAPY NEURO TREATMENT, Recertification, Progress Note   Patient Name: Drew Andrews MRN: [id number] DOB:25-Sep-1945, 77 y.o., male Today's Date: 02/28/2023   PCP: Gaspar Garbe, MD REFERRING PROVIDER: Levert Feinstein, MD Progress Note Reporting Period 01/24/23 to 02/28/23  See note below for Objective Data and Assessment of Progress/Goals.      END OF SESSION:  PT End of Session - 02/28/23 1014     Visit Number 6    Number of Visits 9    Date for PT Re-Evaluation 03/21/23    Authorization Type [employer] Medicare    PT Start Time 1015    PT Stop Time 1100    PT Time Calculation (min) 45 min             Past Medical History:  Diagnosis Date   Bursitis of elbow 05/20/2013   Essential hypertension 03/12/2013   GERD (gastroesophageal reflux disease)    Hyperlipidemia    Hypertension    Left ventricular diastolic dysfunction, NYHA class 2 03/12/2013   LVH (left ventricular hypertrophy)    with aortic stenosis-bicuspid   PONV (postoperative nausea and vomiting)    as a child   S/P AVR (aortic valve replacement) 05/13/2013   S/P AVR, 05/13/13, 25 mm Edwards Magna-Ease pericardial valve 05/13/2013   Seasonal allergies    Severe aortic stenosis 03/12/2013   Past Surgical History:  Procedure Laterality Date   AORTIC VALVE REPLACEMENT N/A 05/13/2013   Procedure: AORTIC VALVE REPLACEMENT (AVR);  Surgeon: Alleen Borne, MD;  Location: [facility] OR;  Service: Open Heart Surgery;  Laterality: N/A;   APPENDECTOMY  age 69   CARDIAC CATHETERIZATION     COLONOSCOPY     X 2   HIATAL HERNIA REPAIR     INTRAOPERATIVE TRANSESOPHAGEAL ECHOCARDIOGRAM N/A 05/13/2013   Procedure: INTRAOPERATIVE TRANSESOPHAGEAL ECHOCARDIOGRAM;  Surgeon: Alleen Borne, MD;  Location: [facility] OR;  Service: Open Heart Surgery;  Laterality: N/A;   LEFT HEART CATHETERIZATION WITH CORONARY ANGIOGRAM N/A 03/15/2013   Procedure: LEFT HEART CATHETERIZATION WITH CORONARY ANGIOGRAM;  Surgeon: Lennette Bihari, MD;  Location: [facility] CATH LAB;  Service: Cardiovascular;  Laterality: N/A;   TONSILLECTOMY     TRANSTHORACIC ECHOCARDIOGRAM  03/04/2013   EF 55-60%, grade 2 diastolic dysfunction, AV with mod calcified leaflets & mild regurg, calcified MV annulus, LA mod dilated, RV mildly dilated   Patient Active Problem List   Diagnosis Date Noted   Cognitive impairment 01/12/2023   Parkinson's disease without dyskinesia or fluctuating manifestations (HCC) 01/12/2023   GERD (gastroesophageal reflux disease) 11/15/2015   Diplopia 02/21/2014   S/P AVR (aortic valve replacement) 07/13/2013   Rash of back, contact dermatitis resolved 05/31/2013   Bursitis of elbow 05/20/2013   S/P AVR, 05/13/13, 25 mm Edwards Magna-Ease pericardial valve 05/13/2013   Severe aortic stenosis 03/12/2013   Hyperlipidemia 03/12/2013   Essential hypertension 03/12/2013   Left ventricular diastolic dysfunction, NYHA class 2 03/12/2013    ONSET DATE: 1.5 year ago  REFERRING DIAG: G20.A1 (ICD-10-CM) - Parkinson's disease without dyskinesia or fluctuating manifestations R41.89 (ICD-10-CM) - Cognitive impairment  THERAPY DIAG:  Other abnormalities of gait and mobility  Unsteadiness on feet  Difficulty in walking, not elsewhere classified  Rationale for Evaluation and Treatment: Rehabilitation  SUBJECTIVE:  SUBJECTIVE STATEMENT: Doing ok. Have not been to classes. Working out every day, walking with trekking poles.  Have tried to progressively increase walking time by performing two. 15-minute trials and/or 17/13 min intervals but do not feel well the rest of the day  Pt accompanied by: significant other  PERTINENT HISTORY: aortic valve replacement,   PAIN:  Are you having pain? No  PRECAUTIONS: None  RED FLAGS: None   WEIGHT  BEARING RESTRICTIONS: No  FALLS: Has patient fallen in last 6 months? Yes. Number of falls 1  LIVING ENVIRONMENT: Lives with: lives with their family and lives with their spouse Lives in: House/apartment Stairs: Yes: Internal: flight steps; on right going up and External: 4-5 steps; on right going up Has following equipment at home: None  PLOF: Independent  PATIENT GOALS: "figure out what I can and can't do"  OBJECTIVE:   TODAY'S TREATMENT: 02/28/23 Activity Comments  Discussion of endurance-based exercise methods   Mini-BESTest 23/28  4-square step test 11.31 sec  Discussion of community-based classes Endorse PWR moves class           TODAY'S TREATMENT: 02/21/23 Activity Comments  NU-step speed intervals x 8 min 2 min warm-up 30 sec 100+ SPM; 30 sec slow. For gen conditioning and rapid alternating movements, good recall and task accountability  Seated PWR! moves -UP-good recall -ROCK: cues for weight shift/position -Twist: good recall -STEP: cues for sequence  Standing PWR moves 50% teachback  Corner balance 100% teachback  Standing on foam: -static balance EO/EC x 30 sec -gross motor coordination:various throwing forms w/ green med ball  LE stretching -2x60 sec gastroc on slantboard -2x60 sec seated hamstring stretch--foot elevated on Bosu            PATIENT EDUCATION: Education details: assessment details, rationale of PT intervention in regards to PD Person educated: Patient and Spouse Education method: Explanation Education comprehension: verbalized understanding  HOME EXERCISE PROGRAM: --encouraged initial walking program of 15 minutes 3x/wk --Seated and Standing PWR! Moves  Access Code: [email or web address] URL: [email or web address] Date: 01/31/2023 Prepared by: Shary Decamp  Program Notes provided seated/standing PWR moves info  Exercises - Standing Gastroc Stretch  - 1 x daily - 7 x weekly - 3 sets - 60 sec hold - Corner Balance Feet  Together With Eyes Open  - 1 x daily - 7 x weekly - 3 sets - 30 sec hold - Corner Balance Feet Together With Eyes Closed  - 1 x daily - 7 x weekly - 3 sets - 30 sec hold - Corner Balance Feet Together: Eyes Open With Head Turns  - 1 x daily - 7 x weekly - 3 sets - 3 reps - Corner Balance Feet Together: Eyes Closed With Head Turns  - 1 x daily - 7 x weekly - 3 sets - 3 reps - Semi-Tandem Corner Balance With Eyes Open  - 1 x daily - 7 x weekly - 3 sets - 15-30 sec hold - Seated Hamstring Stretch  - 1 x daily - 7 x weekly - 1-3 sets - 30-60 sec hold   DIAGNOSTIC FINDINGS: reports MRI has been scheduled  COGNITION: Overall cognitive status:  spouse endorses some mild cognitive impairment   SENSATION: WFL  COORDINATION: Difficulty with rapid alternating movement Heel to shin WNL Finger to nose WNL  EDEMA:  none  MUSCLE TONE: increased hamstring tone/tightness. Slight ratcheting in bilat biceps   MUSCLE LENGTH: Hamstrings: Right -10 deg; Left -10 deg in long sitting   DTRs:  NT  POSTURE: forward head and flexed trunk   LOWER EXTREMITY ROM:     WFL  LOWER EXTREMITY MMT:    BLE grossly 5/5  BED MOBILITY:  indep  TRANSFERS: Assistive device utilized: None  Sit to stand: Complete Independence Stand to sit: Complete Independence Chair to chair: Complete Independence Floor:  NT    CURB:  Level of Assistance: Complete Independence Assistive device utilized: None Curb Comments:   STAIRS: Level of Assistance: Complete Independence Stair Negotiation Technique: Alternating Pattern  with No Rails Number of Stairs: 8  Height of Stairs: 4-6  Comments:   GAIT: Gait pattern:  good stride but no trunk rotation/arm swing maintaining elbows flexed to 90 degrees and rigid to sides Distance walked:  Assistive device utilized: None Level of assistance: Complete Independence Comments:   FUNCTIONAL TESTS:  MINI-BESTest: 20/28  M-CTSIB  Condition 1: Firm Surface, EO 30  Sec, Normal Sway  Condition 2: Firm Surface, EC 30 Sec, Mild Sway  Condition 3: Foam Surface, EO 30 Sec, Mild Sway  Condition 4: Foam Surface, EC 30 Sec, Mild Sway         GOALS: Goals reviewed with patient? Yes  SHORT TERM GOALS: Target date: same as LTG    LONG TERM GOALS: Target date: 03/07/2023    Patient will be independent in HEP to improve functional outcomes Baseline:  Goal status: MET  2.  Reduce risk for falls per score 25/28 Mini-BESTest Baseline: 20/28; (02/28/23) 23/28 Goal status: IN PROGRESS  3.  Report improved activity tolerance as evidenced by ability to return to typical household chores Baseline: currently unable to mow lawn; (02/28/23) reports improved endurance for household duties Goal status: MET  4.  Teach-back relevant community programs for those with PD to improve carryover and consistency of activity Baseline: has trialed PD-cycling class Goal status: IN PROGRESS  5.  Demo low risk for falls per time 9.68 sec 4-Square Step Test  Baseline: 11.68  Goal status: INITIAL ASSESSMENT:  CLINICAL IMPRESSION: Review of POC details with improved performance on Mini-BESTest from initial 20 to 23/28. Able to meet 2 of 4 initial goals and addition of 4-square step test revealing increased risk for falls.  Pt notes overall improvement in endurance/activity tolerance performing activities at home for greater time before onset of fatigue.  Independence with initial HEP and encouraged continued trials of community-classes for those with PD.  I feel he would benefit from continued sessions to progess large amplitude activities to improve flexibility, coordination, and mobility and will encouarge participation with local PWR moves class. Continued sessions to progress these program details  OBJECTIVE IMPAIRMENTS: Abnormal gait, decreased activity tolerance, decreased balance, decreased endurance, decreased knowledge of condition, decreased mobility, difficulty  walking, impaired perceived functional ability, impaired flexibility, impaired tone, and postural dysfunction.   ACTIVITY LIMITATIONS: carrying, lifting, reach over head, and locomotion level  PARTICIPATION LIMITATIONS: cleaning, laundry, community activity, and yard work  PERSONAL FACTORS: Age and Time since onset of injury/illness/exacerbation are also affecting patient's functional outcome.   REHAB POTENTIAL: Excellent  CLINICAL DECISION MAKING: Stable/uncomplicated  EVALUATION COMPLEXITY: Low  PLAN:  PT FREQUENCY: 1x/week  PT DURATION: 6 weeks  PLANNED INTERVENTIONS: Therapeutic exercises, Therapeutic activity, Neuromuscular re-education, Balance training, Gait training, Patient/Family education, Self Care, Joint mobilization, Stair training, Vestibular training, Canalith repositioning, DME instructions, Aquatic Therapy, Dry Needling, Spinal mobilization, Cryotherapy, Moist heat, and Manual therapy  PLAN FOR NEXT SESSION: full PWR moves activities   11:02 AM, 02/28/23 M. Shary Decamp, PT, DPT Physical  Therapist- [place] Office Number: [phone]

## 2023-03-07 ENCOUNTER — Ambulatory Visit: Payer: Medicare HMO | Attending: Neurology

## 2023-03-07 DIAGNOSIS — R2681 Unsteadiness on feet: Secondary | ICD-10-CM | POA: Insufficient documentation

## 2023-03-07 DIAGNOSIS — R2689 Other abnormalities of gait and mobility: Secondary | ICD-10-CM | POA: Diagnosis not present

## 2023-03-07 DIAGNOSIS — R262 Difficulty in walking, not elsewhere classified: Secondary | ICD-10-CM | POA: Diagnosis not present

## 2023-03-07 NOTE — Therapy (Signed)
OUTPATIENT PHYSICAL THERAPY NEURO TREATMENT Patient Name: Drew Andrews MRN: [id number] DOB:1945/05/30, 77 y.o., male Today's Date: 03/07/2023   PCP: Gaspar Garbe, MD REFERRING PROVIDER: Levert Feinstein, MD   END OF SESSION:  PT End of Session - 03/07/23 1015     Visit Number 7    Number of Visits 9    Date for PT Re-Evaluation 03/21/23    Authorization Type [employer]    PT Start Time 1015    PT Stop Time 1100    PT Time Calculation (min) 45 min             Past Medical History:  Diagnosis Date   Bursitis of elbow 05/20/2013   Essential hypertension 03/12/2013   GERD (gastroesophageal reflux disease)    Hyperlipidemia    Hypertension    Left ventricular diastolic dysfunction, NYHA class 2 03/12/2013   LVH (left ventricular hypertrophy)    with aortic stenosis-bicuspid   PONV (postoperative nausea and vomiting)    as a child   S/P AVR (aortic valve replacement) 05/13/2013   S/P AVR, 05/13/13, 25 mm Edwards Magna-Ease pericardial valve 05/13/2013   Seasonal allergies    Severe aortic stenosis 03/12/2013   Past Surgical History:  Procedure Laterality Date   AORTIC VALVE REPLACEMENT N/A 05/13/2013   Procedure: AORTIC VALVE REPLACEMENT (AVR);  Surgeon: Alleen Borne, MD;  Location: [facility] OR;  Service: Open Heart Surgery;  Laterality: N/A;   APPENDECTOMY  age 69   CARDIAC CATHETERIZATION     COLONOSCOPY     X 2   HIATAL HERNIA REPAIR     INTRAOPERATIVE TRANSESOPHAGEAL ECHOCARDIOGRAM N/A 05/13/2013   Procedure: INTRAOPERATIVE TRANSESOPHAGEAL ECHOCARDIOGRAM;  Surgeon: Alleen Borne, MD;  Location: [facility] OR;  Service: Open Heart Surgery;  Laterality: N/A;   LEFT HEART CATHETERIZATION WITH CORONARY ANGIOGRAM N/A 03/15/2013   Procedure: LEFT HEART CATHETERIZATION WITH CORONARY ANGIOGRAM;  Surgeon: Lennette Bihari, MD;  Location: [facility] CATH LAB;  Service: Cardiovascular;  Laterality: N/A;   TONSILLECTOMY     TRANSTHORACIC ECHOCARDIOGRAM  03/04/2013   EF 55-60%, grade 2  diastolic dysfunction, AV with mod calcified leaflets & mild regurg, calcified MV annulus, LA mod dilated, RV mildly dilated   Patient Active Problem List   Diagnosis Date Noted   Cognitive impairment 01/12/2023   Parkinson's disease without dyskinesia or fluctuating manifestations (HCC) 01/12/2023   GERD (gastroesophageal reflux disease) 11/15/2015   Diplopia 02/21/2014   S/P AVR (aortic valve replacement) 07/13/2013   Rash of back, contact dermatitis resolved 05/31/2013   Bursitis of elbow 05/20/2013   S/P AVR, 05/13/13, 25 mm Edwards Magna-Ease pericardial valve 05/13/2013   Severe aortic stenosis 03/12/2013   Hyperlipidemia 03/12/2013   Essential hypertension 03/12/2013   Left ventricular diastolic dysfunction, NYHA class 2 03/12/2013    ONSET DATE: 1.5 year ago  REFERRING DIAG: G20.A1 (ICD-10-CM) - Parkinson's disease without dyskinesia or fluctuating manifestations R41.89 (ICD-10-CM) - Cognitive impairment  THERAPY DIAG:  Other abnormalities of gait and mobility  Unsteadiness on feet  Difficulty in walking, not elsewhere classified  Rationale for Evaluation and Treatment: Rehabilitation  SUBJECTIVE:  SUBJECTIVE STATEMENT: Still having difficulty with sustained cardiovascular activities 20-25 minutes leaves me feeling exhausted and unwell.   Pt accompanied by: significant other  PERTINENT HISTORY: aortic valve replacement,   PAIN:  Are you having pain? No  PRECAUTIONS: None  RED FLAGS: None   WEIGHT BEARING RESTRICTIONS: No  FALLS: Has patient fallen in last 6 months? Yes. Number of falls 1  LIVING ENVIRONMENT: Lives with: lives with their family and lives with their spouse Lives in: House/apartment Stairs: Yes: Internal: flight steps; on right going up and External: 4-5  steps; on right going up Has following equipment at home: None  PLOF: Independent  PATIENT GOALS: "figure out what I can and can't do"  OBJECTIVE:    TODAY'S TREATMENT: 03/07/23 Activity Comments  Seated PWR moves 1x10 verbal cues 25%  Standing PWR moves 1x10 verbal cues 25%  Supine PWR moves 1x10, pictures and verbal cues  Prone PWR moves 1x10, visual, tactile, verbal cues  Quadruped PWR moves  1x10, visual, tactile, verbal cues  Hamstring stretch 2x60 sec   Gastroc stretch 2x60 sec               PATIENT EDUCATION: Education details: assessment details, rationale of PT intervention in regards to PD Person educated: Patient and Spouse Education method: Explanation Education comprehension: verbalized understanding  HOME EXERCISE PROGRAM: --encouraged initial walking program of 15 minutes 3x/wk --Seated and Standing PWR! Moves  Access Code: [email or web address] URL: [email or web address] Date: 01/31/2023 Prepared by: Shary Decamp  Program Notes provided seated/standing PWR moves info Supine, prone, quadruped PWR moves added 03/07/23  Exercises - Standing Gastroc Stretch  - 1 x daily - 7 x weekly - 3 sets - 60 sec hold - Corner Balance Feet Together With Eyes Open  - 1 x daily - 7 x weekly - 3 sets - 30 sec hold - Corner Balance Feet Together With Eyes Closed  - 1 x daily - 7 x weekly - 3 sets - 30 sec hold - Corner Balance Feet Together: Eyes Open With Head Turns  - 1 x daily - 7 x weekly - 3 sets - 3 reps - Corner Balance Feet Together: Eyes Closed With Head Turns  - 1 x daily - 7 x weekly - 3 sets - 3 reps - Semi-Tandem Corner Balance With Eyes Open  - 1 x daily - 7 x weekly - 3 sets - 15-30 sec hold - Seated Hamstring Stretch  - 1 x daily - 7 x weekly - 1-3 sets - 30-60 sec hold   DIAGNOSTIC FINDINGS: reports MRI has been scheduled  COGNITION: Overall cognitive status:  spouse endorses some mild cognitive  impairment   SENSATION: WFL  COORDINATION: Difficulty with rapid alternating movement Heel to shin WNL Finger to nose WNL  EDEMA:  none  MUSCLE TONE: increased hamstring tone/tightness. Slight ratcheting in bilat biceps   MUSCLE LENGTH: Hamstrings: Right -10 deg; Left -10 deg in long sitting   DTRs:  NT  POSTURE: forward head and flexed trunk   LOWER EXTREMITY ROM:     WFL  LOWER EXTREMITY MMT:    BLE grossly 5/5  BED MOBILITY:  indep  TRANSFERS: Assistive device utilized: None  Sit to stand: Complete Independence Stand to sit: Complete Independence Chair to chair: Complete Independence Floor:  NT    CURB:  Level of Assistance: Complete Independence Assistive device utilized: None Curb Comments:   STAIRS: Level of Assistance: Complete Independence Stair Negotiation Technique: Alternating Pattern  with No Rails Number of  Stairs: 8  Height of Stairs: 4-6  Comments:   GAIT: Gait pattern:  good stride but no trunk rotation/arm swing maintaining elbows flexed to 90 degrees and rigid to sides Distance walked:  Assistive device utilized: None Level of assistance: Complete Independence Comments:   FUNCTIONAL TESTS:  MINI-BESTest: 20/28  M-CTSIB  Condition 1: Firm Surface, EO 30 Sec, Normal Sway  Condition 2: Firm Surface, EC 30 Sec, Mild Sway  Condition 3: Foam Surface, EO 30 Sec, Mild Sway  Condition 4: Foam Surface, EC 30 Sec, Mild Sway         GOALS: Goals reviewed with patient? Yes  SHORT TERM GOALS: Target date: same as LTG    LONG TERM GOALS: Target date: 03/07/2023    Patient will be independent in HEP to improve functional outcomes Baseline:  Goal status: MET  2.  Reduce risk for falls per score 25/28 Mini-BESTest Baseline: 20/28; (02/28/23) 23/28 Goal status: IN PROGRESS  3.  Report improved activity tolerance as evidenced by ability to return to typical household chores Baseline: currently unable to mow lawn; (02/28/23)  reports improved endurance for household duties Goal status: MET  4.  Teach-back relevant community programs for those with PD to improve carryover and consistency of activity Baseline: has trialed PD-cycling class Goal status: IN PROGRESS  5.  Demo low risk for falls per time 9.68 sec 4-Square Step Test  Baseline: 11.68  Goal status: INITIAL ASSESSMENT:  CLINICAL IMPRESSION: Review of initial seated and standing large amplitude movements with improved recall only requiring 25% cues in sequence without reference materials.  Instructed in supine, prone, and quadruped large amplitude movements to enhance mobility, flexibility, and coordination requiring use of printed, visual, verbal, tactile cues for sequence with improving carryover with subsequent reps. Ended with instruction in static stretching to improve flexibility and reduce tone/rigidity. Continued sessions to progress large amplitude activities with intent to progress to community class.  OBJECTIVE IMPAIRMENTS: Abnormal gait, decreased activity tolerance, decreased balance, decreased endurance, decreased knowledge of condition, decreased mobility, difficulty walking, impaired perceived functional ability, impaired flexibility, impaired tone, and postural dysfunction.   ACTIVITY LIMITATIONS: carrying, lifting, reach over head, and locomotion level  PARTICIPATION LIMITATIONS: cleaning, laundry, community activity, and yard work  PERSONAL FACTORS: Age and Time since onset of injury/illness/exacerbation are also affecting patient's functional outcome.   REHAB POTENTIAL: Excellent  CLINICAL DECISION MAKING: Stable/uncomplicated  EVALUATION COMPLEXITY: Low  PLAN:  PT FREQUENCY: 1x/week  PT DURATION: 6 weeks  PLANNED INTERVENTIONS: Therapeutic exercises, Therapeutic activity, Neuromuscular re-education, Balance training, Gait training, Patient/Family education, Self Care, Joint mobilization, Stair training, Vestibular training,  Canalith repositioning, DME instructions, Aquatic Therapy, Dry Needling, Spinal mobilization, Cryotherapy, Moist heat, and Manual therapy  PLAN FOR NEXT SESSION: full PWR moves activities   10:15 AM, 03/07/23 M. Shary Decamp, PT, DPT Physical Therapist- [place] Office Number: [phone]

## 2023-03-14 ENCOUNTER — Ambulatory Visit: Payer: Medicare HMO

## 2023-03-14 DIAGNOSIS — R262 Difficulty in walking, not elsewhere classified: Secondary | ICD-10-CM

## 2023-03-14 DIAGNOSIS — R2681 Unsteadiness on feet: Secondary | ICD-10-CM

## 2023-03-14 DIAGNOSIS — R2689 Other abnormalities of gait and mobility: Secondary | ICD-10-CM

## 2023-03-14 NOTE — Therapy (Signed)
OUTPATIENT PHYSICAL THERAPY NEURO TREATMENT Patient Name: Drew Andrews MRN: [id number] DOB:01-11-1946, 77 y.o., male Today's Date: 03/14/2023   PCP: Gaspar Garbe, MD REFERRING PROVIDER: Levert Feinstein, MD   END OF SESSION:  PT End of Session - 03/14/23 0939     Visit Number 8    Number of Visits 9    Date for PT Re-Evaluation 03/21/23    Authorization Type [employer] Medicare    PT Start Time 0930    PT Stop Time 1015    PT Time Calculation (min) 45 min             Past Medical History:  Diagnosis Date   Bursitis of elbow 05/20/2013   Essential hypertension 03/12/2013   GERD (gastroesophageal reflux disease)    Hyperlipidemia    Hypertension    Left ventricular diastolic dysfunction, NYHA class 2 03/12/2013   LVH (left ventricular hypertrophy)    with aortic stenosis-bicuspid   PONV (postoperative nausea and vomiting)    as a child   S/P AVR (aortic valve replacement) 05/13/2013   S/P AVR, 05/13/13, 25 mm Edwards Magna-Ease pericardial valve 05/13/2013   Seasonal allergies    Severe aortic stenosis 03/12/2013   Past Surgical History:  Procedure Laterality Date   AORTIC VALVE REPLACEMENT N/A 05/13/2013   Procedure: AORTIC VALVE REPLACEMENT (AVR);  Surgeon: Alleen Borne, MD;  Location: [facility] OR;  Service: Open Heart Surgery;  Laterality: N/A;   APPENDECTOMY  age 49   CARDIAC CATHETERIZATION     COLONOSCOPY     X 2   HIATAL HERNIA REPAIR     INTRAOPERATIVE TRANSESOPHAGEAL ECHOCARDIOGRAM N/A 05/13/2013   Procedure: INTRAOPERATIVE TRANSESOPHAGEAL ECHOCARDIOGRAM;  Surgeon: Alleen Borne, MD;  Location: [facility] OR;  Service: Open Heart Surgery;  Laterality: N/A;   LEFT HEART CATHETERIZATION WITH CORONARY ANGIOGRAM N/A 03/15/2013   Procedure: LEFT HEART CATHETERIZATION WITH CORONARY ANGIOGRAM;  Surgeon: Lennette Bihari, MD;  Location: [facility] CATH LAB;  Service: Cardiovascular;  Laterality: N/A;   TONSILLECTOMY     TRANSTHORACIC ECHOCARDIOGRAM  03/04/2013   EF 55-60%, grade  2 diastolic dysfunction, AV with mod calcified leaflets & mild regurg, calcified MV annulus, LA mod dilated, RV mildly dilated   Patient Active Problem List   Diagnosis Date Noted   Cognitive impairment 01/12/2023   Parkinson's disease without dyskinesia or fluctuating manifestations (HCC) 01/12/2023   GERD (gastroesophageal reflux disease) 11/15/2015   Diplopia 02/21/2014   S/P AVR (aortic valve replacement) 07/13/2013   Rash of back, contact dermatitis resolved 05/31/2013   Bursitis of elbow 05/20/2013   S/P AVR, 05/13/13, 25 mm Edwards Magna-Ease pericardial valve 05/13/2013   Severe aortic stenosis 03/12/2013   Hyperlipidemia 03/12/2013   Essential hypertension 03/12/2013   Left ventricular diastolic dysfunction, NYHA class 2 03/12/2013    ONSET DATE: 1.5 year ago  REFERRING DIAG: G20.A1 (ICD-10-CM) - Parkinson's disease without dyskinesia or fluctuating manifestations R41.89 (ICD-10-CM) - Cognitive impairment  THERAPY DIAG:  Other abnormalities of gait and mobility  Unsteadiness on feet  Difficulty in walking, not elsewhere classified  Rationale for Evaluation and Treatment: Rehabilitation  SUBJECTIVE:  SUBJECTIVE STATEMENT: Doing ok, the fatigue is still a prominent issue when sustaining activity 20+ minutes, be it exercise or physical work  Pt accompanied by: significant other  PERTINENT HISTORY: aortic valve replacement,   PAIN:  Are you having pain? No  PRECAUTIONS: None  RED FLAGS: None   WEIGHT BEARING RESTRICTIONS: No  FALLS: Has patient fallen in last 6 months? Yes. Number of falls 1  LIVING ENVIRONMENT: Lives with: lives with their family and lives with their spouse Lives in: House/apartment Stairs: Yes: Internal: flight steps; on right going up and External: 4-5  steps; on right going up Has following equipment at home: None  PLOF: Independent  PATIENT GOALS: "figure out what I can and can't do"  OBJECTIVE:   TODAY'S TREATMENT: 03/14/23 Activity Comments  Pt perform 10 min NU-step prior to start of session   Supine PWR moves 1x10 w/ cues for rock and step--good teachback on remaining  Prone PWR moves 1x10 w/ cues for twist and step set-up/sequence  Quadruped PWR moves 1x10, difficulty with rock/step  Seated PWR moves 1x10, 100% teach-back  Standing PWR moves   LE stretching x 60 sec Posterior chain stretching to reduce rigidity     TODAY'S TREATMENT: 03/07/23 Activity Comments  Seated PWR moves 1x10 verbal cues 25%  Standing PWR moves 1x10 verbal cues 25%  Supine PWR moves 1x10, pictures and verbal cues  Prone PWR moves 1x10, visual, tactile, verbal cues  Quadruped PWR moves  1x10, visual, tactile, verbal cues  Hamstring stretch 2x60 sec   Gastroc stretch 2x60 sec               PATIENT EDUCATION: Education details: assessment details, rationale of PT intervention in regards to PD Person educated: Patient and Spouse Education method: Explanation Education comprehension: verbalized understanding  HOME EXERCISE PROGRAM: --encouraged initial walking program of 15 minutes 3x/wk --Seated and Standing PWR! Moves  Access Code: [email or web address] URL: [email or web address] Date: 01/31/2023 Prepared by: Shary Decamp  Program Notes provided seated/standing PWR moves info Supine, prone, quadruped PWR moves added 03/07/23  Exercises - Standing Gastroc Stretch  - 1 x daily - 7 x weekly - 3 sets - 60 sec hold - Corner Balance Feet Together With Eyes Open  - 1 x daily - 7 x weekly - 3 sets - 30 sec hold - Corner Balance Feet Together With Eyes Closed  - 1 x daily - 7 x weekly - 3 sets - 30 sec hold - Corner Balance Feet Together: Eyes Open With Head Turns  - 1 x daily - 7 x weekly - 3 sets - 3 reps - Corner Balance Feet  Together: Eyes Closed With Head Turns  - 1 x daily - 7 x weekly - 3 sets - 3 reps - Semi-Tandem Corner Balance With Eyes Open  - 1 x daily - 7 x weekly - 3 sets - 15-30 sec hold - Seated Hamstring Stretch  - 1 x daily - 7 x weekly - 1-3 sets - 30-60 sec hold   DIAGNOSTIC FINDINGS: reports MRI has been scheduled  COGNITION: Overall cognitive status:  spouse endorses some mild cognitive impairment   SENSATION: WFL  COORDINATION: Difficulty with rapid alternating movement Heel to shin WNL Finger to nose WNL  EDEMA:  none  MUSCLE TONE: increased hamstring tone/tightness. Slight ratcheting in bilat biceps   MUSCLE LENGTH: Hamstrings: Right -10 deg; Left -10 deg in long sitting   DTRs:  NT  POSTURE: forward head and flexed trunk   LOWER  EXTREMITY ROM:     WFL  LOWER EXTREMITY MMT:    BLE grossly 5/5  BED MOBILITY:  indep  TRANSFERS: Assistive device utilized: None  Sit to stand: Complete Independence Stand to sit: Complete Independence Chair to chair: Complete Independence Floor:  NT    CURB:  Level of Assistance: Complete Independence Assistive device utilized: None Curb Comments:   STAIRS: Level of Assistance: Complete Independence Stair Negotiation Technique: Alternating Pattern  with No Rails Number of Stairs: 8  Height of Stairs: 4-6  Comments:   GAIT: Gait pattern:  good stride but no trunk rotation/arm swing maintaining elbows flexed to 90 degrees and rigid to sides Distance walked:  Assistive device utilized: None Level of assistance: Complete Independence Comments:   FUNCTIONAL TESTS:  MINI-BESTest: 20/28  M-CTSIB  Condition 1: Firm Surface, EO 30 Sec, Normal Sway  Condition 2: Firm Surface, EC 30 Sec, Mild Sway  Condition 3: Foam Surface, EO 30 Sec, Mild Sway  Condition 4: Foam Surface, EC 30 Sec, Mild Sway         GOALS: Goals reviewed with patient? Yes  SHORT TERM GOALS: Target date: same as LTG    LONG TERM GOALS:  Target date: 03/07/2023    Patient will be independent in HEP to improve functional outcomes Baseline:  Goal status: MET  2.  Reduce risk for falls per score 25/28 Mini-BESTest Baseline: 20/28; (02/28/23) 23/28 Goal status: IN PROGRESS  3.  Report improved activity tolerance as evidenced by ability to return to typical household chores Baseline: currently unable to mow lawn; (02/28/23) reports improved endurance for household duties Goal status: MET  4.  Teach-back relevant community programs for those with PD to improve carryover and consistency of activity Baseline: has trialed PD-cycling class Goal status: IN PROGRESS  5.  Demo low risk for falls per time 9.68 sec 4-Square Step Test  Baseline: 11.68  Goal status: INITIAL ASSESSMENT:  CLINICAL IMPRESSION: Improved carryover of large amplitude movement for HEP demo 80% recall requiring cues for set-up and sequence on certain movements.  Notes ongoing issues with endurance.  Cues for proper stretching sequence to sustain stretch to improve flexibility/reduce rigidity.  Will continue x 1 session to refine HEP and reinforce class attendance.    OBJECTIVE IMPAIRMENTS: Abnormal gait, decreased activity tolerance, decreased balance, decreased endurance, decreased knowledge of condition, decreased mobility, difficulty walking, impaired perceived functional ability, impaired flexibility, impaired tone, and postural dysfunction.   ACTIVITY LIMITATIONS: carrying, lifting, reach over head, and locomotion level  PARTICIPATION LIMITATIONS: cleaning, laundry, community activity, and yard work  PERSONAL FACTORS: Age and Time since onset of injury/illness/exacerbation are also affecting patient's functional outcome.   REHAB POTENTIAL: Excellent  CLINICAL DECISION MAKING: Stable/uncomplicated  EVALUATION COMPLEXITY: Low  PLAN:  PT FREQUENCY: 1x/week  PT DURATION: 6 weeks  PLANNED INTERVENTIONS: Therapeutic exercises, Therapeutic  activity, Neuromuscular re-education, Balance training, Gait training, Patient/Family education, Self Care, Joint mobilization, Stair training, Vestibular training, Canalith repositioning, DME instructions, Aquatic Therapy, Dry Needling, Spinal mobilization, Cryotherapy, Moist heat, and Manual therapy  PLAN FOR NEXT SESSION: full PWR moves activities/HEP review; D/C assessment   9:39 AM, 03/14/23 M. Shary Decamp, PT, DPT Physical Therapist- [place] Office Number: [phone]

## 2023-03-21 ENCOUNTER — Ambulatory Visit: Payer: Medicare HMO

## 2023-03-21 DIAGNOSIS — R2681 Unsteadiness on feet: Secondary | ICD-10-CM

## 2023-03-21 DIAGNOSIS — R262 Difficulty in walking, not elsewhere classified: Secondary | ICD-10-CM

## 2023-03-21 DIAGNOSIS — R2689 Other abnormalities of gait and mobility: Secondary | ICD-10-CM | POA: Diagnosis not present

## 2023-03-21 NOTE — Therapy (Signed)
OUTPATIENT PHYSICAL THERAPY NEURO TREATMENT and D/C Summary  Patient Name: Drew Andrews MRN: [id number] DOB:Mar 07, 1946, 77 y.o., male Today's Date: 03/21/2023   PCP: Gaspar Garbe, MD REFERRING PROVIDER: Levert Feinstein, MD  PHYSICAL THERAPY DISCHARGE SUMMARY  Visits from Start of Care: 9  Current functional level related to goals / functional outcomes: Able to meet all goals and outcome measures, see below for details   Remaining deficits: Reports limited endurance of 10-20 minutes of cardiovascular activities   Education / Equipment: HEP and relevant community resources    Patient agrees to discharge. Patient goals were met. Patient is being discharged due to meeting the stated rehab goals.  END OF SESSION:  PT End of Session - 03/21/23 1020     Visit Number 9    Number of Visits 9    Date for PT Re-Evaluation 03/21/23    Authorization Type [employer]    PT Start Time 1018    PT Stop Time 1100    PT Time Calculation (min) 42 min             Past Medical History:  Diagnosis Date   Bursitis of elbow 05/20/2013   Essential hypertension 03/12/2013   GERD (gastroesophageal reflux disease)    Hyperlipidemia    Hypertension    Left ventricular diastolic dysfunction, NYHA class 2 03/12/2013   LVH (left ventricular hypertrophy)    with aortic stenosis-bicuspid   PONV (postoperative nausea and vomiting)    as a child   S/P AVR (aortic valve replacement) 05/13/2013   S/P AVR, 05/13/13, 25 mm Edwards Magna-Ease pericardial valve 05/13/2013   Seasonal allergies    Severe aortic stenosis 03/12/2013   Past Surgical History:  Procedure Laterality Date   AORTIC VALVE REPLACEMENT N/A 05/13/2013   Procedure: AORTIC VALVE REPLACEMENT (AVR);  Surgeon: Alleen Borne, MD;  Location: [facility] OR;  Service: Open Heart Surgery;  Laterality: N/A;   APPENDECTOMY  age 29   CARDIAC CATHETERIZATION     COLONOSCOPY     X 2   HIATAL HERNIA REPAIR     INTRAOPERATIVE  TRANSESOPHAGEAL ECHOCARDIOGRAM N/A 05/13/2013   Procedure: INTRAOPERATIVE TRANSESOPHAGEAL ECHOCARDIOGRAM;  Surgeon: Alleen Borne, MD;  Location: [facility] OR;  Service: Open Heart Surgery;  Laterality: N/A;   LEFT HEART CATHETERIZATION WITH CORONARY ANGIOGRAM N/A 03/15/2013   Procedure: LEFT HEART CATHETERIZATION WITH CORONARY ANGIOGRAM;  Surgeon: Lennette Bihari, MD;  Location: [facility] CATH LAB;  Service: Cardiovascular;  Laterality: N/A;   TONSILLECTOMY     TRANSTHORACIC ECHOCARDIOGRAM  03/04/2013   EF 55-60%, grade 2 diastolic dysfunction, AV with mod calcified leaflets & mild regurg, calcified MV annulus, LA mod dilated, RV mildly dilated   Patient Active Problem List   Diagnosis Date Noted   Cognitive impairment 01/12/2023   Parkinson's disease without dyskinesia or fluctuating manifestations (HCC) 01/12/2023   GERD (gastroesophageal reflux disease) 11/15/2015   Diplopia 02/21/2014   S/P AVR (aortic valve replacement) 07/13/2013   Rash of back, contact dermatitis resolved 05/31/2013   Bursitis of elbow 05/20/2013   S/P AVR, 05/13/13, 25 mm Edwards Magna-Ease pericardial valve 05/13/2013   Severe aortic stenosis 03/12/2013   Hyperlipidemia 03/12/2013   Essential hypertension 03/12/2013   Left ventricular diastolic dysfunction, NYHA class 2 03/12/2013    ONSET DATE: 1.5 year ago  REFERRING DIAG: G20.A1 (ICD-10-CM) - Parkinson's disease without dyskinesia or fluctuating manifestations R41.89 (ICD-10-CM) - Cognitive impairment  THERAPY DIAG:  Other abnormalities of gait and mobility  Unsteadiness on feet  Difficulty in walking, not elsewhere classified  Rationale for Evaluation and Treatment: Rehabilitation  SUBJECTIVE:                                                                                                                                                                                             SUBJECTIVE STATEMENT: Doing well with the PWR moves activities, walking 10-20 minutes  around the neighborhood  Pt accompanied by: significant other  PERTINENT HISTORY: aortic valve replacement,   PAIN:  Are you having pain? No  PRECAUTIONS: None  RED FLAGS: None   WEIGHT BEARING RESTRICTIONS: No  FALLS: Has patient fallen in last 6 months? Yes. Number of falls 1  LIVING ENVIRONMENT: Lives with: lives with their family and lives with their spouse Lives in: House/apartment Stairs: Yes: Internal: flight steps; on right going up and External: 4-5 steps; on right going up Has following equipment at home: None  PLOF: Independent  PATIENT GOALS: "figure out what I can and can't do"  OBJECTIVE:   TODAY'S TREATMENT: 03/21/23 Activity Comments  POC review   4 square Trial 1) 10 sec Trial 2) 8.69 sec Average = 9.3 sec  PWR moves review               TODAY'S TREATMENT: 03/14/23 Activity Comments  Pt perform 10 min NU-step prior to start of session   Supine PWR moves 1x10 w/ cues for rock and step--good teachback on remaining  Prone PWR moves 1x10 w/ cues for twist and step set-up/sequence  Quadruped PWR moves 1x10, difficulty with rock/step  Seated PWR moves 1x10, 100% teach-back  Standing PWR moves   LE stretching x 60 sec Posterior chain stretching to reduce rigidity         PATIENT EDUCATION: Education details: assessment details, rationale of PT intervention in regards to PD Person educated: Patient and Spouse Education method: Explanation Education comprehension: verbalized understanding  HOME EXERCISE PROGRAM: --encouraged initial walking program of 15 minutes 3x/wk --Seated and Standing PWR! Moves  Access Code: [email or web address] URL: [email or web address] Date: 01/31/2023 Prepared by: Shary Decamp  Program Notes provided seated/standing PWR moves info Supine, prone, quadruped PWR moves added 03/07/23  Exercises - Standing Gastroc Stretch  - 1 x daily - 7 x weekly - 3 sets - 60 sec hold - Corner Balance Feet Together With Eyes  Open  - 1 x daily - 7 x weekly - 3 sets - 30 sec hold - Corner Balance Feet Together With Eyes Closed  - 1 x daily - 7 x weekly - 3 sets - 30 sec hold - Corner Balance Feet Together: Eyes Open With Head  Turns  - 1 x daily - 7 x weekly - 3 sets - 3 reps - Corner Balance Feet Together: Eyes Closed With Head Turns  - 1 x daily - 7 x weekly - 3 sets - 3 reps - Semi-Tandem Corner Balance With Eyes Open  - 1 x daily - 7 x weekly - 3 sets - 15-30 sec hold - Seated Hamstring Stretch  - 1 x daily - 7 x weekly - 1-3 sets - 30-60 sec hold   DIAGNOSTIC FINDINGS: reports MRI has been scheduled  COGNITION: Overall cognitive status:  spouse endorses some mild cognitive impairment   SENSATION: WFL  COORDINATION: Difficulty with rapid alternating movement Heel to shin WNL Finger to nose WNL  EDEMA:  none  MUSCLE TONE: increased hamstring tone/tightness. Slight ratcheting in bilat biceps   MUSCLE LENGTH: Hamstrings: Right -10 deg; Left -10 deg in long sitting   DTRs:  NT  POSTURE: forward head and flexed trunk   LOWER EXTREMITY ROM:     WFL  LOWER EXTREMITY MMT:    BLE grossly 5/5  BED MOBILITY:  indep  TRANSFERS: Assistive device utilized: None  Sit to stand: Complete Independence Stand to sit: Complete Independence Chair to chair: Complete Independence Floor:  NT    CURB:  Level of Assistance: Complete Independence Assistive device utilized: None Curb Comments:   STAIRS: Level of Assistance: Complete Independence Stair Negotiation Technique: Alternating Pattern  with No Rails Number of Stairs: 8  Height of Stairs: 4-6  Comments:   GAIT: Gait pattern:  good stride but no trunk rotation/arm swing maintaining elbows flexed to 90 degrees and rigid to sides Distance walked:  Assistive device utilized: None Level of assistance: Complete Independence Comments:   FUNCTIONAL TESTS:  MINI-BESTest: 20/28  M-CTSIB  Condition 1: Firm Surface, EO 30 Sec, Normal Sway   Condition 2: Firm Surface, EC 30 Sec, Mild Sway  Condition 3: Foam Surface, EO 30 Sec, Mild Sway  Condition 4: Foam Surface, EC 30 Sec, Mild Sway         GOALS: Goals reviewed with patient? Yes  SHORT TERM GOALS: Target date: same as LTG    LONG TERM GOALS: Target date: 03/21/2023    Patient will be independent in HEP to improve functional outcomes Baseline:  Goal status: MET  2.  Reduce risk for falls per score 25/28 Mini-BESTest Baseline: 20/28; (02/28/23) 23/28; (03/21/23) 27/28 Goal status: MET  3.  Report improved activity tolerance as evidenced by ability to return to typical household chores Baseline: currently unable to mow lawn; (02/28/23) reports improved endurance for household duties Goal status: MET  4.  Teach-back relevant community programs for those with PD to improve carryover and consistency of activity Baseline: has trialed PD-cycling class; reports will enroll in PWR moves class Goal status: MET  5.  Demo low risk for falls per time 9.68 sec 4-Square Step Test  Baseline: 11.68; (03/21/23) 9.3 sec  Goal status: MET  ASSESSMENT:  CLINICAL IMPRESSION: Pt has met goals and demonstrates low risk for fall per outcome measures and has made significant improvements in motor control and balance overall.  Our focus has been on implementing HEP and exposure to different community exercise classes.  Demonstrates excellent teach-back of activities and principles. Will f/u for screen in 6 months  OBJECTIVE IMPAIRMENTS: Abnormal gait, decreased activity tolerance, decreased balance, decreased endurance, decreased knowledge of condition, decreased mobility, difficulty walking, impaired perceived functional ability, impaired flexibility, impaired tone, and postural dysfunction.   ACTIVITY LIMITATIONS:  carrying, lifting, reach over head, and locomotion level  PARTICIPATION LIMITATIONS: cleaning, laundry, community activity, and yard work  PERSONAL FACTORS: Age  and Time since onset of injury/illness/exacerbation are also affecting patient's functional outcome.   REHAB POTENTIAL: Excellent  CLINICAL DECISION MAKING: Stable/uncomplicated  EVALUATION COMPLEXITY: Low  PLAN:  PT FREQUENCY: 1x/week  PT DURATION: 6 weeks  PLANNED INTERVENTIONS: Therapeutic exercises, Therapeutic activity, Neuromuscular re-education, Balance training, Gait training, Patient/Family education, Self Care, Joint mobilization, Stair training, Vestibular training, Canalith repositioning, DME instructions, Aquatic Therapy, Dry Needling, Spinal mobilization, Cryotherapy, Moist heat, and Manual therapy  PLAN FOR NEXT SESSION: HEP and community classes   10:20 AM, 03/21/23 M. Shary Decamp, PT, DPT Physical Therapist- [place] Office Number: [phone]

## 2023-04-11 ENCOUNTER — Encounter: Payer: Self-pay | Admitting: Cardiovascular Disease

## 2023-04-11 ENCOUNTER — Ambulatory Visit: Payer: Medicare HMO | Attending: Cardiovascular Disease | Admitting: Cardiovascular Disease

## 2023-04-11 VITALS — BP 126/84 | HR 60 | Ht 69.5 in | Wt 170.4 lb

## 2023-04-11 DIAGNOSIS — G20A1 Parkinson's disease without dyskinesia, without mention of fluctuations: Secondary | ICD-10-CM

## 2023-04-11 DIAGNOSIS — I2510 Atherosclerotic heart disease of native coronary artery without angina pectoris: Secondary | ICD-10-CM

## 2023-04-11 DIAGNOSIS — E785 Hyperlipidemia, unspecified: Secondary | ICD-10-CM | POA: Diagnosis not present

## 2023-04-11 DIAGNOSIS — I10 Essential (primary) hypertension: Secondary | ICD-10-CM

## 2023-04-11 DIAGNOSIS — I350 Nonrheumatic aortic (valve) stenosis: Secondary | ICD-10-CM | POA: Diagnosis not present

## 2023-04-11 DIAGNOSIS — E7800 Pure hypercholesterolemia, unspecified: Secondary | ICD-10-CM

## 2023-04-11 DIAGNOSIS — I440 Atrioventricular block, first degree: Secondary | ICD-10-CM | POA: Diagnosis not present

## 2023-04-11 DIAGNOSIS — Z952 Presence of prosthetic heart valve: Secondary | ICD-10-CM | POA: Diagnosis not present

## 2023-04-11 DIAGNOSIS — Z79899 Other long term (current) drug therapy: Secondary | ICD-10-CM | POA: Diagnosis not present

## 2023-04-11 MED ORDER — EZETIMIBE 10 MG PO TABS
10.0000 mg | ORAL_TABLET | Freq: Every day | ORAL | 3 refills | Status: DC
Start: 2023-04-11 — End: 2023-06-12

## 2023-04-11 MED ORDER — ROSUVASTATIN CALCIUM 20 MG PO TABS
20.0000 mg | ORAL_TABLET | Freq: Every day | ORAL | 3 refills | Status: DC
Start: 2023-04-11 — End: 2023-04-19

## 2023-04-11 NOTE — Progress Notes (Unsigned)
Cardiology Office Note    Date:  04/12/2023   ID:  Drew Andrews, DOB 11/12/1945, MRN [id number]  PCP:  Gaspar Garbe, MD  Cardiologist:  Nicki Guadalajara, MD   21  month follow-up  History of Present Illness:  Drew Andrews is a 77 y.o. male who was first told of having a heart murmur back in the 1980s when he was planning to work at [employer] and had a physical exam at that time. I have seen him since 2005 when he was referred to me by [name] for cardiac murmur. Since 2005 he had undergone follow-up  echo Doppler studies to assess his aortic valve. In 2005 his aortic transvalvular gradient was 37 with a mean gradient of 18. Over the last 9 years, his aortic valve murmur gradually become more significant and he remained asymptomatic.  An echo in 11/03/2014showed normal LV function with  grade 2 diastolic dysfunction. His aortic valve again was moderately calcified. The mean gradient had increased to 47 and peak instantaneous gradient 86 giving a calculated aortic valve area of 0.63 cm. He did have mitral annular calcification. His left atrium was moderately dilated. He did have very mild RV dilatation.   When I saw him in November 2014 Drew Andrews was continuing to exercise but there was new development of exertional shortness of breath. At that time, I strongly recommended cardiac catheterization and he underwent right and left heart cardiac catheterization on 03/15/2013. This confirmed severe aortic valve stenosis mild pulmonary hypertension. He had mild chronic calcification but nonobstructive 20% narrowing in the LAD.    He underwent successful aortic valve replacement surgery by Dr. Evelene Croon 05/13/2013 and had a 25 mm [facility] Ease bovine pericardial valve inserted. He has done remarkably well following his valve replacement surgery. He denies any episodes of palpitations. He has resumed activity.   Since his valve replacement, he has  continued to feel well.  He denies any shortness of breath or chest pain.  He does exercise regularly.  Typically in the cold winter months.  He likes to exercise at cardiac rehabilitation for the months of January and February..  However, he has had several rare occurrences of diplopia it are short lived.  He has been taking aspirin 81 mg daily.  A repeat echo Doppler study on 12/23/2013.revealed an EF 55-60% with mild LVH.  His aortic prosthesis was well-seated and open well.  There was only trivial aortic insufficiency.  He did have mild dilatation of his left atrium and mitral annular calcification.  There also was some dilatation of his RV.     His echo in October 2016 continued to show excellent LV function with an EF of 55-60% and he had normal diastolic parameters.  The bioprosthetic AV was  functioning normally.  The gradients were felt to be very minimally elevated and unchanged from his previous study with a mean gradient of 11 and peak gradient of 23 mm.  There was no evidence for aortic insufficiency.  There was evidence for mitral annular calcification and mild LA dilatation.   He underwent a 2-D echo Doppler study on 08/02/2016.  He had normal LV function with EF of 60-65% and grade 2 diastolic dysfunction.  His aortic bioprosthesis was well-seated and was functioning well.  The mean gradient was 11 and peak gradient 20.  He had mild MR.  There was mild TR and mild LA dilatation.     He participates the maintenance  phase of cardiac rehabilitation over the winter months and will be ending this week.  Typically over the summer he rides his bike.  On days he does not do cardiac rehabilitation in the winter he often walks and if the weather is nice.  He may ride his bike occasionally.  He typically gained some weight over the winter months but loses weight over the summer when he is more active outside.  He denies any chest pain, presyncope or syncope, palpitations, or change in exercise tolerance.   Recently, he has noticed his blood pressure being slightly elevated in the 130s to 140s when he arrives at cardiac rehabilitation after exercise his blood pressure is normal.  He has increased his lisinopril to 7.5 mg.    When I saw him in October 2018, he denied any episodes of chest pain or shortness of breath.  He was exercising and walks for at least 30 minutes or bikes 7 days per week.  Over the winter months, he again participated in the cardiac rehabilitation program.  His blood pressure was mildly elevated and I titrated lisinopril to 15 mg daily.  He continues to take metoprolol succinate 50 mg.  He has been on generic Vytorin 10/40 for hyperlipidemia.    He underwent a follow-up echo Doppler study on January 08, 2018.  This showed normal systolic function with grade 2 diastolic dysfunction.  EF was 55 to 60%.  His aortic bioprosthesis was well-seated.  Mean gradient was 12.  There was no AI.  His a sending aorta was mildly dilated.  There was mild MR and mild to moderate biatrial enlargement.  He continues to be asymptomatic and feels well.  He is enjoying retirement.     Since I saw him in December 2019, he again participated in the cardiac rehabilitation program over the winter months.  He was now back riding his bike at least 30 minutes a day and also on days that he does not ride his bike he typically walks at a fast pace.    I evaluated him in a telemedicine visit on Sep 11, 2018 which time he continued to be stable and denied any chest pain, PND, or orthopnea.  He was taking  lisinopril 20 mg and Toprol-XL 50 mg for hypertension.  His blood pressure has been well controlled and typically when he leaves cardiac rehab it would be approximately 120/70.  He continues to be on Vytorin 10/40 for hyperlipidemia.  He has a hiatal hernia and GERD which is well controlled with omeprazole.  His laboratory typically is done by Dr. Wylene Simmer.  I saw him in December 2020 and since his prior evaluation he  had remained stable.  He was continuing  to ride his bike but in the winter months seems to walk more due to the weather.  Typically walks for 30 minutes for 2.2 miles.  He underwent a follow-up echo Doppler study on March 14, 2019.  EF remains 60 to 65% with mild LVH of the basal septum.  Diastolic function was indeterminate.  Had a stable bioprosthetic aortic valve with normal gradients with a mean gradient of 10 and peak gradient of 18.7.  There was no perivalvular aortic insufficiency.  He had mild pulmonic regurgitation.  His ascending aorta measured 39 mm.  He has continued to be on generic Vytorin 10/40 and lipid studies in August 2020 showed a total cholesterol 129, HDL 43, LDL 72, and triglycerides 70.  He continues to be on Toprol-XL 50 mg and  lisinopril 20 mg daily.  GERD is controlled with omeprazole.    I saw him in July 2021 and since his last evaluation he was either walking or biking on a daily basis.   Due to the Covid pandemic he did not do cardiac rehab over the winter.  He had recently had an insurance person come to the house.  Apparently they had done ABIs and noticed his right ABI was reduced at 0.85 and left ABI was 1.24.  They were concerned about possible peripheral vascular disease.  He sees Dr. Wylene Simmer who has checked laboratory.  He continues to be on generic Vytorin 10/40, lisinopril 20 mg daily, metoprolol succinate 50 mg daily and takes omeprazole over-the-counter daily.  During that evaluation, his blood pressure was elevated and I recommended slight titration of lisinopril from 20 mg up to 30 mg daily for more optimal blood pressure control.  Following his evaluation, he underwent lower extremity arterial Doppler study on January 13, 2020.  This revealed noncompressible right ankle brachial index and left ankle brachial index but normal right toe brachial and left toe brachial indices.  When I saw him in June 22, 2020 he continued to feel well and was without chest pain  or shortness of breath.  He denied any palpitations.. He sees Dr. Wylene Simmer who checks his laboratory.  He denies presyncope or syncope.  He continues to try to walk daily has not been biking recently.  He continues to be on Vytorin 10/40 hyperlipidemia and lisinopril 30 mg with metoprolol succinate 50 mg daily for hypertension.  GERD is treated with over-the-counter Prilosec.  I reviewed his lower extremity vascular ultrasound which revealed normal to brachial indices.   He was evaluated by Marjie Skiff and apparently was having some issues with blood pressure control.  During her evaluation, she recommended he discontinue metoprolol and in its place initiate carvedilol 6.25 mg twice a day.  His lisinopril was increased to 40 mg.  He subsequently kept blood pressure log and with blood pressure elevation amlodipine 5 mg was added to his regimen.  He tells me he subsequently saw Dr. Wylene Simmer and apparently never discontinued all and was taking both metoprolol in addition to carvedilol.  At that time the metoprolol was discontinued.  He underwent a follow-up echo Doppler study on December 03, 2020 which showed normal LV function with EF 60 to 65%.  There was moderate concentric LVH, grade 2 diastolic dysfunction, moderate left atrial dilatation.  He had stable aortic valve replacement with a mean gradient of 10.9 and peak gradient of 20.5 mmHg.  I saw him on February 22, 2021.  He felt well and deniesdchest pain or shortness of breath.  He believes he is starting to slow down.  He used to walk a mile and 12 and half minutes and now takes him approximately 15 minutes to walk a mile and he does feel tired.  He denies any chest tightness or dyspnea.  He continues to exercise at least 30 minutes a day.  He recently had laboratory with Dr. Wylene Simmer.  Cholesterol was 141, triglycerides 92, HDL 51, and LDL 72.  I last saw him on October 27, 2021.  At that time he continued to be active and was walking at least 30 minutes a  day at a rate of approximately 4 mph, slightly reduced from his previous 5 mph.  He denies any chest pain or significant change in exercise tolerance.  He recently had an episode of significant constipation which improved  with Metamucil.  He also has started to notice a minimal essential tremor of his right thumb.  He denied chest pain, presyncope or syncope.  He denied palpitations.    He underwent an echo Doppler study on June 27, 2022.  This continued show normal LV function with EF 55 to 60%.  There was grade 2 diastolic dysfunction.  His bioprosthetic aortic valve had normal gradient.  There was no aortic insufficiency.  There was moderate mitral annular calcification and very mild dilation of ascending aorta 37 mm.  I last saw him on July 13, 2022 at which time he denied any chest pain or shortness of breath.  However, he has noted some more fatigability.  He is still walking but typically after 20 to 25 minutes of walking he feels like he needs to stop.  Toward the end of his walk he also has difficult time seeming to elevate his legs as much.  He is continues to notice mild tremor of his right hand.  He states his wife believes he is not as quick and his thinking is somewhat slower than previously.  He denies , palpitations, presyncope or syncope.  Since I last saw him, he has been evaluated by Azalee Course, PA and last saw him on December 23, 2022.  He had undergone coronary CTA evaluation on November 18, 2022.  Chest CT did not show acute extracardiac findings.  Calcium score was significantly elevated at 4318 representing 95th percentile.  He had diffuse severe coronary calcifications and there was significant blooming artifact.  Ascending aorta was mildly dilated at 39 mm.  He was status post TAVR.  FFR analysis was essentially negative.  Has been diagnosed with [place] disease and is now followed by Dr. Terrace Arabia of neurology and currently is on carbidopa levodopa 25/100 mg 3 times a day.  He continues to  be on amlodipine 5 mg, carvedilol 6.25 mg twice a day, lisinopril 40 mg daily and aspirin therapy for hypertension.  He is on Vytorin 10/40.  Most recent laboratory at [facility] by Dr. Wylene Simmer on February 01, 2023 showed total cholesterol 121, triglycerides 70, LDL 60.  Hemoglobin A1c was 5.5.  Chemistry was normal with normal LFTs.  He now presents for follow-up evaluation.   Past Medical History:  Diagnosis Date   Bursitis of elbow 05/20/2013   Essential hypertension 03/12/2013   GERD (gastroesophageal reflux disease)    Hyperlipidemia    Hypertension    Left ventricular diastolic dysfunction, NYHA class 2 03/12/2013   LVH (left ventricular hypertrophy)    with aortic stenosis-bicuspid   PONV (postoperative nausea and vomiting)    as a child   S/P AVR (aortic valve replacement) 05/13/2013   S/P AVR, 05/13/13, 25 mm [facility]-Ease pericardial valve 05/13/2013   Seasonal allergies    Severe aortic stenosis 03/12/2013    Past Surgical History:  Procedure Laterality Date   AORTIC VALVE REPLACEMENT N/A 05/13/2013   Procedure: AORTIC VALVE REPLACEMENT (AVR);  Surgeon: Alleen Borne, MD;  Location: [facility];  Service: Open Heart Surgery;  Laterality: N/A;   APPENDECTOMY  age 57   CARDIAC CATHETERIZATION     COLONOSCOPY     X 2   HIATAL HERNIA REPAIR     INTRAOPERATIVE TRANSESOPHAGEAL ECHOCARDIOGRAM N/A 05/13/2013   Procedure: INTRAOPERATIVE TRANSESOPHAGEAL ECHOCARDIOGRAM;  Surgeon: Alleen Borne, MD;  Location: [facility] OR;  Service: Open Heart Surgery;  Laterality: N/A;   LEFT HEART CATHETERIZATION WITH CORONARY ANGIOGRAM N/A 03/15/2013   Procedure:  LEFT HEART CATHETERIZATION WITH CORONARY ANGIOGRAM;  Surgeon: Lennette Bihari, MD;  Location: [facility] CATH LAB;  Service: Cardiovascular;  Laterality: N/A;   TONSILLECTOMY     TRANSTHORACIC ECHOCARDIOGRAM  03/04/2013   EF 55-60%, grade 2 diastolic dysfunction, AV with mod calcified leaflets & mild regurg, calcified MV annulus, LA mod dilated, RV  mildly dilated    Current Medications: Outpatient Medications Prior to Visit  Medication Sig Dispense Refill   amLODipine (NORVASC) 5 MG tablet Take 1 tablet (5 mg total) by mouth daily. 90 tablet 3   amoxicillin (AMOXIL) 500 MG tablet Take 4 tablets 1 hour prior to dental procedure 8 tablet 1   aspirin 81 MG tablet Take 81 mg by mouth daily.     carbidopa-levodopa (SINEMET IR) 25-100 MG tablet Take 1 tablet by mouth 3 (three) times daily. 90 tablet 6   carvedilol (COREG) 6.25 MG tablet Take 1 tablet (6.25 mg total) by mouth 2 (two) times daily. 180 tablet 3   diphenhydrAMINE (BENADRYL) 25 MG tablet Take 25 mg by mouth every 6 (six) hours as needed.     lisinopril (ZESTRIL) 40 MG tablet Take 1 tablet (40 mg total) by mouth daily. 90 tablet 3   omeprazole (PRILOSEC OTC) 20 MG tablet Take 20 mg by mouth daily.     tamsulosin (FLOMAX) 0.4 MG CAPS capsule Take 0.4 mg by mouth daily.     ezetimibe-simvastatin (VYTORIN) 10-40 MG tablet Take 1 tablet by mouth at bedtime. 90 tablet 3   No facility-administered medications prior to visit.     Allergies:   Scopolamine   Social History   Socioeconomic History   Marital status: Married    Spouse name: Not on file   Number of children: 1   Years of education: college   Highest education level: Not on file  Occupational History   Not on file  Tobacco Use   Smoking status: Former    Current packs/day: 0.00    Types: Cigarettes    Quit date: 02/10/1974    Years since quitting: 49.2   Smokeless tobacco: Never  Substance and Sexual Activity   Alcohol use: Yes    Comment: Drinks 1 bottle of wine daily and 1 beer   Drug use: No   Sexual activity: Not on file  Other Topics Concern   Not on file  Social History Narrative   Not on file   Social Determinants of Health   Financial Resource Strain: Not on file  Food Insecurity: Not on file  Transportation Needs: Not on file  Physical Activity: Not on file  Stress: Not on file  Social  Connections: Not on file      Additional social history is notable in that 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 He quit tobacco in 1979. He continues to exercise 7 days per week.  Family History:  The patient's family history includes Acute myelogenous leukemia in his brother; Diabetes in his mother; Heart attack in his father and maternal grandfather; Hyperlipidemia in his mother; Hypertension in his mother; Pneumonia in his maternal grandmother.   ROS General: Negative; No fevers, chills, or night sweats;  HEENT: Negative; No changes in vision or hearing, sinus congestion, difficulty swallowing Pulmonary: Negative; No cough, wheezing, shortness of breath, hemoptysis Cardiovascular: See HPI GI: Negative; No nausea, vomiting, diarrhea, or abdominal pain GU: Negative; No dysuria, hematuria, or difficulty voiding Musculoskeletal: Negative; no myalgias, joint pain, or weakness Hematologic/Oncology: Negative; no  easy bruising, bleeding Endocrine: Negative; no heat/cold intolerance; no diabetes Neuro: Left hand tremor with recent diagnosis of Parkinson's disease Skin: Negative; No rashes or skin lesions Psychiatric: Negative; No behavioral problems, depression Sleep: Negative; No snoring, daytime sleepiness, hypersomnolence, bruxism, restless legs, hypnogognic hallucinations, no cataplexy Other comprehensive 14 point system review is negative.   PHYSICAL EXAM:   VS:  BP 126/84   Pulse 60   Ht 5' 9.5" (1.765 m)   Wt 170 lb 6.4 oz (77.3 kg)   SpO2 98%   BMI 24.80 kg/m     Repeat blood pressure by me was 140/78.  Wt Readings from Last 3 Encounters:  04/11/23 170 lb 6.4 oz (77.3 kg)  01/12/23 162 lb (73.5 kg)  12/21/22 166 lb (75.3 kg)   General: Alert, oriented, no distress.  Skin: normal turgor, no rashes, warm and dry HEENT: Normocephalic, atraumatic. Pupils equal round and reactive to light; sclera anicteric; extraocular  muscles intact; Fundi ** Nose without nasal septal hypertrophy Mouth/Parynx benign; Mallinpatti scale 3 Neck: No JVD, no carotid bruits; normal carotid upstroke Lungs: clear to ausculatation and percussion; no wheezing or rales Chest wall: without tenderness to palpitation Heart: PMI not displaced, RRR, s1 s2 normal, 1/6 systolic murmur, no diastolic murmur, no rubs, gallops, thrills, or heaves Abdomen: soft, nontender; no hepatosplenomehaly, BS+; abdominal aorta nontender and not dilated by palpation. Back: no CVA tenderness Pulses 2+ Musculoskeletal: full range of motion, normal strength, no joint deformities Extremities: no clubbing cyanosis or edema, Homan's sign negative  Neurologic: No cogwheel rigidity;grossly nonfocal; Cranial nerves grossly wnl Psychologic: Normal mood and affect       Studies/Labs Reviewed:   EKG Interpretation Date/Time:  Tuesday April 11 2023 11:03:03 EST Ventricular Rate:  60 PR Interval:  256 QRS Duration:  106 QT Interval:  398 QTC Calculation: 398 R Axis:   31  Text Interpretation: Sinus rhythm with 1st degree A-V block When compared with ECG of 11-Apr-2023 11:02, No significant change was found Confirmed by Nicki Guadalajara ([id number]) on 04/11/2023 11:52:20 AM    July 13, 2022 ECG (independently read by me): Sinus rhythm at 63, 1st degree AV block, PR 270 msec  October 27, 2021 ECG (independently read by me): Sinus rhythm with 1st degree AV block; PR 298 msec  February 22, 2021 ECG (independently read by me): Sinus rhythm at 64, 1 st degree AV block, PR 276 msec  June 08, 2020 ECG (independently read by me): Sinus Bradycardia at 50; 1st degree AV block PR 272 msec; QTc 417 msec  July 2022 ECG (independently read by me): Sinus bradycardia at 54 bpm with first-degree AV block; PR interval 282 ms.  LVH by voltage criteria in aVL.  December 2020 ECG (independently read by me): Sinus bradycardia at 58 bpm.  First-degree AV block with a parable of 256  ms.  LVH by voltage.  QTc interval 402 ms.  December 2019 ECG (independently read by me): Sinus bradycardia 59 bpm with first-degree AV block.  PR interval 278 ms.  LVH by voltage criteria.   March 2019 ECG (independently read by me): Sinus rhythm at 63 bpm.  First-degree AV block.  PAC.  Borderline voltage criteria for LVH.   October 2018 ECG (independently read by me): Sinus bradycardia 58 bpm.  First-degree AV block.  LVH by voltage.   April 2018 ECG (independently read by me): Sinus bradycardia at 58 bpm.  First degree AV block Borderline LVH by voltage criteria.   July 2017 ECG (independently read  by me): Sinus bradycardia at 53 bpm.  Mild first-degree AV block with a PR interval at 226 ms.  Borderline LVH by voltage criteria.  Mild T wave abnormality in 3 and aVF.   November 2016 ECG (independently read by me): Normal sinus rhythm at 65 bpm.  First-degree AV block with a PR interval at 244 ms.   October 2015 ECG disease independently read by me): Normal sinus rhythm at 57 beats per minute.  Mild first degree AV block.   Prior 07/08/2013 EKG (independently read by the) normal sinus rhythm. Non-specific ST-T changes inferolaterally.   Prior 03/12/13 ECG: Sinus rhythm at 66 beats per minute. First-degree AV block. LVH by voltage criteria. Mild nondiagnostic T changes.  Recent Labs:    Latest Ref Rng & Units 11/14/2022   11:22 AM 07/08/2020   12:14 PM 06/22/2020    3:40 PM  BMP  Glucose 70 - 99 mg/dL 97  98  82   BUN 8 - 27 mg/dL 24  20  19    Creatinine 0.76 - 1.27 mg/dL 1.61  0.96  0.45   BUN/Creat Ratio 10 - 24 20  15  17    Sodium 134 - 144 mmol/L 141  139  139   Potassium 3.5 - 5.2 mmol/L 4.9  4.4  CANCELED   Chloride 96 - 106 mmol/L 104  103  104   CO2 20 - 29 mmol/L 23  19  16    Calcium 8.6 - 10.2 mg/dL 9.4  9.2  9.3         Latest Ref Rng & Units 07/08/2013    8:56 AM 05/09/2013    1:13 PM 03/12/2013    9:31 AM  Hepatic Function  Total Protein 6.0 - 8.3 g/dL 6.5  7.4  7.0    Albumin 3.5 - 5.2 g/dL 4.1  4.2  4.6   AST 0 - 37 U/L 16  24  17    ALT 0 - 53 U/L 21  33  18   Alk Phosphatase 39 - 117 U/L 44  43  39   Total Bilirubin 0.2 - 1.2 mg/dL 0.4  0.5  0.7        Latest Ref Rng & Units 11/14/2022   11:22 AM 08/19/2013    9:20 AM 07/08/2013    8:56 AM  CBC  WBC 3.4 - 10.8 x10E3/uL 7.8  6.8  6.8   Hemoglobin 13.0 - 17.7 g/dL 40.9  81.1  91.4   Hematocrit 37.5 - 51.0 % 36.7  39.3  36.7   Platelets 150 - 450 x10E3/uL 151  170  166    Lab Results  Component Value Date   MCV 103 (H) 11/14/2022   MCV 92.0 08/19/2013   MCV 95.1 07/08/2013   Lab Results  Component Value Date   TSH 1.030 01/12/2023   Lab Results  Component Value Date   HGBA1C 5.5 05/09/2013     BNP No results found for: "BNP"  ProBNP No results found for: "PROBNP"   Lipid Panel     Component Value Date/Time   CHOL 122 07/08/2013 0856   TRIG 89 07/08/2013 0856   HDL 42 07/08/2013 0856   CHOLHDL 2.9 07/08/2013 0856   VLDL 18 07/08/2013 0856   LDLCALC 62 07/08/2013 0856     RADIOLOGY: No results found.   Additional studies/ records that were reviewed today include:   ECHO SUMMARY:03/14/2019 Left Ventricle: Normal LV size and function with EF 60-65%. There is mild hypertrophy of the basal  septum. Diastolic dysfunction is inderterminant. There is increased LV filling pressures. Right Ventricle: Normal RV size and RVF. Right Atrium: Normal size. Left Atrium: Severely dilated. Mitral Valve: Moderate thickening and calcification of the anterior MV leaflet with moderate mitral annular calcification and trivial MR. Aortic Valve: S/P bioprosthetic AVR that appears to be functioning normally. Aortic valve mean gradient measures 10.0 mmHg. Aortic valve peak gradient measures 18.7 mmHg. Aortic valve area, by VTI measures 1.32 cm. There is no perivalvular AI. Pulmonic Valve: There is mild pulmonic regurgitation. Tricuspid Valve: There is trivial tricuspid regurgitation. Aorta:  Mildly dilated ascending aorta at 39mm.   FINDINGS:   Left Ventricle: Left ventricular ejection fraction, by visual estimation, is 60 to 65%. The left ventricle has normal function. There is mildly increased left ventricular hypertrophy of the basal septum. Left ventricular diastolic parameters are  indeterminate. Elevated left ventricular end-diastolic pressure.   Right Ventricle: The right ventricular size is normal. No increase in right ventricular wall thickness. Global RV systolic function is has normal systolic function. The tricuspid regurgitant velocity is 2.07 m/s, and with an assumed right atrial pressure  of 3 mmHg, the estimated right ventricular systolic pressure is normal at 20.2 mmHg.   Left Atrium: Left atrial size was severely dilated.   Right Atrium: Right atrial size was normal in size   Pericardium: There is no evidence of pericardial effusion.   Mitral Valve: The mitral valve is normal in structure. There is moderate thickening of the anterior mitral valve leaflet(s). There is moderate calcification of the anterior mitral valve leaflet(s). Moderate mitral annular calcification. No evidence of  mitral valve stenosis by observation. MV peak gradient, 6.7 mmHg. Trace mitral valve regurgitation.   Tricuspid Valve: The tricuspid valve is normal in structure. Tricuspid valve regurgitation is mild.   Aortic Valve: The aortic valve is normal in structure. Aortic valve regurgitation is not visualized. The aortic valve is structurally normal, with no evidence of sclerosis or stenosis. Aortic valve mean gradient measures 10.0 mmHg. Aortic valve peak  gradient measures 18.7 mmHg. Aortic valve area, by VTI measures 1.32 cm.   Pulmonic Valve: The pulmonic valve was normal in structure. Pulmonic valve regurgitation is mild.   Aorta: Aortic dilatation noted. There is mild dilatation of the ascending aorta measuring 39 mm.   Venous: The inferior vena cava is normal in size with  greater than 50% respiratory variability, suggesting right atrial pressure of 3 mmHg.   IAS/Shunts: No atrial level shunt detected by color flow Doppler. No ventricular septal defect is seen or detected. There is no evidence of an atrial septal defect.       LEFT VENTRICLE PLAX 2D LVIDd:         4.12 cm  Diastology LVIDs:         3.08 cm  LV e' lateral:   11.20 cm/s LV PW:         0.97 cm  LV E/e' lateral: 9.1 LV IVS:        1.25 cm  LV e' medial:    5.98 cm/s LVOT diam:     2.00 cm  LV E/e' medial:  17.1 LV SV:         38 ml LV SV Index:   18.76 LVOT Area:     3.14 cm     RIGHT VENTRICLE RV Basal diam:  4.88 cm RV S prime:     12.50 cm/s TAPSE (M-mode): 2.5 cm RVSP:  20.2 mmHg   LEFT ATRIUM              Index       RIGHT ATRIUM           Index LA diam:        5.00 cm  2.52 cm/m  RA Pressure: 3.00 mmHg LA Vol (A2C):   95.6 ml  48.15 ml/m RA Area:     19.05 cm LA Vol (A4C):   101.0 ml 50.87 ml/m RA Volume:   50.85 ml  25.61 ml/m LA Biplane Vol: 101.0 ml 50.87 ml/m  AORTIC VALVE AV Area (Vmax):    1.22 cm AV Area (Vmean):   1.30 cm AV Area (VTI):     1.32 cm AV Vmax:           216.00 cm/s AV Vmean:          143.000 cm/s AV VTI:            0.516 m AV Peak Grad:      18.7 mmHg AV Mean Grad:      10.0 mmHg LVOT Vmax:         84.20 cm/s LVOT Vmean:        59.000 cm/s LVOT VTI:          0.217 m LVOT/AV VTI ratio: 0.42   AORTA Ao Root diam: 3.50 cm   MITRAL VALVE                         TRICUSPID VALVE MV Area (PHT): 2.80 cm              TR Peak grad:   17.2 mmHg MV Peak grad:  6.7 mmHg              TR Vmax:        263.00 cm/s MV Mean grad:  2.0 mmHg              Estimated RAP:  3.00 mmHg MV Vmax:       1.29 m/s              RVSP:           20.2 mmHg MV Vmean:      67.4 cm/s MV VTI:        0.42 m                SHUNTS MV PHT:        78.59 msec            Systemic VTI:  0.22 m MV Decel Time: 271 msec              Systemic Diam: 2.00 cm MV E velocity:  102.00 cm/s 103 cm/s MV A velocity: 112.00 cm/s 70.3 cm/s MV E/A ratio:  0.91        1.5    LE Vascular US: 01/13/2020  Summary:  Right: Resting right ankle-brachial index indicates noncompressible right  lower extremity arteries. The right toe-brachial index is normal.   Left: Resting left ankle-brachial index indicates noncompressible left  lower extremity arteries. The left toe-brachial index is normal.    ECHO: 12/03/2020 IMPRESSIONS   1. Left ventricular ejection fraction, by estimation, is 60 to 65%. The  left ventricle has normal function. The left ventricle has no regional  wall motion abnormalities. There is moderate concentric left ventricular  hypertrophy. Left ventricular  diastolic parameters are consistent with Grade II diastolic dysfunction  (  pseudonormalization). Elevated left atrial pressure.   2. Right ventricular systolic function is normal. The right ventricular  size is normal.   3. Left atrial size was moderately dilated.   4. The mitral valve is normal in structure. Trivial mitral valve  regurgitation. No evidence of mitral stenosis.   5. The aortic valve has been repaired/replaced. Aortic valve  regurgitation is not visualized. No aortic stenosis is present. There is a  bioprosthetic valve present in the aortic position. Echo findings are  consistent with normal structure and function  of the aortic valve prosthesis. Aortic valve mean gradient measures 10.9  mmHg. Aortic valve Vmax measures 2.27 m/s. Aortic valve acceleration time  measures 92 msec.   6. There is borderline dilatation of the ascending aorta, measuring 37  mm.   7. The inferior vena cava is normal in size with greater than 50%  respiratory variability, suggesting right atrial pressure of 3 mmHg.   Comparison(s): No significant change from prior study.   ECHO: 06/27/2022 1. Left ventricular ejection fraction, by estimation, is 55 to 60%. The  left ventricle has normal function. The left  ventricle has no regional  wall motion abnormalities. There is mild concentric left ventricular  hypertrophy. Left ventricular diastolic  parameters are consistent with Grade II diastolic dysfunction  (pseudonormalization).   2. Right ventricular systolic function is normal. The right ventricular  size is mildly enlarged. There is normal pulmonary artery systolic  pressure. The estimated right ventricular systolic pressure is 20.6 mmHg.   3. Left atrial size was moderately dilated.   4. The mitral valve is degenerative. Trivial mitral valve regurgitation.  No evidence of mitral stenosis. Moderate mitral annular calcification.   5. Bioprosthetic aortic valve replacement. Mean gradient 7 mmHg, no  significant stenosis. No significant peri-valvular leakage noted.   6. Aortic dilatation noted. There is mild dilatation of the ascending  aorta, measuring 37 mm.   7. The inferior vena cava is normal in size with greater than 50%  respiratory variability, suggesting right atrial pressure of 3 mmHg.    CORONARY CTA: 11/18/2022 FINDINGS: A 100 kV prospective scan was triggered in the descending thoracic aorta at 111 HU's. Axial non-contrast 3 mm slices were carried out through the heart. The data set was analyzed on a dedicated work station and scored using the Agatson method. Gantry rotation speed was 250 msecs and collimation was .6 mm. No beta blockade and 0.8 mg of sl NTG was given. The 3D data set was reconstructed in 5% intervals of the 35-75% of the R-R cycle. Phases were analyzed on a dedicated work station using MPR, MIP and VRT modes. The patient received 100 cc of contrast.   Coronary Arteries:  Normal coronary origin.  Left dominance.   RCA is a small nondominant artery. Calcified plaque in proximal RCA causes 25-49% stenosis. Calcified plaque in acute marginal branch of RCA causes 50-69% stenosis.   Left main is a large artery that gives rise to LAD and LCX arteries. Calcified  plaque in left main causes 0-24% stenosis.   LAD is a large vessel that has no plaque. Calcified plaque in proximal LAD causes 25-49% stenosis. Calcified plaque in mid LAD causes 50-69% stenosis. Calcified plaque in distal LAD causes 50-69% stenosis   LCX is a dominant artery. Calcified plaque in proximal LCX causes 24-49% stenosis. Calcified plaque in mid LCX causes 50-69% stenosis. Calcified plaque in distal LCX causes 24-49% stenosis. Calcified plaque in OM2 causes 25-49% stenosis   Other findings:  Left Ventricle: Normal size   Left Atrium: Normal size   Pulmonary Veins: Normal configuration   Right Ventricle: Moderate enlargement   Right Atrium: Mild enlargement   Cardiac valves: S/p bioprosthetic AVR   Thoracic aorta: Dilated ascending aorta measuring 39mm   Pulmonary Arteries: Normal size   Systemic Veins: Normal drainage   Pericardium: Normal thickness   IMPRESSION: 1. Coronary calcium score of 4318. This was 95th percentile for age and sex matched control.   2. Normal coronary origin with left dominance.   3. Diffuse severe coronary calcifications, which makes it difficult to determine severity of coronary stenosis due to blooming artifact. Appears to be regions of moderate stenosis, will send study for CTFFR   4. Dilated ascending aorta measuring 39mm   5. S/p bioprosthetic AVR   CAD-RADS 3. Moderate stenosis. Consider symptom-guided anti-ischemic pharmacotherapy as well as risk factor modification per guideline directed care. Additional analysis with CT FFR will be submitted.   Electronically Signed: By: Epifanio Lesches M.D. On: 11/18/2022 19:23  FFR:  FINDINGS: FFRct analysis was performed on the original cardiac CT angiogram dataset. Diagrammatic representation of the FFRct analysis is provided in a separate PDF document in PACS. This dictation was created using the PDF document and an interactive 3D model of the results. 3D model is  not available in the EMR/PACS. Normal FFR range is >0.80.   1. Left Main: No significant stenosis   2. LAD: CTFFR 0.92 across lesion in proximal to mid LAD, suggesting lesion is not functionally significant. CTFFR gradually tapers down the vessel, dropping to 0.79 across lesion in distal LAD, with change in FFR across lesion not suggestive of functional significance   3. LCX: CTFFR 0.95 across lesion in mid LCX, suggesting lesion is not functionally significant. CTFFR 0.94 across lesion in OM2, suggesting no functional significance   4. RCA: CTFFR 0.95 across lesion in proximal RCA, suggesting lesion is not functionally significant. CTFFR 0.83 across lesion in AM branch, suggesting no functional significance   IMPRESSION: 1.  CTFFR suggests nonobstructive CAD  ASSESSMENT:    1. Coronary artery disease involving native coronary artery of native heart without angina pectoris   2. History of severe aortic stenosis   3. S/P AVR (aortic valve replacement)   4. Coronary artery calcification seen on CAT scan: 4318 Agatston units (95th %); FFR without hemodynamic significance   5. Essential hypertension   6. Hyperlipidemia with target low density lipoprotein (LDL) cholesterol less than 55 mg/dL   7. First degree AV block   8. Parkinson's disease without dyskinesia or fluctuating manifestations (HCC)   9. Medication management     PLAN:  Drew Andrews is a 77 year-old gentleman who developed severe aortic stenosis and underwent successful AVR with a 25 mm [facility] Ease bovine pericardial tissue valve on May 13, 2013.  He has a history of hypertension and hyperlipidemia.  On his echo Doppler study of March 14, 2019 his bioprosthetic valve remained stable with normal gradients and without evidence for perivalvular leak.  EF was 60 to 65% with mild left ventricular hypertrophy of the basal septum.  He had moderate mitral annular calcification with trivial MR, mild PR and his  ascending aorta measured 39 mm.  A follow-up echo on December 03, 2020 remained stable and showed normal LV function with EF 60 to 65%.  There was moderate concentric LVH with grade 2 diastolic dysfunction and moderate left atrial dilatation.  His aortic valve gradients were stable with a mean  gradient of 10.9 and peak gradient of 20.5.  His most recent echo from June 27, 2022 showed EF 55 to 60% with grade 2 diastolic dysfunction.  His mean bioprosthetic aortic gradient was 7 mm.  There is no perivalvular leak.  There was mild dilation of ascending aorta at 37 mm.  At my evaluation in March 2024, his posture was highly suggestive of Parkinson's disease with left hand tremor and without definitive cogwheel rigidity.  He has subsequently been diagnosed with Parkinson's disease who is now followed by Dr. Terrace Arabia of neurology and is on carbidopa levodopa 25/100 mg 3 times a day.  He admits to more fatigability.  I reviewed his most recent coronary CTA which showed markedly elevated calcium score at 4318 with multivessel coronary calcification with FFR analysis suggesting nonobstructive disease.  Presently is without chest pain.  I have recommended more aggressive lipid management and will therefore discontinue Vytorin 10/40 and in its place start Zetia 10 mg and initiate rosuvastatin 20 mg with target LDL less than 55.  I will also check LP(a) today and if elevated I will strive for LDL less than 50.  I recommended follow-up c-Met and lipid panel in 3 months.  I will see him in 4 months for reevaluation or sooner as needed.   Medication Adjustments/Labs and Tests Ordered: Current medicines are reviewed at length with the patient today.  Concerns regarding medicines are outlined above.  Medication changes, Labs and Tests ordered today are listed in the Patient Instructions below. Patient Instructions  Medication Instructions:  STOP the ezetimibe-simvastatin (VYTORIN) 10-40 MG tablet.  BEGIN Zetia 10mg . Take one  tablet daily.  BEGIN Rosuvastatin 20mg . Take one tablet daily.  *If you need a refill on your cardiac medications before your next appointment, please call your pharmacy*   Lab Work: [facility]TODAY.   In 3 months, you will return for fasting labs  If you have labs (blood work) drawn today and your tests are completely normal, you will receive your results only by: MyChart Message (if you have MyChart) OR A paper copy in the mail If you have any lab test that is abnormal or we need to change your treatment, we will call you to review the results.   Testing/Procedures: No testing ordered   Follow-Up: At [facility], you and your health needs are our priority.  As part of our continuing mission to provide you with exceptional heart care, we have created designated Provider Care Teams.  These Care Teams include your primary Cardiologist (physician) and Advanced Practice Providers (APPs -  Physician Assistants and Nurse Practitioners) who all work together to provide you with the care you need, when you need it.  We recommend signing up for the patient portal called "MyChart".  Sign up information is provided on this After Visit Summary.  MyChart is used to connect with patients for Virtual Visits (Telemedicine).  Patients are able to view lab/test results, encounter notes, upcoming appointments, etc.  Non-urgent messages can be sent to your provider as well.   To learn more about what you can do with MyChart, go to [email or web address].    Your next appointment:   4 month(s)  Provider:   Nicki Guadalajara, MD        Signed, Nicki Guadalajara, MD  04/12/2023 5:42 PM    [facility] [address], [place], [place]  [address] Phone: [phone]

## 2023-04-11 NOTE — Patient Instructions (Addendum)
Medication Instructions:  STOP the ezetimibe-simvastatin (VYTORIN) 10-40 MG tablet.  BEGIN Zetia 10mg . Take one tablet daily.  BEGIN Rosuvastatin 20mg . Take one tablet daily.  *If you need a refill on your cardiac medications before your next appointment, please call your pharmacy*   Lab Work: LPaTODAY.   In 3 months, you will return for fasting labs  If you have labs (blood work) drawn today and your tests are completely normal, you will receive your results only by: MyChart Message (if you have MyChart) OR A paper copy in the mail If you have any lab test that is abnormal or we need to change your treatment, we will call you to review the results.   Testing/Procedures: No testing ordered   Follow-Up: At [facility], you and your health needs are our priority.  As part of our continuing mission to provide you with exceptional heart care, we have created designated Provider Care Teams.  These Care Teams include your primary Cardiologist (physician) and Advanced Practice Providers (APPs -  Physician Assistants and Nurse Practitioners) who all work together to provide you with the care you need, when you need it.  We recommend signing up for the patient portal called "MyChart".  Sign up information is provided on this After Visit Summary.  MyChart is used to connect with patients for Virtual Visits (Telemedicine).  Patients are able to view lab/test results, encounter notes, upcoming appointments, etc.  Non-urgent messages can be sent to your provider as well.   To learn more about what you can do with MyChart, go to [email or web address].    Your next appointment:   4 month(s)  Provider:   Nicki Guadalajara, MD

## 2023-04-12 ENCOUNTER — Encounter: Payer: Self-pay | Admitting: Cardiovascular Disease

## 2023-04-13 LAB — LIPOPROTEIN A (LPA): Lipoprotein (a): 217.6 nmol/L — ABNORMAL HIGH (ref <75.0)

## 2023-04-17 ENCOUNTER — Ambulatory Visit: Payer: Medicare HMO | Admitting: Neurology

## 2023-04-17 ENCOUNTER — Encounter: Payer: Self-pay | Admitting: Neurology

## 2023-04-17 ENCOUNTER — Telehealth: Payer: Self-pay | Admitting: Neurology

## 2023-04-17 VITALS — BP 159/79 | HR 71 | Resp 15 | Ht 69.5 in | Wt 170.5 lb

## 2023-04-17 DIAGNOSIS — F419 Anxiety disorder, unspecified: Secondary | ICD-10-CM | POA: Diagnosis not present

## 2023-04-17 DIAGNOSIS — G20A1 Parkinson's disease without dyskinesia, without mention of fluctuations: Secondary | ICD-10-CM

## 2023-04-17 DIAGNOSIS — R4189 Other symptoms and signs involving cognitive functions and awareness: Secondary | ICD-10-CM

## 2023-04-17 MED ORDER — TRAZODONE HCL 50 MG PO TABS: 100.0000 mg | ORAL_TABLET | Freq: Every day | ORAL | 11 refills | Status: DC

## 2023-04-17 NOTE — Progress Notes (Addendum)
 Chief Complaint  Patient presents with   Follow-up    Rm14, wife present, MOCa:24, Parkinson:intermittent, worse at times      ASSESSMENT AND PLAN  Drew Andrews is a 77 y.o. male   Parkinson's disease Cognitive impairment  MRI of the brain without contrast showed mild age-related changes  Laboratory evaluation showed no treatable etiology  He responded well Sinemet 25/100 mg 3 times a day Chronic insomnia  He was taking frequent Benadryl, which has cholinergic side effect, will try low-dose trazodone 50 mg titrating to 100 mg at night for sleep  He was drinking excessive coffee, encouraged him to decrease the caffeine intake  Return To Clinic   In 6 Months with nurse practitioner  DIAGNOSTIC DATA (LABS, IMAGING, TESTING) - I reviewed patient records, labs, notes, testing and imaging myself where available.   MEDICAL HISTORY:  Drew Andrews is a 77 year old male, accompanied his wife, seen in request by cardiologist PA Azalee Course, for evaluation of Parkinson's, primary care physician is Dr. Wylene Simmer, Adelfa Koh, initial evaluation January 12, 2023  I reviewed and summarized the referring note.PMHX S/p Aortic valve replacement in 2015. HTN HLD  Alcohol  He used to be active, over the past couple years, noticed gradually decline, loss sense of smell, wife also reported he talking out of dream, he is a retired Art gallery manager, noticed mild memory loss,  Was noted to have some Parkinson features by primary care physician Dr.Tisovec in 2023, he wants to put it off, worsening symptoms since 2024, could no longer finish his daily walking, loss of appetite with weight loss over past few months, worsening right hand tremor, gait abnormality, he used to be easygoing, now very anxious about his slow decline  UPDATE Dec 16th 2024: He is accompanied by wife at today's clinical visit, tolerating Sinemet 25/100 mg 3 times daily at 7 AM 12 and 5 PM, wife reported better  movement the last hand shaking,  However since starting the medicine he noticed multitude of other symptoms such as worsening constipation, feeling cold, fatigue, bilateral behind the knee achy pain, occasionally nosebleeding, worsening stomach discomfort, he suspicious those are due to side effect of the Sinemet  He is drink 6 to 7 cups of coffee daily,, as needed to help his abdominal complaints,  He also complains of difficulty sleeping, often wake up at 2 AM, take extra dose of Benadryl  Personally reviewed MRI of the brain without contrast February 16, 2023, mild to moderate generalized atrophy, few scattered small vessel disease  Lab in 2024 normal B12 TSH negative RPR  PHYSICAL EXAM:   Vitals:   04/17/23 1459  BP: (!) 159/79  Pulse: 71  Resp: 15  Weight: 170 lb 8 oz (77.3 kg)  Height: 5' 9.5" (1.765 m)   Body mass index is 24.82 kg/m.  PHYSICAL EXAMNIATION:  Gen: NAD, conversant, well nourised, well groomed                     Cardiovascular: Regular rate rhythm, no peripheral edema, warm, nontender. Eyes: Conjunctivae clear without exudates or hemorrhage Neck: Supple, no carotid bruits. Pulmonary: Clear to auscultation bilaterally   NEUROLOGICAL EXAM:  MENTAL STATUS: Speech/cognition: Microphonia, decreased facial expression, masked face    04/17/2023    3:00 PM 01/12/2023    2:00 PM  Montreal Cognitive Assessment   Visuospatial/ Executive (0/5) 5 3  Naming (0/3) 3 3  Attention: Read list of digits (0/2) 2 2  Attention: Read list of  letters (0/1) 1 1  Attention: Serial 7 subtraction starting at 100 (0/3) 2 3  Language: Repeat phrase (0/2) 2 2  Language : Fluency (0/1) 1 1  Abstraction (0/2) 2 2  Delayed Recall (0/5) 0 1  Orientation (0/6) 6 5  Total 24 23    CRANIAL NERVES: CN II: Visual fields are full to confrontation. Pupils are round equal and briskly reactive to light. CN III, IV, VI: extraocular movement are normal. No ptosis. CN V: Facial  sensation is intact to light touch CN VII: Face is symmetric with normal eye closure  CN VIII: Hearing is normal to causal conversation. CN IX, X: Phonation is normal. CN XI: Head turning and shoulder shrug are intact  MOTOR: Mild right hand resting tremor, right more than left rigidity, bradykinesia  REFLEXES: Reflexes are 1 and symmetric at the biceps, triceps, knees, and ankles.    SENSORY: Intact to light touch and vibratory sensation are intact in fingers and toes.  COORDINATION: There is no trunk or limb dysmetria noted.  GAIT/STANCE: Get up from seated position arm crossed, slight decreased right arm swing, good moderate stride, slight en bloc turning  REVIEW OF SYSTEMS:  Full 14 system review of systems performed and notable only for as above All other review of systems were negative.   ALLERGIES: Allergies  Allergen Reactions   Scopolamine     seizure    HOME MEDICATIONS: Current Outpatient Medications  Medication Sig Dispense Refill   amLODipine (NORVASC) 5 MG tablet Take 1 tablet (5 mg total) by mouth daily. 90 tablet 3   aspirin 81 MG tablet Take 81 mg by mouth daily.     carbidopa-levodopa (SINEMET IR) 25-100 MG tablet Take 1 tablet by mouth 3 (three) times daily. 90 tablet 6   carvedilol (COREG) 6.25 MG tablet Take 1 tablet (6.25 mg total) by mouth 2 (two) times daily. 180 tablet 3   diphenhydrAMINE (BENADRYL) 25 MG tablet Take 25 mg by mouth every 6 (six) hours as needed.     ezetimibe (ZETIA) 10 MG tablet Take 1 tablet (10 mg total) by mouth daily. 90 tablet 3   lisinopril (ZESTRIL) 40 MG tablet Take 1 tablet (40 mg total) by mouth daily. 90 tablet 3   omeprazole (PRILOSEC OTC) 20 MG tablet Take 20 mg by mouth daily.     rosuvastatin (CRESTOR) 20 MG tablet Take 1 tablet (20 mg total) by mouth daily. 90 tablet 3   tamsulosin (FLOMAX) 0.4 MG CAPS capsule Take 0.4 mg by mouth daily.     No current facility-administered medications for this visit.    PAST  MEDICAL HISTORY: Past Medical History:  Diagnosis Date   Bursitis of elbow 05/20/2013   Essential hypertension 03/12/2013   GERD (gastroesophageal reflux disease)    Hyperlipidemia    Hypertension    Left ventricular diastolic dysfunction, NYHA class 2 03/12/2013   LVH (left ventricular hypertrophy)    with aortic stenosis-bicuspid   PONV (postoperative nausea and vomiting)    as a child   S/P AVR (aortic valve replacement) 05/13/2013   S/P AVR, 05/13/13, 25 mm Edwards Magna-Ease pericardial valve 05/13/2013   Seasonal allergies    Severe aortic stenosis 03/12/2013    PAST SURGICAL HISTORY: Past Surgical History:  Procedure Laterality Date   AORTIC VALVE REPLACEMENT N/A 05/13/2013   Procedure: AORTIC VALVE REPLACEMENT (AVR);  Surgeon: Alleen Borne, MD;  Location[facility] OR;  Service: Open Heart Surgery;  Laterality: N/A;   APPENDECTOMY  age  15   CARDIAC CATHETERIZATION     COLONOSCOPY     X 2   HIATAL HERNIA REPAIR     INTRAOPERATIVE TRANSESOPHAGEAL ECHOCARDIOGRAM N/A 05/13/2013   Procedure: INTRAOPERATIVE TRANSESOPHAGEAL ECHOCARDIOGRAM;  Surgeon: Alleen Borne, MD;  Location: [facility] OR;  Service: Open Heart Surgery;  Laterality: N/A;   LEFT HEART CATHETERIZATION WITH CORONARY ANGIOGRAM N/A 03/15/2013   Procedure: LEFT HEART CATHETERIZATION WITH CORONARY ANGIOGRAM;  Surgeon: Lennette Bihari, MD;  Location: [facility] CATH LAB;  Service: Cardiovascular;  Laterality: N/A;   TONSILLECTOMY     TRANSTHORACIC ECHOCARDIOGRAM  03/04/2013   EF 55-60%, grade 2 diastolic dysfunction, AV with mod calcified leaflets & mild regurg, calcified MV annulus, LA mod dilated, RV mildly dilated    FAMILY HISTORY: Family History  Problem Relation Age of Onset   Hypertension Mother    Hyperlipidemia Mother    Diabetes Mother    Heart attack Father    Acute myelogenous leukemia Brother    Pneumonia Maternal Grandmother    Heart attack Maternal Grandfather    Colon cancer Neg Hx    Stomach cancer Neg Hx      SOCIAL HISTORY: Social History   Socioeconomic History   Marital status: Married    Spouse name: Not on file   Number of children: 1   Years of education: college   Highest education level: Not on file  Occupational History   Not on file  Tobacco Use   Smoking status: Former    Current packs/day: 0.00    Types: Cigarettes    Quit date: 02/10/1974    Years since quitting: 49.2   Smokeless tobacco: Never  Substance and Sexual Activity   Alcohol use: Yes    Comment: Drinks 1 bottle of wine daily and 1 beer   Drug use: No   Sexual activity: Not on file  Other Topics Concern   Not on file  Social History Narrative   Not on file   Social Drivers of Health   Financial Resource Strain: Not on file  Food Insecurity: Not on file  Transportation Needs: Not on file  Physical Activity: Not on file  Stress: Not on file  Social Connections: Not on file  Intimate Partner Violence: Not on file      Levert Feinstein, M.D. Ph.D.  [facility] [address] [place], [place] [address] Ph: [phone] Fax: [phone]  CC:  Tisovec, Adelfa Koh, MD [address] [place],  [place] [address]  Tisovec, Adelfa Koh, MD

## 2023-04-17 NOTE — Telephone Encounter (Signed)
 Pt called to confirm appointment details

## 2023-04-18 ENCOUNTER — Telehealth: Payer: Self-pay

## 2023-04-19 ENCOUNTER — Telehealth: Payer: Self-pay | Admitting: Cardiovascular Disease

## 2023-04-19 MED ORDER — ROSUVASTATIN CALCIUM 40 MG PO TABS: 40.0000 mg | ORAL_TABLET | Freq: Every day | ORAL | 3 refills | Status: DC

## 2023-04-19 NOTE — Telephone Encounter (Signed)
Patient is calling to talk with Dr. Tresa Endo or nurse in regards to [facility] message. Please call back

## 2023-04-19 NOTE — Telephone Encounter (Signed)
Called and spoke to patient. Verified name and DOB. Patient is calling about a letter he received in the mail concerning medication changes. Patient reports he is not taking Vytoren any longer and is already taking Zetia 10 mg.  He also would like a new prescription for Rosuvastatin 40 mg sent to [facility]. Will send in new prescription. Patient will take 2 of the Rosuvastatin 20 mg until he runs out because he just received an 90 day supply from on 12/11.

## 2023-05-01 NOTE — Telephone Encounter (Signed)
Acknowledged.

## 2023-05-09 ENCOUNTER — Other Ambulatory Visit: Payer: Self-pay | Admitting: Neurology

## 2023-05-09 NOTE — Telephone Encounter (Signed)
Rx refilled for 90 day supply. 

## 2023-06-12 ENCOUNTER — Other Ambulatory Visit: Payer: Self-pay | Admitting: Cardiovascular Disease

## 2023-06-12 ENCOUNTER — Other Ambulatory Visit: Payer: Self-pay

## 2023-06-12 DIAGNOSIS — I440 Atrioventricular block, first degree: Secondary | ICD-10-CM

## 2023-06-12 MED ORDER — EZETIMIBE 10 MG PO TABS
10.0000 mg | ORAL_TABLET | Freq: Every day | ORAL | 3 refills | Status: DC
Start: 2023-06-12 — End: 2023-08-09

## 2023-06-12 MED ORDER — ROSUVASTATIN CALCIUM 40 MG PO TABS: 40.0000 mg | ORAL_TABLET | Freq: Every day | ORAL | 3 refills | Status: DC

## 2023-06-26 ENCOUNTER — Other Ambulatory Visit: Payer: Self-pay | Admitting: Neurology

## 2023-06-26 NOTE — Telephone Encounter (Signed)
 Rx refilled per last office note

## 2023-06-28 ENCOUNTER — Telehealth: Payer: Self-pay | Admitting: Neurology

## 2023-06-28 DIAGNOSIS — M5440 Lumbago with sciatica, unspecified side: Secondary | ICD-10-CM

## 2023-06-28 DIAGNOSIS — F419 Anxiety disorder, unspecified: Secondary | ICD-10-CM

## 2023-06-28 DIAGNOSIS — R4189 Other symptoms and signs involving cognitive functions and awareness: Secondary | ICD-10-CM

## 2023-06-28 DIAGNOSIS — G20A1 Parkinson's disease without dyskinesia, without mention of fluctuations: Secondary | ICD-10-CM

## 2023-06-28 MED ORDER — GABAPENTIN 300 MG PO CAPS: 600.0000 mg | ORAL_CAPSULE | Freq: Every evening | ORAL | 11 refills | Status: DC | PRN

## 2023-06-28 NOTE — Telephone Encounter (Signed)
 Call to patient, who reports the last 6 weeks he has been having leg pain in the back of both legs from the knee up to the buttock. It is worse in the morning. He is taking 400-600 mg advil every morning. It helps him through the day but then in the evening. He doesn't take the advil at night because his wife tells him he takes it too much. After taking the advil he is able to exercise as normal. He wants to know if he can take the same dose of advil at night and wants to know if legs cramps are part of parkinson's.

## 2023-06-28 NOTE — Telephone Encounter (Signed)
 Patient said experiencing have a lot of trouble walking, and back of legs hurts. After taking Advil relieves the pain for the whole day until about 5 pm. Feel like the pain is getting worse. Does not hurt when sitting down or lying in the bed. Would like a call from the nurse.

## 2023-06-28 NOTE — Telephone Encounter (Signed)
 Call to patient and he is in agreement with Dr. Terrace Arabia recommendation to try gabapentin.

## 2023-06-28 NOTE — Telephone Encounter (Signed)
 Meds ordered this encounter  Medications   gabapentin (NEURONTIN) 300 MG capsule    Sig: Take 2 capsules (600 mg total) by mouth at bedtime as needed.    Dispense:  60 capsule    Refill:  11      Please let patient know I do agree he should not take too much over-the-counter Advil  I have added on gabapentin 300 mg he may take 1 to 2 tablets as needed for his nighttime low back pain, muscle cramps

## 2023-07-07 ENCOUNTER — Ambulatory Visit
Admission: RE | Admit: 2023-07-07 | Discharge: 2023-07-07 | Disposition: A | Discharge status: Home / Self Care | Source: Ambulatory Visit | Attending: Neurology | Admitting: Neurology

## 2023-07-07 DIAGNOSIS — M5440 Lumbago with sciatica, unspecified side: Secondary | ICD-10-CM

## 2023-07-07 DIAGNOSIS — F419 Anxiety disorder, unspecified: Secondary | ICD-10-CM

## 2023-07-07 DIAGNOSIS — M545 Low back pain, unspecified: Secondary | ICD-10-CM | POA: Diagnosis not present

## 2023-07-07 DIAGNOSIS — R4189 Other symptoms and signs involving cognitive functions and awareness: Secondary | ICD-10-CM

## 2023-07-07 DIAGNOSIS — G20A1 Parkinson's disease without dyskinesia, without mention of fluctuations: Secondary | ICD-10-CM

## 2023-07-07 MED ORDER — DULOXETINE HCL 30 MG PO CPEP: 30.0000 mg | ORAL_CAPSULE | Freq: Every day | ORAL | 11 refills | Status: DC

## 2023-07-07 NOTE — Telephone Encounter (Signed)
 I called patient, he has chronic low back pain for many years, now noticed increased leg pain at posterior upper thigh area,   X-ray of lumbar to [facility]. Cymbalta 30mg  daily

## 2023-07-07 NOTE — Telephone Encounter (Signed)
 Call to patient and he reports the gabapentin is not helping. He has taken Advil and tylenol and this morning and took 3 hours to relief. Pain is both legs, thigh area. He feels shooting pain as well. He is asking for a different medication or increase in dose or take more frequently. He reports being almost in tears due to the pain. Advised I will send to Dr. Terrace Arabia. He does report taking a lot of Advil and tylenol to alleviate the pain. He is aware we are only here half day and if needed he may need to go to urgent care

## 2023-07-07 NOTE — Telephone Encounter (Signed)
 Pt called back and stated that the leg pain is becoming unbearable and he would like to be advised on what else he can do to alleviate the pain.

## 2023-07-07 NOTE — Addendum Note (Signed)
 Addended by: Levert Feinstein on: 07/07/2023 10:04 AM   Modules accepted: Orders

## 2023-07-12 NOTE — Progress Notes (Signed)
 Results are good, no major findings.  Some degenerative changes noted.  Continue current plan. -VRP

## 2023-07-13 ENCOUNTER — Telehealth: Payer: Self-pay | Admitting: Neurology

## 2023-07-13 DIAGNOSIS — G8929 Other chronic pain: Secondary | ICD-10-CM | POA: Diagnosis not present

## 2023-07-13 DIAGNOSIS — M545 Low back pain, unspecified: Secondary | ICD-10-CM | POA: Diagnosis not present

## 2023-07-13 DIAGNOSIS — M5442 Lumbago with sciatica, left side: Secondary | ICD-10-CM | POA: Diagnosis not present

## 2023-07-13 DIAGNOSIS — M51361 Other intervertebral disc degeneration, lumbar region with lower extremity pain only: Secondary | ICD-10-CM | POA: Diagnosis not present

## 2023-07-13 DIAGNOSIS — G20A1 Parkinson's disease without dyskinesia, without mention of fluctuations: Secondary | ICD-10-CM | POA: Diagnosis not present

## 2023-07-13 DIAGNOSIS — M5441 Lumbago with sciatica, right side: Secondary | ICD-10-CM | POA: Diagnosis not present

## 2023-07-13 NOTE — Telephone Encounter (Signed)
 Pt is asking if Dr Terrace Arabia believes he needs to see an orthopedic specialist as a result of the findings from the x-ray, please call.

## 2023-07-13 NOTE — Telephone Encounter (Signed)
 Returned call to pt and relayed the following information from Dr. Lucia Gaskins:  No I cannot prescribe pain meds. Appears he has a lot of arthritis in his low back, Dr. Terrace Arabia ordered an xray of his low bacl but never examined him for this as he called about it so he would need to see Dr. Terrace Arabia again. She did give him gabapentin and cymbalta a few weeks ago. But we cannot prescribe pain meds, he can schedule follow up with Dr. Terrace Arabia, see his primary care for referral to orthopaedics/neurosurgery for injections possibly or proceed to ED if 9/10 in pain. So sorry for his pain   Pt stated that he would like a sooner appt w/yan. I will route to Dr. Terrace Arabia to see if he is willing to work him in prior to originally scheduled appt in June.

## 2023-07-13 NOTE — Telephone Encounter (Signed)
 No I cannot prescribe pain meds. Appears he has a lot of arthritis in his low back, Dr. Terrace Arabia ordered an xray of his low bacl but never examined him for this as he called about it so he would need to see Dr. Terrace Arabia again. She did give him gabapentin and cymbalta a few weeks ago. But we cannot prescribe pain meds, he can schedule follow up with Dr. Terrace Arabia, see his primary care for referral to orthopaedics/neurosurgery for injections possibly or proceed to ED if 9/10 in pain. So sorry for his pain

## 2023-07-13 NOTE — Telephone Encounter (Signed)
 Called and relayed the following results:  Results are good, no major findings.  Some degenerative changes noted.  Continue current plan. -VRP  After talking w/pt he stated that he is in pain bilateral upper legs (pain in legs: 9/10), upper right quadrant back pain (pain: 7/10) that shoots through legs more so left leg than right. Pt wants to know if he could be prescribed pain meds to alleviate his agonizing pain.   Will Route to Dr. Lucia Gaskins as the work in provider for today.

## 2023-07-24 DIAGNOSIS — I517 Cardiomegaly: Secondary | ICD-10-CM | POA: Diagnosis not present

## 2023-07-24 DIAGNOSIS — M7918 Myalgia, other site: Secondary | ICD-10-CM | POA: Diagnosis not present

## 2023-07-24 DIAGNOSIS — J189 Pneumonia, unspecified organism: Secondary | ICD-10-CM | POA: Diagnosis not present

## 2023-07-24 DIAGNOSIS — R0609 Other forms of dyspnea: Secondary | ICD-10-CM | POA: Diagnosis not present

## 2023-07-24 DIAGNOSIS — I130 Hypertensive heart and chronic kidney disease with heart failure and stage 1 through stage 4 chronic kidney disease, or unspecified chronic kidney disease: Secondary | ICD-10-CM | POA: Diagnosis not present

## 2023-07-24 DIAGNOSIS — I5032 Chronic diastolic (congestive) heart failure: Secondary | ICD-10-CM | POA: Diagnosis not present

## 2023-07-24 DIAGNOSIS — G4700 Insomnia, unspecified: Secondary | ICD-10-CM | POA: Diagnosis not present

## 2023-07-24 DIAGNOSIS — I350 Nonrheumatic aortic (valve) stenosis: Secondary | ICD-10-CM | POA: Diagnosis not present

## 2023-07-24 DIAGNOSIS — Z952 Presence of prosthetic heart valve: Secondary | ICD-10-CM | POA: Diagnosis not present

## 2023-07-24 DIAGNOSIS — G8929 Other chronic pain: Secondary | ICD-10-CM | POA: Diagnosis not present

## 2023-07-24 DIAGNOSIS — G20A1 Parkinson's disease without dyskinesia, without mention of fluctuations: Secondary | ICD-10-CM | POA: Diagnosis not present

## 2023-07-24 DIAGNOSIS — R0789 Other chest pain: Secondary | ICD-10-CM | POA: Diagnosis not present

## 2023-07-29 ENCOUNTER — Other Ambulatory Visit: Payer: Self-pay | Admitting: Neurology

## 2023-07-31 ENCOUNTER — Other Ambulatory Visit: Payer: Self-pay

## 2023-07-31 MED ORDER — LISINOPRIL 40 MG PO TABS: 40.0000 mg | ORAL_TABLET | Freq: Every day | ORAL | 2 refills | Status: DC

## 2023-08-01 DIAGNOSIS — R0609 Other forms of dyspnea: Secondary | ICD-10-CM | POA: Diagnosis not present

## 2023-08-01 DIAGNOSIS — R0602 Shortness of breath: Secondary | ICD-10-CM | POA: Diagnosis not present

## 2023-08-01 DIAGNOSIS — J189 Pneumonia, unspecified organism: Secondary | ICD-10-CM | POA: Diagnosis not present

## 2023-08-09 ENCOUNTER — Encounter: Payer: Self-pay | Admitting: Physician Assistant

## 2023-08-09 ENCOUNTER — Ambulatory Visit: Attending: Physician Assistant | Admitting: Physician Assistant

## 2023-08-09 VITALS — BP 120/70 | HR 66 | Ht 68.0 in | Wt 169.8 lb

## 2023-08-09 DIAGNOSIS — R072 Precordial pain: Secondary | ICD-10-CM | POA: Diagnosis not present

## 2023-08-09 DIAGNOSIS — I350 Nonrheumatic aortic (valve) stenosis: Secondary | ICD-10-CM | POA: Diagnosis not present

## 2023-08-09 DIAGNOSIS — J189 Pneumonia, unspecified organism: Secondary | ICD-10-CM | POA: Diagnosis not present

## 2023-08-09 DIAGNOSIS — I2510 Atherosclerotic heart disease of native coronary artery without angina pectoris: Secondary | ICD-10-CM | POA: Diagnosis not present

## 2023-08-09 DIAGNOSIS — R0602 Shortness of breath: Secondary | ICD-10-CM | POA: Diagnosis not present

## 2023-08-09 DIAGNOSIS — E7800 Pure hypercholesterolemia, unspecified: Secondary | ICD-10-CM

## 2023-08-09 DIAGNOSIS — R079 Chest pain, unspecified: Secondary | ICD-10-CM | POA: Diagnosis not present

## 2023-08-09 MED ORDER — LISINOPRIL 40 MG PO TABS: 40.0000 mg | ORAL_TABLET | Freq: Every day | ORAL | 3 refills | Status: AC

## 2023-08-09 MED ORDER — ROSUVASTATIN CALCIUM 40 MG PO TABS: 40.0000 mg | ORAL_TABLET | Freq: Every day | ORAL | 3 refills | Status: AC

## 2023-08-09 MED ORDER — EZETIMIBE 10 MG PO TABS
10.0000 mg | ORAL_TABLET | Freq: Every day | ORAL | 3 refills | Status: DC
Start: 2023-08-09 — End: 2024-01-16

## 2023-08-09 MED ORDER — AMLODIPINE BESYLATE 5 MG PO TABS: 5.0000 mg | ORAL_TABLET | Freq: Every day | ORAL | 3 refills | Status: DC

## 2023-08-09 MED ORDER — CARVEDILOL 6.25 MG PO TABS: 6.2500 mg | ORAL_TABLET | Freq: Two times a day (BID) | ORAL | 3 refills | Status: AC

## 2023-08-09 NOTE — Patient Instructions (Signed)
 Medication Instructions:  Your physician recommends that you continue on your current medications as directed. Please refer to the Current Medication list given to you today. BUT YOU CAN HOLD THE CRESTOR FOR 2 WEEKS, IF YOUR SYMPTOMS GO AWAY OR NOT, PLEASE LET us KNOW SO WE CAN MAKE THE MEDICATION CHANGE IN THE SYSTEM.  *If you need a refill on your cardiac medications before your next appointment, please call your pharmacy*  Lab Work: TODAY:  BMET & PRO BNP  If you have labs (blood work) drawn today and your tests are completely normal, you will receive your results only by: MyChart Message (if you have MyChart) OR A paper copy in the mail If you have any lab test that is abnormal or we need to change your treatment, we will call you to review the results.  Testing/Procedures: Your physician has requested that you have an echocardiogram. Echocardiography is a painless test that uses sound waves to create images of your heart. It provides your doctor with information about the size and shape of your heart and how well your heart's chambers and valves are working. This procedure takes approximately one hour. There are no restrictions for this procedure. Please do NOT wear cologne, perfume, aftershave, or lotions (deodorant is allowed). Please arrive 15 minutes prior to your appointment time.  Please note: We ask at that you not bring children with you during ultrasound (echo/ vascular) testing. Due to room size and safety concerns, children are not allowed in the ultrasound rooms during exams. Our front office staff cannot provide observation of children in our lobby area while testing is being conducted. An adult accompanying a patient to their appointment will only be allowed in the ultrasound room at the discretion of the ultrasound technician under special circumstances. We apologize for any inconvenience.   Follow-Up: At [facility], you and your health needs are our priority.   As part of our continuing mission to provide you with exceptional heart care, our providers are all part of one team.  This team includes your primary Cardiologist (physician) and Advanced Practice Providers or APPs (Physician Assistants and Nurse Practitioners) who all work together to provide you with the care you need, when you need it.  Your next appointment:   As scheduled  Provider:   Nicki Guadalajara, MD     We recommend signing up for the patient portal called "MyChart".  Sign up information is provided on this After Visit Summary.  MyChart is used to connect with patients for Virtual Visits (Telemedicine).  Patients are able to view lab/test results, encounter notes, upcoming appointments, etc.  Non-urgent messages can be sent to your provider as well.   To learn more about what you can do with MyChart, go to [email or web address].   Other Instructions       1st Floor: - Lobby - Registration  - Pharmacy  - Lab - Cafe  2nd Floor: - PV Lab - Diagnostic Testing (echo, CT, nuclear med)  3rd Floor: - Vacant  4th Floor: - TCTS (cardiothoracic surgery) - AFib Clinic - Structural Heart Clinic - Vascular Surgery  - Vascular Ultrasound  5th Floor: - [facility] (general and EP) - Clinical Pharmacy for coumadin, hypertension, lipid, weight-loss medications, and med management appointments    Valet parking services will be available as well.

## 2023-08-09 NOTE — Assessment & Plan Note (Signed)
 Lipoprotein a elevated at 218.  He needs aggressive management of lipids.  Goal LDL is <55.  Recently his Vytorin was switched to Zetia plus rosuvastatin.  As noted, I have asked him to hold rosuvastatin for 2 weeks to see if this helps any symptoms.  Otherwise continue rosuvastatin 40 mg daily and Zetia 10 mg daily.

## 2023-08-09 NOTE — Progress Notes (Signed)
 Cardiology Office Note:    Date:  08/09/2023  ID:  Drew Andrews, DOB 1945-07-15, MRN [id number] PCP: Drew Garbe, MD  [facility] Providers Cardiologist:  Drew Guadalajara, MD       Patient Profile:      Aortic stenosis s/p bioprosthetic AVR  in 05/2013 (Dr. Laneta Andrews) TTE 06/27/22: EF 55-60, no RWMA, mild LVH, Gr 2 DD, NL RVSF, NL PASP, RVSP 20.6, mod LAE, trivial MR, AVR  mean 7 mmHg (no AS or PVL), mild dilation ascending aorta (37 mm) Coronary artery disease  CCTA 11/18/22: CAC score 4318 (95th percentile); pRCA 25-49, AM 50-69; LM 0-24; pLAD 25-49, mLAD 50-69, dLAD 50-69; pLCx 24-49, mLCx 50-69, dLCx 24-49, OM2 25-49 >> FFR normal; ascending aorta 39 mm Dilated ascending aorta CT 10/2022: 39 mm Hypertension  Hyperlipidemia  Lp(a): 217.6  1o AV block GERD, hiatal hernia Parkinson's disease         Discussed the use of AI scribe software for clinical note transcription with the patient, who gave verbal consent to proceed.  History of Present Illness Drew Andrews is a 78 y.o. male who returns for evaluation of shortness of breath, chest pain. He was last seen by Dr. Tresa Endo in 04/2023. Lp(a) was noted to be 217. His Vytorin was changed to Zetia + Crestor 40. He was seen by his PCP recently and CXR demonstrated L basilar infiltrate. Antibiotics were started.   He is accompanied by Drew Andrews, his wife. He experiences chest discomfort described as a pressure, akin to 'somebody's stepping on it,' occurring when laying back with feet up. The discomfort sometimes is located in the lower abdomen and is a constant, mild pain, rated 1.5 to 2 out of 10. It does not prevent sleep. There is no correlation between chest discomfort and shortness of breath. Shortness of breath occurs with exertion, such as walking to the mailbox or doing light activities, leading to exhaustion. He notes that shortness of breath may have worsened over the past few months, but is unsure if it  is related to pneumonia. He was diagnosed with pneumonia after a chest x-ray showed a left basilar infiltrate and was treated with Levaquin and an albuterol inhaler.  He has noted a cough since starting the inhaler, but there was no cough prior. The pneumonia treatment has not improved his shortness of breath. Chest discomfort has been present since July 2024, with no worsening of symptoms since then.  He takes Nexium and omeprazole for acid reflux. He reports occasional dizziness upon standing quickly, but denies passing out. He experiences constipation, for which he takes Miralax, and denies vomiting, diarrhea, or bloody stools.   Review of Systems  Respiratory:  Positive for cough.   Gastrointestinal:  Positive for constipation. Negative for hematochezia and melena.  Genitourinary:  Negative for hematuria.  -See HPI    Studies Reviewed:   EKG Interpretation Date/Time:  Wednesday August 09 2023 14:47:58 EDT Ventricular Rate:  64 PR Interval:  224 QRS Duration:  100 QT Interval:  398 QTC Calculation: 410 R Axis:   28  Text Interpretation: Sinus rhythm with 1st degree A-V block When compared with ECG of 11-Apr-2023 11:03, No significant change was found Confirmed by Drew Andrews [phone]) on 08/09/2023 2:51:09 PM   Results LABS Potassium: 4.9 (11/14/2022) Creatinine: 1.2 (11/14/2022) Hemoglobin: 12.6 (11/14/2022) TSH: 1.03 (01/12/2023)    Risk Assessment/Calculations:             Physical Exam:   VS:  BP 120/70  Pulse 66   Ht 5\' 8"  (1.727 m)   Wt 169 lb 12.8 oz (77 kg)   SpO2 93%   BMI 25.82 kg/m    Wt Readings from Last 3 Encounters:  08/09/23 169 lb 12.8 oz (77 kg)  04/17/23 170 lb 8 oz (77.3 kg)  04/11/23 170 lb 6.4 oz (77.3 kg)    Constitutional:      Appearance: Healthy appearance. Not in distress.  Neck:     Vascular: JVD normal.  Pulmonary:     Breath sounds: No wheezing. Rales (faint, L base, cleared with deep breath) present.  Chest:     Chest wall: Not  tender to palpatation.  Cardiovascular:     Normal rate. Regular rhythm.     Murmurs: There is a grade 1/6 systolic murmur at the URSB.     No rub.  Edema:    Peripheral edema absent.  Abdominal:     Palpations: Abdomen is soft.     Tenderness: There is no abdominal tenderness.  Skin:    General: Skin is warm and dry.        Assessment and Plan:   Assessment & Plan Precordial chest pain He notes intermittent substernal chest pain mostly with lying down, described as a constant, non-excruciating pain rated 1.5 to 2 out of 10. Pain is sometimes felt in the lower abdomen.  CCTA in July 2024 did not demonstrate any significant extracardiac findings.  The symptoms been ongoing since then.  He does not have a rub on exam or diffuse ST elevation on EKG.  I do not think his symptoms are consistent with pericarditis.  Question of his symptoms could be related to acid reflux.  However, he is already taking 2 different PPIs without significant changes.  There is no tenderness to palpation on exam.  His chest symptoms are not consistent with angina.  He had moderate nonobstructive disease on CCTA last July.  His symptoms predate his pneumonia.  Therefore, I do not think that this is related.  Question if he could have a pericardial effusion that is contributing to his symptoms.  I will get an echocardiogram to assess for this.  I will also obtain a BNP to assess for heart failure.  Given his global symptoms, question if he may be having side effects to statin therapy.  I have suggested that we hold his rosuvastatin for 2 weeks to see if this makes a difference. - Order echocardiogram to rule out pericardial effusion and assess for structural heart disease - Obtain BMET, BNP - Consider referral to gastroenterologist for further evaluation of acid reflux - Hold Crestor for two weeks to assess if symptoms improve SOB (shortness of breath) He has had shortness of breath on exertion for months, with no clear  correlation to chest pain. Previous CT scan showed moderate non-obstructive coronary artery disease. Echocardiogram in February 2024 showed normal EF with normally functioning AVR.  - Order echocardiogram to reevaluate AVR, rule out structural heart disease - Obtain BMET, BNP Severe aortic stenosis Status post bioprosthetic aortic valve replacement in January 2015. Last echocardiogram in February 2024 showed normal structure and function of the aortic valve replacement with a mean gradient of 7 and no paravalvular regurgitation.  - Order echocardiogram to assess valve function Coronary artery disease involving native coronary artery of native heart without angina pectoris Coronary artery disease with a calcium score of 4318, placing him in the 95th percentile. Moderate non-obstructive disease noted on CCTA.  His chest symptoms  with lying flat have been ongoing for months without significant change.  At this point, I do not think he needs further ischemic testing. - Continue aspirin 81 mg daily, carvedilol 6.25 mg twice daily, lisinopril 40 mg daily - Hold rosuvastatin x 2 weeks.  He can resume rosuvastatin 40 mg daily if there is no change in his symptoms.  Otherwise he should call. - Keep follow up with Dr. Tresa Endo in May as planned Pure hypercholesterolemia Lipoprotein a elevated at 218.  He needs aggressive management of lipids.  Goal LDL is <55.  Recently his Vytorin was switched to Zetia plus rosuvastatin.  As noted, I have asked him to hold rosuvastatin for 2 weeks to see if this helps any symptoms.  Otherwise continue rosuvastatin 40 mg daily and Zetia 10 mg daily. Pneumonia of left lower lobe due to infectious organism Recently treated by primary care with levofloxacin.  Follow-up with primary care as planned.      Dispo:  Return in about 4 weeks (around 09/06/2023) for Scheduled Follow Up w/ Dr. Tresa Endo.  Signed, Drew Newcomer, PA-C

## 2023-08-09 NOTE — Assessment & Plan Note (Signed)
 Status post bioprosthetic aortic valve replacement in January 2015. Last echocardiogram in February 2024 showed normal structure and function of the aortic valve replacement with a mean gradient of 7 and no paravalvular regurgitation.  - Order echocardiogram to assess valve function

## 2023-08-09 NOTE — Assessment & Plan Note (Signed)
 Coronary artery disease with a calcium score of 4318, placing him in the 95th percentile. Moderate non-obstructive disease noted on CCTA.  His chest symptoms with lying flat have been ongoing for months without significant change.  At this point, I do not think he needs further ischemic testing. - Continue aspirin 81 mg daily, carvedilol 6.25 mg twice daily, lisinopril 40 mg daily - Hold rosuvastatin x 2 weeks.  He can resume rosuvastatin 40 mg daily if there is no change in his symptoms.  Otherwise he should call. - Keep follow up with Dr. Tresa Endo in May as planned

## 2023-08-10 ENCOUNTER — Telehealth: Payer: Self-pay

## 2023-08-10 DIAGNOSIS — Z79899 Other long term (current) drug therapy: Secondary | ICD-10-CM

## 2023-08-10 DIAGNOSIS — I10 Essential (primary) hypertension: Secondary | ICD-10-CM

## 2023-08-10 DIAGNOSIS — I2510 Atherosclerotic heart disease of native coronary artery without angina pectoris: Secondary | ICD-10-CM

## 2023-08-10 DIAGNOSIS — I350 Nonrheumatic aortic (valve) stenosis: Secondary | ICD-10-CM

## 2023-08-10 LAB — BASIC METABOLIC PANEL WITH GFR
BUN/Creatinine Ratio: 12 (ref 10–24)
BUN: 13 mg/dL (ref 8–27)
CO2: 23 mmol/L (ref 20–29)
Calcium: 9 mg/dL (ref 8.6–10.2)
Chloride: 102 mmol/L (ref 96–106)
Creatinine, Ser: 1.1 mg/dL (ref 0.76–1.27)
Glucose: 88 mg/dL (ref 70–99)
Potassium: 4.3 mmol/L (ref 3.5–5.2)
Sodium: 141 mmol/L (ref 134–144)
eGFR: 68 mL/min/{1.73_m2} (ref >59)

## 2023-08-10 LAB — PRO B NATRIURETIC PEPTIDE: NT-Pro BNP: 510 pg/mL — ABNORMAL HIGH (ref 0–486)

## 2023-08-10 MED ORDER — FUROSEMIDE 20 MG PO TABS: ORAL_TABLET | ORAL | 3 refills | Status: DC

## 2023-08-10 NOTE — Telephone Encounter (Signed)
-----   Message from Drew Andrews sent at 08/10/2023  7:53 AM EDT ----- Result note sent to Shanon Brow Ferryman via MyChart. See comments below. PLAN:  -Start Lasix 20 mg on Mon, Wed, Fri only -BMET, NT-Pro BNP in 2 weeks  Mr. Kassner  Your creatinine (kidney function) and potassium ae normal. The congestive heart failure test (NT-Pro BNP) is below the cutoff for heart failure for your age group. Therefore, your test result is not really consistent with volume overload (excess fluid) causing your shortness of breath. However, your last echocardiogram did show diastolic dysfunction which is stiffness of your heart during relaxation which can lead to fluid excess. So, because of your symptoms of shortness of breath, I think it is reasonable to try a small amount of diuretic to see if it improves your symptoms. I am going to start you on Furosemide (Lasix) 20 mg on Mondays, Wednesdays and Fridays only. I will repeat some labs on you in a couple of weeks.  Drew Newcomer, PA-C    08/10/2023 7:39 AM

## 2023-08-10 NOTE — Telephone Encounter (Signed)
 Called and spoke to patient who agrees to plan. Lasix sent to pharmacy on file. Labs entered and released for him to have drawn in 2 weeks.

## 2023-08-21 DIAGNOSIS — R0609 Other forms of dyspnea: Secondary | ICD-10-CM | POA: Diagnosis not present

## 2023-08-21 DIAGNOSIS — I130 Hypertensive heart and chronic kidney disease with heart failure and stage 1 through stage 4 chronic kidney disease, or unspecified chronic kidney disease: Secondary | ICD-10-CM | POA: Diagnosis not present

## 2023-08-21 DIAGNOSIS — N1831 Chronic kidney disease, stage 3a: Secondary | ICD-10-CM | POA: Diagnosis not present

## 2023-08-21 DIAGNOSIS — I5032 Chronic diastolic (congestive) heart failure: Secondary | ICD-10-CM | POA: Diagnosis not present

## 2023-08-21 DIAGNOSIS — E7800 Pure hypercholesterolemia, unspecified: Secondary | ICD-10-CM | POA: Diagnosis not present

## 2023-08-21 DIAGNOSIS — N401 Enlarged prostate with lower urinary tract symptoms: Secondary | ICD-10-CM | POA: Diagnosis not present

## 2023-08-21 DIAGNOSIS — R0602 Shortness of breath: Secondary | ICD-10-CM | POA: Diagnosis not present

## 2023-08-21 DIAGNOSIS — Z952 Presence of prosthetic heart valve: Secondary | ICD-10-CM | POA: Diagnosis not present

## 2023-08-21 DIAGNOSIS — I350 Nonrheumatic aortic (valve) stenosis: Secondary | ICD-10-CM | POA: Diagnosis not present

## 2023-08-21 DIAGNOSIS — G20A1 Parkinson's disease without dyskinesia, without mention of fluctuations: Secondary | ICD-10-CM | POA: Diagnosis not present

## 2023-08-21 DIAGNOSIS — E663 Overweight: Secondary | ICD-10-CM | POA: Diagnosis not present

## 2023-08-21 DIAGNOSIS — M479 Spondylosis, unspecified: Secondary | ICD-10-CM | POA: Diagnosis not present

## 2023-08-21 LAB — PRO B NATRIURETIC PEPTIDE: EGFR: 64.6

## 2023-08-24 ENCOUNTER — Telehealth: Payer: Self-pay | Admitting: Cardiovascular Disease

## 2023-08-24 NOTE — Telephone Encounter (Signed)
 Left voice message for pt (per DPR) to have his PCP fax us  his lab results. It was also explained that the patient is fine to wait to have his echo on 5/14 but that I would check with Dr. Loetta Ringer to make sure this is ok. Will forward to Dr. Loetta Ringer and his covering for suggestions.

## 2023-08-24 NOTE — Telephone Encounter (Signed)
 Pt calling letting ys know PCP did labs and his BNP is 146. Pt wants to know if he needs to move up Echo appt. Please advise. Per pt it is ok to leave a detailed message

## 2023-08-26 LAB — BASIC METABOLIC PANEL WITH GFR
BUN/Creatinine Ratio: 21 (ref 10–24)
BUN: 25 mg/dL (ref 8–27)
CO2: 17 mmol/L — ABNORMAL LOW (ref 20–29)
Calcium: 8.6 mg/dL (ref 8.6–10.2)
Chloride: 105 mmol/L (ref 96–106)
Creatinine, Ser: 1.2 mg/dL (ref 0.76–1.27)
Glucose: 139 mg/dL — ABNORMAL HIGH (ref 70–99)
Potassium: 4.3 mmol/L (ref 3.5–5.2)
Sodium: 139 mmol/L (ref 134–144)
eGFR: 62 mL/min/{1.73_m2} (ref >59)

## 2023-08-26 LAB — PRO B NATRIURETIC PEPTIDE: NT-Pro BNP: 458 pg/mL (ref 0–486)

## 2023-08-31 NOTE — Telephone Encounter (Signed)
 Okay to have the patient echo on the current schedule date.

## 2023-08-31 NOTE — Telephone Encounter (Signed)
 Left message for patient (OK per DPR) informing him Dr. Loetta Ringer said it was OK to continue with echo as scheduled.  Provided office number for callback if any questions.

## 2023-09-13 ENCOUNTER — Ambulatory Visit (HOSPITAL_COMMUNITY): Attending: Cardiology

## 2023-09-13 DIAGNOSIS — I350 Nonrheumatic aortic (valve) stenosis: Secondary | ICD-10-CM | POA: Diagnosis not present

## 2023-09-13 DIAGNOSIS — R072 Precordial pain: Secondary | ICD-10-CM | POA: Diagnosis not present

## 2023-09-13 DIAGNOSIS — R0602 Shortness of breath: Secondary | ICD-10-CM | POA: Insufficient documentation

## 2023-09-13 DIAGNOSIS — I2510 Atherosclerotic heart disease of native coronary artery without angina pectoris: Secondary | ICD-10-CM | POA: Insufficient documentation

## 2023-09-13 DIAGNOSIS — E7800 Pure hypercholesterolemia, unspecified: Secondary | ICD-10-CM | POA: Diagnosis not present

## 2023-09-13 LAB — ECHOCARDIOGRAM COMPLETE
AV Mean grad: 7.8 mmHg
AV Peak grad: 14.2 mmHg
Ao pk vel: 1.88 m/s
Area-P 1/2: 2.52 cm2
S' Lateral: 2.7 cm

## 2023-09-14 ENCOUNTER — Ambulatory Visit: Payer: Medicare HMO | Attending: Cardiovascular Disease | Admitting: Cardiovascular Disease

## 2023-09-14 ENCOUNTER — Encounter: Payer: Self-pay | Admitting: Physician Assistant

## 2023-09-14 ENCOUNTER — Encounter: Payer: Self-pay | Admitting: Cardiovascular Disease

## 2023-09-14 ENCOUNTER — Ambulatory Visit: Payer: Self-pay | Admitting: Physician Assistant

## 2023-09-14 VITALS — BP 110/72 | HR 63 | Ht 69.5 in | Wt 172.0 lb

## 2023-09-14 DIAGNOSIS — G20A1 Parkinson's disease without dyskinesia, without mention of fluctuations: Secondary | ICD-10-CM

## 2023-09-14 DIAGNOSIS — E785 Hyperlipidemia, unspecified: Secondary | ICD-10-CM | POA: Diagnosis not present

## 2023-09-14 DIAGNOSIS — I350 Nonrheumatic aortic (valve) stenosis: Secondary | ICD-10-CM | POA: Diagnosis not present

## 2023-09-14 DIAGNOSIS — E7841 Elevated Lipoprotein(a): Secondary | ICD-10-CM | POA: Diagnosis not present

## 2023-09-14 DIAGNOSIS — I77810 Thoracic aortic ectasia: Secondary | ICD-10-CM

## 2023-09-14 DIAGNOSIS — K219 Gastro-esophageal reflux disease without esophagitis: Secondary | ICD-10-CM

## 2023-09-14 DIAGNOSIS — R0602 Shortness of breath: Secondary | ICD-10-CM

## 2023-09-14 DIAGNOSIS — I2510 Atherosclerotic heart disease of native coronary artery without angina pectoris: Secondary | ICD-10-CM | POA: Diagnosis not present

## 2023-09-14 DIAGNOSIS — I10 Essential (primary) hypertension: Secondary | ICD-10-CM

## 2023-09-14 DIAGNOSIS — Z952 Presence of prosthetic heart valve: Secondary | ICD-10-CM

## 2023-09-14 HISTORY — DX: Thoracic aortic ectasia: I77.810

## 2023-09-14 NOTE — Patient Instructions (Addendum)
 Medication Instructions:  No changes *If you need a refill on your cardiac medications before your next appointment, please call your pharmacy*  Lab Work: No labs  Testing/Procedures:    Please report to Radiology at the [facility] Main Entrance 30 minutes early for your test.  [address] [place], [place] [address]  How to Prepare for Your Cardiac PET/CT Stress Test:  Nothing to eat or drink, except water, 3 hours prior to arrival time.  NO caffeine/decaffeinated products, or chocolate 12 hours prior to arrival. (Please note decaffeinated beverages (teas/coffees) still contain caffeine).  If you have caffeine within 12 hours prior, the test will need to be rescheduled.  Medication instructions: Do not take erectile dysfunction medications for 72 hours prior to test (sildenafil, tadalafil) Do not take nitrates (isosorbide mononitrate, Ranexa) the day before or day of test Do not take tamsulosin the day before or morning of test  Diabetic Preparation: If able to eat breakfast prior to 3 hour fasting, you may take all medications, including your insulin . Do not worry if you miss your breakfast dose of insulin  - start at your next meal. If you do not eat prior to 3 hour fast-Hold all diabetes (oral and insulin ) medications. Patients who wear a continuous glucose monitor MUST remove the device prior to scanning.  You may take your remaining medications with water.  NO perfume, cologne or lotion on chest or abdomen area.  Total time is 1 to 2 hours; you may want to bring reading material for the waiting time.  IF YOU THINK YOU MAY BE PREGNANT, OR ARE NURSING PLEASE INFORM THE TECHNOLOGIST.  In preparation for your appointment, medication and supplies will be purchased.  Appointment availability is limited, so if you need to cancel or reschedule, please call the Radiology Department Scheduler at [phone] 24 hours in advance to avoid a cancellation fee of  $100.00  What to Expect When you Arrive:  Once you arrive and check in for your appointment, you will be taken to a preparation room within the Radiology Department.  A technologist or Nurse will obtain your medical history, verify that you are correctly prepped for the exam, and explain the procedure.  Afterwards, an IV will be started in your arm and electrodes will be placed on your skin for EKG monitoring during the stress portion of the exam. Then you will be escorted to the PET/CT scanner.  There, staff will get you positioned on the scanner and obtain a blood pressure and EKG.  During the exam, you will continue to be connected to the EKG and blood pressure machines.  A small, safe amount of a radioactive tracer will be injected in your IV to obtain a series of pictures of your heart along with an injection of a stress agent.    After your Exam:  It is recommended that you eat a meal and drink a caffeinated beverage to counter act any effects of the stress agent.  Drink plenty of fluids for the remainder of the day and urinate frequently for the first couple of hours after the exam.  Your doctor will inform you of your test results within 7-10 business days.  For more information and frequently asked questions, please visit our website: [email or web address]  For questions about your test or how to prepare for your test, please call: Cardiac Imaging Nurse Navigators Office: [phone]  Follow-Up: At [facility], you and your health needs are our priority.  As part of our  continuing mission to provide you with exceptional heart care, our providers are all part of one team.  This team includes your primary Cardiologist (physician) and Advanced Practice Providers or APPs (Physician Assistants and Nurse Practitioners) who all work together to provide you with the care you need, when you need it.  Your next appointment:   3 month(s)  Provider:   Jackquelyn Mass   We  recommend signing up for the patient portal called "MyChart".  Sign up information is provided on this After Visit Summary.  MyChart is used to connect with patients for Virtual Visits (Telemedicine).  Patients are able to view lab/test results, encounter notes, upcoming appointments, etc.  Non-urgent messages can be sent to your provider as well.   To learn more about what you can do with MyChart, go to [email or web address].

## 2023-09-14 NOTE — Progress Notes (Signed)
 Cardiology Office Note    Date:  09/16/2023   ID:  Drew Andrews June 03, 1945, MRN [id number]  PCP:  Suzzanne Estrin, MD  Cardiologist:  Drew Schuller, MD   6 month follow-up  History of Present Illness:  Drew Andrews is a 78 y.o. male who was first told of having a heart murmur back in the 1980s when he was planning to work at [employer] and had a physical exam. I have seen him since 2005 when he was referred to me by Drew Andrews for cardiac murmur. Since 2005 he had undergone follow-up  echo Doppler studies to assess his aortic valve. In 2005 his aortic transvalvular gradient was 37 with a mean gradient of 18. Over the last 9 years, his aortic valve murmur gradually become more significant and he remained asymptomatic.  An echo in 11/03/2014showed normal LV function with  grade 2 diastolic dysfunction. His aortic valve again was moderately calcified. The mean gradient had increased to 47 and peak instantaneous gradient 86 giving a calculated aortic valve area of 0.63 cm. He did have mitral annular calcification. His left atrium was moderately dilated. He did have very mild RV dilatation.   When I saw him in November 2014 Drew Andrews was continuing to exercise but there was new development of exertional shortness of breath. At that time, I strongly recommended cardiac catheterization and he underwent right and left heart cardiac catheterization on 03/15/2013. This confirmed severe aortic valve stenosis mild pulmonary hypertension. He had mild chronic calcification but nonobstructive 20% narrowing in the LAD.    He underwent successful aortic valve replacement surgery by Drew Andrews 05/13/2013 and had a 25 mm [facility] Ease bovine pericardial valve inserted. He has done remarkably well following his valve replacement surgery. He denies any episodes of palpitations. He has resumed activity.   Since his valve replacement, he has continued to feel  well.  He denies any shortness of breath or chest pain.  He does exercise regularly.  Typically in the cold winter months.  He likes to exercise at cardiac rehabilitation for the months of January and February..  However, he has had several rare occurrences of diplopia it are short lived.  He has been taking aspirin  81 mg daily.  A repeat echo Doppler study on 12/23/2013.revealed an EF 55-60% with mild LVH.  His aortic prosthesis was well-seated and open well.  There was only trivial aortic insufficiency.  He did have mild dilatation of his left atrium and mitral annular calcification.  There also was some dilatation of his RV.     His echo in October 2016 continued to show excellent LV function with an EF of 55-60% and he had normal diastolic parameters.  The bioprosthetic AV was  functioning normally.  The gradients were felt to be very minimally elevated and unchanged from his previous study with a mean gradient of 11 and peak gradient of 23 mm.  There was no evidence for aortic insufficiency.  There was evidence for mitral annular calcification and mild LA dilatation.   He underwent a 2-D echo Doppler study on 08/02/2016.  He had normal LV function with EF of 60-65% and grade 2 diastolic dysfunction.  His aortic bioprosthesis was well-seated and was functioning well.  The mean gradient was 11 and peak gradient 20.  He had mild MR.  There was mild TR and mild LA dilatation.     He participates the maintenance phase of cardiac rehabilitation  over the winter months and will be ending this week.  Typically over the summer he rides his bike.  On days he does not do cardiac rehabilitation in the winter he often walks and if the weather is nice.  He may ride his bike occasionally.  He typically gained some weight over the winter months but loses weight over the summer when he is more active outside.  He denies any chest pain, presyncope or syncope, palpitations, or change in exercise tolerance.  Recently, he has  noticed his blood pressure being slightly elevated in the 130s to 140s when he arrives at cardiac rehabilitation after exercise his blood pressure is normal.  He has increased his lisinopril  to 7.5 mg.    When I saw him in October 2018, he denied any episodes of chest pain or shortness of breath.  He was exercising and walks for at least 30 minutes or bikes 7 days per week.  Over the winter months, he again participated in the cardiac rehabilitation program.  His blood pressure was mildly elevated and I titrated lisinopril  to 15 mg daily.  He continues to take metoprolol  succinate 50 mg.  He has been on generic Vytorin  10/40 for hyperlipidemia.    He underwent a follow-up echo Doppler study on January 08, 2018.  This showed normal systolic function with grade 2 diastolic dysfunction.  EF was 55 to 60%.  His aortic bioprosthesis was well-seated.  Mean gradient was 12.  There was no AI.  His a sending aorta was mildly dilated.  There was mild MR and mild to moderate biatrial enlargement.  He continues to be asymptomatic and feels well.  He is enjoying retirement.     Since I saw him in December 2019, he again participated in the cardiac rehabilitation program over the winter months.  He was now back riding his bike at least 30 minutes a day and also on days that he does not ride his bike he typically walks at a fast pace.    I evaluated him in a telemedicine visit on Sep 11, 2018 which time he continued to be stable and denied any chest pain, PND, or orthopnea.  He was taking  lisinopril  20 mg and Toprol -XL 50 mg for hypertension.  His blood pressure has been well controlled and typically when he leaves cardiac rehab it would be approximately 120/70.  He continues to be on Vytorin  10/40 for hyperlipidemia.  He has a hiatal hernia and GERD which is well controlled with omeprazole.  His laboratory typically is done by Dr. Tisovec.  I saw him in December 2020 and since his prior evaluation he had remained  stable.  He was continuing  to ride his bike but in the winter months seems to walk more due to the weather.  Typically walks for 30 minutes for 2.2 miles.  He underwent a follow-up echo Doppler study on March 14, 2019.  EF remains 60 to 65% with mild LVH of the basal septum.  Diastolic function was indeterminate.  Had a stable bioprosthetic aortic valve with normal gradients with a mean gradient of 10 and peak gradient of 18.7.  There was no perivalvular aortic insufficiency.  He had mild pulmonic regurgitation.  His ascending aorta measured 39 mm.  He has continued to be on generic Vytorin  10/40 and lipid studies in August 2020 showed a total cholesterol 129, HDL 43, LDL 72, and triglycerides 70.  He continues to be on Toprol -XL 50 mg and lisinopril  20 mg daily.  GERD is controlled with omeprazole.    I saw him in July 2021 and  he was either walking or biking on a daily basis.   Due to the Covid pandemic he did not do cardiac rehab over the winter.  He had recently had an insurance person come to the house.  Apparently they had done ABIs and noticed his right ABI was reduced at 0.85 and left ABI was 1.24.  They were concerned about possible peripheral vascular disease.  He sees Dr. Tisovec who has checked laboratory.  He continues to be on generic Vytorin  10/40, lisinopril  20 mg daily, metoprolol  succinate 50 mg daily and takes omeprazole over-the-counter daily.  During that evaluation, his blood pressure was elevated and I recommended slight titration of lisinopril  from 20 mg up to 30 mg daily for more optimal blood pressure control.  Following his evaluation, he underwent lower extremity arterial Doppler study on January 13, 2020.  This revealed noncompressible right ankle brachial index and left ankle brachial index but normal right toe brachial and left toe brachial indices.  When I saw him in June 22, 2020 he continued to feel well and was without chest pain or shortness of breath.  He denied  any palpitations.. He sees Dr. Tisovec who checks his laboratory.  He denies presyncope or syncope.  He continues to try to walk daily has not been biking recently.  He continues to be on Vytorin  10/40 hyperlipidemia and lisinopril  30 mg with metoprolol  succinate 50 mg daily for hypertension.  GERD is treated with over-the-counter Prilosec.  I reviewed his lower extremity vascular ultrasound which revealed normal to brachial indices.   He was evaluated by Sharren Decree and apparently was having some issues with blood pressure control.  During her evaluation, she recommended he discontinue metoprolol  and in its place initiate carvedilol  6.25 mg twice a day.  His lisinopril  was increased to 40 mg.  He subsequently kept blood pressure log and with blood pressure elevation amlodipine  5 mg was added to his regimen.  He tells me he subsequently saw Dr. Tisovec and apparently never discontinued all and was taking both metoprolol  in addition to carvedilol .  At that time the metoprolol  was discontinued.  A follow-up echo Doppler study on December 03, 2020 showed normal LV function with EF 60 to 65%.  There was moderate concentric LVH, grade 2 diastolic dysfunction, moderate left atrial dilatation.  He had stable aortic valve replacement with a mean gradient of 10.9 and peak gradient of 20.5 mmHg.  I saw him on February 22, 2021.  He felt well and deniesd chest pain or shortness of breath.  He believes he is starting to slow down.  He used to walk a mile in 12 1/2 minutes and now takes him approximately 15 minutes to walk a mile and he does feel tired.  He denies any chest tightness or dyspnea.  He continues to exercise at least 30 minutes a day.  He recently had laboratory with Dr. Tisovec.  Cholesterol was 141, triglycerides 92, HDL 51, and LDL 72.  I last saw him on October 27, 2021.  At that time he continued to be active and was walking at least 30 minutes a day at a rate of approximately 4 mph, slightly reduced from  his previous 5 mph.  He denies any chest pain or significant change in exercise tolerance.  He recently had an episode of significant constipation which improved with Metamucil.  He also has started to notice a minimal  essential tremor of his right thumb.  He denied chest pain, presyncope or syncope.  He denied palpitations.    He underwent an echo Doppler study on June 27, 2022.  This continued show normal LV function with EF 55 to 60%.  There was grade 2 diastolic dysfunction.  His bioprosthetic aortic valve had normal gradient.  There was no aortic insufficiency.  There was moderate mitral annular calcification and very mild dilation of ascending aorta 37 mm.  I saw him on July 13, 2022 at which time he denied any chest pain or shortness of breath.  However, he has noted some more fatigability.  He is still walking but typically after 20 to 25 minutes of walking he feels like he needs to stop.  Toward the end of his walk he also has difficult time seeming to elevate his legs as much.  He is continues to notice mild tremor of his right hand.  He states his wife believes he is not as quick and his thinking is somewhat slower than previously.  He denies , palpitations, presyncope or syncope.  He has been evaluated by Ervin Heath, PA and last saw him on December 23, 2022.  He had undergone coronary CTA evaluation on November 18, 2022.  Chest CT did not show acute extracardiac findings.  Calcium  score was significantly elevated at 4318 representing 95th percentile.  He had diffuse severe coronary calcifications and there was significant blooming artifact.  Ascending aorta was mildly dilated at 39 mm.  He was status post TAVR.  FFR analysis was essentially negative.    I last saw him on April 11, 2023.  He has been diagnosed with Parkinson's disease in July 2024 and is now followed by Dr. Gracie Lav of neurology and currently is on carbidopa  levodopa  25/100 mg 3 times a day.  He continues to be on amlodipine  5 mg,  carvedilol  6.25 mg twice a day, lisinopril  40 mg daily and aspirin  therapy for hypertension.  He is on Vytorin  10/40.  Laboratory at [facility] by Dr. Kandyce Ort on February 01, 2023 showed total cholesterol 121, triglycerides 70, LDL 60.  Hemoglobin A1c was 5.5.  Chemistry was normal with normal LFTs.  With his significant coronary calcification on CTA I recommended more aggressive lipid management and discontinued Vytorin  and in its place started Zetia  10 mg and rosuvastatin  20 mg.  I also checked LP(a) which was elevated at 217.6.  Since I last saw him, was seen by Marlyse Single, PA-C on August 09, 2023.  He was experiencing some intermittent nonexertional chest pain mostly with lying down and at times the pain was felt in the lower abdomen.  There was some concern that symptoms could be related to acid reflux but he already was taking 2 different PPIs without significant change.  A NT-proBNP was sent which was normal at 458 (normal 0-486 PG/mL).  He underwent a 2D echo Doppler study which was done on Sep 13, 2023 which showed EF 60 to 65% with grade 1 diastolic dysfunction.  There was moderate mitral annular calcification.  There is 25 mm [facility] Ease valve and normal structure and function.  Mean gradient was 7.8.  Mild dilation of ascending aorta was noted at 40 mm.  There was no significant change from prior study.  Presently, he continues to experience some shortness of breath which seems to occur more in the sitting position and at times it is difficult for him to  "catch his breath."  He admits to being  extremely tired.  He does sleep through the night.  He is here with his wife.  He presents for evaluation.   Past Medical History:  Diagnosis Date   Ascending aorta dilation (HCC) 09/14/2023   TTE 09/13/2023: EF 60-65, no RWMA, GR 1 DD, normal RVSF, trivial MR, moderate MAC, AVR with normal structure and function, mild dilation of ascending aorta (40 mm)    Bursitis of elbow 05/20/2013    Essential hypertension 03/12/2013   GERD (gastroesophageal reflux disease)    Hyperlipidemia    Hypertension    Left ventricular diastolic dysfunction, NYHA class 2 03/12/2013   LVH (left ventricular hypertrophy)    with aortic stenosis-bicuspid   PONV (postoperative nausea and vomiting)    as a child   S/P AVR (aortic valve replacement) 05/13/2013   S/P AVR, 05/13/13, 25 mm [facility]-Ease pericardial valve 05/13/2013   Seasonal allergies    Severe aortic stenosis 03/12/2013    Past Surgical History:  Procedure Laterality Date   AORTIC VALVE REPLACEMENT N/A 05/13/2013   Procedure: AORTIC VALVE REPLACEMENT (AVR);  Surgeon: Bartley Lightning, MD;  Location[facility] OR;  Service: Open Heart Surgery;  Laterality: N/A;   APPENDECTOMY  age 78   CARDIAC CATHETERIZATION     COLONOSCOPY     X 2   HIATAL HERNIA REPAIR     INTRAOPERATIVE TRANSESOPHAGEAL ECHOCARDIOGRAM N/A 05/13/2013   Procedure: INTRAOPERATIVE TRANSESOPHAGEAL ECHOCARDIOGRAM;  Surgeon: Bartley Lightning, MD;  Location: [facility] OR;  Service: Open Heart Surgery;  Laterality: N/A;   LEFT HEART CATHETERIZATION WITH CORONARY ANGIOGRAM N/A 03/15/2013   Procedure: LEFT HEART CATHETERIZATION WITH CORONARY ANGIOGRAM;  Surgeon: Millicent Ally, MD;  [facility] C[facility] LAB;  Service: Cardiovascular;  Laterality: N/A;   TONSILLECTOMY     TRANSTHORACIC ECHOCARDIOGRAM  03/04/2013   EF 55-60%, grade 2 diastolic dysfunction, AV with mod calcified leaflets & mild regurg, calcified MV annulus, LA mod dilated, RV mildly dilated    Current Medications: Outpatient Medications Prior to Visit  Medication Sig Dispense Refill   amLODipine  (NORVASC ) 5 MG tablet Take 1 tablet (5 mg total) by mouth daily. 90 tablet 3   aspirin  81 MG tablet Take 81 mg by mouth daily.     carbidopa -levodopa  (SINEMET  IR) 25-100 MG tablet TAKE 1 TABLET BY MOUTH THREE TIMES A DAY 270 tablet 2   carvedilol  (COREG ) 6.25 MG tablet Take 1 tablet (6.25 mg total) by mouth 2 (two) times daily.  180 tablet 3   diphenhydrAMINE (BENADRYL) 25 MG tablet Take 25 mg by mouth every 6 (six) hours as needed.     DULoxetine  (CYMBALTA ) 30 MG capsule Take 30 mg by mouth daily.     esomeprazole (NEXIUM) 20 MG capsule Take 20 mg by mouth daily at 12 noon.     ezetimibe  (ZETIA ) 10 MG tablet Take 1 tablet (10 mg total) by mouth daily. 90 tablet 3   furosemide  (LASIX ) 20 MG tablet Take 1 tablet by mouth on Mondays, Wednesdays and Fridays only 36 tablet 3   gabapentin  (NEURONTIN ) 300 MG capsule Take 600 mg by mouth at bedtime as needed.     lisinopril  (ZESTRIL ) 40 MG tablet Take 1 tablet (40 mg total) by mouth daily. 90 tablet 3   omeprazole (PRILOSEC OTC) 20 MG tablet Take 20 mg by mouth daily.     rosuvastatin  (CRESTOR ) 40 MG tablet Take 1 tablet (40 mg total) by mouth daily. 90 tablet 3   tamsulosin (FLOMAX) 0.4 MG CAPS capsule Take 0.4 mg  by mouth daily.     traZODone  (DESYREL ) 50 MG tablet TAKE 2 TABLETS BY MOUTH AT BEDTIME. 180 tablet 4   DULoxetine  (CYMBALTA ) 30 MG capsule TAKE 1 CAPSULE BY MOUTH EVERY DAY 90 capsule 4   No facility-administered medications prior to visit.     Allergies:   Scopolamine   Social History   Socioeconomic History   Marital status: Married    Spouse name: Not on file   Number of children: 1   Years of education: college   Highest education level: Not on file  Occupational History   Not on file  Tobacco Use   Smoking status: Former    Current packs/day: 0.00    Types: Cigarettes    Quit date: 02/10/1974    Years since quitting: 49.6   Smokeless tobacco: Never  Substance and Sexual Activity   Alcohol  use: Yes    Comment: Drinks 1 bottle of wine daily and 1 beer   Drug use: No   Sexual activity: Not on file  Other Topics Concern   Not on file  Social History Narrative   Not on file   Social Drivers of Health   Financial Resource Strain: Not on file  Food Insecurity: Not on file  Transportation Needs: Not on file  Physical Activity: Not on file   Stress: Not on file  Social Connections: Not on file      Additional social history is notable in that 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 He quit tobacco in 1979. He continues to exercise 7 days per week.  Family History:  The patient's family history includes Acute myelogenous leukemia in his brother; Diabetes in his mother; Heart attack in his father and maternal grandfather; Hyperlipidemia in his mother; Hypertension in his mother; Pneumonia in his maternal grandmother.   ROS General: Negative; No fevers, chills, or night sweats;  HEENT: Negative; No changes in vision or hearing, sinus congestion, difficulty swallowing Pulmonary: Negative; No cough, wheezing, shortness of breath, hemoptysis Cardiovascular: See HPI GI: Negative; No nausea, vomiting, diarrhea, or abdominal pain GU: Negative; No dysuria, hematuria, or difficulty voiding Musculoskeletal: Negative; no myalgias, joint pain, or weakness Hematologic/Oncology: Negative; no easy bruising, bleeding Endocrine: Negative; no heat/cold intolerance; no diabetes Neuro: Left hand tremor, shuffled gait, with recent diagnosis of Parkinson's disease Skin: Negative; No rashes or skin lesions Psychiatric: Negative; No behavioral problems, depression Sleep: Negative; No snoring, daytime sleepiness, hypersomnolence, bruxism, restless legs, hypnogognic hallucinations, no cataplexy Other comprehensive 14 point system review is negative.   PHYSICAL EXAM:   VS:  BP 110/72   Pulse 63   Ht 5' 9.5" (1.765 m)   Wt 172 lb (78 kg)   SpO2 99%   BMI 25.04 kg/m     Repeat blood pressure by me was 112/70  Wt Readings from Last 3 Encounters:  09/14/23 172 lb (78 kg)  08/09/23 169 lb 12.8 oz (77 kg)  04/17/23 170 lb 8 oz (77.3 kg)   General: Alert, oriented, no distress.  Skin: normal turgor, no rashes, warm and dry HEENT: Normocephalic, atraumatic. Pupils equal round and reactive  to light; sclera anicteric; extraocular muscles intact; Nose without nasal septal hypertrophy Mouth/Parynx benign; Mallinpatti scale Neck: No JVD, no carotid bruits; normal carotid upstroke Lungs: clear to ausculatation and percussion; no wheezing or rales Chest wall: without tenderness to palpitation Heart: PMI not displaced, RRR, s1 s2 normal, 1-2/6 systolic murmur, no diastolic murmur, no rubs, gallops, thrills,  or heaves Abdomen: soft, nontender; no hepatosplenomehaly, BS+; abdominal aorta nontender and not dilated by palpation. Back: no CVA tenderness Pulses 2+ Musculoskeletal: full range of motion, normal strength, no joint deformities Extremities: no clubbing cyanosis or edema, Homan's sign negative  Neurologic: No significant cogwheel rigidity grossly nonfocal; Cranial nerves grossly wnl Psychologic: Normal mood and affect   Studies/Labs Reviewed:   EKG Interpretation Date/Time:  Thursday Sep 14 2023 11:27:36 EDT Ventricular Rate:  63 PR Interval:  254 QRS Duration:  100 QT Interval:  396 QTC Calculation: 405 R Axis:   17  Text Interpretation: Sinus rhythm with sinus arrhythmia with 1st degree A-V block Minimal voltage criteria for LVH, may be normal variant ( R in aVL ) Septal infarct , age undetermined When compared with ECG of 09-Aug-2023 14:47, Septal infarct is now Present Confirmed by Drew Andrews ([id number]) on 09/14/2023 12:02:56 PM    April 11, 2023 ECG (independently read by me): Normal sinus rhythm at 60 bpm, first-degree AV block with variable to 56 ms.  July 13, 2022 ECG (independently read by me): Sinus rhythm at 63, 1st degree AV block, PR 270 msec  October 27, 2021 ECG (independently read by me): Sinus rhythm with 1st degree AV block; PR 298 msec  February 22, 2021 ECG (independently read by me): Sinus rhythm at 64, 1 st degree AV block, PR 276 msec  June 08, 2020 ECG (independently read by me): Sinus Bradycardia at 50; 1st degree AV block PR 272 msec; QTc  417 msec  July 2022 ECG (independently read by me): Sinus bradycardia at 54 bpm with first-degree AV block; PR interval 282 ms.  LVH by voltage criteria in aVL.  December 2020 ECG (independently read by me): Sinus bradycardia at 58 bpm.  First-degree AV block with a parable of 256 ms.  LVH by voltage.  QTc interval 402 ms.  December 2019 ECG (independently read by me): Sinus bradycardia 59 bpm with first-degree AV block.  PR interval 278 ms.  LVH by voltage criteria.   March 2019 ECG (independently read by me): Sinus rhythm at 63 bpm.  First-degree AV block.  PAC.  Borderline voltage criteria for LVH.   October 2018 ECG (independently read by me): Sinus bradycardia 58 bpm.  First-degree AV block.  LVH by voltage.   April 2018 ECG (independently read by me): Sinus bradycardia at 58 bpm.  First degree AV block Borderline LVH by voltage criteria.   July 2017 ECG (independently read by me): Sinus bradycardia at 53 bpm.  Mild first-degree AV block with a PR interval at 226 ms.  Borderline LVH by voltage criteria.  Mild T wave abnormality in 3 and aVF.   November 2016 ECG (independently read by me): Normal sinus rhythm at 65 bpm.  First-degree AV block with a PR interval at 244 ms.   October 2015 ECG disease independently read by me): Normal sinus rhythm at 57 beats per minute.  Mild first degree AV block.   Prior 07/08/2013 EKG (independently read by the) normal sinus rhythm. Non-specific ST-T changes inferolaterally.   Prior 03/12/13 ECG: Sinus rhythm at 66 beats per minute. First-degree AV block. LVH by voltage criteria. Mild nondiagnostic T changes.  Recent Labs:    Latest Ref Rng & Units 08/25/2023   12:11 PM 08/09/2023    3:49 PM 11/14/2022   11:22 AM  BMP  Glucose 70 - 99 mg/dL 244  88  97   BUN 8 - 27 mg/dL 25  13  24  Creatinine 0.76 - 1.27 mg/dL 1.61  0.96  0.45   BUN/Creat Ratio 10 - 24 21  12  20    Sodium 134 - 144 mmol/L 139  141  141   Potassium 3.5 - 5.2 mmol/L 4.3  4.3  4.9    Chloride 96 - 106 mmol/L 105  102  104   CO2 20 - 29 mmol/L 17  23  23    Calcium  8.6 - 10.2 mg/dL 8.6  9.0  9.4         Latest Ref Rng & Units 07/08/2013    8:56 AM 05/09/2013    1:13 PM 03/12/2013    9:31 AM  Hepatic Function  Total Protein 6.0 - 8.3 g/dL 6.5  7.4  7.0   Albumin  3.5 - 5.2 g/dL 4.1  4.2  4.6   AST 0 - 37 U/L 16  24  17    ALT 0 - 53 U/L 21  33  18   Alk Phosphatase 39 - 117 U/L 44  43  39   Total Bilirubin 0.2 - 1.2 mg/dL 0.4  0.5  0.7        Latest Ref Rng & Units 11/14/2022   11:22 AM 08/19/2013    9:20 AM 07/08/2013    8:56 AM  CBC  WBC 3.4 - 10.8 x10E3/uL 7.8  6.8  6.8   Hemoglobin 13.0 - 17.7 g/dL 40.9  81.1  91.4   Hematocrit 37.5 - 51.0 % 36.7  39.3  36.7   Platelets 150 - 450 x10E3/uL 151  170  166    Lab Results  Component Value Date   MCV 103 (H) 11/14/2022   MCV 92.0 08/19/2013   MCV 95.1 07/08/2013   Lab Results  Component Value Date   TSH 1.030 01/12/2023   Lab Results  Component Value Date   HGBA1C 5.5 05/09/2013     BNP No results found for: "BNP"  ProBNP    Component Value Date/Time   PROBNP 458 08/25/2023 1211     Lipid Panel     Component Value Date/Time   CHOL 122 07/08/2013 0856   TRIG 89 07/08/2013 0856   HDL 42 07/08/2013 0856   CHOLHDL 2.9 07/08/2013 0856   VLDL 18 07/08/2013 0856   LDLCALC 62 07/08/2013 0856     RADIOLOGY: ECHOCARDIOGRAM COMPLETE Result Date: 09/13/2023    ECHOCARDIOGRAM REPORT   Patient Name:   Drew Andrews Date of Exam: 09/13/2023 Medical Rec #:  [id number]                Height:       68.0 in Accession #:    [id number]               Weight:       169.8 lb Date of Birth:  05-May-1945                 BSA:          1.906 m Patient Age:    78 years                 BP:           120/70 mmHg Patient Gender: M                        HR:           65 bpm. Exam Location:  [address] Procedure: 2D Echo, Cardiac Doppler and Color Doppler (  Both Spectral and Color            Flow Doppler were  utilized during procedure). Indications:    R06.02 Shortness of breath                 R07.9 Chest pain  History:        Patient has prior history of Echocardiogram examinations, most                 recent 06/27/2022. AVR-18mm [employer], CVA.; Risk                 Factors:Hypertension and Dyslipidemia. Parkinson's.                 Aortic Valve: 25 mm [facility] Ease valve is present in the                 aortic position.  Sonographer:    Brigid Canada RDCS Referring Phys: [id number] Awilda Bogus WEAVER IMPRESSIONS  1. Left ventricular ejection fraction, by estimation, is 60 to 65%. The left ventricle has normal function. The left ventricle has no regional wall motion abnormalities. Left ventricular diastolic parameters are consistent with Grade I diastolic dysfunction (impaired relaxation).  2. Right ventricular systolic function is normal. The right ventricular size is mildly enlarged.  3. The mitral valve is degenerative. Trivial mitral valve regurgitation. No evidence of mitral stenosis. Moderate mitral annular calcification.  4. The aortic valve has been repaired/replaced. Aortic valve regurgitation is not visualized. No aortic stenosis is present. There is a 25 mm [employer] valve present in the aortic position. Echo findings are consistent with normal structure and  function of the aortic valve prosthesis. Aortic valve mean gradient measures 7.8 mmHg. Aortic valve Vmax measures 1.88 m/s.  5. Aortic dilatation noted. There is mild dilatation of the ascending aorta, measuring 40 mm.  6. The inferior vena cava is normal in size with greater than 50% respiratory variability, suggesting right atrial pressure of 3 mmHg. Comparison(s): No significant change from prior study. Prior images reviewed side by side. FINDINGS  Left Ventricle: Left ventricular ejection fraction, by estimation, is 60 to 65%. The left ventricle has normal function. The left ventricle has no regional wall motion  abnormalities. The left ventricular internal cavity size was normal in size. There is  no left ventricular hypertrophy. Left ventricular diastolic parameters are consistent with Grade I diastolic dysfunction (impaired relaxation). Right Ventricle: The right ventricular size is mildly enlarged. No increase in right ventricular wall thickness. Right ventricular systolic function is normal. Left Atrium: Left atrial size was normal in size. Right Atrium: Right atrial size was normal in size. Pericardium: There is no evidence of pericardial effusion. Mitral Valve: The mitral valve is degenerative in appearance. There is moderate thickening of the mitral valve leaflet(s). There is moderate calcification of the mitral valve leaflet(s). Moderate mitral annular calcification. Trivial mitral valve regurgitation. No evidence of mitral valve stenosis. Tricuspid Valve: The tricuspid valve is normal in structure. Tricuspid valve regurgitation is not demonstrated. No evidence of tricuspid stenosis. The aortic valve has been repaired/replaced. Aortic valve regurgitation is not visualized. No aortic stenosis is present. There is a 25 mm [employer] valve present in the aortic position. Echo findings are consistent with normal structure and function of the aortic valve prosthesis. Pulmonic Valve: The pulmonic valve was normal in structure. Pulmonic valve regurgitation is mild. No evidence of pulmonic stenosis. Aorta: Aortic dilatation noted. There is mild dilatation  of the ascending aorta, measuring 40 mm. Venous: The inferior vena cava is normal in size with greater than 50% respiratory variability, suggesting right atrial pressure of 3 mmHg. IAS/Shunts: No atrial level shunt detected by color flow Doppler.  LEFT VENTRICLE PLAX 2D LVIDd:         4.00 cm Diastology LVIDs:         2.70 cm LV e' medial:    6.20 cm/s LV PW:         1.00 cm LV E/e' medial:  11.7 LV IVS:        1.00 cm LV e' lateral:   10.48 cm/s                         LV E/e' lateral: 7.0  RIGHT VENTRICLE             IVC RV Basal diam:  4.30 cm     IVC diam: 1.20 cm RV S prime:     13.25 cm/s TAPSE (M-mode): 1.8 cm LEFT ATRIUM             Index        RIGHT ATRIUM           Index LA diam:        5.20 cm 2.73 cm/m   RA Area:     13.90 cm LA Vol (A2C):   46.2 ml 24.23 ml/m  RA Volume:   33.10 ml  17.36 ml/m LA Vol (A4C):   49.5 ml 25.97 ml/m LA Biplane Vol: 49.1 ml 25.76 ml/m  AORTIC VALVE AV Vmax:           188.20 cm/s AV Vmean:          130.200 cm/s AV VTI:            0.358 m AV Peak Grad:      14.2 mmHg AV Mean Grad:      7.8 mmHg LVOT Vmax:         93.00 cm/s LVOT Vmean:        60.800 cm/s LVOT VTI:          0.190 m LVOT/AV VTI ratio: 0.53  AORTA Ao Root diam: 3.40 cm Ao Asc diam:  4.00 cm MITRAL VALVE               TRICUSPID VALVE MV Area (PHT): 2.52 cm    TR Peak grad:   19.9 mmHg MV Decel Time: 301 msec    TR Vmax:        223.00 cm/s MV E velocity: 72.85 cm/s MV A velocity: 96.15 cm/s  SHUNTS MV E/A ratio:  0.76        Systemic VTI: 0.19 m Dorothye Gathers MD Electronically signed by Dorothye Gathers MD Signature Date/Time: 09/13/2023/4:24:56 PM    Final      Additional studies/ records that were reviewed today include:   ECHO SUMMARY:03/14/2019 Left Ventricle: Normal LV size and function with EF 60-65%. There is mild hypertrophy of the basal septum. Diastolic dysfunction is inderterminant. There is increased LV filling pressures. Right Ventricle: Normal RV size and RVF. Right Atrium: Normal size. Left Atrium: Severely dilated. Mitral Valve: Moderate thickening and calcification of the anterior MV leaflet with moderate mitral annular calcification and trivial MR. Aortic Valve: S/P bioprosthetic AVR that appears to be functioning normally. Aortic valve mean gradient measures 10.0 mmHg. Aortic valve peak gradient measures 18.7 mmHg. Aortic valve area, by VTI measures 1.32  cm. There is no perivalvular AI. Pulmonic Valve: There is mild pulmonic  regurgitation. Tricuspid Valve: There is trivial tricuspid regurgitation. Aorta: Mildly dilated ascending aorta at 39mm.   FINDINGS:   Left Ventricle: Left ventricular ejection fraction, by visual estimation, is 60 to 65%. The left ventricle has normal function. There is mildly increased left ventricular hypertrophy of the basal septum. Left ventricular diastolic parameters are  indeterminate. Elevated left ventricular end-diastolic pressure.   Right Ventricle: The right ventricular size is normal. No increase in right ventricular wall thickness. Global RV systolic function is has normal systolic function. The tricuspid regurgitant velocity is 2.07 m/s, and with an assumed right atrial pressure  of 3 mmHg, the estimated right ventricular systolic pressure is normal at 20.2 mmHg.   Left Atrium: Left atrial size was severely dilated.   Right Atrium: Right atrial size was normal in size   Pericardium: There is no evidence of pericardial effusion.   Mitral Valve: The mitral valve is normal in structure. There is moderate thickening of the anterior mitral valve leaflet(s). There is moderate calcification of the anterior mitral valve leaflet(s). Moderate mitral annular calcification. No evidence of  mitral valve stenosis by observation. MV peak gradient, 6.7 mmHg. Trace mitral valve regurgitation.   Tricuspid Valve: The tricuspid valve is normal in structure. Tricuspid valve regurgitation is mild.   Aortic Valve: The aortic valve is normal in structure. Aortic valve regurgitation is not visualized. The aortic valve is structurally normal, with no evidence of sclerosis or stenosis. Aortic valve mean gradient measures 10.0 mmHg. Aortic valve peak  gradient measures 18.7 mmHg. Aortic valve area, by VTI measures 1.32 cm.   Pulmonic Valve: The pulmonic valve was normal in structure. Pulmonic valve regurgitation is mild.   Aorta: Aortic dilatation noted. There is mild dilatation of the ascending  aorta measuring 39 mm.   Venous: The inferior vena cava is normal in size with greater than 50% respiratory variability, suggesting right atrial pressure of 3 mmHg.   IAS/Shunts: No atrial level shunt detected by color flow Doppler. No ventricular septal defect is seen or detected. There is no evidence of an atrial septal defect.       LEFT VENTRICLE PLAX 2D LVIDd:         4.12 cm  Diastology LVIDs:         3.08 cm  LV e' lateral:   11.20 cm/s LV PW:         0.97 cm  LV E/e' lateral: 9.1 LV IVS:        1.25 cm  LV e' medial:    5.98 cm/s LVOT diam:     2.00 cm  LV E/e' medial:  17.1 LV SV:         38 ml LV SV Index:   18.76 LVOT Area:     3.14 cm     RIGHT VENTRICLE RV Basal diam:  4.88 cm RV S prime:     12.50 cm/s TAPSE (M-mode): 2.5 cm RVSP:           20.2 mmHg   LEFT ATRIUM              Index       RIGHT ATRIUM           Index LA diam:        5.00 cm  2.52 cm/m  RA Pressure: 3.00 mmHg LA Vol (A2C):   95.6 ml  48.15 ml/m RA Area:     19.05  cm LA Vol (A4C):   101.0 ml 50.87 ml/m RA Volume:   50.85 ml  25.61 ml/m LA Biplane Vol: 101.0 ml 50.87 ml/m  AORTIC VALVE AV Area (Vmax):    1.22 cm AV Area (Vmean):   1.30 cm AV Area (VTI):     1.32 cm AV Vmax:           216.00 cm/s AV Vmean:          143.000 cm/s AV VTI:            0.516 m AV Peak Grad:      18.7 mmHg AV Mean Grad:      10.0 mmHg LVOT Vmax:         84.20 cm/s LVOT Vmean:        59.000 cm/s LVOT VTI:          0.217 m LVOT/AV VTI ratio: 0.42   AORTA Ao Root diam: 3.50 cm   MITRAL VALVE                         TRICUSPID VALVE MV Area (PHT): 2.80 cm              TR Peak grad:   17.2 mmHg MV Peak grad:  6.7 mmHg              TR Vmax:        263.00 cm/s MV Mean grad:  2.0 mmHg              Estimated RAP:  3.00 mmHg MV Vmax:       1.29 m/s              RVSP:           20.2 mmHg MV Vmean:      67.4 cm/s MV VTI:        0.42 m                SHUNTS MV PHT:        78.59 msec            Systemic VTI:   0.22 m MV Decel Time: 271 msec              Systemic Diam: 2.00 cm MV E velocity: 102.00 cm/s 103 cm/s MV A velocity: 112.00 cm/s 70.3 cm/s MV E/A ratio:  0.91        1.5    LE Vascular US : 01/13/2020  Summary:  Right: Resting right ankle-brachial index indicates noncompressible right  lower extremity arteries. The right toe-brachial index is normal.   Left: Resting left ankle-brachial index indicates noncompressible left  lower extremity arteries. The left toe-brachial index is normal.    ECHO: 12/03/2020 IMPRESSIONS   1. Left ventricular ejection fraction, by estimation, is 60 to 65%. The  left ventricle has normal function. The left ventricle has no regional  wall motion abnormalities. There is moderate concentric left ventricular  hypertrophy. Left ventricular  diastolic parameters are consistent with Grade II diastolic dysfunction  (pseudonormalization). Elevated left atrial pressure.   2. Right ventricular systolic function is normal. The right ventricular  size is normal.   3. Left atrial size was moderately dilated.   4. The mitral valve is normal in structure. Trivial mitral valve  regurgitation. No evidence of mitral stenosis.   5. The aortic valve has been repaired/replaced. Aortic valve  regurgitation is not visualized. No aortic stenosis is present. There is a  bioprosthetic valve present in the aortic position. Echo findings are  consistent with normal structure and function  of the aortic valve prosthesis. Aortic valve mean gradient measures 10.9  mmHg. Aortic valve Vmax measures 2.27 m/s. Aortic valve acceleration time  measures 92 msec.   6. There is borderline dilatation of the ascending aorta, measuring 37  mm.   7. The inferior vena cava is normal in size with greater than 50%  respiratory variability, suggesting right atrial pressure of 3 mmHg.   Comparison(s): No significant change from prior study.   ECHO: 06/27/2022 1. Left ventricular ejection  fraction, by estimation, is 55 to 60%. The  left ventricle has normal function. The left ventricle has no regional  wall motion abnormalities. There is mild concentric left ventricular  hypertrophy. Left ventricular diastolic  parameters are consistent with Grade II diastolic dysfunction  (pseudonormalization).   2. Right ventricular systolic function is normal. The right ventricular  size is mildly enlarged. There is normal pulmonary artery systolic  pressure. The estimated right ventricular systolic pressure is 20.6 mmHg.   3. Left atrial size was moderately dilated.   4. The mitral valve is degenerative. Trivial mitral valve regurgitation.  No evidence of mitral stenosis. Moderate mitral annular calcification.   5. Bioprosthetic aortic valve replacement. Mean gradient 7 mmHg, no  significant stenosis. No significant peri-valvular leakage noted.   6. Aortic dilatation noted. There is mild dilatation of the ascending  aorta, measuring 37 mm.   7. The inferior vena cava is normal in size with greater than 50%  respiratory variability, suggesting right atrial pressure of 3 mmHg.    CORONARY CTA: 11/18/2022 FINDINGS: A 100 kV prospective scan was triggered in the descending thoracic aorta at 111 HU's. Axial non-contrast 3 mm slices were carried out through the heart. The data set was analyzed on a dedicated work station and scored using the Agatson method. Gantry rotation speed was 250 msecs and collimation was .6 mm. No beta blockade and 0.8 mg of sl NTG was given. The 3D data set was reconstructed in 5% intervals of the 35-75% of the R-R cycle. Phases were analyzed on a dedicated work station using MPR, MIP and VRT modes. The patient received 100 cc of contrast.   Coronary Arteries:  Normal coronary origin.  Left dominance.   RCA is a small nondominant artery. Calcified plaque in proximal RCA causes 25-49% stenosis. Calcified plaque in acute marginal branch of RCA causes 50-69%  stenosis.   Left main is a large artery that gives rise to LAD and LCX arteries. Calcified plaque in left main causes 0-24% stenosis.   LAD is a large vessel that has no plaque. Calcified plaque in proximal LAD causes 25-49% stenosis. Calcified plaque in mid LAD causes 50-69% stenosis. Calcified plaque in distal LAD causes 50-69% stenosis   LCX is a dominant artery. Calcified plaque in proximal LCX causes 24-49% stenosis. Calcified plaque in mid LCX causes 50-69% stenosis. Calcified plaque in distal LCX causes 24-49% stenosis. Calcified plaque in OM2 causes 25-49% stenosis   Other findings:   Left Ventricle: Normal size   Left Atrium: Normal size   Pulmonary Veins: Normal configuration   Right Ventricle: Moderate enlargement   Right Atrium: Mild enlargement   Cardiac valves: S/p bioprosthetic AVR   Thoracic aorta: Dilated ascending aorta measuring 39mm   Pulmonary Arteries: Normal size   Systemic Veins: Normal drainage   Pericardium: Normal thickness   IMPRESSION: 1. Coronary calcium  score of 4318. This was 95th  percentile for age and sex matched control.   2. Normal coronary origin with left dominance.   3. Diffuse severe coronary calcifications, which makes it difficult to determine severity of coronary stenosis due to blooming artifact. Appears to be regions of moderate stenosis, will send study for CTFFR   4. Dilated ascending aorta measuring 39mm   5. S/p bioprosthetic AVR   CAD-RADS 3. Moderate stenosis. Consider symptom-guided anti-ischemic pharmacotherapy as well as risk factor modification per guideline directed care. Additional analysis with CT FFR will be submitted.   Electronically Signed: By: Carson Clara M.D. On: 11/18/2022 19:23  FFR:  FINDINGS: FFRct analysis was performed on the original cardiac CT angiogram dataset. Diagrammatic representation of the FFRct analysis is provided in a separate PDF document in PACS. This dictation  was created using the PDF document and an interactive 3D model of the results. 3D model is not available in the EMR/PACS. Normal FFR range is >0.80.   1. Left Main: No significant stenosis   2. LAD: CTFFR 0.92 across lesion in proximal to mid LAD, suggesting lesion is not functionally significant. CTFFR gradually tapers down the vessel, dropping to 0.79 across lesion in distal LAD, with change in FFR across lesion not suggestive of functional significance   3. LCX: CTFFR 0.95 across lesion in mid LCX, suggesting lesion is not functionally significant. CTFFR 0.94 across lesion in OM2, suggesting no functional significance   4. RCA: CTFFR 0.95 across lesion in proximal RCA, suggesting lesion is not functionally significant. CTFFR 0.83 across lesion in AM branch, suggesting no functional significance   IMPRESSION: 1.  CTFFR suggests nonobstructive CAD   ECHO: 09/13/2023  1. Left ventricular ejection fraction, by estimation, is 60 to 65%. The  left ventricle has normal function. The left ventricle has no regional  wall motion abnormalities. Left ventricular diastolic parameters are  consistent with Grade I diastolic  dysfunction (impaired relaxation).   2. Right ventricular systolic function is normal. The right ventricular  size is mildly enlarged.   3. The mitral valve is degenerative. Trivial mitral valve regurgitation.  No evidence of mitral stenosis. Moderate mitral annular calcification.   4. The aortic valve has been repaired/replaced. Aortic valve  regurgitation is not visualized. No aortic stenosis is present. There is a  25 mm [employer] valve present in the aortic position. Echo  findings are consistent with normal structure and   function of the aortic valve prosthesis. Aortic valve mean gradient  measures 7.8 mmHg. Aortic valve Vmax measures 1.88 m/s.   5. Aortic dilatation noted. There is mild dilatation of the ascending  aorta, measuring 40 mm.   6. The  inferior vena cava is normal in size with greater than 50%  respiratory variability, suggesting right atrial pressure of 3 mmHg.   Comparison(s): No significant change from prior study. Prior images  reviewed side by side.   ASSESSMENT:    1. Coronary artery disease involving native coronary artery of native heart without angina pectoris   2. Severe aortic stenosis   3. S/P AVR (aortic valve replacement)   4. Essential hypertension   5. SOB (shortness of breath)   6. Elevated Lp(a)   7. Hyperlipidemia with target low density lipoprotein (LDL) cholesterol less than 50 mg/dL   8. GERD without esophagitis   9. Parkinson's disease without dyskinesia or fluctuating manifestations (HCC)     PLAN:  Drew Andrews is a 78 year-old gentleman who developed severe aortic stenosis and underwent successful AVR with a  25 mm [facility] Ease bovine pericardial tissue valve on May 13, 2013.  He has a history of hypertension and hyperlipidemia.  On his echo Doppler study of March 14, 2019 his bioprosthetic valve remained stable with normal gradients and without evidence for perivalvular leak.  EF was 60 to 65% with mild left ventricular hypertrophy of the basal septum.  He had moderate mitral annular calcification with trivial MR, mild PR and his ascending aorta measured 39 mm.  A follow-up echo on December 03, 2020 remained stable and showed normal LV function with EF 60 to 65%.  There was moderate concentric LVH with grade 2 diastolic dysfunction and moderate left atrial dilatation.  His aortic valve gradients were stable with a mean gradient of 10.9 and peak gradient of 20.5.  His most recent echo from June 27, 2022 showed EF 55 to 60% with grade 2 diastolic dysfunction.  His mean bioprosthetic aortic gradient was 7 mm.  There is no perivalvular leak.  There was mild dilation of ascending aorta at 37 mm.  At my evaluation in March 2024, his posture was highly suggestive of Parkinson's disease with  left hand tremor and without definitive cogwheel rigidity.  He has subsequently been diagnosed with Parkinson's disease who is now followed by Dr. Gracie Lav of neurology and is on carbidopa /levodopa  25/100 mg 3 times a day.  He admits to more fatigability.  Coronary CTA which showed markedly elevated calcium  score at 4318 with multivessel coronary calcification with FFR analysis suggesting nonobstructive disease.  At my office visit in December 2020 for a suggested more aggressive lipid management, discontinued Vytorin  and started him on rosuvastatin  20 mg with Zetia  10 mg.  I also check LP(a) which subsequently came back significantly elevated at 217.  Recently, he has noticed shortness of breath predominantly when he is sitting upright in a chair.  He also admits to nonexertional chest sensation which occurs when laying back with feet up.  I reviewed his most recent echo Doppler study which continues to show normal LV function with a EF 60 to 65%.  There was grade 1 diastolic dysfunction.  He had moderate mitral annular calcification without mitral stenosis and his [employer] Ease aortic valve was stable.  He takes Nexium and omeprazole for acid reflux.  I am recommending continued aggressive lipid management with target LDL less than 50.  He tells me Dr. Tisovec will be rechecking laboratory.  If he is not at goal further titration to rosuvastatin  40 mg with Zetia  is recommended and possible initiation of PCSK9 inhibition.  I am scheduling him for NM PET cardiac perfusion stress test particularly with his significant coronary calcification and symptomatology.  He may require GI evaluation in light of his GERD symptomatology.  He is aware of my upcoming retirement.  I will transition him to the care of Dr. Alberta Hug with plan follow-up in 3 months or sooner as needed.   Medication Adjustments/Labs and Tests Ordered: Current medicines are reviewed at length with the patient today.  Concerns regarding medicines are  outlined above.  Medication changes, Labs and Tests ordered today are listed in the Patient Instructions below. Patient Instructions  Medication Instructions:  No changes *If you need a refill on your cardiac medications before your next appointment, please call your pharmacy*  Lab Work: No labs  Testing/Procedures:    Please report to Radiology at the [facility] Main Entrance 30 minutes early for your test.  [address] [place], [place] [address]  How to Prepare for Your Cardiac PET/CT Stress Test:  Nothing to eat or drink, except water, 3 hours prior to arrival time.  NO caffeine/decaffeinated products, or chocolate 12 hours prior to arrival. (Please note decaffeinated beverages (teas/coffees) still contain caffeine).  If you have caffeine within 12 hours prior, the test will need to be rescheduled.  Medication instructions: Do not take erectile dysfunction medications for 72 hours prior to test (sildenafil, tadalafil) Do not take nitrates (isosorbide mononitrate, Ranexa) the day before or day of test Do not take tamsulosin the day before or morning of test  Diabetic Preparation: If able to eat breakfast prior to 3 hour fasting, you may take all medications, including your insulin . Do not worry if you miss your breakfast dose of insulin  - start at your next meal. If you do not eat prior to 3 hour fast-Hold all diabetes (oral and insulin ) medications. Patients who wear a continuous glucose monitor MUST remove the device prior to scanning.  You may take your remaining medications with water.  NO perfume, cologne or lotion on chest or abdomen area.  Total time is 1 to 2 hours; you may want to bring reading material for the waiting time.  IF YOU THINK YOU MAY BE PREGNANT, OR ARE NURSING PLEASE INFORM THE TECHNOLOGIST.  In preparation for your appointment, medication and supplies will be purchased.  Appointment availability is limited, so if you need to cancel or  reschedule, please call the Radiology Department Scheduler at [phone] 24 hours in advance to avoid a cancellation fee of $100.00  What to Expect When you Arrive:  Once you arrive and check in for your appointment, you will be taken to a preparation room within the Radiology Department.  A technologist or Nurse will obtain your medical history, verify that you are correctly prepped for the exam, and explain the procedure.  Afterwards, an IV will be started in your arm and electrodes will be placed on your skin for EKG monitoring during the stress portion of the exam. Then you will be escorted to the PET/CT scanner.  There, staff will get you positioned on the scanner and obtain a blood pressure and EKG.  During the exam, you will continue to be connected to the EKG and blood pressure machines.  A small, safe amount of a radioactive tracer will be injected in your IV to obtain a series of pictures of your heart along with an injection of a stress agent.    After your Exam:  It is recommended that you eat a meal and drink a caffeinated beverage to counter act any effects of the stress agent.  Drink plenty of fluids for the remainder of the day and urinate frequently for the first couple of hours after the exam.  Your doctor will inform you of your test results within 7-10 business days.  For more information and frequently asked questions, please visit our website: [email or web address]  For questions about your test or how to prepare for your test, please call: Cardiac Imaging Nurse Navigators Office: [phone]  Follow-Up: At [facility], you and your health needs are our priority.  As part of our continuing mission to provide you with exceptional heart care, our providers are all part of one team.  This team includes your primary Cardiologist (physician) and Advanced Practice Providers or APPs (Physician Assistants and Nurse Practitioners) who all work together to  provide you with the care you need, when you need it.  Your next appointment:  3 month(s)  Provider:   Jackquelyn Mass   We recommend signing up for the patient portal called "MyChart".  Sign up information is provided on this After Visit Summary.  MyChart is used to connect with patients for Virtual Visits (Telemedicine).  Patients are able to view lab/test results, encounter notes, upcoming appointments, etc.  Non-urgent messages can be sent to your provider as well.   To learn more about what you can do with MyChart, go to [email or web address].    Signed, Drew Schuller, MD  09/16/2023 9:47 AM    [facility] [address], [address], [place], [place]  [address] Phone: [phone]

## 2023-09-16 ENCOUNTER — Encounter: Payer: Self-pay | Admitting: Cardiovascular Disease

## 2023-10-20 DIAGNOSIS — M5459 Other low back pain: Secondary | ICD-10-CM | POA: Diagnosis not present

## 2023-10-20 DIAGNOSIS — M5416 Radiculopathy, lumbar region: Secondary | ICD-10-CM | POA: Diagnosis not present

## 2023-10-23 ENCOUNTER — Ambulatory Visit: Payer: Medicare HMO | Admitting: Family Medicine

## 2023-10-23 ENCOUNTER — Encounter: Payer: Self-pay | Admitting: Family Medicine

## 2023-10-23 VITALS — BP 115/70 | HR 68 | Ht 69.0 in | Wt 176.0 lb

## 2023-10-23 DIAGNOSIS — R4189 Other symptoms and signs involving cognitive functions and awareness: Secondary | ICD-10-CM | POA: Diagnosis not present

## 2023-10-23 DIAGNOSIS — M5440 Lumbago with sciatica, unspecified side: Secondary | ICD-10-CM

## 2023-10-23 DIAGNOSIS — F419 Anxiety disorder, unspecified: Secondary | ICD-10-CM

## 2023-10-23 DIAGNOSIS — G20A1 Parkinson's disease without dyskinesia, without mention of fluctuations: Secondary | ICD-10-CM | POA: Diagnosis not present

## 2023-10-23 MED ORDER — CARBIDOPA-LEVODOPA 25-100 MG PO TABS: 1.0000 | ORAL_TABLET | Freq: Four times a day (QID) | ORAL | 3 refills | Status: AC

## 2023-10-23 NOTE — Patient Instructions (Signed)
 Below is our plan:  We will increase carbidopa -levodopa  25-100mg  to 1 tablet four times daily. I recommend taking dose around 7a, 10a, 1p, and 5p. Continue gabapentin  600mg  at bedtime and duloxetine  30mg  daily for now. We may wean these medications once MRI has been completed. Do not abruptly stop anything. Call me if you decide to wean these medicaitons and I will help you. Continue trazodone  50-100mg  daily at bedtime. Try to avoid Benadryl.   Please make sure you are staying well hydrated. I recommend 50-60 ounces daily. Well balanced diet and regular exercise encouraged. Consistent sleep schedule with 6-8 hours recommended.   Please continue follow up with care team as directed.   Follow up with me in 6 months   You may receive a survey regarding today's visit. I encourage you to leave honest feed back as I do use this information to improve patient care. Thank you for seeing me today!   Management of Memory Problems   There are some general things you can do to help manage your memory problems.  Your memory may not in fact recover, but by using techniques and strategies you will be able to manage your memory difficulties better.   1)  Establish a routine. Try to establish and then stick to a regular routine.  By doing this, you will get used to what to expect and you will reduce the need to rely on your memory.  Also, try to do things at the same time of day, such as taking your medication or checking your calendar first thing in the morning. Think about think that you can do as a part of a regular routine and make a list.  Then enter them into a daily planner to remind you.  This will help you establish a routine.   2)  Organize your environment. Organize your environment so that it is uncluttered.  Decrease visual stimulation.  Place everyday items such as keys or cell phone in the same place every day (ie.  Basket next to front door) Use post it notes with a brief message to yourself  (ie. Turn off light, lock the door) Use labels to indicate where things go (ie. Which cupboards are for food, dishes, etc.) Keep a notepad and pen by the telephone to take messages   3)  Memory Aids A diary or journal/notebook/daily planner Making a list (shopping list, chore list, to do list that needs to be done) Using an alarm as a reminder (kitchen timer or cell phone alarm) Using cell phone to store information (Notes, Calendar, Reminders) Calendar/White board placed in a prominent position Post-it notes   In order for memory aids to be useful, you need to have good habits.  It's no good remembering to make a note in your journal if you don't remember to look in it.  Try setting aside a certain time of day to look in journal.   4)  Improving mood and managing fatigue. There may be other factors that contribute to memory difficulties.  Factors, such as anxiety, depression and tiredness can affect memory. Regular gentle exercise can help improve your mood and give you more energy. Exercise: there are short videos created by the [employer] on Health specially for older adults: https://bit.ly/2I30q97.  Mediterranean diet: which emphasizes fruits, vegetables, whole grains, legumes, fish, and other seafood; unsaturated fats such as olive oils; and low amounts of red meat, eggs, and sweets. A variation of this, called MIND [facility] Intervention for Neurodegenerative Delay) incorporates  the DASH (Dietary Approaches to Stop Hypertension) diet, which has been shown to lower high blood pressure, a risk factor for Alzheimer's disease. More information at: ExitMarketing.de.  Aerobic exercise that improve heart health is also good for the mind.  [employer] on Aging have short videos for exercises that you can do at home: BlindWorkshop.com.pt Simple relaxation techniques may help relieve symptoms of  anxiety Try to get back to completing activities or hobbies you enjoyed doing in the past. Learn to pace yourself through activities to decrease fatigue. Find out about some local support groups where you can share experiences with others. Try and achieve 7-8 hours of sleep at night.   Tasks to improve attention/working memory 1. Good sleep hygiene (7-8 hrs of sleep) 2. Learning a new skill (Painting, Carpentry, Pottery, new language, Knitting). 3.Cognitive exercises (keep a daily journal, Puzzles) 4. Physical exercise and training  (30 min/day X 4 days week) 5. Being on Antidepressant if needed 6.Yoga, Meditation, Tai Chi 7. Decrease alcohol  intake 8.Have a clear schedule and structure in daily routine   MIND Diet: The Mediterranean-DASH Diet Intervention for Neurodegenerative Delay, or MIND diet, targets the health of the aging brain. Research participants with the highest MIND diet scores had a significantly slower rate of cognitive decline compared with those with the lowest scores. The effects of the MIND diet on cognition showed greater effects than either the Mediterranean or the DASH diet alone.   The healthy items the MIND diet guidelines suggest include:   3+ servings a day of whole grains 1+ servings a day of vegetables (other than green leafy) 6+ servings a week of green leafy vegetables 5+ servings a week of nuts 4+ meals a week of beans 2+ servings a week of berries 2+ meals a week of poultry 1+ meals a week of fish Mainly olive oil if added fat is used   The unhealthy items, which are higher in saturated and trans fat, include: Less than 5 servings a week of pastries and sweets Less than 4 servings a week of red meat (including beef, pork, lamb, and products made from these meats) Less than one serving a week of cheese and fried foods Less than 1 tablespoon a day of butter/stick margarine

## 2023-10-23 NOTE — Progress Notes (Signed)
 Chief Complaint  Patient presents with   Follow-up    Pt in room 1. Wife Reena in room. Here for Parkinson/ memory follow up. No recent falls, pt is having lots of pain in legs, had x-ray, seeing Orthopedic scheduled an MRI in July. Taking exercise class once per week. Pt said tremors are slightly worse. Wife said short term comes and goes worse in mornings. MOCA:25/30    HISTORY OF PRESENT ILLNESS:  10/23/23 ALL:  Plumer Mittelstaedt is a 78 y.o. male here today for follow up for PD. He was last seen by Dr Onita 04/2023 and reported improvement in movements on carb/levo 1 tablet TID but worsening constipation, fatigue, knee pain, nose bleeds, stomach aches and insomnia. He was encouraged to avoid caffeine and Benadryl. Started on trazodone  and carb/levo continued. Gabapentin  started 06/2023 due to bilateral leg/knee pain. He was switched to duloxetine  07/2023 as gabapentin  was not effective. Lumbar xray showed multilevel degenerative disc and facet disease. MOCA was 24/30.   Since, he reports doing fair. He continues carb/levo 1 tablet around 7a-8a, 12p, 5p. He misses a dose about once a week. Tremor may be slightly worse. Gait is stable. No falls. He continues to have pain in posterior thighs. R>L. Sharp shoot pain when he walks. Now seeing orthopedics. MRI lumbar spine scheduled 10/2023. Advil and steroids helped. Now on prednisone 10mg  daily. Gabapentin  and duloxetine  have not seemed to help much at all. Sleep is somewhat better. He feels trazodone  has helped. Usually only takes 1 tablet. Still taking Benadryl. Constipation has improved with Miralax. He has a decreased appetite. He likes to eat ice cream. Mood is stable. Memory seems to wax and wane. He seems a little more confused in the mornings.    HISTORY (copied from Dr Georgianne previous note)  Yogi Arther is a 78 year old male, accompanied his wife, seen in request by cardiologist PA Meng, Hao, for evaluation of  Parkinson's, primary care physician is Dr. Tisovec, Charlie [facility]N, initial evaluation January 12, 2023   I reviewed and summarized the referring note.PMHX S/p Aortic valve replacement in 2015. HTN HLD  Alcohol    He used to be active, over the past couple years, noticed gradually decline, loss sense of smell, wife also reported he talking out of dream, he is a retired Art gallery manager, noticed mild memory loss,   Was noted to have some Parkinson features by primary care physician Dr.Tisovec in 2023, he wants to put it off, worsening symptoms since 2024, could no longer finish his daily walking, loss of appetite with weight loss over past few months, worsening right hand tremor, gait abnormality, he used to be easygoing, now very anxious about his slow decline   UPDATE Dec 16th 2024: He is accompanied by wife at today's clinical visit, tolerating Sinemet  25/100 mg 3 times daily at 7 AM 12 and 5 PM, wife reported better movement the last hand shaking,   However since starting the medicine he noticed multitude of other symptoms such as worsening constipation, feeling cold, fatigue, bilateral behind the knee achy pain, occasionally nosebleeding, worsening stomach discomfort, he suspicious those are due to side effect of the Sinemet    He is drink 6 to 7 cups of coffee daily,, as needed to help his abdominal complaints,   He also complains of difficulty sleeping, often wake up at 2 AM, take extra dose of Benadryl   Personally reviewed MRI of the brain without contrast February 16, 2023, mild to moderate generalized atrophy, few scattered  small vessel disease   Lab in 2024 normal B12 TSH negative RPR   REVIEW OF SYSTEMS: Out of a complete 14 system review of symptoms, the patient complains only of the following symptoms, tremor, gait instability, anxiety, raspy voice, and all other reviewed systems are negative.   ALLERGIES: Allergies  Allergen Reactions   Scopolamine     seizure     HOME  MEDICATIONS: Outpatient Medications Prior to Visit  Medication Sig Dispense Refill   amLODipine  (NORVASC ) 5 MG tablet Take 1 tablet (5 mg total) by mouth daily. 90 tablet 3   aspirin  81 MG tablet Take 81 mg by mouth daily.     carvedilol  (COREG ) 6.25 MG tablet Take 1 tablet (6.25 mg total) by mouth 2 (two) times daily. 180 tablet 3   diphenhydrAMINE (BENADRYL) 25 MG tablet Take 25 mg by mouth every 6 (six) hours as needed. (Patient taking differently: Take 25 mg by mouth at bedtime.)     DULoxetine  (CYMBALTA ) 30 MG capsule Take 30 mg by mouth daily.     esomeprazole (NEXIUM) 20 MG capsule Take 20 mg by mouth daily at 12 noon.     ezetimibe  (ZETIA ) 10 MG tablet Take 1 tablet (10 mg total) by mouth daily. 90 tablet 3   gabapentin  (NEURONTIN ) 300 MG capsule Take 600 mg by mouth at bedtime as needed.     lisinopril  (ZESTRIL ) 40 MG tablet Take 1 tablet (40 mg total) by mouth daily. 90 tablet 3   omeprazole (PRILOSEC OTC) 20 MG tablet Take 20 mg by mouth daily.     predniSONE (DELTASONE) 10 MG tablet Take 10 mg by mouth daily with breakfast.     rosuvastatin  (CRESTOR ) 40 MG tablet Take 1 tablet (40 mg total) by mouth daily. 90 tablet 3   tamsulosin (FLOMAX) 0.4 MG CAPS capsule Take 0.4 mg by mouth daily.     traZODone  (DESYREL ) 50 MG tablet TAKE 2 TABLETS BY MOUTH AT BEDTIME. 180 tablet 4   carbidopa -levodopa  (SINEMET  IR) 25-100 MG tablet TAKE 1 TABLET BY MOUTH THREE TIMES A DAY 270 tablet 2   furosemide  (LASIX ) 20 MG tablet Take 1 tablet by mouth on Mondays, Wednesdays and Fridays only (Patient not taking: Reported on 10/23/2023) 36 tablet 3   No facility-administered medications prior to visit.     PAST MEDICAL HISTORY: Past Medical History:  Diagnosis Date   Ascending aorta dilation (HCC) 09/14/2023   TTE 09/13/2023: EF 60-65, no RWMA, GR 1 DD, normal RVSF, trivial MR, moderate MAC, AVR with normal structure and function, mild dilation of ascending aorta (40 mm)    Bursitis of elbow  05/20/2013   Essential hypertension 03/12/2013   GERD (gastroesophageal reflux disease)    Hyperlipidemia    Hypertension    Left ventricular diastolic dysfunction, NYHA class 2 03/12/2013   LVH (left ventricular hypertrophy)    with aortic stenosis-bicuspid   PONV (postoperative nausea and vomiting)    as a child   S/P AVR (aortic valve replacement) 05/13/2013   S/P AVR, 05/13/13, 25 mm Edwards Magna-Ease pericardial valve 05/13/2013   Seasonal allergies    Severe aortic stenosis 03/12/2013     PAST SURGICAL HISTORY: Past Surgical History:  Procedure Laterality Date   AORTIC VALVE REPLACEMENT N/A 05/13/2013   Procedure: AORTIC VALVE REPLACEMENT (AVR);  Surgeon: Dorise MARLA Fellers, MD;  Location[facility] OR;  Service: Open Heart Surgery;  Laterality: N/A;   APPENDECTOMY  age 41   CARDIAC CATHETERIZATION     COLONOSCOPY  X 2   HIATAL HERNIA REPAIR     INTRAOPERATIVE TRANSESOPHAGEAL ECHOCARDIOGRAM N/A 05/13/2013   Procedure: INTRAOPERATIVE TRANSESOPHAGEAL ECHOCARDIOGRAM;  Surgeon: Dorise MARLA Fellers, MD;  Location[facility] OR;  Service: Open Heart Surgery;  Laterality: N/A;   LEFT HEART CATHETERIZATION WITH CORONARY ANGIOGRAM N/A 03/15/2013   Procedure: LEFT HEART CATHETERIZATION WITH CORONARY ANGIOGRAM;  Surgeon: Debby DELENA Sor, MD;  Location: [facility] CATH LAB;  Service: Cardiovascular;  Laterality: N/A;   TONSILLECTOMY     TRANSTHORACIC ECHOCARDIOGRAM  03/04/2013   EF 55-60%, grade 2 diastolic dysfunction, AV with mod calcified leaflets & mild regurg, calcified MV annulus, LA mod dilated, RV mildly dilated     FAMILY HISTORY: Family History  Problem Relation Age of Onset   Hypertension Mother    Hyperlipidemia Mother    Diabetes Mother    Heart attack Father    Acute myelogenous leukemia Brother    Pneumonia Maternal Grandmother    Heart attack Maternal Grandfather    Colon cancer Neg Hx    Stomach cancer Neg Hx      SOCIAL HISTORY: Social History   Socioeconomic History   Marital  status: Married    Spouse name: Not on file   Number of children: 1   Years of education: college   Highest education level: Not on file  Occupational History   Not on file  Tobacco Use   Smoking status: Former    Current packs/day: 0.00    Types: Cigarettes    Quit date: 02/10/1974    Years since quitting: 49.7   Smokeless tobacco: Never  Vaping Use   Vaping status: Never Used  Substance and Sexual Activity   Alcohol  use: Yes    Comment: Drinks 1 bottle of wine daily and 1 beer   Drug use: No   Sexual activity: Not on file  Other Topics Concern   Not on file  Social History Narrative   Not on file   Social Drivers of Health   Financial Resource Strain: Not on file  Food Insecurity: Not on file  Transportation Needs: Not on file  Physical Activity: Not on file  Stress: Not on file  Social Connections: Not on file  Intimate Partner Violence: Not on file     PHYSICAL EXAM  Vitals:   10/23/23 1345  BP: 115/70  Pulse: 68  SpO2: 97%  Weight: 176 lb (79.8 kg)  Height: 5' 9 (1.753 m)   Body mass index is 25.99 kg/m.  Generalized: Well developed, in no acute distress, mildly masked face.  Cardiology: normal rate and rhythm, no murmur auscultated  Respiratory: clear to auscultation bilaterally    Neurological examination  Mentation: Alert oriented to time, place, history taking. Follows all commands speech and language fluent Cranial nerve II-XII: Pupils were equal round reactive to light. Extraocular movements were full, visual field were full on confrontational test. Facial sensation and strength were normal. Uvula tongue midline. Head turning and shoulder shrug  were normal and symmetric. Motor: The motor testing reveals 5 over 5 strength of all 4 extremities. Good symmetric motor tone is noted throughout. Mild resting tremor of right hand noted during visit Sensory: Sensory testing is intact to soft touch on all 4 extremities. No evidence of extinction is  noted.  Coordination: Cerebellar testing reveals good finger-nose-finger and heel-to-shin bilaterally.  Gait and station: Stooped posture, Gait is short, stable without assistive device   DIAGNOSTIC DATA (LABS, IMAGING, TESTING) - I reviewed patient records, labs, notes, testing and imaging  myself where available.  Lab Results  Component Value Date   WBC 7.8 11/14/2022   HGB 12.6 (L) 11/14/2022   HCT 36.7 (L) 11/14/2022   MCV 103 (H) 11/14/2022   PLT 151 11/14/2022      Component Value Date/Time   NA 139 08/25/2023 1211   K 4.3 08/25/2023 1211   CL 105 08/25/2023 1211   CO2 17 (L) 08/25/2023 1211   GLUCOSE 139 (H) 08/25/2023 1211   GLUCOSE 97 07/08/2013 0856   BUN 25 08/25/2023 1211   CREATININE 1.20 08/25/2023 1211   CREATININE 1.02 07/08/2013 0856   CALCIUM  8.6 08/25/2023 1211   PROT 6.5 07/08/2013 0856   ALBUMIN  4.1 07/08/2013 0856   AST 16 07/08/2013 0856   ALT 21 07/08/2013 0856   ALKPHOS 44 07/08/2013 0856   BILITOT 0.4 07/08/2013 0856   GFRNONAA 63 06/22/2020 1540   GFRAA 73 06/22/2020 1540   Lab Results  Component Value Date   CHOL 122 07/08/2013   HDL 42 07/08/2013   LDLCALC 62 07/08/2013   TRIG 89 07/08/2013   CHOLHDL 2.9 07/08/2013   Lab Results  Component Value Date   HGBA1C 5.5 05/09/2013   Lab Results  Component Value Date   VITAMINB12 312 01/12/2023   Lab Results  Component Value Date   TSH 1.030 01/12/2023        No data to display              10/23/2023    1:54 PM 04/17/2023    3:00 PM 01/12/2023    2:00 PM  Montreal Cognitive Assessment   Visuospatial/ Executive (0/5) 5 5 3   Naming (0/3) 3 3 3   Attention: Read list of digits (0/2) 2 2 2   Attention: Read list of letters (0/1) 1 1 1   Attention: Serial 7 subtraction starting at 100 (0/3) 2 2 3   Language: Repeat phrase (0/2) 2 2 2   Language : Fluency (0/1) 1 1 1   Abstraction (0/2) 2 2 2   Delayed Recall (0/5) 2 0 1  Orientation (0/6) 5 6 5   Total 25 24 23      ASSESSMENT  AND PLAN  78 y.o. year old male  has a past medical history of Ascending aorta dilation (HCC) (09/14/2023), Bursitis of elbow (05/20/2013), Essential hypertension (03/12/2013), GERD (gastroesophageal reflux disease), Hyperlipidemia, Hypertension, Left ventricular diastolic dysfunction, NYHA class 2 (03/12/2013), LVH (left ventricular hypertrophy), PONV (postoperative nausea and vomiting), S/P AVR (aortic valve replacement) (05/13/2013), S/P AVR, 05/13/13, 25 mm Edwards Magna-Ease pericardial valve (05/13/2013), Seasonal allergies, and Severe aortic stenosis (03/12/2013). here with    Parkinson's disease without dyskinesia or fluctuating manifestations (HCC)  Cognitive impairment  Anxiety  Low back pain with sciatica, sciatica laterality unspecified, unspecified back pain laterality, unspecified chronicity  Amato Sevillano has noted increased tremor. Gait seems stable but posture more stooped per Mrs Patras, possibly related to low back pain as well. He has lumbar imaging scheduled next month. Also has reevaluation with PT in July. I have encouraged him to return to PT if willing. We will increase carbidopa -levodopa  to 1 tablet 4 times daily. Advised to take around 7a, 10a, 1p and 5p 30 minutes before meals. He will continue gabapentin  600mg  at bedtime and duloxetine  30mg  daily for now. May consider weaning after MRI. Continue trazodone  50-100mg  at bedtime. Avoid Benadryl. Continue Miralax and increased water intake. Memory compensation strategies advised. Healthy lifestyle habits encouraged. He will follow up with PCP as directed. He will return to see me in 6  months, sooner if needed. He verbalizes understanding and agreement with this plan.   No orders of the defined types were placed in this encounter.    Meds ordered this encounter  Medications   carbidopa -levodopa  (SINEMET  IR) 25-100 MG tablet    Sig: Take 1 tablet by mouth 4 (four) times daily.    Dispense:  360 tablet     Refill:  3    Supervising Provider:   AHERN, ANTONIA B [id number]   I personally spent a total of 40 minutes in the care of the patient today including preparing to see the patient, getting/reviewing separately obtained history, performing a medically appropriate exam/evaluation, counseling and educating, placing orders, and documenting clinical information in the EHR.   Greig Forbes, MSN, FNP-C 10/23/2023, 4:01 PM  [facility] [address], [address] [place], [place] [address] [phone]

## 2023-11-12 DIAGNOSIS — M5416 Radiculopathy, lumbar region: Secondary | ICD-10-CM | POA: Diagnosis not present

## 2023-11-20 DIAGNOSIS — G8929 Other chronic pain: Secondary | ICD-10-CM | POA: Diagnosis not present

## 2023-11-20 DIAGNOSIS — M5442 Lumbago with sciatica, left side: Secondary | ICD-10-CM | POA: Diagnosis not present

## 2023-11-20 DIAGNOSIS — M48062 Spinal stenosis, lumbar region with neurogenic claudication: Secondary | ICD-10-CM | POA: Diagnosis not present

## 2023-11-20 DIAGNOSIS — M5441 Lumbago with sciatica, right side: Secondary | ICD-10-CM | POA: Diagnosis not present

## 2023-11-29 NOTE — Therapy (Signed)
 Physical Therapy Parkinson's Disease Screen   Patient present with wife. Patient reports in January and February things went to hell. Had some chest pain and walking pneumonia and notes some issues with endurance since that time. Reports that he is still going to P[facility] classes 1x/week. Reports that he has also started having bad pains in his legs- was told to see Orthopedist who did an MRI and said that he had either a pinched nerve or problem with his hip.     Timed Up and Go test: 10.88 sec  10 meter walk test: 10.23 sec   5 time sit to stand test:16.27 sec with heavy retropulsion into chair and cueing to stand fully upright *Patient reports increased work of breathing after completing these tests   Patient would benefit from Physical Therapy evaluation due to report of reduced endurance and imbalance with transfers.   [name], PT, DPT 11/30/23 12:37 PM  [facility] [address] [place], [address] [place], [place] [address] Phone # [phone] Fax # [phone]

## 2023-11-30 ENCOUNTER — Encounter: Payer: Self-pay | Admitting: Physical Therapy

## 2023-11-30 ENCOUNTER — Ambulatory Visit: Payer: Medicare HMO

## 2023-11-30 ENCOUNTER — Telehealth: Payer: Self-pay | Admitting: Physical Therapy

## 2023-11-30 ENCOUNTER — Ambulatory Visit: Payer: Medicare HMO | Attending: Neurology | Admitting: Physical Therapy

## 2023-11-30 ENCOUNTER — Ambulatory Visit: Payer: Medicare HMO | Admitting: Occupational Therapy

## 2023-11-30 DIAGNOSIS — R1312 Dysphagia, oropharyngeal phase: Secondary | ICD-10-CM

## 2023-11-30 DIAGNOSIS — R29818 Other symptoms and signs involving the nervous system: Secondary | ICD-10-CM

## 2023-11-30 DIAGNOSIS — R4701 Aphasia: Secondary | ICD-10-CM

## 2023-11-30 DIAGNOSIS — R2689 Other abnormalities of gait and mobility: Secondary | ICD-10-CM | POA: Insufficient documentation

## 2023-11-30 DIAGNOSIS — F801 Expressive language disorder: Secondary | ICD-10-CM

## 2023-11-30 DIAGNOSIS — R2681 Unsteadiness on feet: Secondary | ICD-10-CM

## 2023-11-30 NOTE — Telephone Encounter (Signed)
 Orders Placed This Encounter  Procedures   Ambulatory referral to Physical Therapy   Ambulatory referral to Occupational Therapy   Ambulatory referral to Speech Therapy   SLP modified barium swallow

## 2023-11-30 NOTE — Telephone Encounter (Signed)
 Hi Dr. Onita,  Drew Andrews was seen for PT, OT, speech therapy screens in our multi-disciplinary clinic.  Physical therapy evaluation is recommended for follow up due to reduced endurance and imbalance with transfers. Speech therapy evaluation is recommended for follow up due to anomia  Patient would also benefit from a Modified Barium Swallow, procedure code SLP 1002.    If you agree, please send referral via Epic for PT eval, ST eval, and MBS- SLP 1002.  Thank you.    [name], PT, DPT 11/30/23 1:24 PM  [facility] [address] [place], [address] [place], [place] [address] Phone # [phone] Fax # [phone]

## 2023-11-30 NOTE — Therapy (Signed)
 [place] [facility] [address] [place], [place], [address] Phone: [phone]   Fax:  [phone]  Patient Details  Name: Drew Andrews MRN: [id number] Date of Birth: Oct 06, 1945 Referring Provider:  Vernadine Charlie ORN, MD  Encounter Date: 11/30/2023  Occupational Therapy Parkinson's Disease Screen  Hand dominance:  right  Handwriting: PPT #1: 17.15 sec with 100% legibility and no micrographia    Physical Performance Test item #2 (simulated eating):  12.53 sec  Physical Performance Test item #4 (donning/doffing jacket):  11.2 sec  Fastening/unfastening 3 buttons in:  24.34 sec  9-hole peg test:    RUE  31.91 sec        LUE  27.26 sec   Change in ability to perform ADLs/IADLs:  pt and spouse report not noticing any changes with ADLs  Other Comments:  pt and spouse reports not noticing changes in [facility], but does notice changes in [facility] and balance   Pt does not require occupational therapy services at this time.  Recommended occupational therapy screen in   6-8 months    [place], [place], OT 11/30/2023, 11:22 AM  [place] [facility] [address], [address] [place], [place], [address] Phone: [phone]   Fax:  [phone]

## 2023-11-30 NOTE — Addendum Note (Signed)
 Addended by: Saher Davee on: 11/30/2023 03:06 PM   Modules accepted: Orders

## 2023-11-30 NOTE — Therapy (Signed)
 [place] [facility] [address] [place], [place], [address] Phone: [phone]   Fax:  [phone]  Patient Details  Name: Tajee Savant MRN: [id number] Date of Birth: June 13, 1945 Referring Provider:  Onita Duos, MD  Encounter Date: 11/30/2023  Speech Therapy Parkinson's Disease Screen   Decibel Level today: 71dB  (WNL=70-72 dB) with sound level meter 30cm away from pt's mouth. Pt's conversational volume is WNL.Pt also demonstrated possible deficits in cognition.    Garrel and his wife report difficulty with swallowing, which does warrant further evaluation - pt is having difficulty with daily coughing during meals.   Pt would benefit from cognitive communication evaluation at this clinic, as well as a  modified barium swallow evaluation (procedure code [id number]) at either [facility] or [facility] - please order via EPIC if agreed.    Rusty Villella, CCC-SLP 11/30/2023, 2:09 PM  [place] [facility] [address], [address] [place], [place], [address] Phone: [phone]   Fax:  [phone]

## 2023-12-01 ENCOUNTER — Telehealth (HOSPITAL_COMMUNITY): Payer: Self-pay | Admitting: *Deleted

## 2023-12-01 ENCOUNTER — Other Ambulatory Visit (HOSPITAL_COMMUNITY): Payer: Self-pay | Admitting: *Deleted

## 2023-12-01 DIAGNOSIS — R1310 Dysphagia, unspecified: Secondary | ICD-10-CM

## 2023-12-01 NOTE — Telephone Encounter (Signed)
 Attempted to contact patient to schedule OP MBS. LEft VM @ [phone] and requested a call back. RKEEL

## 2023-12-05 DIAGNOSIS — L578 Other skin changes due to chronic exposure to nonionizing radiation: Secondary | ICD-10-CM | POA: Diagnosis not present

## 2023-12-05 DIAGNOSIS — L738 Other specified follicular disorders: Secondary | ICD-10-CM | POA: Diagnosis not present

## 2023-12-05 DIAGNOSIS — L821 Other seborrheic keratosis: Secondary | ICD-10-CM | POA: Diagnosis not present

## 2023-12-05 NOTE — Therapy (Signed)
 OUTPATIENT PHYSICAL THERAPY NEURO EVALUATION   Patient Name: Drew Andrews MRN: [id number] DOB:[birth year], 78 y.o., male Today's Date: 12/06/2023   PCP: Vernadine Charlie ORN, MD  REFERRING PROVIDER: Onita Duos, MD  END OF SESSION:  PT End of Session - 12/06/23 1151     Visit Number 1    Number of Visits 13    Date for PT Re-Evaluation 01/17/24    Authorization Type [employer]    PT Start Time 1110    PT Stop Time 1147    PT Time Calculation (min) 37 min    Activity Tolerance Patient tolerated treatment well    Behavior During Therapy [facility] for tasks assessed/performed          Past Medical History:  Diagnosis Date   Ascending aorta dilation (HCC) 09/14/2023   TTE 09/13/2023: EF 60-65, no RWMA, GR 1 DD, normal RVSF, trivial MR, moderate MAC, AVR with normal structure and function, mild dilation of ascending aorta (40 mm)    Bursitis of elbow 05/20/2013   Essential hypertension 03/12/2013   GERD (gastroesophageal reflux disease)    Hyperlipidemia    Hypertension    Left ventricular diastolic dysfunction, NYHA class 2 03/12/2013   LVH (left ventricular hypertrophy)    with aortic stenosis-bicuspid   PONV (postoperative nausea and vomiting)    as a child   S/P AVR (aortic valve replacement) 05/13/2013   S/P AVR, 05/13/13, 25 mm Edwards Magna-Ease pericardial valve 05/13/2013   Seasonal allergies    Severe aortic stenosis 03/12/2013   Past Surgical History:  Procedure Laterality Date   AORTIC VALVE REPLACEMENT N/A 05/13/2013   Procedure: AORTIC VALVE REPLACEMENT (AVR);  Surgeon: Dorise MARLA Fellers, MD;  Location: [facility] OR;  Service: Open Heart Surgery;  Laterality: N/A;   APPENDECTOMY  age 53   CARDIAC CATHETERIZATION     COLONOSCOPY     X 2   HIATAL HERNIA REPAIR     INTRAOPERATIVE TRANSESOPHAGEAL ECHOCARDIOGRAM N/A 05/13/2013   Procedure: INTRAOPERATIVE TRANSESOPHAGEAL ECHOCARDIOGRAM;  Surgeon: Dorise MARLA Fellers, MD;  Location: [facility] OR;  Service: Open Heart Surgery;   Laterality: N/A;   LEFT HEART CATHETERIZATION WITH CORONARY ANGIOGRAM N/A 03/15/2013   Procedure: LEFT HEART CATHETERIZATION WITH CORONARY ANGIOGRAM;  Surgeon: Debby DELENA Sor, MD;  Location: [facility] CATH LAB;  Service: Cardiovascular;  Laterality: N/A;   TONSILLECTOMY     TRANSTHORACIC ECHOCARDIOGRAM  03/04/2013   EF 55-60%, grade 2 diastolic dysfunction, AV with mod calcified leaflets & mild regurg, calcified MV annulus, LA mod dilated, RV mildly dilated   Patient Active Problem List   Diagnosis Date Noted   Ascending aorta dilation (HCC) 09/14/2023   CAD (coronary artery disease) 08/09/2023   Low back pain with sciatica 07/07/2023   Anxiety 04/17/2023   Cognitive impairment 01/12/2023   Parkinson's disease without dyskinesia or fluctuating manifestations (HCC) 01/12/2023   GERD (gastroesophageal reflux disease) 11/15/2015   Diplopia 02/21/2014   S/P AVR (aortic valve replacement) 07/13/2013   Rash of back, contact dermatitis resolved 05/31/2013   Bursitis of elbow 05/20/2013   S/P AVR, 05/13/13, 25 mm Edwards Magna-Ease pericardial valve 05/13/2013   Severe aortic stenosis 03/12/2013   Hyperlipidemia 03/12/2013   Essential hypertension 03/12/2013   Left ventricular diastolic dysfunction, NYHA class 2 03/12/2013    ONSET DATE: January 2025   REFERRING DIAG: R13.12 (ICD-10-CM) - Dysphagia, oropharyngeal phase F80.1 (ICD-10-CM) - Expressive language disorder R26.81 (ICD-10-CM) - Unsteadiness on feet  THERAPY DIAG:  Difficulty in walking, not elsewhere classified  Unsteadiness  on feet  Abnormal posture  Muscle weakness (generalized)  Other low back pain  Rationale for Evaluation and Treatment: Rehabilitation  SUBJECTIVE:                                                                                                                                                                                             SUBJECTIVE STATEMENT: Patient reports that he gets really tired since  getting walking pneumonia in January. Reports a hard time catching his breath. Reports that he has a hard time bringing the trash cans back in from the road d/t SOB. Denies recent falls. Reports that he used to walk 20-30 minutes with his walking poles; has not done that since getting pneumonia. Reports that he has also had issues with abdominal pain and it is constant- reports that he is still undergoing testing for this. Worse when reclining, better upright. Pt also reports sometimes he gets dizziness when getting up- last episode was 1-2 weeks ago. Reports that he is taking prednisone and will be getting an injection in his back in 2 weeks.    Pt accompanied by: significant other who assists with subjective hx   PERTINENT HISTORY: HTN, GERD, HLD, HTN, ventricular diastolic dysfunction  PAIN:  Are you having pain? Yes: NPRS scale: I don't hurt bad Pain location: B LEs Pain description: sharp Aggravating factors: walking Relieving factors: Prednisone  PRECAUTIONS: Fall  RED FLAGS: None   WEIGHT BEARING RESTRICTIONS: No  FALLS: Has patient fallen in last 6 months? No  LIVING ENVIRONMENT: Lives with: lives with their spouse Lives in: House/apartment Stairs: 4-5 steps to enter with rail; 2 steps in the back with a handle; 2 story home Has following equipment at home: Walker - 4 wheeled, Grab bars, and walking poles   PLOF: Independent; pt used to risk his bike  PATIENT GOALS: per wife- get pt more active and improve standing tolerance   OBJECTIVE:  Note: Objective measures were completed at Evaluation unless otherwise noted.  DIAGNOSTIC FINDINGS: lumbar xray 07/07/23: Multilevel degenerative disc disease and facet disease.   COGNITION: Overall cognitive status: Within functional limits for tasks assessed   SENSATION: Pt denies N/T in UEs/LEs   COORDINATION: Alternating pronation/supination: B dysdiadochokinesia Alternating toe tap: WFL B Finger to nose: WFL B  MUSCLE  TONE: 2 beats of clonus in L ankle  POSTURE: rounded shoulders, forward head, flexed trunk , and slight L trunk lean  LOWER EXTREMITY ROM:     Active  Right Eval Left Eval  Hip flexion    Hip extension    Hip abduction    Hip adduction  Hip internal rotation    Hip external rotation    Knee flexion    Knee extension    Ankle dorsiflexion 10 deg with knee slightly flexed 4 deg with knee slightly flexed  Ankle plantarflexion    Ankle inversion    Ankle eversion     (Blank rows = not tested)   Vitals checked d/t pt reporting mild SOB from assessment thus far: 105/74 mmHg, 65 bpm, 98% spO2   LOWER EXTREMITY MMT:    MMT *in sitting  Right Eval Left Eval  Hip flexion 4+ 4+  Hip extension    Hip abduction 4 4  Hip adduction 4 4  Hip internal rotation    Hip external rotation    Knee flexion 5 5  Knee extension 5 5  Ankle dorsiflexion 5 5  Ankle plantarflexion 5 5  Ankle inversion    Ankle eversion    (Blank rows = not tested)  GAIT: Findings: Assistive device utilized:None, Level of assistance: Modified independence, and Comments: trunk flexed forward and to L; quick speed but steps are short and discontinuous   FUNCTIONAL TESTS:  5 times sit to stand: 14.74 sec without UE support and cues to stand up fully; 97% spO2                                                                          TREATMENT DATE: 12/07/23    PATIENT EDUCATION: Education details: edu on possible orthostasis and plans to check next session, exam findings, prognosis, POC Person educated: Patient and Spouse Education method: Explanation Education comprehension: verbalized understanding  HOME EXERCISE PROGRAM: Not yet initiated    GOALS: Goals reviewed with patient? Yes  SHORT TERM GOALS: Target date: 12/27/2023  Patient to be independent with initial HEP. Baseline: HEP initiated Goal status: INITIAL    LONG TERM GOALS: Target date: 01/17/2024  Patient to be independent with  advanced HEP. Baseline: Not yet initiated  Goal status: INITIAL  Patient to return to walking for exercise for at least 15 minutes at a time without LBP or SOB limiting.  Baseline: Not yet initiated  Goal status: INITIAL  Patient to score at least 22/30 on FGA in order to decrease risk of falls.   Baseline: NT Goal status: INITIAL  Patient to demonstrate STS with 8 out of 10 reps without retropulsion and with upright posture.  Baseline: tendency not to stand fully upright Goal status: INITIAL  goal to be created. Baseline: Not yet initiated Goal status: INITIAL  Patient to report tolerance for 30 minutes of standing without LBP limiting in order to prepare a meal.  Baseline: reports unable Goal status: INITIAL    ASSESSMENT:  CLINICAL IMPRESSION:  Patient is a 78 y/o M presenting to OPPT with wife with c/o LBP and limited endurance s/p pneumonia in January 2025. Patient today presenting with limited B UE coordination, L LE clonus, flexed posture, limited L ankle dorsiflexion AROM, hip weakness, and gait deviations. Would benefit from skilled PT services 1-2 x/week for 6 weeks to address aforementioned impairments in order to optimize level of function.    OBJECTIVE IMPAIRMENTS: Abnormal gait, cardiopulmonary status limiting activity, decreased activity tolerance, decreased balance, decreased coordination, decreased endurance, decreased knowledge  of use of DME, difficulty walking, decreased ROM, decreased strength, dizziness, impaired flexibility, impaired tone, improper body mechanics, postural dysfunction, and pain.   ACTIVITY LIMITATIONS: carrying, lifting, bending, sitting, standing, squatting, stairs, transfers, bathing, toileting, dressing, reach over head, hygiene/grooming, locomotion level, and caring for others  PARTICIPATION LIMITATIONS: meal prep, cleaning, laundry, shopping, community activity, yard work, and church  PERSONAL FACTORS: Age, Past/current  experiences, Time since onset of injury/illness/exacerbation, and 3+ comorbidities: HTN, GERD, HLD, HTN, ventricular diastolic dysfunction are also affecting patient's functional outcome.   REHAB POTENTIAL: Good  CLINICAL DECISION MAKING: Evolving/moderate complexity  EVALUATION COMPLEXITY: Moderate  PLAN:  PT FREQUENCY: 1-2x/week  PT DURATION: 6 weeks  PLANNED INTERVENTIONS: 97164- PT Re-evaluation, 97750- Physical Performance Testing, 97110-Therapeutic exercises, 97530- Therapeutic activity, 97112- Neuromuscular re-education, 97535- Self Care, [id number]- Manual therapy, [phone]- Gait training, [phone]- Canalith repositioning, [id number]- Aquatic Therapy, [phone] (1-2 muscles), [id number] (3+ muscles)- Dry Needling, Patient/Family education, Balance training, Stair training, Taping, Vestibular training, DME instructions, Cryotherapy, and Moist heat  PLAN FOR NEXT SESSION: check orthostasis, and create goal, FGA; initiate HEP for hip and core strengthening, posture, balance    [name], PT, DPT 12/06/23 12:02 PM  [facility] [address], [address] [place], [place] [address] Phone # [phone] Fax # [phone]

## 2023-12-06 ENCOUNTER — Ambulatory Visit: Attending: Neurology | Admitting: Physical Therapy

## 2023-12-06 ENCOUNTER — Encounter: Payer: Self-pay | Admitting: Physical Therapy

## 2023-12-06 ENCOUNTER — Other Ambulatory Visit: Payer: Self-pay

## 2023-12-06 DIAGNOSIS — R262 Difficulty in walking, not elsewhere classified: Secondary | ICD-10-CM | POA: Insufficient documentation

## 2023-12-06 DIAGNOSIS — R4701 Aphasia: Secondary | ICD-10-CM | POA: Insufficient documentation

## 2023-12-06 DIAGNOSIS — R471 Dysarthria and anarthria: Secondary | ICD-10-CM | POA: Diagnosis not present

## 2023-12-06 DIAGNOSIS — R2681 Unsteadiness on feet: Secondary | ICD-10-CM | POA: Insufficient documentation

## 2023-12-06 DIAGNOSIS — R41841 Cognitive communication deficit: Secondary | ICD-10-CM | POA: Diagnosis not present

## 2023-12-06 DIAGNOSIS — M5459 Other low back pain: Secondary | ICD-10-CM | POA: Diagnosis not present

## 2023-12-06 DIAGNOSIS — M6281 Muscle weakness (generalized): Secondary | ICD-10-CM | POA: Insufficient documentation

## 2023-12-06 DIAGNOSIS — F801 Expressive language disorder: Secondary | ICD-10-CM | POA: Diagnosis not present

## 2023-12-06 DIAGNOSIS — R293 Abnormal posture: Secondary | ICD-10-CM | POA: Diagnosis not present

## 2023-12-06 DIAGNOSIS — R1312 Dysphagia, oropharyngeal phase: Secondary | ICD-10-CM | POA: Insufficient documentation

## 2023-12-07 ENCOUNTER — Other Ambulatory Visit: Payer: Self-pay

## 2023-12-07 ENCOUNTER — Ambulatory Visit

## 2023-12-07 DIAGNOSIS — R4701 Aphasia: Secondary | ICD-10-CM | POA: Diagnosis not present

## 2023-12-07 DIAGNOSIS — R471 Dysarthria and anarthria: Secondary | ICD-10-CM

## 2023-12-07 DIAGNOSIS — R2681 Unsteadiness on feet: Secondary | ICD-10-CM | POA: Diagnosis not present

## 2023-12-07 DIAGNOSIS — R293 Abnormal posture: Secondary | ICD-10-CM | POA: Diagnosis not present

## 2023-12-07 DIAGNOSIS — R1312 Dysphagia, oropharyngeal phase: Secondary | ICD-10-CM

## 2023-12-07 DIAGNOSIS — M5459 Other low back pain: Secondary | ICD-10-CM | POA: Diagnosis not present

## 2023-12-07 DIAGNOSIS — F801 Expressive language disorder: Secondary | ICD-10-CM | POA: Diagnosis not present

## 2023-12-07 DIAGNOSIS — M6281 Muscle weakness (generalized): Secondary | ICD-10-CM | POA: Diagnosis not present

## 2023-12-07 DIAGNOSIS — R262 Difficulty in walking, not elsewhere classified: Secondary | ICD-10-CM | POA: Diagnosis not present

## 2023-12-07 DIAGNOSIS — R41841 Cognitive communication deficit: Secondary | ICD-10-CM | POA: Diagnosis not present

## 2023-12-07 NOTE — Therapy (Signed)
 OUTPATIENT SPEECH LANGUAGE PATHOLOGY [facility] EVALUATION   Patient Name: Drew Andrews MRN: [id number] DOB:1946/02/12, 78 y.o., male Today's Date: 12/07/2023  PCP: Vernadine Ade, MD REFERRING PROVIDER: Onita Duos, MD  END OF SESSION:  End of Session - 12/07/23 1751     Visit Number 1    Number of Visits 17    Date for SLP Re-Evaluation 03/06/24   due to first ST not until 01/15/24   SLP Start Time 1450    SLP Stop Time  1531    SLP Time Calculation (min) 41 min    Activity Tolerance Patient tolerated treatment well          Past Medical History:  Diagnosis Date   Ascending aorta dilation (HCC) 09/14/2023   TTE 09/13/2023: EF 60-65, no RWMA, GR 1 DD, normal RVSF, trivial MR, moderate MAC, AVR with normal structure and function, mild dilation of ascending aorta (40 mm)    Bursitis of elbow 05/20/2013   Essential hypertension 03/12/2013   GERD (gastroesophageal reflux disease)    Hyperlipidemia    Hypertension    Left ventricular diastolic dysfunction, NYHA class 2 03/12/2013   LVH (left ventricular hypertrophy)    with aortic stenosis-bicuspid   PONV (postoperative nausea and vomiting)    as a child   S/P AVR (aortic valve replacement) 05/13/2013   S/P AVR, 05/13/13, 25 mm Edwards Magna-Ease pericardial valve 05/13/2013   Seasonal allergies    Severe aortic stenosis 03/12/2013   Past Surgical History:  Procedure Laterality Date   AORTIC VALVE REPLACEMENT N/A 05/13/2013   Procedure: AORTIC VALVE REPLACEMENT (AVR);  Surgeon: Dorise MARLA Fellers, MD;  Location: [facility] OR;  Service: Open Heart Surgery;  Laterality: N/A;   APPENDECTOMY  age 63   CARDIAC CATHETERIZATION     COLONOSCOPY     X 2   HIATAL HERNIA REPAIR     INTRAOPERATIVE TRANSESOPHAGEAL ECHOCARDIOGRAM N/A 05/13/2013   Procedure: INTRAOPERATIVE TRANSESOPHAGEAL ECHOCARDIOGRAM;  Surgeon: Dorise MARLA Fellers, MD;  Location: [facility] OR;  Service: Open Heart Surgery;  Laterality: N/A;   LEFT HEART CATHETERIZATION WITH  CORONARY ANGIOGRAM N/A 03/15/2013   Procedure: LEFT HEART CATHETERIZATION WITH CORONARY ANGIOGRAM;  Surgeon: Debby DELENA Sor, MD;  Location: [facility] CATH LAB;  Service: Cardiovascular;  Laterality: N/A;   TONSILLECTOMY     TRANSTHORACIC ECHOCARDIOGRAM  03/04/2013   EF 55-60%, grade 2 diastolic dysfunction, AV with mod calcified leaflets & mild regurg, calcified MV annulus, LA mod dilated, RV mildly dilated   Patient Active Problem List   Diagnosis Date Noted   Ascending aorta dilation (HCC) 09/14/2023   CAD (coronary artery disease) 08/09/2023   Low back pain with sciatica 07/07/2023   Anxiety 04/17/2023   Cognitive impairment 01/12/2023   Parkinson's disease without dyskinesia or fluctuating manifestations (HCC) 01/12/2023   GERD (gastroesophageal reflux disease) 11/15/2015   Diplopia 02/21/2014   S/P AVR (aortic valve replacement) 07/13/2013   Rash of back, contact dermatitis resolved 05/31/2013   Bursitis of elbow 05/20/2013   S/P AVR, 05/13/13, 25 mm Edwards Magna-Ease pericardial valve 05/13/2013   Severe aortic stenosis 03/12/2013   Hyperlipidemia 03/12/2013   Essential hypertension 03/12/2013   Left ventricular diastolic dysfunction, NYHA class 2 03/12/2013    ONSET DATE: script 11/30/23  REFERRING DIAG: R47.01 (ICD-10-CM) - Aphasia R13.12 (ICD-10-CM) - Dysphagia, oropharyngeal phase F80.1 (ICD-10-CM) - Expressive language disorder R26.81 (ICD-10-CM) - Unsteadiness on feet  THERAPY DIAG:  Dysarthria and anarthria  Dysphagia, oropharyngeal phase  Cognitive communication deficit  Rationale for Evaluation and  Treatment: Rehabilitation  SUBJECTIVE:   SUBJECTIVE STATEMENT: PNA in early 2025.  Pt accompanied by: significant other  PERTINENT HISTORY: See above  PAIN:  Are you having pain? Yes: NPRS scale: 1-2 Pain location: lt abdomen Pain description: constant Aggravating factors: leaning back Relieving factors: IDK  FALLS: Has patient fallen in last 6 months?  See PT  evaluation for details  LIVING ENVIRONMENT: Lives with: lives with their spouse Lives in: House/apartment  PLOF:  Level of assistance: Independent with ADLs, Independent with IADLs Employment: Retired from [employer]  PATIENT GOALS: get myself better  OBJECTIVE:  Note: Objective measures were completed at Evaluation unless otherwise noted.  DIAGNOSTIC FINDINGS:  Pt was screened on 11/30/23 and suspected to have cognitive communication deficits and reported dysphagia.   MRI brain 02/16/23 IMPRESSION: This MRI of the brain without contrast shows the following: Mild to moderate generalized cortical atrophy without a lobar predominance.  The extent is more than typical for age. Few scattered T2/FLAIR hyperintense foci in the cerebral hemispheres consistent with minimal chronic microvascular ischemic change that would be typical for age No acute findings.  COGNITION: Overall cognitive status: Impaired Comments: Assessment in first 1-2 sessions  MOTOR SPEECH: Overall motor speech: impaired Level of impairment: Conversation Respiration: thoracic breathing Phonation: rough, intermittently Resonance: WFL Articulation: Appears intact Intelligibility: Intelligible Interfering components: premorbid status (dx PD)  ORAL MOTOR EXAMINATION: Overall status: Impaired: Labial: Bilateral (Coordination) Lingual: Bilateral (Coordination) Facial: Right (Symmetry) (eye muscles appear more weak-slight; eye openings are asymmetrical  OBJECTIVE VOICE ASSESSMENT: Sustained ah maximum phonation time: 32 seconds Sustained ah loudness average: 76 dB Oral reading (passage) loudness average: 73 dB Oral reading loudness range: 57-82 dB Conversational loudness average: 73 dB Conversational loudness range: 58-84 dB Voice quality: rough (intermittent) Comments: Pt with intermittent rough voice throughout eval. Wife to see if this occurs more often after 4 pm (exacerbated  by fatigue?).  CLINICAL SWALLOW ASSESSMENT:   Dentition: adequate natural dentition Vocal quality at baseline: rough, intermittent Patient directly observed with POs: Yes: regular and thin liquids  Feeding: able to feed self Liquids provided by: cup Oral phase signs and symptoms: none noted today Pharyngeal phase signs and symptoms: none noted toda Comments: Coughing with meals occurs more mid meal and after the meal, per wife. Pt states that about 15 years ago he had GI surgery for a problem where the esophagus meets the stomach. Pt had distal esophageal dilation in 2002 with Dr. Luis.   Completed audio recording of patients baseline voice without cueing from SLP: No   PATIENT REPORTED OUTCOME MEASURES (PROM): EAT-10:   and Cognitive Function: to be provided in first 4 sessions                                                                                                                            TREATMENT DATE:   12/07/23: n/a  PATIENT EDUCATION: Education details: breath support needed for ST, talk to ENT/refer to ENT  about nose closing up Person educated: Patient and Spouse Education method: Explanation Education comprehension: verbalized understanding and needs further education  HOME EXERCISE PROGRAM: Eventually for AB, and for swallowing (PRN)   GOALS: Goals reviewed with patient? No  SHORT TERM GOALS: Target date: 02/09/24  Pt will achieve AB at rest 80% of the time in 3 sessions Baseline: Goal status: INITIAL  2.  Pt will achieve AB 80% in sentence responses with min nonverbal cues in 2 sessions Baseline:  Goal status: INITIAL  3.  Pt will complete cognitive eval in first 2 sessions Baseline:  Goal status: INITIAL  4.  Pt will complete dysphagia HEP and strategies from MBS on 12/18/23 with rare min A in 3 sessions Baseline:  Goal status: INITIAL   LONG TERM GOALS: Target date: 03/06/24  Pt will improve PROM compared to initial  administration Baseline:  Goal status: INITIAL  2.  Pt will complete dysphagia HEP and strategies from MBS on 12/18/23 with mod I in 3 sessions Baseline:  Goal status: INITIAL  3.  Pt will achieve AB 80% in 5 minutes simple conversation with min nonverbal cues in 2 sessions Baseline:  Goal status: INITIAL   ASSESSMENT:  CLINICAL IMPRESSION: Patient is a 78 y.o. M who was seen today for assessment of swallowing, and dysarthria (decr'd breath support for speech) c/b rough voice intermittently. Pt's performance today was WFL/WNL; Wife states pt's coughing is mid meal or after a meal. Awaiting results of MBS on 12/18/23, goal/s added PRN. Pt demonstrated thoracic breathing and rough voice intermittently today - SLP suspects lack of breath support for speech - pt to learn AB to improve breath support for speech. Today pt demonstrated s/sx of deficits in attention, memory, and awareness; Cognitive communication eval to follow, goal/s added PRN.   OBJECTIVE IMPAIRMENTS: Objective impairments include attention, memory, awareness, voice disorder, and dysphagia. These impairments are limiting patient from household responsibilities, ADLs/IADLs, effectively communicating at home and in community, and safety when swallowing.Factors affecting potential to achieve goals and functional outcome are ability to learn/carryover information.. Patient will benefit from skilled SLP services to address above impairments and improve overall function.  REHAB POTENTIAL: Good  PLAN:  SLP FREQUENCY: 2x/week  SLP DURATION: 8 weeks - pt did not schedule first ST until 01/15/24  PLANNED INTERVENTIONS: Aspiration precaution training, Pharyngeal strengthening exercises, Diet toleration management , Environmental controls, Trials of upgraded texture/liquids, Cueing hierachy, Cognitive reorganization, Internal/external aids, Oral motor exercises, Functional tasks, SLP instruction and feedback, Compensatory strategies,  Patient/family education, [phone] Treatment of speech (30 or 45 min) , and [id number] Treatment of swallowing function    Jelisha Weed, CCC-SLP 12/07/2023, 6:07 PM   .oprcslps

## 2023-12-08 ENCOUNTER — Ambulatory Visit

## 2023-12-08 DIAGNOSIS — F801 Expressive language disorder: Secondary | ICD-10-CM | POA: Diagnosis not present

## 2023-12-08 DIAGNOSIS — R293 Abnormal posture: Secondary | ICD-10-CM

## 2023-12-08 DIAGNOSIS — R471 Dysarthria and anarthria: Secondary | ICD-10-CM | POA: Diagnosis not present

## 2023-12-08 DIAGNOSIS — M6281 Muscle weakness (generalized): Secondary | ICD-10-CM | POA: Diagnosis not present

## 2023-12-08 DIAGNOSIS — R262 Difficulty in walking, not elsewhere classified: Secondary | ICD-10-CM | POA: Diagnosis not present

## 2023-12-08 DIAGNOSIS — M5459 Other low back pain: Secondary | ICD-10-CM | POA: Diagnosis not present

## 2023-12-08 DIAGNOSIS — R41841 Cognitive communication deficit: Secondary | ICD-10-CM | POA: Diagnosis not present

## 2023-12-08 DIAGNOSIS — R2681 Unsteadiness on feet: Secondary | ICD-10-CM

## 2023-12-08 DIAGNOSIS — R1312 Dysphagia, oropharyngeal phase: Secondary | ICD-10-CM | POA: Diagnosis not present

## 2023-12-08 DIAGNOSIS — R4701 Aphasia: Secondary | ICD-10-CM | POA: Diagnosis not present

## 2023-12-08 NOTE — Therapy (Signed)
 OUTPATIENT PHYSICAL THERAPY NEURO TREATMENT   Patient Name: Drew Andrews MRN: [id number] DOB:[birth year], 78 y.o., male Today's Date: 12/08/2023   PCP: Vernadine Charlie ORN, MD  REFERRING PROVIDER: Onita Duos, MD  END OF SESSION:  PT End of Session - 12/08/23 0852     Visit Number 2    Number of Visits 13    Date for PT Re-Evaluation 01/17/24    Authorization Type [employer]    PT Start Time [phone]    PT Stop Time 0930    PT Time Calculation (min) 40 min    Activity Tolerance Patient tolerated treatment well    Behavior During Therapy [facility] for tasks assessed/performed          Past Medical History:  Diagnosis Date   Ascending aorta dilation (HCC) 09/14/2023   TTE 09/13/2023: EF 60-65, no RWMA, GR 1 DD, normal RVSF, trivial MR, moderate MAC, AVR with normal structure and function, mild dilation of ascending aorta (40 mm)    Bursitis of elbow 05/20/2013   Essential hypertension 03/12/2013   GERD (gastroesophageal reflux disease)    Hyperlipidemia    Hypertension    Left ventricular diastolic dysfunction, NYHA class 2 03/12/2013   LVH (left ventricular hypertrophy)    with aortic stenosis-bicuspid   PONV (postoperative nausea and vomiting)    as a child   S/P AVR (aortic valve replacement) 05/13/2013   S/P AVR, 05/13/13, 25 mm Edwards Magna-Ease pericardial valve 05/13/2013   Seasonal allergies    Severe aortic stenosis 03/12/2013   Past Surgical History:  Procedure Laterality Date   AORTIC VALVE REPLACEMENT N/A 05/13/2013   Procedure: AORTIC VALVE REPLACEMENT (AVR);  Surgeon: Dorise MARLA Fellers, MD;  Location: [facility] OR;  Service: Open Heart Surgery;  Laterality: N/A;   APPENDECTOMY  age 65   CARDIAC CATHETERIZATION     COLONOSCOPY     X 2   HIATAL HERNIA REPAIR     INTRAOPERATIVE TRANSESOPHAGEAL ECHOCARDIOGRAM N/A 05/13/2013   Procedure: INTRAOPERATIVE TRANSESOPHAGEAL ECHOCARDIOGRAM;  Surgeon: Dorise MARLA Fellers, MD;  Location: [facility] OR;  Service: Open Heart Surgery;   Laterality: N/A;   LEFT HEART CATHETERIZATION WITH CORONARY ANGIOGRAM N/A 03/15/2013   Procedure: LEFT HEART CATHETERIZATION WITH CORONARY ANGIOGRAM;  Surgeon: Debby DELENA Sor, MD;  Location: [facility] CATH LAB;  Service: Cardiovascular;  Laterality: N/A;   TONSILLECTOMY     TRANSTHORACIC ECHOCARDIOGRAM  03/04/2013   EF 55-60%, grade 2 diastolic dysfunction, AV with mod calcified leaflets & mild regurg, calcified MV annulus, LA mod dilated, RV mildly dilated   Patient Active Problem List   Diagnosis Date Noted   Ascending aorta dilation (HCC) 09/14/2023   CAD (coronary artery disease) 08/09/2023   Low back pain with sciatica 07/07/2023   Anxiety 04/17/2023   Cognitive impairment 01/12/2023   Parkinson's disease without dyskinesia or fluctuating manifestations (HCC) 01/12/2023   GERD (gastroesophageal reflux disease) 11/15/2015   Diplopia 02/21/2014   S/P AVR (aortic valve replacement) 07/13/2013   Rash of back, contact dermatitis resolved 05/31/2013   Bursitis of elbow 05/20/2013   S/P AVR, 05/13/13, 25 mm Edwards Magna-Ease pericardial valve 05/13/2013   Severe aortic stenosis 03/12/2013   Hyperlipidemia 03/12/2013   Essential hypertension 03/12/2013   Left ventricular diastolic dysfunction, NYHA class 2 03/12/2013    ONSET DATE: January 2025   REFERRING DIAG: R13.12 (ICD-10-CM) - Dysphagia, oropharyngeal phase F80.1 (ICD-10-CM) - Expressive language disorder R26.81 (ICD-10-CM) - Unsteadiness on feet  THERAPY DIAG:  Difficulty in walking, not elsewhere classified  Unsteadiness  on feet  Abnormal posture  Muscle weakness (generalized)  Other low back pain  Rationale for Evaluation and Treatment: Rehabilitation  SUBJECTIVE:                                                                                                                                                                                             SUBJECTIVE STATEMENT: Doing ok. Nothing new to report   Pt  accompanied by: significant other who assists with subjective hx   PERTINENT HISTORY: HTN, GERD, HLD, HTN, ventricular diastolic dysfunction  PAIN:  Are you having pain? Yes: NPRS scale: I don't hurt bad Pain location: B LEs Pain description: sharp Aggravating factors: walking Relieving factors: Prednisone  PRECAUTIONS: Fall  RED FLAGS: None   WEIGHT BEARING RESTRICTIONS: No  FALLS: Has patient fallen in last 6 months? No  LIVING ENVIRONMENT: Lives with: lives with their spouse Lives in: House/apartment Stairs: 4-5 steps to enter with rail; 2 steps in the back with a handle; 2 story home Has following equipment at home: Vannie - 4 wheeled, Grab bars, and walking poles   PLOF: Independent; pt used to risk his bike  PATIENT GOALS: per wife- get pt more active and improve standing tolerance   OBJECTIVE:   TODAY'S TREATMENT: 12/08/23 Activity Comments  orthostatics See below--no lightheadedness reported with initial arising  See below  Seated forward flexion stretch 2x15 sec  SKTC/DKTC 10 deep breaths         OTHOSTATICS: Supine: 159/98 with HR 58 Standing x 1 min: 107/75 with HR 72  Standing x 3 min: 103/75 with HR 72   [facility] PT Assessment - 12/08/23 0001       6 Minute Walk- Baseline   6 Minute Walk- Baseline yes    BP (mmHg) 107/75    HR (bpm) 72    02 Sat (%RA) 98 %      6 Minute walk- Post Test   6 Minute Walk Post Test yes    BP (mmHg) 133/77    HR (bpm) 67    02 Sat (%RA) 97 %    Modified Borg Scale for Dyspnea 8-      6 minute walk test results    Aerobic Endurance Distance Walked 765          PATIENT EDUCATION: Education details: edu on possible orthostasis and plans to check next session, exam findings, prognosis, POC Person educated: Patient and Spouse Education method: Explanation Education comprehension: verbalized understanding  HOME EXERCISE PROGRAM: Access Code: [email or web address] URL: [email or web address] Date:  12/08/2023 Prepared by: Kelly Giulianna Rocha  Exercises - Seated Lumbar Flexion Stretch  -  1 x daily - 7 x weekly - 1 sets - 10 reps - deep breaths hold - Hooklying Single Knee to Chest  - 1 x daily - 7 x weekly - 3 sets - 10 deep breaths hold - Supine Double Knee to Chest  - 1 x daily - 7 x weekly - 3 sets - 10 deep breaths hold Note: Objective measures were completed at Evaluation unless otherwise noted.  DIAGNOSTIC FINDINGS: lumbar xray 07/07/23: Multilevel degenerative disc disease and facet disease.   COGNITION: Overall cognitive status: Within functional limits for tasks assessed   SENSATION: Pt denies N/T in UEs/LEs   COORDINATION: Alternating pronation/supination: B dysdiadochokinesia Alternating toe tap: WFL B Finger to nose: WFL B  MUSCLE TONE: 2 beats of clonus in L ankle  POSTURE: rounded shoulders, forward head, flexed trunk , and slight L trunk lean  LOWER EXTREMITY ROM:     Active  Right Eval Left Eval  Hip flexion    Hip extension    Hip abduction    Hip adduction    Hip internal rotation    Hip external rotation    Knee flexion    Knee extension    Ankle dorsiflexion 10 deg with knee slightly flexed 4 deg with knee slightly flexed  Ankle plantarflexion    Ankle inversion    Ankle eversion     (Blank rows = not tested)   Vitals checked d/t pt reporting mild SOB from assessment thus far: 105/74 mmHg, 65 bpm, 98% spO2   LOWER EXTREMITY MMT:    MMT *in sitting  Right Eval Left Eval  Hip flexion 4+ 4+  Hip extension    Hip abduction 4 4  Hip adduction 4 4  Hip internal rotation    Hip external rotation    Knee flexion 5 5  Knee extension 5 5  Ankle dorsiflexion 5 5  Ankle plantarflexion 5 5  Ankle inversion    Ankle eversion    (Blank rows = not tested)  GAIT: Findings: Assistive device utilized:None, Level of assistance: Modified independence, and Comments: trunk flexed forward and to L; quick speed but steps are short and discontinuous    FUNCTIONAL TESTS:  5 times sit to stand: 14.74 sec without UE support and cues to stand up fully; 97% spO2                                                                          TREATMENT DATE: 12/07/23      GOALS: Goals reviewed with patient? Yes  SHORT TERM GOALS: Target date: 12/27/2023  Patient to be independent with initial HEP. Baseline: HEP initiated Goal status: INITIAL    LONG TERM GOALS: Target date: 01/17/2024  Patient to be independent with advanced HEP. Baseline: Not yet initiated  Goal status: INITIAL  Patient to return to walking for exercise for at least 15 minutes at a time without LBP or SOB limiting.  Baseline: Not yet initiated  Goal status: INITIAL  Patient to score at least 22/30 on FGA in order to decrease risk of falls.   Baseline: NT Goal status: INITIAL  Patient to demonstrate STS with 8 out of 10 reps without retropulsion and with upright posture.  Baseline: tendency  not to stand fully upright Goal status: INITIAL  Demo improved endurance and gait speed per distance 1,035 ft during Baseline: 765 ft RPE 8/10 Goal status: INITIAL  Patient to report tolerance for 30 minutes of standing without LBP limiting in order to prepare a meal.  Baseline: reports unable Goal status: INITIAL    ASSESSMENT:  CLINICAL IMPRESSION:  Orthostatics assessment reveals Supine: 159/98 with HR 58 Standing x 1 min: 107/75 with HR 72  Standing x 3 min: 103/75 with HR 72 but pt denies any feeling of lightheadedness/near syncope.  conducted for total distance of 765 ft w/ RPE of 8/10 without abnormal vital sign response appreciated.  Pt reports ongoing issue of LBP and what sounds to be neurogenic claudication.  Discussed relevant anatomy/physiology of lumbar stenosis and flexion bias for positioning and movement to help alleviate and provided rehearsal and handout for lumbar flexion stretching. Continued sessions to progress POC details to improve  mobility, motor control, and reduce risk for falls.    OBJECTIVE IMPAIRMENTS: Abnormal gait, cardiopulmonary status limiting activity, decreased activity tolerance, decreased balance, decreased coordination, decreased endurance, decreased knowledge of use of DME, difficulty walking, decreased ROM, decreased strength, dizziness, impaired flexibility, impaired tone, improper body mechanics, postural dysfunction, and pain.   ACTIVITY LIMITATIONS: carrying, lifting, bending, sitting, standing, squatting, stairs, transfers, bathing, toileting, dressing, reach over head, hygiene/grooming, locomotion level, and caring for others  PARTICIPATION LIMITATIONS: meal prep, cleaning, laundry, shopping, community activity, yard work, and church  PERSONAL FACTORS: Age, Past/current experiences, Time since onset of injury/illness/exacerbation, and 3+ comorbidities: HTN, GERD, HLD, HTN, ventricular diastolic dysfunction are also affecting patient's functional outcome.   REHAB POTENTIAL: Good  CLINICAL DECISION MAKING: Evolving/moderate complexity  EVALUATION COMPLEXITY: Moderate  PLAN:  PT FREQUENCY: 1-2x/week  PT DURATION: 6 weeks  PLANNED INTERVENTIONS: 97164- PT Re-evaluation, 97750- Physical Performance Testing, 97110-Therapeutic exercises, 97530- Therapeutic activity, [id number]- Neuromuscular re-education, 97535- Self Care, [id number]- Manual therapy, [id number]- Gait training, [phone]- Canalith repositioning, [id number]- Aquatic Therapy, [phone] (1-2 muscles), 20561 (3+ muscles)- Dry Needling, Patient/Family education, Balance training, Stair training, Taping, Vestibular training, DME instructions, Cryotherapy, and Moist heat  PLAN FOR NEXT SESSION:   FGA; initiate HEP for hip and core strengthening, posture, balance    11:25 AM, 12/08/23 M. Kelly Arushi Partridge, PT, DPT Physical Therapist- [place] Office Number: [phone]

## 2023-12-12 ENCOUNTER — Inpatient Hospital Stay (HOSPITAL_COMMUNITY): Admission: RE | Admit: 2023-12-12 | Source: Ambulatory Visit

## 2023-12-12 ENCOUNTER — Ambulatory Visit

## 2023-12-12 DIAGNOSIS — R1312 Dysphagia, oropharyngeal phase: Secondary | ICD-10-CM | POA: Diagnosis not present

## 2023-12-12 DIAGNOSIS — R471 Dysarthria and anarthria: Secondary | ICD-10-CM | POA: Diagnosis not present

## 2023-12-12 DIAGNOSIS — R293 Abnormal posture: Secondary | ICD-10-CM | POA: Diagnosis not present

## 2023-12-12 DIAGNOSIS — M5459 Other low back pain: Secondary | ICD-10-CM

## 2023-12-12 DIAGNOSIS — M6281 Muscle weakness (generalized): Secondary | ICD-10-CM | POA: Diagnosis not present

## 2023-12-12 DIAGNOSIS — R4701 Aphasia: Secondary | ICD-10-CM | POA: Diagnosis not present

## 2023-12-12 DIAGNOSIS — R2681 Unsteadiness on feet: Secondary | ICD-10-CM

## 2023-12-12 DIAGNOSIS — R262 Difficulty in walking, not elsewhere classified: Secondary | ICD-10-CM

## 2023-12-12 DIAGNOSIS — F801 Expressive language disorder: Secondary | ICD-10-CM | POA: Diagnosis not present

## 2023-12-12 DIAGNOSIS — R41841 Cognitive communication deficit: Secondary | ICD-10-CM | POA: Diagnosis not present

## 2023-12-12 NOTE — Therapy (Signed)
 OUTPATIENT PHYSICAL THERAPY NEURO TREATMENT   Patient Name: Drew Andrews MRN: [id number] DOB:[birth year], 78 y.o., male Today's Date: 12/12/2023   PCP: Vernadine Charlie ORN, MD  REFERRING PROVIDER: Onita Duos, MD  END OF SESSION:  PT End of Session - 12/12/23 1145     Visit Number 3    Number of Visits 13    Date for PT Re-Evaluation 01/17/24    Authorization Type [employer]    PT Start Time 1145    PT Stop Time 1230    PT Time Calculation (min) 45 min    Activity Tolerance Patient tolerated treatment well    Behavior During Therapy [facility] for tasks assessed/performed          Past Medical History:  Diagnosis Date   Ascending aorta dilation (HCC) 09/14/2023   TTE 09/13/2023: EF 60-65, no RWMA, GR 1 DD, normal RVSF, trivial MR, moderate MAC, AVR with normal structure and function, mild dilation of ascending aorta (40 mm)    Bursitis of elbow 05/20/2013   Essential hypertension 03/12/2013   GERD (gastroesophageal reflux disease)    Hyperlipidemia    Hypertension    Left ventricular diastolic dysfunction, NYHA class 2 03/12/2013   LVH (left ventricular hypertrophy)    with aortic stenosis-bicuspid   PONV (postoperative nausea and vomiting)    as a child   S/P AVR (aortic valve replacement) 05/13/2013   S/P AVR, 05/13/13, 25 mm Edwards Magna-Ease pericardial valve 05/13/2013   Seasonal allergies    Severe aortic stenosis 03/12/2013   Past Surgical History:  Procedure Laterality Date   AORTIC VALVE REPLACEMENT N/A 05/13/2013   Procedure: AORTIC VALVE REPLACEMENT (AVR);  Surgeon: Dorise MARLA Fellers, MD;  Location: [facility] OR;  Service: Open Heart Surgery;  Laterality: N/A;   APPENDECTOMY  age 57   CARDIAC CATHETERIZATION     COLONOSCOPY     X 2   HIATAL HERNIA REPAIR     INTRAOPERATIVE TRANSESOPHAGEAL ECHOCARDIOGRAM N/A 05/13/2013   Procedure: INTRAOPERATIVE TRANSESOPHAGEAL ECHOCARDIOGRAM;  Surgeon: Dorise MARLA Fellers, MD;  Location: [facility] OR;  Service: Open Heart Surgery;   Laterality: N/A;   LEFT HEART CATHETERIZATION WITH CORONARY ANGIOGRAM N/A 03/15/2013   Procedure: LEFT HEART CATHETERIZATION WITH CORONARY ANGIOGRAM;  Surgeon: Debby DELENA Sor, MD;  Location: [facility] CATH LAB;  Service: Cardiovascular;  Laterality: N/A;   TONSILLECTOMY     TRANSTHORACIC ECHOCARDIOGRAM  03/04/2013   EF 55-60%, grade 2 diastolic dysfunction, AV with mod calcified leaflets & mild regurg, calcified MV annulus, LA mod dilated, RV mildly dilated   Patient Active Problem List   Diagnosis Date Noted   Ascending aorta dilation (HCC) 09/14/2023   CAD (coronary artery disease) 08/09/2023   Low back pain with sciatica 07/07/2023   Anxiety 04/17/2023   Cognitive impairment 01/12/2023   Parkinson's disease without dyskinesia or fluctuating manifestations (HCC) 01/12/2023   GERD (gastroesophageal reflux disease) 11/15/2015   Diplopia 02/21/2014   S/P AVR (aortic valve replacement) 07/13/2013   Rash of back, contact dermatitis resolved 05/31/2013   Bursitis of elbow 05/20/2013   S/P AVR, 05/13/13, 25 mm Edwards Magna-Ease pericardial valve 05/13/2013   Severe aortic stenosis 03/12/2013   Hyperlipidemia 03/12/2013   Essential hypertension 03/12/2013   Left ventricular diastolic dysfunction, NYHA class 2 03/12/2013    ONSET DATE: January 2025   REFERRING DIAG: R13.12 (ICD-10-CM) - Dysphagia, oropharyngeal phase F80.1 (ICD-10-CM) - Expressive language disorder R26.81 (ICD-10-CM) - Unsteadiness on feet  THERAPY DIAG:  Difficulty in walking, not elsewhere classified  Unsteadiness  on feet  Abnormal posture  Muscle weakness (generalized)  Other low back pain  Rationale for Evaluation and Treatment: Rehabilitation  SUBJECTIVE:                                                                                                                                                                                             SUBJECTIVE STATEMENT: Back is ok, have a nagging low grade discomfort in  the bottom of the stomach (reports this is constant)   Pt accompanied by: significant other who assists with subjective hx   PERTINENT HISTORY: HTN, GERD, HLD, HTN, ventricular diastolic dysfunction  PAIN:  Are you having pain? Yes: NPRS scale: I don't hurt bad Pain location: B LEs Pain description: sharp Aggravating factors: walking Relieving factors: Prednisone  PRECAUTIONS: Fall  RED FLAGS: None   WEIGHT BEARING RESTRICTIONS: No  FALLS: Has patient fallen in last 6 months? No  LIVING ENVIRONMENT: Lives with: lives with their spouse Lives in: House/apartment Stairs: 4-5 steps to enter with rail; 2 steps in the back with a handle; 2 story home Has following equipment at home: Vannie - 4 wheeled, Grab bars, and walking poles   PLOF: Independent; pt used to risk his bike  PATIENT GOALS: per wife- get pt more active and improve standing tolerance   OBJECTIVE:   TODAY'S TREATMENT: 12/12/23 Activity Comments  FGA 20/30  Pre-gait/gait training -reciprocal arm swing -gait w/ trekking poles  Pt education Answered questions regarding Pedaling for Parkinson's class/protocol. Provided and reviewed HIIT format for improving cycling tolerance/fitness  Static balance            [facility] PT Assessment - 12/12/23 0001       Functional Gait  Assessment   Gait assessed  Yes    Gait Level Surface Walks 20 ft in less than 7 sec but greater than 5.5 sec, uses assistive device, slower speed, mild gait deviations, or deviates 6-10 in outside of the 12 in walkway width.    Change in Gait Speed Able to smoothly change walking speed without loss of balance or gait deviation. Deviate no more than 6 in outside of the 12 in walkway width.    Gait with Horizontal Head Turns Performs head turns smoothly with slight change in gait velocity (eg, minor disruption to smooth gait path), deviates 6-10 in outside 12 in walkway width, or uses an assistive device.    Gait with Vertical Head Turns Performs  head turns with no change in gait. Deviates no more than 6 in outside 12 in walkway width.    Gait and Pivot Turn Pivot turns safely in greater  than 3 sec and stops with no loss of balance, or pivot turns safely within 3 sec and stops with mild imbalance, requires small steps to catch balance.    Step Over Obstacle Is able to step over 2 stacked shoe boxes taped together (9 in total height) without changing gait speed. No evidence of imbalance.    Gait with Narrow Base of Support Ambulates less than 4 steps heel to toe or cannot perform without assistance.    Gait with Eyes Closed Walks 20 ft, slow speed, abnormal gait pattern, evidence for imbalance, deviates 10-15 in outside 12 in walkway width. Requires more than 9 sec to ambulate 20 ft.    Ambulating Backwards Walks 20 ft, slow speed, abnormal gait pattern, evidence for imbalance, deviates 10-15 in outside 12 in walkway width.    Steps Alternating feet, no rail.    Total Score 20            OTHOSTATICS: Supine: 159/98 with HR 58 Standing x 1 min: 107/75 with HR 72  Standing x 3 min: 103/75 with HR 72     PATIENT EDUCATION: Education details: edu on possible orthostasis and plans to check next session, exam findings, prognosis, POC Person educated: Patient and Spouse Education method: Explanation Education comprehension: verbalized understanding  HOME EXERCISE PROGRAM: Access Code: [email or web address] URL: [email or web address] Date: 12/08/2023 Prepared by: Burnard Sandifer  Exercises - Seated Lumbar Flexion Stretch  - 1 x daily - 7 x weekly - 1 sets - 10 reps - deep breaths hold - Hooklying Single Knee to Chest  - 1 x daily - 7 x weekly - 3 sets - 10 deep breaths hold - Supine Double Knee to Chest  - 1 x daily - 7 x weekly - 3 sets - 10 deep breaths hold Note: Objective measures were completed at Evaluation unless otherwise noted.  DIAGNOSTIC FINDINGS: lumbar xray 07/07/23: Multilevel degenerative disc disease and facet disease.    COGNITION: Overall cognitive status: Within functional limits for tasks assessed   SENSATION: Pt denies N/T in UEs/LEs   COORDINATION: Alternating pronation/supination: B dysdiadochokinesia Alternating toe tap: WFL B Finger to nose: WFL B  MUSCLE TONE: 2 beats of clonus in L ankle  POSTURE: rounded shoulders, forward head, flexed trunk , and slight L trunk lean  LOWER EXTREMITY ROM:     Active  Right Eval Left Eval  Hip flexion    Hip extension    Hip abduction    Hip adduction    Hip internal rotation    Hip external rotation    Knee flexion    Knee extension    Ankle dorsiflexion 10 deg with knee slightly flexed 4 deg with knee slightly flexed  Ankle plantarflexion    Ankle inversion    Ankle eversion     (Blank rows = not tested)   Vitals checked d/t pt reporting mild SOB from assessment thus far: 105/74 mmHg, 65 bpm, 98% spO2   LOWER EXTREMITY MMT:    MMT *in sitting  Right Eval Left Eval  Hip flexion 4+ 4+  Hip extension    Hip abduction 4 4  Hip adduction 4 4  Hip internal rotation    Hip external rotation    Knee flexion 5 5  Knee extension 5 5  Ankle dorsiflexion 5 5  Ankle plantarflexion 5 5  Ankle inversion    Ankle eversion    (Blank rows = not tested)  GAIT: Findings: Assistive device utilized:None, Level of assistance: Modified independence, and  Comments: trunk flexed forward and to L; quick speed but steps are short and discontinuous   FUNCTIONAL TESTS:  5 times sit to stand: 14.74 sec without UE support and cues to stand up fully; 97% spO2                                                                          TREATMENT DATE: 12/07/23      GOALS: Goals reviewed with patient? Yes  SHORT TERM GOALS: Target date: 12/27/2023  Patient to be independent with initial HEP. Baseline: HEP initiated Goal status: INITIAL    LONG TERM GOALS: Target date: 01/17/2024  Patient to be independent with advanced HEP. Baseline: Not yet  initiated  Goal status: INITIAL  Patient to return to walking for exercise for at least 15 minutes at a time without LBP or SOB limiting.  Baseline: Not yet initiated  Goal status: INITIAL  Patient to score at least 22/30 on FGA in order to decrease risk of falls.   Baseline: 20/30 Goal status: INITIAL  Patient to demonstrate STS with 8 out of 10 reps without retropulsion and with upright posture.  Baseline: tendency not to stand fully upright Goal status: INITIAL  Demo improved endurance and gait speed per distance 1,035 ft during Baseline: 765 ft RPE 8/10 Goal status: INITIAL  Patient to report tolerance for 30 minutes of standing without LBP limiting in order to prepare a meal.  Baseline: reports unable Goal status: INITIAL    ASSESSMENT:  CLINICAL IMPRESSION:  Scores 20/30 Functional Gait Assessment indicating increased risk for falls. Gait training to improve large amplitude movements to facilitate stride length and reciprocal arm swing and reinforced with use of trekking poles to good effect maintaining faster speed and alternating coordination. Static balance to improve postural stability with difficulty in tandem stance/crossing of midline. Continued sessions to advance POC details to improve mobility and reduce risk for falls.    OBJECTIVE IMPAIRMENTS: Abnormal gait, cardiopulmonary status limiting activity, decreased activity tolerance, decreased balance, decreased coordination, decreased endurance, decreased knowledge of use of DME, difficulty walking, decreased ROM, decreased strength, dizziness, impaired flexibility, impaired tone, improper body mechanics, postural dysfunction, and pain.   ACTIVITY LIMITATIONS: carrying, lifting, bending, sitting, standing, squatting, stairs, transfers, bathing, toileting, dressing, reach over head, hygiene/grooming, locomotion level, and caring for others  PARTICIPATION LIMITATIONS: meal prep, cleaning, laundry, shopping,  community activity, yard work, and church  PERSONAL FACTORS: Age, Past/current experiences, Time since onset of injury/illness/exacerbation, and 3+ comorbidities: HTN, GERD, HLD, HTN, ventricular diastolic dysfunction are also affecting patient's functional outcome.   REHAB POTENTIAL: Good  CLINICAL DECISION MAKING: Evolving/moderate complexity  EVALUATION COMPLEXITY: Moderate  PLAN:  PT FREQUENCY: 1-2x/week  PT DURATION: 6 weeks  PLANNED INTERVENTIONS: 97164- PT Re-evaluation, 97750- Physical Performance Testing, 97110-Therapeutic exercises, 97530- Therapeutic activity, [id number]- Neuromuscular re-education, 97535- Self Care, [id number]- Manual therapy, [id number]- Gait training, [phone]- Canalith repositioning, [id number]- Aquatic Therapy, [phone] (1-2 muscles), [id number] (3+ muscles)- Dry Needling, Patient/Family education, Balance training, Stair training, Taping, Vestibular training, DME instructions, Cryotherapy, and Moist heat  PLAN FOR NEXT SESSION:   initiate HEP for hip and core strengthening, posture, balance    11:45 AM, 12/12/23 M. Kelly Bobak Oguinn, PT, DPT Physical Therapist- [place] Office  Number: [phone]

## 2023-12-15 ENCOUNTER — Ambulatory Visit: Admitting: Physical Therapy

## 2023-12-15 ENCOUNTER — Encounter: Payer: Self-pay | Admitting: Physical Therapy

## 2023-12-15 DIAGNOSIS — R2681 Unsteadiness on feet: Secondary | ICD-10-CM | POA: Diagnosis not present

## 2023-12-15 DIAGNOSIS — M5459 Other low back pain: Secondary | ICD-10-CM | POA: Diagnosis not present

## 2023-12-15 DIAGNOSIS — R41841 Cognitive communication deficit: Secondary | ICD-10-CM | POA: Diagnosis not present

## 2023-12-15 DIAGNOSIS — R4701 Aphasia: Secondary | ICD-10-CM | POA: Diagnosis not present

## 2023-12-15 DIAGNOSIS — R293 Abnormal posture: Secondary | ICD-10-CM

## 2023-12-15 DIAGNOSIS — R1312 Dysphagia, oropharyngeal phase: Secondary | ICD-10-CM | POA: Diagnosis not present

## 2023-12-15 DIAGNOSIS — R262 Difficulty in walking, not elsewhere classified: Secondary | ICD-10-CM | POA: Diagnosis not present

## 2023-12-15 DIAGNOSIS — M6281 Muscle weakness (generalized): Secondary | ICD-10-CM | POA: Diagnosis not present

## 2023-12-15 DIAGNOSIS — F801 Expressive language disorder: Secondary | ICD-10-CM | POA: Diagnosis not present

## 2023-12-15 DIAGNOSIS — R471 Dysarthria and anarthria: Secondary | ICD-10-CM | POA: Diagnosis not present

## 2023-12-15 NOTE — Therapy (Signed)
 OUTPATIENT PHYSICAL THERAPY NEURO TREATMENT   Patient Name: Drew Andrews MRN: [id number] DOB:1945-11-11, 78 y.o., male Today's Date: 12/15/2023   PCP: Vernadine Charlie ORN, MD  REFERRING PROVIDER: Onita Duos, MD  END OF SESSION:  PT End of Session - 12/15/23 1057     Visit Number 4    Number of Visits 13    Date for PT Re-Evaluation 01/17/24    Authorization Type [employer]    PT Start Time 1102    PT Stop Time 1144    PT Time Calculation (min) 42 min    Activity Tolerance Patient tolerated treatment well    Behavior During Therapy WFL for tasks assessed/performed          Past Medical History:  Diagnosis Date   Ascending aorta dilation (HCC) 09/14/2023   TTE 09/13/2023: EF 60-65, no RWMA, GR 1 DD, normal RVSF, trivial MR, moderate MAC, AVR with normal structure and function, mild dilation of ascending aorta (40 mm)    Bursitis of elbow 05/20/2013   Essential hypertension 03/12/2013   GERD (gastroesophageal reflux disease)    Hyperlipidemia    Hypertension    Left ventricular diastolic dysfunction, NYHA class 2 03/12/2013   LVH (left ventricular hypertrophy)    with aortic stenosis-bicuspid   PONV (postoperative nausea and vomiting)    as a child   S/P AVR (aortic valve replacement) 05/13/2013   S/P AVR, 05/13/13, 25 mm Edwards Magna-Ease pericardial valve 05/13/2013   Seasonal allergies    Severe aortic stenosis 03/12/2013   Past Surgical History:  Procedure Laterality Date   AORTIC VALVE REPLACEMENT N/A 05/13/2013   Procedure: AORTIC VALVE REPLACEMENT (AVR);  Surgeon: Dorise MARLA Fellers, MD;  Location: [facility] OR;  Service: Open Heart Surgery;  Laterality: N/A;   APPENDECTOMY  age 11   CARDIAC CATHETERIZATION     COLONOSCOPY     X 2   HIATAL HERNIA REPAIR     INTRAOPERATIVE TRANSESOPHAGEAL ECHOCARDIOGRAM N/A 05/13/2013   Procedure: INTRAOPERATIVE TRANSESOPHAGEAL ECHOCARDIOGRAM;  Surgeon: Dorise MARLA Fellers, MD;  Location: [facility] OR;  Service: Open Heart Surgery;   Laterality: N/A;   LEFT HEART CATHETERIZATION WITH CORONARY ANGIOGRAM N/A 03/15/2013   Procedure: LEFT HEART CATHETERIZATION WITH CORONARY ANGIOGRAM;  Surgeon: Debby DELENA Sor, MD;  Location: [facility] CATH LAB;  Service: Cardiovascular;  Laterality: N/A;   TONSILLECTOMY     TRANSTHORACIC ECHOCARDIOGRAM  03/04/2013   EF 55-60%, grade 2 diastolic dysfunction, AV with mod calcified leaflets & mild regurg, calcified MV annulus, LA mod dilated, RV mildly dilated   Patient Active Problem List   Diagnosis Date Noted   Ascending aorta dilation (HCC) 09/14/2023   CAD (coronary artery disease) 08/09/2023   Low back pain with sciatica 07/07/2023   Anxiety 04/17/2023   Cognitive impairment 01/12/2023   Parkinson's disease without dyskinesia or fluctuating manifestations (HCC) 01/12/2023   GERD (gastroesophageal reflux disease) 11/15/2015   Diplopia 02/21/2014   S/P AVR (aortic valve replacement) 07/13/2013   Rash of back, contact dermatitis resolved 05/31/2013   Bursitis of elbow 05/20/2013   S/P AVR, 05/13/13, 25 mm Edwards Magna-Ease pericardial valve 05/13/2013   Severe aortic stenosis 03/12/2013   Hyperlipidemia 03/12/2013   Essential hypertension 03/12/2013   Left ventricular diastolic dysfunction, NYHA class 2 03/12/2013    ONSET DATE: January 2025   REFERRING DIAG: R13.12 (ICD-10-CM) - Dysphagia, oropharyngeal phase F80.1 (ICD-10-CM) - Expressive language disorder R26.81 (ICD-10-CM) - Unsteadiness on feet  THERAPY DIAG:  Unsteadiness on feet  Abnormal posture  Muscle  weakness (generalized)  Rationale for Evaluation and Treatment: Rehabilitation  SUBJECTIVE:                                                                                                                                                                                             SUBJECTIVE STATEMENT: Back is okay today.  Have a shot coming up in a few weeks.  For now, the prednisone is helping.  Went for a 10 minute walk  yesterday, got short of breath (not anything new)   Pt accompanied by: significant other who assists with subjective hx   PERTINENT HISTORY: HTN, GERD, HLD, HTN, ventricular diastolic dysfunction  PAIN:  Are you having pain? Yes: NPRS scale: I don't hurt bad Pain location: B LEs Pain description: sharp Aggravating factors: walking Relieving factors: Prednisone  PRECAUTIONS: Fall  RED FLAGS: None   WEIGHT BEARING RESTRICTIONS: No  FALLS: Has patient fallen in last 6 months? No  LIVING ENVIRONMENT: Lives with: lives with their spouse Lives in: House/apartment Stairs: 4-5 steps to enter with rail; 2 steps in the back with a handle; 2 story home Has following equipment at home: Vannie - 4 wheeled, Grab bars, and walking poles   PLOF: Independent; pt used to risk his bike  PATIENT GOALS: per wife- get pt more active and improve standing tolerance   OBJECTIVE:    TODAY'S TREATMENT: 12/15/2023 Activity Comments  NuStep, Level 4/7, 4 extremities x 10 minutes Aerobic intervals per PWR! Moves progression-Easy/moderate intervals Rates effort level as 6/10  Vitals:  O2 97%, HR 74   Seated PWR! Moves: -PWR! Up x 5 reps -PWR! Rock x 5 reps -PWR! Twist x 5 reps -PWR! Step and reach x 5 Cues for slow, sustained stretch  Standing PWR! Moves PWR! UP x 10 PWR! Rock x 10 PWR! Twist x 10 PWR! Step x 10 Holding bilateral walking poles for increased posture, activation through upper body  Forward/back walking x 3 reps   On Airex:  feet apart EO head turns/nods Alternating step taps with UE support   On solid surface-stagger stance forward/back rock For ways pt can stand and lessen back pain, lessen forward lean with prolonged standing activities   HOME EXERCISE PROGRAM: Access Code: [email or web address] URL: [email or web address] Date: 12/15/2023 Prepared by: [facility] Outpatient  Rehab - [facility]  Exercises - Seated Lumbar Flexion Stretch  - 1 x daily - 7 x  weekly - 1 sets - 10 reps - deep breaths hold - Hooklying Single Knee to Chest  - 1 x daily - 7 x weekly - 3 sets - 10 deep breaths hold - Supine  Double Knee to Chest  - 1 x daily - 7 x weekly - 3 sets - 10 deep breaths hold - Staggered Stance Forward Backward Weight Shift with Counter Support  - 1 x daily - 7 x weekly - 1-2 sets - 10 reps           PATIENT EDUCATION: Education details: update to HEP, provided information on PD symposium September 2025.  Speak to MD about his SOB Person educated: Patient and Spouse Education method: Explanation Education comprehension: verbalized understanding  OTHOSTATICS: Supine: 159/98 with HR 58 Standing x 1 min: 107/75 with HR 72  Standing x 3 min: 103/75 with HR 72 --------------------------------------------------------- Note: Objective measures were completed at Evaluation unless otherwise noted.  DIAGNOSTIC FINDINGS: lumbar xray 07/07/23: Multilevel degenerative disc disease and facet disease.   COGNITION: Overall cognitive status: Within functional limits for tasks assessed   SENSATION: Pt denies N/T in UEs/LEs   COORDINATION: Alternating pronation/supination: B dysdiadochokinesia Alternating toe tap: WFL B Finger to nose: WFL B  MUSCLE TONE: 2 beats of clonus in L ankle  POSTURE: rounded shoulders, forward head, flexed trunk , and slight L trunk lean  LOWER EXTREMITY ROM:     Active  Right Eval Left Eval  Hip flexion    Hip extension    Hip abduction    Hip adduction    Hip internal rotation    Hip external rotation    Knee flexion    Knee extension    Ankle dorsiflexion 10 deg with knee slightly flexed 4 deg with knee slightly flexed  Ankle plantarflexion    Ankle inversion    Ankle eversion     (Blank rows = not tested)   Vitals checked d/t pt reporting mild SOB from assessment thus far: 105/74 mmHg, 65 bpm, 98% spO2   LOWER EXTREMITY MMT:    MMT *in sitting  Right Eval Left Eval  Hip flexion 4+ 4+  Hip  extension    Hip abduction 4 4  Hip adduction 4 4  Hip internal rotation    Hip external rotation    Knee flexion 5 5  Knee extension 5 5  Ankle dorsiflexion 5 5  Ankle plantarflexion 5 5  Ankle inversion    Ankle eversion    (Blank rows = not tested)  GAIT: Findings: Assistive device utilized:None, Level of assistance: Modified independence, and Comments: trunk flexed forward and to L; quick speed but steps are short and discontinuous   FUNCTIONAL TESTS:  5 times sit to stand: 14.74 sec without UE support and cues to stand up fully; 97% spO2                                                                          TREATMENT DATE: 12/07/23      GOALS: Goals reviewed with patient? Yes  SHORT TERM GOALS: Target date: 12/27/2023  Patient to be independent with initial HEP. Baseline: HEP initiated Goal status: INITIAL    LONG TERM GOALS: Target date: 01/17/2024  Patient to be independent with advanced HEP. Baseline: Not yet initiated  Goal status: INITIAL  Patient to return to walking for exercise for at least 15 minutes at a time without LBP or SOB limiting.  Baseline: Not yet initiated  Goal status: INITIAL  Patient to score at least 22/30 on FGA in order to decrease risk of falls.   Baseline: 20/30 Goal status: INITIAL  Patient to demonstrate STS with 8 out of 10 reps without retropulsion and with upright posture.  Baseline: tendency not to stand fully upright Goal status: INITIAL  Demo improved endurance and gait speed per distance 1,035 ft during Baseline: 765 ft RPE 8/10 Goal status: INITIAL  Patient to report tolerance for 30 minutes of standing without LBP limiting in order to prepare a meal.  Baseline: reports unable Goal status: INITIAL    ASSESSMENT:  CLINICAL IMPRESSION: Pt presents today with no new complaints; he does report some shortness of breath noted after his 10 minute walk yesterday, stating this isn't new, but it limits his activity  level.  Monitored O2 sats in session today and they are WNL, educated pt to speak to MD about his SOB.  Skilled PT session focused on aerobic warm up + seated and standing PWR! Moves + balance activities.  He needs some cues for full activation of Ues and has difficulty with coordination of arm swing with forward/back walking.  He will continue to benefit from skilled PT towards goals for improved functional mobility and decreased fall risk.   OBJECTIVE IMPAIRMENTS: Abnormal gait, cardiopulmonary status limiting activity, decreased activity tolerance, decreased balance, decreased coordination, decreased endurance, decreased knowledge of use of DME, difficulty walking, decreased ROM, decreased strength, dizziness, impaired flexibility, impaired tone, improper body mechanics, postural dysfunction, and pain.   ACTIVITY LIMITATIONS: carrying, lifting, bending, sitting, standing, squatting, stairs, transfers, bathing, toileting, dressing, reach over head, hygiene/grooming, locomotion level, and caring for others  PARTICIPATION LIMITATIONS: meal prep, cleaning, laundry, shopping, community activity, yard work, and church  PERSONAL FACTORS: Age, Past/current experiences, Time since onset of injury/illness/exacerbation, and 3+ comorbidities: HTN, GERD, HLD, HTN, ventricular diastolic dysfunction are also affecting patient's functional outcome.   REHAB POTENTIAL: Good  CLINICAL DECISION MAKING: Evolving/moderate complexity  EVALUATION COMPLEXITY: Moderate  PLAN:  PT FREQUENCY: 1-2x/week  PT DURATION: 6 weeks  PLANNED INTERVENTIONS: 97164- PT Re-evaluation, 97750- Physical Performance Testing, 97110-Therapeutic exercises, 97530- Therapeutic activity, 97112- Neuromuscular re-education, 97535- Self Care, [id number]- Manual therapy, [phone]- Gait training, [phone]- Canalith repositioning, [id number]- Aquatic Therapy, [phone] (1-2 muscles), [id number] (3+ muscles)- Dry Needling, Patient/Family education, Balance training, Stair  training, Taping, Vestibular training, DME instructions, Cryotherapy, and Moist heat  PLAN FOR NEXT SESSION:   Review current HEP and progress for hip and core strengthening, posture, balance    Greig Anon, PT 12/15/23 11:56 AM Phone: [phone] Fax: [phone]  [facility] [address] [place], [place] [address] Phone # [phone] Fax # [phone]

## 2023-12-18 ENCOUNTER — Ambulatory Visit (HOSPITAL_COMMUNITY)
Admission: RE | Admit: 2023-12-18 | Discharge: 2023-12-18 | Disposition: A | Discharge status: Home / Self Care | Source: Ambulatory Visit | Attending: Internal Medicine | Admitting: Internal Medicine

## 2023-12-18 DIAGNOSIS — R059 Cough, unspecified: Secondary | ICD-10-CM | POA: Diagnosis not present

## 2023-12-18 DIAGNOSIS — R2681 Unsteadiness on feet: Secondary | ICD-10-CM

## 2023-12-18 DIAGNOSIS — R1310 Dysphagia, unspecified: Secondary | ICD-10-CM

## 2023-12-18 DIAGNOSIS — R4701 Aphasia: Secondary | ICD-10-CM

## 2023-12-18 DIAGNOSIS — R1312 Dysphagia, oropharyngeal phase: Secondary | ICD-10-CM | POA: Insufficient documentation

## 2023-12-18 DIAGNOSIS — G20A1 Parkinson's disease without dyskinesia, without mention of fluctuations: Secondary | ICD-10-CM | POA: Diagnosis not present

## 2023-12-18 DIAGNOSIS — F801 Expressive language disorder: Secondary | ICD-10-CM

## 2023-12-18 NOTE — Therapy (Signed)
 Modified Barium Swallow Study  Patient Details  Name: Marcques Wrightsman MRN: [id number] Date of Birth: [birth year]  Today's Date: 12/18/2023  Modified Barium Swallow completed.  Full report located under Chart Review in the Imaging Section.  History of Present Illness Hoby Kawai is a 78 y.o. male with PMH: GERD, HTN, HLD, hiatal hernia repair, anxiety, cognitive impairment. He presented to this OP MBS on recommendation from OP SLP due to his spouse reporting coughing mid-meal or after a meal. He himself told SLP prior to administering barium contrast, that his only complaint is coughing up clear loogies/phlegm after eating.   Clinical Impression Brennden Masten presents with an oropharyngeal swallow that is largely [facility] and without aspiration of any of the liquid or solid barium consistencies tested. He had one instance of flash penetration (PAS 2) above the vocal cords when swallowing thin liquid barium with 13mm barium tablet. Only mild amount of pyriform residuals and trace amount of lingual and vallecular residuals remained s/p initial swallows. Retrograde flow of barium below and through PES was observed with liquid barium residuals in pyriform sinuses. 13mm barium tablet became briefly lodged at thoracic level of esophagus but fully transited with sips of thin liquid barium. No stasis or retrograde movement of barium observed in distal portion of esophagus. SLP spoke with Mr. Tarman and his spouse in lobby after MBS completed and neither had any c/o dysphagia. Factors that may increase risk of adverse event in presence of aspiration [name] 2021): Reduced cognitive function  Swallow Evaluation Recommendations Recommendations: PO diet PO Diet Recommendation: Regular;Thin liquids (Level 0) Liquid Administration via: Cup;Straw Medication Administration: Whole meds with liquid Supervision: Patient able to self-feed Swallowing strategies  : Slow rate;Small  bites/sips Postural changes: Position pt fully upright for meals Oral care recommendations: Oral care BID (2x/day)     Norleen IVAR Blase, MA, CCC-SLP Speech Therapy

## 2023-12-19 ENCOUNTER — Encounter: Payer: Self-pay | Admitting: Physical Therapy

## 2023-12-19 ENCOUNTER — Ambulatory Visit: Admitting: Physical Therapy

## 2023-12-19 ENCOUNTER — Ambulatory Visit: Payer: Self-pay | Admitting: Neurology

## 2023-12-19 DIAGNOSIS — R41841 Cognitive communication deficit: Secondary | ICD-10-CM | POA: Diagnosis not present

## 2023-12-19 DIAGNOSIS — R2681 Unsteadiness on feet: Secondary | ICD-10-CM

## 2023-12-19 DIAGNOSIS — M5459 Other low back pain: Secondary | ICD-10-CM

## 2023-12-19 DIAGNOSIS — M6281 Muscle weakness (generalized): Secondary | ICD-10-CM

## 2023-12-19 DIAGNOSIS — F801 Expressive language disorder: Secondary | ICD-10-CM | POA: Diagnosis not present

## 2023-12-19 DIAGNOSIS — R293 Abnormal posture: Secondary | ICD-10-CM

## 2023-12-19 DIAGNOSIS — R4701 Aphasia: Secondary | ICD-10-CM | POA: Diagnosis not present

## 2023-12-19 DIAGNOSIS — R1312 Dysphagia, oropharyngeal phase: Secondary | ICD-10-CM | POA: Diagnosis not present

## 2023-12-19 DIAGNOSIS — R471 Dysarthria and anarthria: Secondary | ICD-10-CM | POA: Diagnosis not present

## 2023-12-19 DIAGNOSIS — R262 Difficulty in walking, not elsewhere classified: Secondary | ICD-10-CM | POA: Diagnosis not present

## 2023-12-19 NOTE — Therapy (Signed)
 OUTPATIENT PHYSICAL THERAPY NEURO TREATMENT   Patient Name: Drew Andrews MRN: [id number] DOB:07/05/1945, 78 y.o., male Today's Date: 12/19/2023   PCP: Vernadine Charlie ORN, MD  REFERRING PROVIDER: Onita Duos, MD  END OF SESSION:  PT End of Session - 12/19/23 0933     Visit Number 5    Number of Visits 13    Date for PT Re-Evaluation 01/17/24    Authorization Type [employer]    PT Start Time [phone]    PT Stop Time 1013    PT Time Calculation (min) 39 min    Activity Tolerance Patient tolerated treatment well    Behavior During Therapy [facility] for tasks assessed/performed           Past Medical History:  Diagnosis Date   Ascending aorta dilation (HCC) 09/14/2023   TTE 09/13/2023: EF 60-65, no RWMA, GR 1 DD, normal RVSF, trivial MR, moderate MAC, AVR with normal structure and function, mild dilation of ascending aorta (40 mm)    Bursitis of elbow 05/20/2013   Essential hypertension 03/12/2013   GERD (gastroesophageal reflux disease)    Hyperlipidemia    Hypertension    Left ventricular diastolic dysfunction, NYHA class 2 03/12/2013   LVH (left ventricular hypertrophy)    with aortic stenosis-bicuspid   PONV (postoperative nausea and vomiting)    as a child   S/P AVR (aortic valve replacement) 05/13/2013   S/P AVR, 05/13/13, 25 mm Edwards Magna-Ease pericardial valve 05/13/2013   Seasonal allergies    Severe aortic stenosis 03/12/2013   Past Surgical History:  Procedure Laterality Date   AORTIC VALVE REPLACEMENT N/A 05/13/2013   Procedure: AORTIC VALVE REPLACEMENT (AVR);  Surgeon: Dorise MARLA Fellers, MD;  Location: [facility] OR;  Service: Open Heart Surgery;  Laterality: N/A;   APPENDECTOMY  age 40   CARDIAC CATHETERIZATION     COLONOSCOPY     X 2   HIATAL HERNIA REPAIR     INTRAOPERATIVE TRANSESOPHAGEAL ECHOCARDIOGRAM N/A 05/13/2013   Procedure: INTRAOPERATIVE TRANSESOPHAGEAL ECHOCARDIOGRAM;  Surgeon: Dorise MARLA Fellers, MD;  Location: [facility] OR;  Service: Open Heart Surgery;   Laterality: N/A;   LEFT HEART CATHETERIZATION WITH CORONARY ANGIOGRAM N/A 03/15/2013   Procedure: LEFT HEART CATHETERIZATION WITH CORONARY ANGIOGRAM;  Surgeon: Debby DELENA Sor, MD;  Location: [facility] CATH LAB;  Service: Cardiovascular;  Laterality: N/A;   TONSILLECTOMY     TRANSTHORACIC ECHOCARDIOGRAM  03/04/2013   EF 55-60%, grade 2 diastolic dysfunction, AV with mod calcified leaflets & mild regurg, calcified MV annulus, LA mod dilated, RV mildly dilated   Patient Active Problem List   Diagnosis Date Noted   Ascending aorta dilation (HCC) 09/14/2023   CAD (coronary artery disease) 08/09/2023   Low back pain with sciatica 07/07/2023   Anxiety 04/17/2023   Cognitive impairment 01/12/2023   Parkinson's disease without dyskinesia or fluctuating manifestations (HCC) 01/12/2023   GERD (gastroesophageal reflux disease) 11/15/2015   Diplopia 02/21/2014   S/P AVR (aortic valve replacement) 07/13/2013   Rash of back, contact dermatitis resolved 05/31/2013   Bursitis of elbow 05/20/2013   S/P AVR, 05/13/13, 25 mm Edwards Magna-Ease pericardial valve 05/13/2013   Severe aortic stenosis 03/12/2013   Hyperlipidemia 03/12/2013   Essential hypertension 03/12/2013   Left ventricular diastolic dysfunction, NYHA class 2 03/12/2013    ONSET DATE: January 2025   REFERRING DIAG: R13.12 (ICD-10-CM) - Dysphagia, oropharyngeal phase F80.1 (ICD-10-CM) - Expressive language disorder R26.81 (ICD-10-CM) - Unsteadiness on feet  THERAPY DIAG:  Unsteadiness on feet  Muscle weakness (generalized)  Other low back pain  Abnormal posture  Rationale for Evaluation and Treatment: Rehabilitation  SUBJECTIVE:                                                                                                                                                                                             SUBJECTIVE STATEMENT: The main thing is my stomach that bothers me all the time.  Going to get a spine injection tomorrow.   Did the standing position and was able to stand longer without pain.  Pt accompanied by: significant other who assists with subjective hx   PERTINENT HISTORY: HTN, GERD, HLD, HTN, ventricular diastolic dysfunction  PAIN:  Are you having pain? Yes: NPRS scale: I don't hurt bad Pain location: B LEs Pain description: sharp Aggravating factors: walking Relieving factors: Prednisone *Pt rates 1-2/10 pain in lower abdomen-long standing PRECAUTIONS: Fall  RED FLAGS: None   WEIGHT BEARING RESTRICTIONS: No  FALLS: Has patient fallen in last 6 months? No  LIVING ENVIRONMENT: Lives with: lives with their spouse Lives in: House/apartment Stairs: 4-5 steps to enter with rail; 2 steps in the back with a handle; 2 story home Has following equipment at home: Vannie - 4 wheeled, Grab bars, and walking poles   PLOF: Independent; pt used to risk his bike  PATIENT GOALS: per wife- get pt more active and improve standing tolerance   OBJECTIVE:    TODAY'S TREATMENT: 12/19/2023 Activity Comments  NuStep, Level 5/7, 4 extremities x 10 minutes Aerobic intervals per PWR! Moves progression-Easy/moderate intervals Rates effort level as 6-7/10  Vitals: O2 sats 96%, HR 70bpm   Review of stagger stance rocking   Forward/back walking x 2 min   Alternate tapping to 6 step, 3 x 10 reps BUE>1 UE >no UE support  Tandem gait forward/back x 2 min In parallel bars-1 episode of LOB  Standing on Airex:  Forward step/weightshift x 10 Back step/weightshift x 10 Side step/weightshift x 10 Forward/back weightshift x 10  1-2 LOB, BUE support and able to recover  Decreased foot clearance  Attempted treadmill x 2 min at 0.8-1 mph, BUE support Pt has increased forward lean, causing him to progress posteriorly on treadmill belt; difficulty with forward hip kicks to generate increased step length    HOME EXERCISE PROGRAM: Access Code: [email or web address] URL: [email or web address] Date:  12/15/2023 Prepared by: [facility] - Outpatient  Rehab - [facility]  Exercises - Seated Lumbar Flexion Stretch  - 1 x daily - 7 x weekly - 1 sets - 10 reps - deep breaths hold - Hooklying Single Knee to Chest  - 1 x daily - 7  x weekly - 3 sets - 10 deep breaths hold - Supine Double Knee to Chest  - 1 x daily - 7 x weekly - 3 sets - 10 deep breaths hold - Staggered Stance Forward Backward Weight Shift with Counter Support  - 1 x daily - 7 x weekly - 1-2 sets - 10 reps           PATIENT EDUCATION: Education details: Continue current HEP Person educated: Patient and Spouse Education method: Explanation Education comprehension: verbalized understanding  OTHOSTATICS: Supine: 159/98 with HR 58 Standing x 1 min: 107/75 with HR 72  Standing x 3 min: 103/75 with HR 72 --------------------------------------------------------- Note: Objective measures were completed at Evaluation unless otherwise noted.  DIAGNOSTIC FINDINGS: lumbar xray 07/07/23: Multilevel degenerative disc disease and facet disease.   COGNITION: Overall cognitive status: Within functional limits for tasks assessed   SENSATION: Pt denies N/T in UEs/LEs   COORDINATION: Alternating pronation/supination: B dysdiadochokinesia Alternating toe tap: WFL B Finger to nose: WFL B  MUSCLE TONE: 2 beats of clonus in L ankle  POSTURE: rounded shoulders, forward head, flexed trunk , and slight L trunk lean  LOWER EXTREMITY ROM:     Active  Right Eval Left Eval  Hip flexion    Hip extension    Hip abduction    Hip adduction    Hip internal rotation    Hip external rotation    Knee flexion    Knee extension    Ankle dorsiflexion 10 deg with knee slightly flexed 4 deg with knee slightly flexed  Ankle plantarflexion    Ankle inversion    Ankle eversion     (Blank rows = not tested)   Vitals checked d/t pt reporting mild SOB from assessment thus far: 105/74 mmHg, 65 bpm, 98% spO2   LOWER EXTREMITY MMT:    MMT  *in sitting  Right Eval Left Eval  Hip flexion 4+ 4+  Hip extension    Hip abduction 4 4  Hip adduction 4 4  Hip internal rotation    Hip external rotation    Knee flexion 5 5  Knee extension 5 5  Ankle dorsiflexion 5 5  Ankle plantarflexion 5 5  Ankle inversion    Ankle eversion    (Blank rows = not tested)  GAIT: Findings: Assistive device utilized:None, Level of assistance: Modified independence, and Comments: trunk flexed forward and to L; quick speed but steps are short and discontinuous   FUNCTIONAL TESTS:  5 times sit to stand: 14.74 sec without UE support and cues to stand up fully; 97% spO2                                                                          TREATMENT DATE: 12/07/23      GOALS: Goals reviewed with patient? Yes  SHORT TERM GOALS: Target date: 12/27/2023  Patient to be independent with initial HEP. Baseline: HEP initiated Goal status: INITIAL    LONG TERM GOALS: Target date: 01/17/2024  Patient to be independent with advanced HEP. Baseline: Not yet initiated  Goal status: INITIAL  Patient to return to walking for exercise for at least 15 minutes at a time without LBP or SOB limiting.  Baseline: Not  yet initiated  Goal status: INITIAL  Patient to score at least 22/30 on FGA in order to decrease risk of falls.   Baseline: 20/30 Goal status: INITIAL  Patient to demonstrate STS with 8 out of 10 reps without retropulsion and with upright posture.  Baseline: tendency not to stand fully upright Goal status: INITIAL  Demo improved endurance and gait speed per distance 1,035 ft during Baseline: 765 ft RPE 8/10 Goal status: INITIAL  Patient to report tolerance for 30 minutes of standing without LBP limiting in order to prepare a meal.  Baseline: reports unable Goal status: INITIAL    ASSESSMENT:  CLINICAL IMPRESSION: Pt presents today with no new complaints. Skilled PT session focused on aerobic warm up + balance and dynamic  gait activities.  With tandem gait and with stepping activities on Airex, pt has several LOB, where he is very unsteady.  He is able to regain balance with UE support and therapist supervision.  Trialed treadmill, but pt has difficulty generating forward increased step length and has heavy UE support, causing him to progress posteriorly on treadmill belt.  He will continue to benefit from skilled PT towards goals for improved functional mobility and decreased fall risk.   OBJECTIVE IMPAIRMENTS: Abnormal gait, cardiopulmonary status limiting activity, decreased activity tolerance, decreased balance, decreased coordination, decreased endurance, decreased knowledge of use of DME, difficulty walking, decreased ROM, decreased strength, dizziness, impaired flexibility, impaired tone, improper body mechanics, postural dysfunction, and pain.   ACTIVITY LIMITATIONS: carrying, lifting, bending, sitting, standing, squatting, stairs, transfers, bathing, toileting, dressing, reach over head, hygiene/grooming, locomotion level, and caring for others  PARTICIPATION LIMITATIONS: meal prep, cleaning, laundry, shopping, community activity, yard work, and church  PERSONAL FACTORS: Age, Past/current experiences, Time since onset of injury/illness/exacerbation, and 3+ comorbidities: HTN, GERD, HLD, HTN, ventricular diastolic dysfunction are also affecting patient's functional outcome.   REHAB POTENTIAL: Good  CLINICAL DECISION MAKING: Evolving/moderate complexity  EVALUATION COMPLEXITY: Moderate  PLAN:  PT FREQUENCY: 1-2x/week  PT DURATION: 6 weeks  PLANNED INTERVENTIONS: 97164- PT Re-evaluation, 97750- Physical Performance Testing, 97110-Therapeutic exercises, 97530- Therapeutic activity, 97112- Neuromuscular re-education, 97535- Self Care, [id number]- Manual therapy, [phone]- Gait training, [phone]- Canalith repositioning, [id number]- Aquatic Therapy, [phone] (1-2 muscles), [id number] (3+ muscles)- Dry Needling, Patient/Family  education, Balance training, Stair training, Taping, Vestibular training, DME instructions, Cryotherapy, and Moist heat  PLAN FOR NEXT SESSION:   Try resisted gait activities at [facility]; Progress for hip and core strengthening, posture, balance    Greig Anon, PT 12/19/23 10:33 AM Phone: [phone] Fax: [phone]  [facility] at [facility] [address] [place], [place] [address] Phone # [phone] Fax # [phone]

## 2023-12-20 DIAGNOSIS — M48062 Spinal stenosis, lumbar region with neurogenic claudication: Secondary | ICD-10-CM | POA: Diagnosis not present

## 2023-12-20 DIAGNOSIS — M539 Dorsopathy, unspecified: Secondary | ICD-10-CM | POA: Diagnosis not present

## 2023-12-22 ENCOUNTER — Ambulatory Visit

## 2023-12-22 DIAGNOSIS — R262 Difficulty in walking, not elsewhere classified: Secondary | ICD-10-CM | POA: Diagnosis not present

## 2023-12-22 DIAGNOSIS — M5459 Other low back pain: Secondary | ICD-10-CM | POA: Diagnosis not present

## 2023-12-22 DIAGNOSIS — R4701 Aphasia: Secondary | ICD-10-CM | POA: Diagnosis not present

## 2023-12-22 DIAGNOSIS — F801 Expressive language disorder: Secondary | ICD-10-CM | POA: Diagnosis not present

## 2023-12-22 DIAGNOSIS — R2681 Unsteadiness on feet: Secondary | ICD-10-CM

## 2023-12-22 DIAGNOSIS — R293 Abnormal posture: Secondary | ICD-10-CM

## 2023-12-22 DIAGNOSIS — R471 Dysarthria and anarthria: Secondary | ICD-10-CM | POA: Diagnosis not present

## 2023-12-22 DIAGNOSIS — M6281 Muscle weakness (generalized): Secondary | ICD-10-CM

## 2023-12-22 DIAGNOSIS — R1312 Dysphagia, oropharyngeal phase: Secondary | ICD-10-CM | POA: Diagnosis not present

## 2023-12-22 DIAGNOSIS — R41841 Cognitive communication deficit: Secondary | ICD-10-CM | POA: Diagnosis not present

## 2023-12-22 NOTE — Therapy (Signed)
 OUTPATIENT PHYSICAL THERAPY NEURO TREATMENT   Patient Name: Drew Andrews MRN: [id number] DOB:Apr 27, 1946, 78 y.o., male Today's Date: 12/22/2023   PCP: Vernadine Charlie ORN, MD  REFERRING PROVIDER: Onita Duos, MD  END OF SESSION:  PT End of Session - 12/22/23 1015     Visit Number 6    Number of Visits 13    Date for PT Re-Evaluation 01/17/24    Authorization Type [employer]    PT Start Time 1015    PT Stop Time 1100    PT Time Calculation (min) 45 min    Activity Tolerance Patient tolerated treatment well    Behavior During Therapy [facility] for tasks assessed/performed           Past Medical History:  Diagnosis Date   Ascending aorta dilation (HCC) 09/14/2023   TTE 09/13/2023: EF 60-65, no RWMA, GR 1 DD, normal RVSF, trivial MR, moderate MAC, AVR with normal structure and function, mild dilation of ascending aorta (40 mm)    Bursitis of elbow 05/20/2013   Essential hypertension 03/12/2013   GERD (gastroesophageal reflux disease)    Hyperlipidemia    Hypertension    Left ventricular diastolic dysfunction, NYHA class 2 03/12/2013   LVH (left ventricular hypertrophy)    with aortic stenosis-bicuspid   PONV (postoperative nausea and vomiting)    as a child   S/P AVR (aortic valve replacement) 05/13/2013   S/P AVR, 05/13/13, 25 mm Edwards Magna-Ease pericardial valve 05/13/2013   Seasonal allergies    Severe aortic stenosis 03/12/2013   Past Surgical History:  Procedure Laterality Date   AORTIC VALVE REPLACEMENT N/A 05/13/2013   Procedure: AORTIC VALVE REPLACEMENT (AVR);  Surgeon: Dorise MARLA Fellers, MD;  Location: [facility] OR;  Service: Open Heart Surgery;  Laterality: N/A;   APPENDECTOMY  age 19   CARDIAC CATHETERIZATION     COLONOSCOPY     X 2   HIATAL HERNIA REPAIR     INTRAOPERATIVE TRANSESOPHAGEAL ECHOCARDIOGRAM N/A 05/13/2013   Procedure: INTRAOPERATIVE TRANSESOPHAGEAL ECHOCARDIOGRAM;  Surgeon: Dorise MARLA Fellers, MD;  Location: [facility] OR;  Service: Open Heart Surgery;   Laterality: N/A;   LEFT HEART CATHETERIZATION WITH CORONARY ANGIOGRAM N/A 03/15/2013   Procedure: LEFT HEART CATHETERIZATION WITH CORONARY ANGIOGRAM;  Surgeon: Debby DELENA Sor, MD;  Location: [facility] CATH LAB;  Service: Cardiovascular;  Laterality: N/A;   TONSILLECTOMY     TRANSTHORACIC ECHOCARDIOGRAM  03/04/2013   EF 55-60%, grade 2 diastolic dysfunction, AV with mod calcified leaflets & mild regurg, calcified MV annulus, LA mod dilated, RV mildly dilated   Patient Active Problem List   Diagnosis Date Noted   Ascending aorta dilation (HCC) 09/14/2023   CAD (coronary artery disease) 08/09/2023   Low back pain with sciatica 07/07/2023   Anxiety 04/17/2023   Cognitive impairment 01/12/2023   Parkinson's disease without dyskinesia or fluctuating manifestations (HCC) 01/12/2023   GERD (gastroesophageal reflux disease) 11/15/2015   Diplopia 02/21/2014   S/P AVR (aortic valve replacement) 07/13/2013   Rash of back, contact dermatitis resolved 05/31/2013   Bursitis of elbow 05/20/2013   S/P AVR, 05/13/13, 25 mm Edwards Magna-Ease pericardial valve 05/13/2013   Severe aortic stenosis 03/12/2013   Hyperlipidemia 03/12/2013   Essential hypertension 03/12/2013   Left ventricular diastolic dysfunction, NYHA class 2 03/12/2013    ONSET DATE: January 2025   REFERRING DIAG: R13.12 (ICD-10-CM) - Dysphagia, oropharyngeal phase F80.1 (ICD-10-CM) - Expressive language disorder R26.81 (ICD-10-CM) - Unsteadiness on feet  THERAPY DIAG:  Unsteadiness on feet  Muscle weakness (generalized)  Abnormal posture  Difficulty in walking, not elsewhere classified  Rationale for Evaluation and Treatment: Rehabilitation  SUBJECTIVE:                                                                                                                                                                                             SUBJECTIVE STATEMENT: Will end of having a back surgery some time in the near future  Pt  accompanied by: self   PERTINENT HISTORY: HTN, GERD, HLD, HTN, ventricular diastolic dysfunction  PAIN:  Are you having pain? Yes: NPRS scale: I don't hurt bad Pain location: B LEs Pain description: sharp Aggravating factors: walking Relieving factors: Prednisone *Pt rates 1-2/10 pain in lower abdomen-long standing PRECAUTIONS: Fall  RED FLAGS: None   WEIGHT BEARING RESTRICTIONS: No  FALLS: Has patient fallen in last 6 months? No  LIVING ENVIRONMENT: Lives with: lives with their spouse Lives in: House/apartment Stairs: 4-5 steps to enter with rail; 2 steps in the back with a handle; 2 story home Has following equipment at home: Vannie - 4 wheeled, Grab bars, and walking poles   PLOF: Independent; pt used to risk his bike  PATIENT GOALS: per wife- get pt more active and improve standing tolerance   OBJECTIVE:   TODAY'S TREATMENT: 12/22/23 Activity Comments  NU-step speed intervals x 8 min. L5 resistance 2 min warm-up 30 sec sprint; 60 sec recovery. Cues for transition. Good maintenance of 100+ SPM in intervals. Initial reading 82% O2, 90 bpm, had him perform hand pumps and increased to 97%  Gait training -resisted gait x 2 min--cue for big arm swing -resisted walk/jog x 2 min cue for faster arm swing. O2 94%, 94 bpm -resisted retrowalk x 2 min--with finger flick  Static balance on foam with dual tasking   Stretching/flexibility Seated forward flexion Seated piriformis Partial long sit hamstring--decreased flexibility RLE            TODAY'S TREATMENT: 12/19/2023 Activity Comments  NuStep, Level 5/7, 4 extremities x 10 minutes Aerobic intervals per PWR! Moves progression-Easy/moderate intervals Rates effort level as 6-7/10  Vitals: O2 sats 96%, HR 70bpm   Review of stagger stance rocking   Forward/back walking x 2 min   Alternate tapping to 6 step, 3 x 10 reps BUE>1 UE >no UE support  Tandem gait forward/back x 2 min In parallel bars-1 episode of LOB  Standing  on Airex:  Forward step/weightshift x 10 Back step/weightshift x 10 Side step/weightshift x 10 Forward/back weightshift x 10  1-2 LOB, BUE support and able to recover  Decreased foot clearance  Attempted treadmill x 2 min at 0.8-1  mph, BUE support Pt has increased forward lean, causing him to progress posteriorly on treadmill belt; difficulty with forward hip kicks to generate increased step length    HOME EXERCISE PROGRAM: Access Code: [email or web address] URL: [email or web address] Date: 12/15/2023 Prepared by: [facility] - Outpatient  Rehab - [facility]  Exercises - Seated Lumbar Flexion Stretch  - 1 x daily - 7 x weekly - 1 sets - 10 reps - deep breaths hold - Hooklying Single Knee to Chest  - 1 x daily - 7 x weekly - 3 sets - 10 deep breaths hold - Supine Double Knee to Chest  - 1 x daily - 7 x weekly - 3 sets - 10 deep breaths hold - Staggered Stance Forward Backward Weight Shift with Counter Support  - 1 x daily - 7 x weekly - 1-2 sets - 10 reps           PATIENT EDUCATION: Education details: Continue current HEP Person educated: Patient and Spouse Education method: Explanation Education comprehension: verbalized understanding  OTHOSTATICS: Supine: 159/98 with HR 58 Standing x 1 min: 107/75 with HR 72  Standing x 3 min: 103/75 with HR 72 --------------------------------------------------------- Note: Objective measures were completed at Evaluation unless otherwise noted.  DIAGNOSTIC FINDINGS: lumbar xray 07/07/23: Multilevel degenerative disc disease and facet disease.   COGNITION: Overall cognitive status: Within functional limits for tasks assessed   SENSATION: Pt denies N/T in UEs/LEs   COORDINATION: Alternating pronation/supination: B dysdiadochokinesia Alternating toe tap: WFL B Finger to nose: WFL B  MUSCLE TONE: 2 beats of clonus in L ankle  POSTURE: rounded shoulders, forward head, flexed trunk , and slight L trunk lean  LOWER EXTREMITY  ROM:     Active  Right Eval Left Eval  Hip flexion    Hip extension    Hip abduction    Hip adduction    Hip internal rotation    Hip external rotation    Knee flexion    Knee extension    Ankle dorsiflexion 10 deg with knee slightly flexed 4 deg with knee slightly flexed  Ankle plantarflexion    Ankle inversion    Ankle eversion     (Blank rows = not tested)   Vitals checked d/t pt reporting mild SOB from assessment thus far: 105/74 mmHg, 65 bpm, 98% spO2   LOWER EXTREMITY MMT:    MMT *in sitting  Right Eval Left Eval  Hip flexion 4+ 4+  Hip extension    Hip abduction 4 4  Hip adduction 4 4  Hip internal rotation    Hip external rotation    Knee flexion 5 5  Knee extension 5 5  Ankle dorsiflexion 5 5  Ankle plantarflexion 5 5  Ankle inversion    Ankle eversion    (Blank rows = not tested)  GAIT: Findings: Assistive device utilized:None, Level of assistance: Modified independence, and Comments: trunk flexed forward and to L; quick speed but steps are short and discontinuous   FUNCTIONAL TESTS:  5 times sit to stand: 14.74 sec without UE support and cues to stand up fully; 97% spO2  TREATMENT DATE: 12/07/23      GOALS: Goals reviewed with patient? Yes  SHORT TERM GOALS: Target date: 12/27/2023  Patient to be independent with initial HEP. Baseline: HEP initiated Goal status: INITIAL    LONG TERM GOALS: Target date: 01/17/2024  Patient to be independent with advanced HEP. Baseline: Not yet initiated  Goal status: INITIAL  Patient to return to walking for exercise for at least 15 minutes at a time without LBP or SOB limiting.  Baseline: Not yet initiated  Goal status: INITIAL  Patient to score at least 22/30 on FGA in order to decrease risk of falls.   Baseline: 20/30 Goal status: INITIAL  Patient to demonstrate STS with 8 out of 10 reps without retropulsion and with upright  posture.  Baseline: tendency not to stand fully upright Goal status: INITIAL  Demo improved endurance and gait speed per distance 1,035 ft during Baseline: 765 ft RPE 8/10 Goal status: INITIAL  Patient to report tolerance for 30 minutes of standing without LBP limiting in order to prepare a meal.  Baseline: reports unable Goal status: INITIAL    ASSESSMENT:  CLINICAL IMPRESSION: Good tolerance to NU-step HIIT for rapid alternating movement coordination and god ability to sustain high rate of speed. Gait training techniques to improve speed of ambulation and facilitation of reciprocal arm swing. Retrowalk to improve postural control and facilitate hip extension/stepping strategy. Static balance with dual-tasking demands maintaining good stability throughout. LE stretching for improved flexibility and reduced rigidity w/ greater issues on RLE.   OBJECTIVE IMPAIRMENTS: Abnormal gait, cardiopulmonary status limiting activity, decreased activity tolerance, decreased balance, decreased coordination, decreased endurance, decreased knowledge of use of DME, difficulty walking, decreased ROM, decreased strength, dizziness, impaired flexibility, impaired tone, improper body mechanics, postural dysfunction, and pain.   ACTIVITY LIMITATIONS: carrying, lifting, bending, sitting, standing, squatting, stairs, transfers, bathing, toileting, dressing, reach over head, hygiene/grooming, locomotion level, and caring for others  PARTICIPATION LIMITATIONS: meal prep, cleaning, laundry, shopping, community activity, yard work, and church  PERSONAL FACTORS: Age, Past/current experiences, Time since onset of injury/illness/exacerbation, and 3+ comorbidities: HTN, GERD, HLD, HTN, ventricular diastolic dysfunction are also affecting patient's functional outcome.   REHAB POTENTIAL: Good  CLINICAL DECISION MAKING: Evolving/moderate complexity  EVALUATION COMPLEXITY: Moderate  PLAN:  PT FREQUENCY:  1-2x/week  PT DURATION: 6 weeks  PLANNED INTERVENTIONS: 97164- PT Re-evaluation, 97750- Physical Performance Testing, 97110-Therapeutic exercises, 97530- Therapeutic activity, [id number]- Neuromuscular re-education, 97535- Self Care, [id number]- Manual therapy, [id number]- Gait training, [phone]- Canalith repositioning, [id number]- Aquatic Therapy, [phone] (1-2 muscles), [id number] (3+ muscles)- Dry Needling, Patient/Family education, Balance training, Stair training, Taping, Vestibular training, DME instructions, Cryotherapy, and Moist heat  PLAN FOR NEXT SESSION:   Try resisted gait activities at weightrack; Progress for hip and core strengthening, posture, balance    11:03 AM, 12/22/23 M. Kelly Jelani Vreeland, PT, DPT Physical Therapist- [place] Office Number: [phone]

## 2023-12-26 ENCOUNTER — Telehealth (HOSPITAL_COMMUNITY): Payer: Self-pay | Admitting: Emergency Medicine

## 2023-12-26 ENCOUNTER — Encounter (HOSPITAL_COMMUNITY): Payer: Self-pay

## 2023-12-26 ENCOUNTER — Encounter: Payer: Self-pay | Admitting: Physical Therapy

## 2023-12-26 ENCOUNTER — Ambulatory Visit: Admitting: Physical Therapy

## 2023-12-26 DIAGNOSIS — M6281 Muscle weakness (generalized): Secondary | ICD-10-CM

## 2023-12-26 DIAGNOSIS — R262 Difficulty in walking, not elsewhere classified: Secondary | ICD-10-CM | POA: Diagnosis not present

## 2023-12-26 DIAGNOSIS — R2681 Unsteadiness on feet: Secondary | ICD-10-CM

## 2023-12-26 DIAGNOSIS — R471 Dysarthria and anarthria: Secondary | ICD-10-CM | POA: Diagnosis not present

## 2023-12-26 DIAGNOSIS — R293 Abnormal posture: Secondary | ICD-10-CM | POA: Diagnosis not present

## 2023-12-26 DIAGNOSIS — M5459 Other low back pain: Secondary | ICD-10-CM | POA: Diagnosis not present

## 2023-12-26 DIAGNOSIS — R1312 Dysphagia, oropharyngeal phase: Secondary | ICD-10-CM | POA: Diagnosis not present

## 2023-12-26 DIAGNOSIS — R4701 Aphasia: Secondary | ICD-10-CM | POA: Diagnosis not present

## 2023-12-26 DIAGNOSIS — F801 Expressive language disorder: Secondary | ICD-10-CM | POA: Diagnosis not present

## 2023-12-26 DIAGNOSIS — R41841 Cognitive communication deficit: Secondary | ICD-10-CM | POA: Diagnosis not present

## 2023-12-26 NOTE — Therapy (Signed)
 OUTPATIENT PHYSICAL THERAPY NEURO TREATMENT   Patient Name: Drew Andrews MRN: [id number] DOB:[birth year], 78 y.o., male Today's Date: 12/26/2023   PCP: Vernadine Charlie ORN, MD  REFERRING PROVIDER: Onita Duos, MD  END OF SESSION:  PT End of Session - 12/26/23 0936     Visit Number 7    Number of Visits 13    Date for PT Re-Evaluation 01/17/24    Authorization Type [employer]    PT Start Time 0935    PT Stop Time 1015    PT Time Calculation (min) 40 min    Activity Tolerance Patient tolerated treatment well    Behavior During Therapy [facility] for tasks assessed/performed            Past Medical History:  Diagnosis Date   Ascending aorta dilation (HCC) 09/14/2023   TTE 09/13/2023: EF 60-65, no RWMA, GR 1 DD, normal RVSF, trivial MR, moderate MAC, AVR with normal structure and function, mild dilation of ascending aorta (40 mm)    Bursitis of elbow 05/20/2013   Essential hypertension 03/12/2013   GERD (gastroesophageal reflux disease)    Hyperlipidemia    Hypertension    Left ventricular diastolic dysfunction, NYHA class 2 03/12/2013   LVH (left ventricular hypertrophy)    with aortic stenosis-bicuspid   PONV (postoperative nausea and vomiting)    as a child   S/P AVR (aortic valve replacement) 05/13/2013   S/P AVR, 05/13/13, 25 mm Edwards Magna-Ease pericardial valve 05/13/2013   Seasonal allergies    Severe aortic stenosis 03/12/2013   Past Surgical History:  Procedure Laterality Date   AORTIC VALVE REPLACEMENT N/A 05/13/2013   Procedure: AORTIC VALVE REPLACEMENT (AVR);  Surgeon: Dorise MARLA Fellers, MD;  Location: [facility] OR;  Service: Open Heart Surgery;  Laterality: N/A;   APPENDECTOMY  age 7   CARDIAC CATHETERIZATION     COLONOSCOPY     X 2   HIATAL HERNIA REPAIR     INTRAOPERATIVE TRANSESOPHAGEAL ECHOCARDIOGRAM N/A 05/13/2013   Procedure: INTRAOPERATIVE TRANSESOPHAGEAL ECHOCARDIOGRAM;  Surgeon: Dorise MARLA Fellers, MD;  Location: [facility] OR;  Service: Open Heart  Surgery;  Laterality: N/A;   LEFT HEART CATHETERIZATION WITH CORONARY ANGIOGRAM N/A 03/15/2013   Procedure: LEFT HEART CATHETERIZATION WITH CORONARY ANGIOGRAM;  Surgeon: Debby DELENA Sor, MD;  Location: [facility] CATH LAB;  Service: Cardiovascular;  Laterality: N/A;   TONSILLECTOMY     TRANSTHORACIC ECHOCARDIOGRAM  03/04/2013   EF 55-60%, grade 2 diastolic dysfunction, AV with mod calcified leaflets & mild regurg, calcified MV annulus, LA mod dilated, RV mildly dilated   Patient Active Problem List   Diagnosis Date Noted   Ascending aorta dilation (HCC) 09/14/2023   CAD (coronary artery disease) 08/09/2023   Low back pain with sciatica 07/07/2023   Anxiety 04/17/2023   Cognitive impairment 01/12/2023   Parkinson's disease without dyskinesia or fluctuating manifestations (HCC) 01/12/2023   GERD (gastroesophageal reflux disease) 11/15/2015   Diplopia 02/21/2014   S/P AVR (aortic valve replacement) 07/13/2013   Rash of back, contact dermatitis resolved 05/31/2013   Bursitis of elbow 05/20/2013   S/P AVR, 05/13/13, 25 mm Edwards Magna-Ease pericardial valve 05/13/2013   Severe aortic stenosis 03/12/2013   Hyperlipidemia 03/12/2013   Essential hypertension 03/12/2013   Left ventricular diastolic dysfunction, NYHA class 2 03/12/2013    ONSET DATE: January 2025   REFERRING DIAG: R13.12 (ICD-10-CM) - Dysphagia, oropharyngeal phase F80.1 (ICD-10-CM) - Expressive language disorder R26.81 (ICD-10-CM) - Unsteadiness on feet  THERAPY DIAG:  Unsteadiness on feet  Muscle weakness (  generalized)  Rationale for Evaluation and Treatment: Rehabilitation  SUBJECTIVE:                                                                                                                                                                                             SUBJECTIVE STATEMENT: To have a stress test tomorrow-maybe ahead of a potential back surgery.  Pt accompanied by: self   PERTINENT HISTORY: HTN, GERD,  HLD, HTN, ventricular diastolic dysfunction  PAIN:  Are you having pain? Yes: NPRS scale: I don't hurt bad Pain location: B LEs Pain description: sharp Aggravating factors: walking Relieving factors: Prednisone *Pt rates 1-2/10 pain in lower abdomen-long standing PRECAUTIONS: Fall  RED FLAGS: None   WEIGHT BEARING RESTRICTIONS: No  FALLS: Has patient fallen in last 6 months? No  LIVING ENVIRONMENT: Lives with: lives with their spouse Lives in: House/apartment Stairs: 4-5 steps to enter with rail; 2 steps in the back with a handle; 2 story home Has following equipment at home: Vannie - 4 wheeled, Grab bars, and walking poles   PLOF: Independent; pt used to risk his bike  PATIENT GOALS: per wife- get pt more active and improve standing tolerance   OBJECTIVE:    TODAY'S TREATMENT: 12/26/2023 Activity Comments  NuStep, Level 5, 4 extremities x 8 minutes  Vitals 97%, HR 77 bpm 2 min warm up, 30 sec intervals >100 SPM for increased intensity 2 min cool down  Forward/back walking x 2 min Sidestep over obstacles x 2 min Gait with head turns x 1 min Gait with head nods x 1 min Decr arm swing  Four square step activity-multiple reps and added obstacle for side step and Airex for Cues for wide BOS to stop and recover balance before transitioning; very narrow BOS  Sit to stand from varied surfaces 3 x 5 reps 1 episode of retropulsion  Stair negotiation x 3 reps Cues for full foot placement ascending and large, deliberate kick forward to clear foot when descending       HOME EXERCISE PROGRAM: Access Code: [email or web address] URL: [email or web address] Date: 12/15/2023 Prepared by: [facility] - Outpatient  Rehab - [facility]  Exercises - Seated Lumbar Flexion Stretch  - 1 x daily - 7 x weekly - 1 sets - 10 reps - deep breaths hold - Hooklying Single Knee to Chest  - 1 x daily - 7 x weekly - 3 sets - 10 deep breaths hold - Supine Double Knee to Chest  - 1 x daily -  7 x weekly - 3 sets - 10 deep breaths hold - Staggered Stance Forward Backward Weight Shift with Counter Support  -  1 x daily - 7 x weekly - 1-2 sets - 10 reps           PATIENT EDUCATION: Education details: Continue current HEP Person educated: Patient and Spouse Education method: Explanation Education comprehension: verbalized understanding  OTHOSTATICS: Supine: 159/98 with HR 58 Standing x 1 min: 107/75 with HR 72  Standing x 3 min: 103/75 with HR 72 --------------------------------------------------------- Note: Objective measures were completed at Evaluation unless otherwise noted.  DIAGNOSTIC FINDINGS: lumbar xray 07/07/23: Multilevel degenerative disc disease and facet disease.   COGNITION: Overall cognitive status: Within functional limits for tasks assessed   SENSATION: Pt denies N/T in UEs/LEs   COORDINATION: Alternating pronation/supination: B dysdiadochokinesia Alternating toe tap: WFL B Finger to nose: WFL B  MUSCLE TONE: 2 beats of clonus in L ankle  POSTURE: rounded shoulders, forward head, flexed trunk , and slight L trunk lean  LOWER EXTREMITY ROM:     Active  Right Eval Left Eval  Hip flexion    Hip extension    Hip abduction    Hip adduction    Hip internal rotation    Hip external rotation    Knee flexion    Knee extension    Ankle dorsiflexion 10 deg with knee slightly flexed 4 deg with knee slightly flexed  Ankle plantarflexion    Ankle inversion    Ankle eversion     (Blank rows = not tested)   Vitals checked d/t pt reporting mild SOB from assessment thus far: 105/74 mmHg, 65 bpm, 98% spO2   LOWER EXTREMITY MMT:    MMT *in sitting  Right Eval Left Eval  Hip flexion 4+ 4+  Hip extension    Hip abduction 4 4  Hip adduction 4 4  Hip internal rotation    Hip external rotation    Knee flexion 5 5  Knee extension 5 5  Ankle dorsiflexion 5 5  Ankle plantarflexion 5 5  Ankle inversion    Ankle eversion    (Blank rows = not  tested)  GAIT: Findings: Assistive device utilized:None, Level of assistance: Modified independence, and Comments: trunk flexed forward and to L; quick speed but steps are short and discontinuous   FUNCTIONAL TESTS:  5 times sit to stand: 14.74 sec without UE support and cues to stand up fully; 97% spO2                                                                          TREATMENT DATE: 12/07/23      GOALS: Goals reviewed with patient? Yes  SHORT TERM GOALS: Target date: 12/27/2023  Patient to be independent with initial HEP. Baseline: HEP initiated Goal status: INITIAL    LONG TERM GOALS: Target date: 01/17/2024  Patient to be independent with advanced HEP. Baseline: Not yet initiated  Goal status: INITIAL  Patient to return to walking for exercise for at least 15 minutes at a time without LBP or SOB limiting.  Baseline: Not yet initiated  Goal status: INITIAL  Patient to score at least 22/30 on FGA in order to decrease risk of falls.   Baseline: 20/30 Goal status: INITIAL  Patient to demonstrate STS with 8 out of 10 reps without retropulsion and with upright  posture.  Baseline: tendency not to stand fully upright Goal status: INITIAL  Demo improved endurance and gait speed per distance 1,035 ft during Baseline: 765 ft RPE 8/10 Goal status: INITIAL  Patient to report tolerance for 30 minutes of standing without LBP limiting in order to prepare a meal.  Baseline: reports unable Goal status: INITIAL    ASSESSMENT:  CLINICAL IMPRESSION: Pt presents today with no new complaints. Skilled PT session focused on aerobic warm up + dynamic balance activities + sit to stand from varied surfaces. Pt has several LOB with stopping quickly from backwards or side directions in four-square step activity.  He does have very narrow BOS upon quickly stopping, so worked on cues for wider BOS and PWR! Up posture between transitions. Pt will continue to benefit from skilled  PT towards goals for improved functional mobility and decreased fall risk.   OBJECTIVE IMPAIRMENTS: Abnormal gait, cardiopulmonary status limiting activity, decreased activity tolerance, decreased balance, decreased coordination, decreased endurance, decreased knowledge of use of DME, difficulty walking, decreased ROM, decreased strength, dizziness, impaired flexibility, impaired tone, improper body mechanics, postural dysfunction, and pain.   ACTIVITY LIMITATIONS: carrying, lifting, bending, sitting, standing, squatting, stairs, transfers, bathing, toileting, dressing, reach over head, hygiene/grooming, locomotion level, and caring for others  PARTICIPATION LIMITATIONS: meal prep, cleaning, laundry, shopping, community activity, yard work, and church  PERSONAL FACTORS: Age, Past/current experiences, Time since onset of injury/illness/exacerbation, and 3+ comorbidities: HTN, GERD, HLD, HTN, ventricular diastolic dysfunction are also affecting patient's functional outcome.   REHAB POTENTIAL: Good  CLINICAL DECISION MAKING: Evolving/moderate complexity  EVALUATION COMPLEXITY: Moderate  PLAN:  PT FREQUENCY: 1-2x/week  PT DURATION: 6 weeks  PLANNED INTERVENTIONS: 97164- PT Re-evaluation, 97750- Physical Performance Testing, 97110-Therapeutic exercises, 97530- Therapeutic activity, [id number]- Neuromuscular re-education, 97535- Self Care, [id number]- Manual therapy, [id number]- Gait training, [phone]- Canalith repositioning, [id number]- Aquatic Therapy, [phone] (1-2 muscles), [id number] (3+ muscles)- Dry Needling, Patient/Family education, Balance training, Stair training, Taping, Vestibular training, DME instructions, Cryotherapy, and Moist heat  PLAN FOR NEXT SESSION:   Check HEP for STG; stair training-focus on foot clearance descending.  Try resisted gait activities at [facility]; Progress for hip and core strengthening, posture, balance   Greig Anon, PT 12/26/23 11:23 AM Phone: [phone] Fax:  [phone]  [facility] at [facility] [address], [address] [place], [place] [address] Phone # [phone] Fax # [phone]

## 2023-12-26 NOTE — Telephone Encounter (Signed)
 Unable to leave vm.

## 2023-12-27 ENCOUNTER — Ambulatory Visit (HOSPITAL_COMMUNITY)
Admission: RE | Admit: 2023-12-27 | Discharge: 2023-12-27 | Disposition: A | Discharge status: Home / Self Care | Source: Ambulatory Visit | Attending: Cardiovascular Disease | Admitting: Cardiovascular Disease

## 2023-12-27 DIAGNOSIS — I2510 Atherosclerotic heart disease of native coronary artery without angina pectoris: Secondary | ICD-10-CM | POA: Diagnosis not present

## 2023-12-27 DIAGNOSIS — I10 Essential (primary) hypertension: Secondary | ICD-10-CM | POA: Insufficient documentation

## 2023-12-27 DIAGNOSIS — I350 Nonrheumatic aortic (valve) stenosis: Secondary | ICD-10-CM | POA: Diagnosis present

## 2023-12-27 LAB — NM PET CT CARDIAC PERFUSION MULTI W/ABSOLUTE BLOODFLOW
MBFR: 2.2
Nuc Rest EF: 69 %
Nuc Stress EF: 69 %
Rest MBF: 0.88 ml/g/min
Rest Nuclear Isotope Dose: 20.6 mCi
ST Depression (mm): 0 mm
Stress MBF: 1.94 ml/g/min
Stress Nuclear Isotope Dose: 21 mCi

## 2023-12-27 MED ORDER — REGADENOSON 0.4 MG/5ML IV SOLN
INTRAVENOUS | Status: AC
Start: 2023-12-27 — End: 2023-12-27
  Filled 2023-12-27: qty 5

## 2023-12-27 MED ORDER — RUBIDIUM RB82 GENERATOR (RUBYFILL)
21.0200 | PACK | Freq: Once | INTRAVENOUS | Status: AC
  Administered 2023-12-27: 21.02 via INTRAVENOUS

## 2023-12-27 MED ORDER — RUBIDIUM RB82 GENERATOR (RUBYFILL)
20.6400 | PACK | Freq: Once | INTRAVENOUS | Status: AC
  Administered 2023-12-27: 20.64 via INTRAVENOUS

## 2023-12-27 MED ORDER — REGADENOSON 0.4 MG/5ML IV SOLN
0.4000 mg | Freq: Once | INTRAVENOUS | Status: AC
  Administered 2023-12-27: 0.4 mg via INTRAVENOUS

## 2023-12-28 NOTE — Therapy (Signed)
 OUTPATIENT PHYSICAL THERAPY NEURO TREATMENT   Patient Name: Drew Andrews MRN: [id number] DOB:1945/05/06, 78 y.o., male Today's Date: 12/29/2023   PCP: Vernadine Charlie ORN, MD  REFERRING PROVIDER: Onita Duos, MD  END OF SESSION:  PT End of Session - 12/29/23 1014     Visit Number 8    Number of Visits 13    Date for PT Re-Evaluation 01/17/24    Authorization Type [employer]    PT Start Time 0935    PT Stop Time 1013    PT Time Calculation (min) 38 min    Equipment Utilized During Treatment Gait belt    Activity Tolerance Patient tolerated treatment well    Behavior During Therapy WFL for tasks assessed/performed             Past Medical History:  Diagnosis Date   Ascending aorta dilation (HCC) 09/14/2023   TTE 09/13/2023: EF 60-65, no RWMA, GR 1 DD, normal RVSF, trivial MR, moderate MAC, AVR with normal structure and function, mild dilation of ascending aorta (40 mm)    Bursitis of elbow 05/20/2013   Essential hypertension 03/12/2013   GERD (gastroesophageal reflux disease)    Hyperlipidemia    Hypertension    Left ventricular diastolic dysfunction, NYHA class 2 03/12/2013   LVH (left ventricular hypertrophy)    with aortic stenosis-bicuspid   PONV (postoperative nausea and vomiting)    as a child   S/P AVR (aortic valve replacement) 05/13/2013   S/P AVR, 05/13/13, 25 mm Edwards Magna-Ease pericardial valve 05/13/2013   Seasonal allergies    Severe aortic stenosis 03/12/2013   Past Surgical History:  Procedure Laterality Date   AORTIC VALVE REPLACEMENT N/A 05/13/2013   Procedure: AORTIC VALVE REPLACEMENT (AVR);  Surgeon: Dorise MARLA Fellers, MD;  Location: [facility] OR;  Service: Open Heart Surgery;  Laterality: N/A;   APPENDECTOMY  age 89   CARDIAC CATHETERIZATION     COLONOSCOPY     X 2   HIATAL HERNIA REPAIR     INTRAOPERATIVE TRANSESOPHAGEAL ECHOCARDIOGRAM N/A 05/13/2013   Procedure: INTRAOPERATIVE TRANSESOPHAGEAL ECHOCARDIOGRAM;  Surgeon: Dorise MARLA Fellers, MD;  Location: [facility] OR;  Service: Open Heart Surgery;  Laterality: N/A;   LEFT HEART CATHETERIZATION WITH CORONARY ANGIOGRAM N/A 03/15/2013   Procedure: LEFT HEART CATHETERIZATION WITH CORONARY ANGIOGRAM;  Surgeon: Debby DELENA Sor, MD;  Location: [facility] CATH LAB;  Service: Cardiovascular;  Laterality: N/A;   TONSILLECTOMY     TRANSTHORACIC ECHOCARDIOGRAM  03/04/2013   EF 55-60%, grade 2 diastolic dysfunction, AV with mod calcified leaflets & mild regurg, calcified MV annulus, LA mod dilated, RV mildly dilated   Patient Active Problem List   Diagnosis Date Noted   Ascending aorta dilation (HCC) 09/14/2023   CAD (coronary artery disease) 08/09/2023   Low back pain with sciatica 07/07/2023   Anxiety 04/17/2023   Cognitive impairment 01/12/2023   Parkinson's disease without dyskinesia or fluctuating manifestations (HCC) 01/12/2023   GERD (gastroesophageal reflux disease) 11/15/2015   Diplopia 02/21/2014   S/P AVR (aortic valve replacement) 07/13/2013   Rash of back, contact dermatitis resolved 05/31/2013   Bursitis of elbow 05/20/2013   S/P AVR, 05/13/13, 25 mm Edwards Magna-Ease pericardial valve 05/13/2013   Severe aortic stenosis 03/12/2013   Hyperlipidemia 03/12/2013   Essential hypertension 03/12/2013   Left ventricular diastolic dysfunction, NYHA class 2 03/12/2013    ONSET DATE: January 2025   REFERRING DIAG: R13.12 (ICD-10-CM) - Dysphagia, oropharyngeal phase F80.1 (ICD-10-CM) - Expressive language disorder R26.81 (ICD-10-CM) - Unsteadiness on feet  THERAPY DIAG:  Unsteadiness on feet  Muscle weakness (generalized)  Abnormal posture  Difficulty in walking, not elsewhere classified  Other low back pain  Rationale for Evaluation and Treatment: Rehabilitation  SUBJECTIVE:                                                                                                                                                                                             SUBJECTIVE  STATEMENT: Yesterday I was walking a little bit slower but this morning I am walking fast again. Reports that he has returned to walking with walking sticks in his neighborhood and can tolerate 10 min.   Pt accompanied by: self   PERTINENT HISTORY: HTN, GERD, HLD, HTN, ventricular diastolic dysfunction  PAIN:  Are you having pain? Yes: NPRS scale: feeling pretty good Pain location: B LEs Pain description: sharp Aggravating factors: walking Relieving factors: Prednisone *Pt rates 1-2/10 pain in lower abdomen-long standing PRECAUTIONS: Fall  RED FLAGS: None   WEIGHT BEARING RESTRICTIONS: No  FALLS: Has patient fallen in last 6 months? No  LIVING ENVIRONMENT: Lives with: lives with their spouse Lives in: House/apartment Stairs: 4-5 steps to enter with rail; 2 steps in the back with a handle; 2 story home Has following equipment at home: Vannie - 4 wheeled, Grab bars, and walking poles   PLOF: Independent; pt used to ride his bike  PATIENT GOALS: per wife- get pt more active and improve standing tolerance   OBJECTIVE:     TODAY'S TREATMENT: 12/29/23 Activity Comments  Review of HEP: - Seated Lumbar Flexion Stretch-10 deep breaths hold - Hooklying Single Knee to Chest - 10 deep breaths hold - Supine Double Knee to Chest  - 10 deep breaths hold - Staggered Stance Forward Backward Weight Shift with Counter Support  -10x each Pt reports some discomfort in LB with flexion stretches but this quickly resolves upon letting go of stretch. Pt requires cues to increase anterior wt shift and maintain more neutral spine rather than flexed posture with staggered wt shift. Pt reports some mild LE pain with standing activity as well   stair navigation with cues to focus on foot clearance descending Performed safely but quickly simulating home environment- R handrail ascending, L handrail descending. Then performed without UE support with cueing to stand more upright to avoid excessive  anterior wt shift. Then performed with 10# weight in 1 hand simulating carrying dog.   step downs with heel touch Weaned to 1 UE support with cueing for pacing and eccentric control   weighted pulley resisted walking:  ant/post 10-20lbs pos/ant 15 lbs  CGA and cueing for large  but slow controlled steps. Pt reports difficulty controlling backwards momentum. Used visual cues to maintain upright posture.           PATIENT EDUCATION: Education details: discussed safety walking outside for exercise, especially up/down hills Person educated: Patient Education method: Explanation, Demonstration, Tactile cues, and Verbal cues Education comprehension: verbalized understanding and returned demonstration   HOME EXERCISE PROGRAM: Access Code: [email or web address] URL: [email or web address] Date: 12/15/2023 Prepared by: [facility] - Outpatient  Rehab - [facility]  Exercises - Seated Lumbar Flexion Stretch  - 1 x daily - 7 x weekly - 1 sets - 10 reps - deep breaths hold - Hooklying Single Knee to Chest  - 1 x daily - 7 x weekly - 3 sets - 10 deep breaths hold - Supine Double Knee to Chest  - 1 x daily - 7 x weekly - 3 sets - 10 deep breaths hold - Staggered Stance Forward Backward Weight Shift with Counter Support  - 1 x daily - 7 x weekly - 1-2 sets - 10 reps      --------------------------------------------------------- Note: Objective measures were completed at Evaluation unless otherwise noted.  DIAGNOSTIC FINDINGS: lumbar xray 07/07/23: Multilevel degenerative disc disease and facet disease.   COGNITION: Overall cognitive status: Within functional limits for tasks assessed   SENSATION: Pt denies N/T in UEs/LEs   COORDINATION: Alternating pronation/supination: B dysdiadochokinesia Alternating toe tap: WFL B Finger to nose: WFL B  MUSCLE TONE: 2 beats of clonus in L ankle  POSTURE: rounded shoulders, forward head, flexed trunk , and slight L trunk lean  LOWER EXTREMITY  ROM:     Active  Right Eval Left Eval  Hip flexion    Hip extension    Hip abduction    Hip adduction    Hip internal rotation    Hip external rotation    Knee flexion    Knee extension    Ankle dorsiflexion 10 deg with knee slightly flexed 4 deg with knee slightly flexed  Ankle plantarflexion    Ankle inversion    Ankle eversion     (Blank rows = not tested)   Vitals checked d/t pt reporting mild SOB from assessment thus far: 105/74 mmHg, 65 bpm, 98% spO2   LOWER EXTREMITY MMT:    MMT *in sitting  Right Eval Left Eval  Hip flexion 4+ 4+  Hip extension    Hip abduction 4 4  Hip adduction 4 4  Hip internal rotation    Hip external rotation    Knee flexion 5 5  Knee extension 5 5  Ankle dorsiflexion 5 5  Ankle plantarflexion 5 5  Ankle inversion    Ankle eversion    (Blank rows = not tested)  GAIT: Findings: Assistive device utilized:None, Level of assistance: Modified independence, and Comments: trunk flexed forward and to L; quick speed but steps are short and discontinuous   FUNCTIONAL TESTS:  5 times sit to stand: 14.74 sec without UE support and cues to stand up fully; 97% spO2                                                                          TREATMENT DATE: 12/07/23      GOALS: Goals reviewed  with patient? Yes  SHORT TERM GOALS: Target date: 12/27/2023  Patient to be independent with initial HEP. Baseline: HEP initiated Goal status: MET 12/29/23    LONG TERM GOALS: Target date: 01/17/2024  Patient to be independent with advanced HEP. Baseline: Not yet initiated  Goal status: IN PROGRESS  Patient to return to walking for exercise for at least 15 minutes at a time without LBP or SOB limiting.  Baseline: Not yet initiated  Goal status: IN PROGRESS  Patient to score at least 22/30 on FGA in order to decrease risk of falls.   Baseline: 20/30 Goal status: IN PROGRESS  Patient to demonstrate STS with 8 out of 10 reps without retropulsion  and with upright posture.  Baseline: tendency not to stand fully upright Goal status: IN PROGRESS  Demo improved endurance and gait speed per distance 1,035 ft during Baseline: 765 ft RPE 8/10 Goal status: IN PROGRESS  Patient to report tolerance for 30 minutes of standing without LBP limiting in order to prepare a meal.  Baseline: reports unable Goal status: IN PROGRESS    ASSESSMENT:  CLINICAL IMPRESSION: Patient arrived to session without new complaints. Reviewed HEP for max benefit and carryover. Proceeded with stair navigation and step downs with cues for pacing and control.  Patient appeared steady and safe but requires cueing for more upright posture. Similar cues required with resisted walking, to prevent posterior LOB with backwards walking. Patient tolerated session well and without complaints upon leaving.   OBJECTIVE IMPAIRMENTS: Abnormal gait, cardiopulmonary status limiting activity, decreased activity tolerance, decreased balance, decreased coordination, decreased endurance, decreased knowledge of use of DME, difficulty walking, decreased ROM, decreased strength, dizziness, impaired flexibility, impaired tone, improper body mechanics, postural dysfunction, and pain.   ACTIVITY LIMITATIONS: carrying, lifting, bending, sitting, standing, squatting, stairs, transfers, bathing, toileting, dressing, reach over head, hygiene/grooming, locomotion level, and caring for others  PARTICIPATION LIMITATIONS: meal prep, cleaning, laundry, shopping, community activity, yard work, and church  PERSONAL FACTORS: Age, Past/current experiences, Time since onset of injury/illness/exacerbation, and 3+ comorbidities: HTN, GERD, HLD, HTN, ventricular diastolic dysfunction are also affecting patient's functional outcome.   REHAB POTENTIAL: Good  CLINICAL DECISION MAKING: Evolving/moderate complexity  EVALUATION COMPLEXITY: Moderate  PLAN:  PT FREQUENCY: 1-2x/week  PT DURATION: 6  weeks  PLANNED INTERVENTIONS: 97164- PT Re-evaluation, 97750- Physical Performance Testing, 97110-Therapeutic exercises, 97530- Therapeutic activity, [id number]- Neuromuscular re-education, 97535- Self Care, [id number]- Manual therapy, [id number]- Gait training, [phone]- Canalith repositioning, [id number]- Aquatic Therapy, [phone] (1-2 muscles), [id number] (3+ muscles)- Dry Needling, Patient/Family education, Balance training, Stair training, Taping, Vestibular training, DME instructions, Cryotherapy, and Moist heat  PLAN FOR NEXT SESSION:   work on pacing and control; stair training-focus on foot clearance descending.  Try resisted gait activities at [facility]; Progress for hip and core strengthening, posture, balance    [name], PT, DPT 12/29/23 10:24 AM  [facility] at [facility] [address] [place], [address] [place], [place] [address] Phone # [phone] Fax # [phone]

## 2023-12-28 NOTE — Therapy (Signed)
 OUTPATIENT PHYSICAL THERAPY NEURO TREATMENT   Patient Name: Drew Andrews MRN: [id number] DOB:May 20, 1945, 78 y.o., male Today's Date: 01/02/2024   PCP: Vernadine Charlie ORN, MD  REFERRING PROVIDER: Onita Duos, MD  END OF SESSION:  PT End of Session - 01/02/24 1048     Visit Number 9    Number of Visits 13    Date for PT Re-Evaluation 01/17/24    Authorization Type [employer]    PT Start Time 0935    PT Stop Time 1017    PT Time Calculation (min) 42 min    Equipment Utilized During Treatment Gait belt    Activity Tolerance Patient tolerated treatment well    Behavior During Therapy WFL for tasks assessed/performed             Past Medical History:  Diagnosis Date   Ascending aorta dilation (HCC) 09/14/2023   TTE 09/13/2023: EF 60-65, no RWMA, GR 1 DD, normal RVSF, trivial MR, moderate MAC, AVR with normal structure and function, mild dilation of ascending aorta (40 mm)    Bursitis of elbow 05/20/2013   Essential hypertension 03/12/2013   GERD (gastroesophageal reflux disease)    Hyperlipidemia    Hypertension    Left ventricular diastolic dysfunction, NYHA class 2 03/12/2013   LVH (left ventricular hypertrophy)    with aortic stenosis-bicuspid   PONV (postoperative nausea and vomiting)    as a child   S/P AVR (aortic valve replacement) 05/13/2013   S/P AVR, 05/13/13, 25 mm Edwards Magna-Ease pericardial valve 05/13/2013   Seasonal allergies    Severe aortic stenosis 03/12/2013   Past Surgical History:  Procedure Laterality Date   AORTIC VALVE REPLACEMENT N/A 05/13/2013   Procedure: AORTIC VALVE REPLACEMENT (AVR);  Surgeon: Dorise MARLA Fellers, MD;  Location: [facility] OR;  Service: Open Heart Surgery;  Laterality: N/A;   APPENDECTOMY  age 89   CARDIAC CATHETERIZATION     COLONOSCOPY     X 2   HIATAL HERNIA REPAIR     INTRAOPERATIVE TRANSESOPHAGEAL ECHOCARDIOGRAM N/A 05/13/2013   Procedure: INTRAOPERATIVE TRANSESOPHAGEAL ECHOCARDIOGRAM;  Surgeon: Dorise MARLA Fellers, MD;  Location: [facility] OR;  Service: Open Heart Surgery;  Laterality: N/A;   LEFT HEART CATHETERIZATION WITH CORONARY ANGIOGRAM N/A 03/15/2013   Procedure: LEFT HEART CATHETERIZATION WITH CORONARY ANGIOGRAM;  Surgeon: Debby DELENA Sor, MD;  Location: [facility] CATH LAB;  Service: Cardiovascular;  Laterality: N/A;   TONSILLECTOMY     TRANSTHORACIC ECHOCARDIOGRAM  03/04/2013   EF 55-60%, grade 2 diastolic dysfunction, AV with mod calcified leaflets & mild regurg, calcified MV annulus, LA mod dilated, RV mildly dilated   Patient Active Problem List   Diagnosis Date Noted   Ascending aorta dilation (HCC) 09/14/2023   CAD (coronary artery disease) 08/09/2023   Low back pain with sciatica 07/07/2023   Anxiety 04/17/2023   Cognitive impairment 01/12/2023   Parkinson's disease without dyskinesia or fluctuating manifestations (HCC) 01/12/2023   GERD (gastroesophageal reflux disease) 11/15/2015   Diplopia 02/21/2014   S/P AVR (aortic valve replacement) 07/13/2013   Rash of back, contact dermatitis resolved 05/31/2013   Bursitis of elbow 05/20/2013   S/P AVR, 05/13/13, 25 mm Edwards Magna-Ease pericardial valve 05/13/2013   Severe aortic stenosis 03/12/2013   Hyperlipidemia 03/12/2013   Essential hypertension 03/12/2013   Left ventricular diastolic dysfunction, NYHA class 2 03/12/2013    ONSET DATE: January 2025   REFERRING DIAG: R13.12 (ICD-10-CM) - Dysphagia, oropharyngeal phase F80.1 (ICD-10-CM) - Expressive language disorder R26.81 (ICD-10-CM) - Unsteadiness on feet  THERAPY DIAG:  Unsteadiness on feet  Muscle weakness (generalized)  Abnormal posture  Difficulty in walking, not elsewhere classified  Other low back pain  Rationale for Evaluation and Treatment: Rehabilitation  SUBJECTIVE:                                                                                                                                                                                             SUBJECTIVE  STATEMENT: A little more unsteady this AM.  Pt accompanied by: self   PERTINENT HISTORY: HTN, GERD, HLD, HTN, ventricular diastolic dysfunction  PAIN:  Are you having pain? Yes: NPRS scale: 2/10 Pain location: gut Pain description: mild, tight Aggravating factors: lying down Relieving factors: nothing  PRECAUTIONS: Fall  RED FLAGS: None   WEIGHT BEARING RESTRICTIONS: No  FALLS: Has patient fallen in last 6 months? No  LIVING ENVIRONMENT: Lives with: lives with their spouse Lives in: House/apartment Stairs: 4-5 steps to enter with rail; 2 steps in the back with a handle; 2 story home Has following equipment at home: Vannie - 4 wheeled, Grab bars, and walking poles   PLOF: Independent; pt used to ride his bike  PATIENT GOALS: per wife- get pt more active and improve standing tolerance   OBJECTIVE:     TODAY'S TREATMENT: 01/02/24 Activity Comments  TM walking 1.5 mph x 4 min Cueing for long rather than fast steps, upright posture, TKE in swing   alt step ups with 10# 2x1 min Pt reports unsteadiness. Trunk and knees flexed. Use of counting out loud to improve reciprocal stepping consistency   step up + opposite SKTC 10x  Weaned to 1 UE support; cues to reset posture mid-way through   4 square step  Instability; CGA, cueing for safe foot placement          PATIENT EDUCATION: Education details: discussed pt's concerns about his gut/chest pain when lying down- pt reports that he is undergoing cardiac workup, HEP update with edu for safety Person educated: Patient Education method: Explanation, Demonstration, Tactile cues, Verbal cues, and Handouts Education comprehension: verbalized understanding and returned demonstration    HOME EXERCISE PROGRAM: Access Code: [email or web address] URL: [email or web address] Date: 01/02/2024 Prepared by: [facility] - Outpatient  Rehab - [facility]  Program Notes perform stair activities with handrail  Exercises -  Seated Lumbar Flexion Stretch  - 1 x daily - 7 x weekly - 1 sets - 10 reps - deep breaths hold - Hooklying Single Knee to Chest  - 1 x daily - 7 x weekly - 3 sets - 10 deep breaths hold - Supine Double Knee to Chest  -  1 x daily - 7 x weekly - 3 sets - 10 deep breaths hold - Staggered Stance Forward Backward Weight Shift with Counter Support  - 1 x daily - 7 x weekly - 1-2 sets - 10 reps - Step Up  - 1 x daily - 5 x weekly - 2-3 sets - 1 min hold - Runner's Step Up/Down  - 1 x daily - 5 x weekly - 2 sets - 10 reps     --------------------------------------------------------- Note: Objective measures were completed at Evaluation unless otherwise noted.  DIAGNOSTIC FINDINGS: lumbar xray 07/07/23: Multilevel degenerative disc disease and facet disease.   COGNITION: Overall cognitive status: Within functional limits for tasks assessed   SENSATION: Pt denies N/T in UEs/LEs   COORDINATION: Alternating pronation/supination: B dysdiadochokinesia Alternating toe tap: WFL B Finger to nose: WFL B  MUSCLE TONE: 2 beats of clonus in L ankle  POSTURE: rounded shoulders, forward head, flexed trunk , and slight L trunk lean  LOWER EXTREMITY ROM:     Active  Right Eval Left Eval  Hip flexion    Hip extension    Hip abduction    Hip adduction    Hip internal rotation    Hip external rotation    Knee flexion    Knee extension    Ankle dorsiflexion 10 deg with knee slightly flexed 4 deg with knee slightly flexed  Ankle plantarflexion    Ankle inversion    Ankle eversion     (Blank rows = not tested)   Vitals checked d/t pt reporting mild SOB from assessment thus far: 105/74 mmHg, 65 bpm, 98% spO2   LOWER EXTREMITY MMT:    MMT *in sitting  Right Eval Left Eval  Hip flexion 4+ 4+  Hip extension    Hip abduction 4 4  Hip adduction 4 4  Hip internal rotation    Hip external rotation    Knee flexion 5 5  Knee extension 5 5  Ankle dorsiflexion 5 5  Ankle plantarflexion 5 5   Ankle inversion    Ankle eversion    (Blank rows = not tested)  GAIT: Findings: Assistive device utilized:None, Level of assistance: Modified independence, and Comments: trunk flexed forward and to L; quick speed but steps are short and discontinuous   FUNCTIONAL TESTS:  5 times sit to stand: 14.74 sec without UE support and cues to stand up fully; 97% spO2                                                                          TREATMENT DATE: 12/07/23      GOALS: Goals reviewed with patient? Yes  SHORT TERM GOALS: Target date: 12/27/2023  Patient to be independent with initial HEP. Baseline: HEP initiated Goal status: MET    LONG TERM GOALS: Target date: 01/17/2024  Patient to be independent with advanced HEP. Baseline: Not yet initiated  Goal status: IN PROGRESS  Patient to return to walking for exercise for at least 15 minutes at a time without LBP or SOB limiting.  Baseline: Not yet initiated  Goal status: IN PROGRESS  Patient to score at least 22/30 on FGA in order to decrease risk of falls.   Baseline: 20/30 Goal  status: IN PROGRESS  Patient to demonstrate STS with 8 out of 10 reps without retropulsion and with upright posture.  Baseline: tendency not to stand fully upright Goal status: IN PROGRESS  Demo improved endurance and gait speed per distance 1,035 ft during Baseline: 765 ft RPE 8/10 Goal status: IN PROGRESS  Patient to report tolerance for 30 minutes of standing without LBP limiting in order to prepare a meal.  Baseline: reports unable Goal status: IN PROGRESS    ASSESSMENT:  CLINICAL IMPRESSION: Patient arrived to session with report of increased unsteadiness this AM. Worked on gait training on treadmill with cueing for improved TKE to improve step length. Alternating step ups with weighted resistance caused c/o unsteadiness. As patient reports carrying his dog up/down the stairs, this task will need to be practiced. Updated these tasks  into HEP with edu for safety as patient appeared safe as long as he used single handrail. Patient reported understanding and without complaints upon leaving.   OBJECTIVE IMPAIRMENTS: Abnormal gait, cardiopulmonary status limiting activity, decreased activity tolerance, decreased balance, decreased coordination, decreased endurance, decreased knowledge of use of DME, difficulty walking, decreased ROM, decreased strength, dizziness, impaired flexibility, impaired tone, improper body mechanics, postural dysfunction, and pain.   ACTIVITY LIMITATIONS: carrying, lifting, bending, sitting, standing, squatting, stairs, transfers, bathing, toileting, dressing, reach over head, hygiene/grooming, locomotion level, and caring for others  PARTICIPATION LIMITATIONS: meal prep, cleaning, laundry, shopping, community activity, yard work, and church  PERSONAL FACTORS: Age, Past/current experiences, Time since onset of injury/illness/exacerbation, and 3+ comorbidities: HTN, GERD, HLD, HTN, ventricular diastolic dysfunction are also affecting patient's functional outcome.   REHAB POTENTIAL: Good  CLINICAL DECISION MAKING: Evolving/moderate complexity  EVALUATION COMPLEXITY: Moderate  PLAN:  PT FREQUENCY: 1-2x/week  PT DURATION: 6 weeks  PLANNED INTERVENTIONS: 97164- PT Re-evaluation, 97750- Physical Performance Testing, 97110-Therapeutic exercises, 97530- Therapeutic activity, [id number]- Neuromuscular re-education, 97535- Self Care, [id number]- Manual therapy, [id number]- Gait training, [phone]- Canalith repositioning, [id number]- Aquatic Therapy, [phone] (1-2 muscles), [id number] (3+ muscles)- Dry Needling, Patient/Family education, Balance training, Stair training, Taping, Vestibular training, DME instructions, Cryotherapy, and Moist heat  PLAN FOR NEXT SESSION:   ; stair training-focus on foot clearance descending.  Try resisted gait activities at [facility]; Progress for hip and core strengthening, posture, balance    [name], PT, DPT 01/02/24 10:50 AM  [facility] at [place] [address] [place], [address] [place], [place] [address] Phone # [phone] Fax # [phone]

## 2023-12-29 ENCOUNTER — Ambulatory Visit: Admitting: Physical Therapy

## 2023-12-29 ENCOUNTER — Encounter: Payer: Self-pay | Admitting: Physical Therapy

## 2023-12-29 DIAGNOSIS — R2681 Unsteadiness on feet: Secondary | ICD-10-CM | POA: Diagnosis not present

## 2023-12-29 DIAGNOSIS — R41841 Cognitive communication deficit: Secondary | ICD-10-CM | POA: Diagnosis not present

## 2023-12-29 DIAGNOSIS — R262 Difficulty in walking, not elsewhere classified: Secondary | ICD-10-CM | POA: Diagnosis not present

## 2023-12-29 DIAGNOSIS — R293 Abnormal posture: Secondary | ICD-10-CM

## 2023-12-29 DIAGNOSIS — R4701 Aphasia: Secondary | ICD-10-CM | POA: Diagnosis not present

## 2023-12-29 DIAGNOSIS — M5459 Other low back pain: Secondary | ICD-10-CM | POA: Diagnosis not present

## 2023-12-29 DIAGNOSIS — R471 Dysarthria and anarthria: Secondary | ICD-10-CM | POA: Diagnosis not present

## 2023-12-29 DIAGNOSIS — M6281 Muscle weakness (generalized): Secondary | ICD-10-CM

## 2023-12-29 DIAGNOSIS — R1312 Dysphagia, oropharyngeal phase: Secondary | ICD-10-CM | POA: Diagnosis not present

## 2023-12-29 DIAGNOSIS — F801 Expressive language disorder: Secondary | ICD-10-CM | POA: Diagnosis not present

## 2024-01-02 ENCOUNTER — Ambulatory Visit: Attending: Neurology | Admitting: Physical Therapy

## 2024-01-02 ENCOUNTER — Encounter: Payer: Self-pay | Admitting: Physical Therapy

## 2024-01-02 DIAGNOSIS — R1312 Dysphagia, oropharyngeal phase: Secondary | ICD-10-CM | POA: Insufficient documentation

## 2024-01-02 DIAGNOSIS — R471 Dysarthria and anarthria: Secondary | ICD-10-CM | POA: Diagnosis present

## 2024-01-02 DIAGNOSIS — M5459 Other low back pain: Secondary | ICD-10-CM | POA: Insufficient documentation

## 2024-01-02 DIAGNOSIS — R262 Difficulty in walking, not elsewhere classified: Secondary | ICD-10-CM | POA: Insufficient documentation

## 2024-01-02 DIAGNOSIS — R2681 Unsteadiness on feet: Secondary | ICD-10-CM | POA: Insufficient documentation

## 2024-01-02 DIAGNOSIS — R293 Abnormal posture: Secondary | ICD-10-CM | POA: Diagnosis present

## 2024-01-02 DIAGNOSIS — M6281 Muscle weakness (generalized): Secondary | ICD-10-CM | POA: Diagnosis present

## 2024-01-02 DIAGNOSIS — R41841 Cognitive communication deficit: Secondary | ICD-10-CM | POA: Diagnosis present

## 2024-01-03 ENCOUNTER — Ambulatory Visit: Payer: Self-pay

## 2024-01-05 ENCOUNTER — Ambulatory Visit: Admitting: Physical Therapy

## 2024-01-05 ENCOUNTER — Encounter: Payer: Self-pay | Admitting: Physical Therapy

## 2024-01-05 DIAGNOSIS — M6281 Muscle weakness (generalized): Secondary | ICD-10-CM

## 2024-01-05 DIAGNOSIS — R2681 Unsteadiness on feet: Secondary | ICD-10-CM

## 2024-01-05 DIAGNOSIS — R293 Abnormal posture: Secondary | ICD-10-CM

## 2024-01-05 NOTE — Therapy (Signed)
 OUTPATIENT PHYSICAL THERAPY NEURO TREATMENT/10th VISIT PROGRESS NOTE   Patient Name: Drew Andrews MRN: [id number] DOB:08-10-1945, 78 y.o., male Today's Date: 01/05/2024   PCP: Vernadine Charlie ORN, MD  REFERRING PROVIDER: Onita Duos, MD  Progress Note Reporting Period 12/06/2023 to 01/05/2024  See note below for Objective Data and Assessment of Progress/Goals.     END OF SESSION:  PT End of Session - 01/05/24 1023     Visit Number 10    Number of Visits 13    Date for PT Re-Evaluation 01/17/24    Authorization Type [employer]    PT Start Time 1021    PT Stop Time 1101    PT Time Calculation (min) 40 min    Equipment Utilized During Treatment Gait belt    Activity Tolerance Patient tolerated treatment well    Behavior During Therapy WFL for tasks assessed/performed              Past Medical History:  Diagnosis Date   Ascending aorta dilation (HCC) 09/14/2023   TTE 09/13/2023: EF 60-65, no RWMA, GR 1 DD, normal RVSF, trivial MR, moderate MAC, AVR with normal structure and function, mild dilation of ascending aorta (40 mm)    Bursitis of elbow 05/20/2013   Essential hypertension 03/12/2013   GERD (gastroesophageal reflux disease)    Hyperlipidemia    Hypertension    Left ventricular diastolic dysfunction, NYHA class 2 03/12/2013   LVH (left ventricular hypertrophy)    with aortic stenosis-bicuspid   PONV (postoperative nausea and vomiting)    as a child   S/P AVR (aortic valve replacement) 05/13/2013   S/P AVR, 05/13/13, 25 mm Edwards Magna-Ease pericardial valve 05/13/2013   Seasonal allergies    Severe aortic stenosis 03/12/2013   Past Surgical History:  Procedure Laterality Date   AORTIC VALVE REPLACEMENT N/A 05/13/2013   Procedure: AORTIC VALVE REPLACEMENT (AVR);  Surgeon: Dorise MARLA Fellers, MD;  Location: [facility] OR;  Service: Open Heart Surgery;  Laterality: N/A;   APPENDECTOMY  age 61   CARDIAC CATHETERIZATION     COLONOSCOPY     X 2   HIATAL  HERNIA REPAIR     INTRAOPERATIVE TRANSESOPHAGEAL ECHOCARDIOGRAM N/A 05/13/2013   Procedure: INTRAOPERATIVE TRANSESOPHAGEAL ECHOCARDIOGRAM;  Surgeon: Dorise MARLA Fellers, MD;  Location: [facility] OR;  Service: Open Heart Surgery;  Laterality: N/A;   LEFT HEART CATHETERIZATION WITH CORONARY ANGIOGRAM N/A 03/15/2013   Procedure: LEFT HEART CATHETERIZATION WITH CORONARY ANGIOGRAM;  Surgeon: Debby DELENA Sor, MD;  [facility] CATH LAB;  Service: Cardiovascular;  Laterality: N/A;   TONSILLECTOMY     TRANSTHORACIC ECHOCARDIOGRAM  03/04/2013   EF 55-60%, grade 2 diastolic dysfunction, AV with mod calcified leaflets & mild regurg, calcified MV annulus, LA mod dilated, RV mildly dilated   Patient Active Problem List   Diagnosis Date Noted   Ascending aorta dilation (HCC) 09/14/2023   CAD (coronary artery disease) 08/09/2023   Low back pain with sciatica 07/07/2023   Anxiety 04/17/2023   Cognitive impairment 01/12/2023   Parkinson's disease without dyskinesia or fluctuating manifestations (HCC) 01/12/2023   GERD (gastroesophageal reflux disease) 11/15/2015   Diplopia 02/21/2014   S/P AVR (aortic valve replacement) 07/13/2013   Rash of back, contact dermatitis resolved 05/31/2013   Bursitis of elbow 05/20/2013   S/P AVR, 05/13/13, 25 mm Edwards Magna-Ease pericardial valve 05/13/2013   Severe aortic stenosis 03/12/2013   Hyperlipidemia 03/12/2013   Essential hypertension 03/12/2013   Left ventricular diastolic dysfunction, NYHA class 2 03/12/2013  ONSET DATE: January 2025   REFERRING DIAG: R13.12 (ICD-10-CM) - Dysphagia, oropharyngeal phase F80.1 (ICD-10-CM) - Expressive language disorder R26.81 (ICD-10-CM) - Unsteadiness on feet  THERAPY DIAG:  Unsteadiness on feet  Muscle weakness (generalized)  Abnormal posture  Rationale for Evaluation and Treatment: Rehabilitation  SUBJECTIVE:                                                                                                                                                                                              SUBJECTIVE STATEMENT: Been doing the exercises at home some.  Feel pretty much the same.  Plan to see the neurosurgeon next week about the back.  Pt accompanied by: self   PERTINENT HISTORY: HTN, GERD, HLD, HTN, ventricular diastolic dysfunction  PAIN:  Are you having pain? Yes: NPRS scale: 2/10 Pain location: gut Pain description: mild, tight Aggravating factors: lying down Relieving factors: nothing  PRECAUTIONS: Fall  RED FLAGS: None   WEIGHT BEARING RESTRICTIONS: No  FALLS: Has patient fallen in last 6 months? No  LIVING ENVIRONMENT: Lives with: lives with their spouse Lives in: House/apartment Stairs: 4-5 steps to enter with rail; 2 steps in the back with a handle; 2 story home Has following equipment at home: Vannie - 4 wheeled, Grab bars, and walking poles   PLOF: Independent; pt used to ride his bike  PATIENT GOALS: per wife- get pt more active and improve standing tolerance   OBJECTIVE:    TODAY'S TREATMENT: 01/05/2024 Activity Comments  6 MWT:  1373 ft Pre-test vitals: 150/91 HR 76 bpm, 98% O2 Post test vitals: 96% O2, 107/59 HR 62 bpm Indoor/outdoor surfaces  7-8/10RPE  Review of HEP: Step ups Step ups with knee to chest Good return demo; cues for slowed pace/full foot placement  Sit to stand x10, holding 8# weight Cues for upright posture, glut/quad activaiton  Sit to stand, standing on Airex x 10 Cues for posture, glut/quad activation           TODAY'S TREATMENT: 01/02/24 Activity Comments  TM walking 1.5 mph x 4 min Cueing for long rather than fast steps, upright posture, TKE in swing   alt step ups with 10# 2x1 min Pt reports unsteadiness. Trunk and knees flexed. Use of counting out loud to improve reciprocal stepping consistency   step up + opposite SKTC 10x  Weaned to 1 UE support; cues to reset posture mid-way through   4 square step  Instability; CGA, cueing for safe foot  placement          PATIENT EDUCATION: Education details: 01/05/24 Progress towards goals, POC and continueing to be consistent  with HEP. Person educated: Patient Education method: Explanation, Demonstration, Tactile cues, Verbal cues, and Handouts Education comprehension: verbalized understanding and returned demonstration    HOME EXERCISE PROGRAM: Access Code: [email or web address] URL: [email or web address] Date: 01/02/2024 Prepared by: [facility] - Outpatient  Rehab - [facility]  Program Notes perform stair activities with handrail  Exercises - Seated Lumbar Flexion Stretch  - 1 x daily - 7 x weekly - 1 sets - 10 reps - deep breaths hold - Hooklying Single Knee to Chest  - 1 x daily - 7 x weekly - 3 sets - 10 deep breaths hold - Supine Double Knee to Chest  - 1 x daily - 7 x weekly - 3 sets - 10 deep breaths hold - Staggered Stance Forward Backward Weight Shift with Counter Support  - 1 x daily - 7 x weekly - 1-2 sets - 10 reps - Step Up  - 1 x daily - 5 x weekly - 2-3 sets - 1 min hold - Runner's Step Up/Down  - 1 x daily - 5 x weekly - 2 sets - 10 reps     --------------------------------------------------------- Note: Objective measures were completed at Evaluation unless otherwise noted.  DIAGNOSTIC FINDINGS: lumbar xray 07/07/23: Multilevel degenerative disc disease and facet disease.   COGNITION: Overall cognitive status: Within functional limits for tasks assessed   SENSATION: Pt denies N/T in UEs/LEs   COORDINATION: Alternating pronation/supination: B dysdiadochokinesia Alternating toe tap: WFL B Finger to nose: WFL B  MUSCLE TONE: 2 beats of clonus in L ankle  POSTURE: rounded shoulders, forward head, flexed trunk , and slight L trunk lean  LOWER EXTREMITY ROM:     Active  Right Eval Left Eval  Hip flexion    Hip extension    Hip abduction    Hip adduction    Hip internal rotation    Hip external rotation    Knee flexion    Knee extension     Ankle dorsiflexion 10 deg with knee slightly flexed 4 deg with knee slightly flexed  Ankle plantarflexion    Ankle inversion    Ankle eversion     (Blank rows = not tested)   Vitals checked d/t pt reporting mild SOB from assessment thus far: 105/74 mmHg, 65 bpm, 98% spO2   LOWER EXTREMITY MMT:    MMT *in sitting  Right Eval Left Eval  Hip flexion 4+ 4+  Hip extension    Hip abduction 4 4  Hip adduction 4 4  Hip internal rotation    Hip external rotation    Knee flexion 5 5  Knee extension 5 5  Ankle dorsiflexion 5 5  Ankle plantarflexion 5 5  Ankle inversion    Ankle eversion    (Blank rows = not tested)  GAIT: Findings: Assistive device utilized:None, Level of assistance: Modified independence, and Comments: trunk flexed forward and to L; quick speed but steps are short and discontinuous   FUNCTIONAL TESTS:  5 times sit to stand: 14.74 sec without UE support and cues to stand up fully; 97% spO2                                                                          TREATMENT DATE:  12/07/23      GOALS: Goals reviewed with patient? Yes  SHORT TERM GOALS: Target date: 12/27/2023  Patient to be independent with initial HEP. Baseline: HEP initiated Goal status: MET    LONG TERM GOALS: Target date: 01/17/2024  Patient to be independent with advanced HEP. Baseline: Not yet initiated  Goal status: IN PROGRESS  Patient to return to walking for exercise for at least 15 minutes at a time without LBP or SOB limiting.  Baseline: 01/05/24:  10 minutes Goal status: IN PROGRESS  Patient to score at least 22/30 on FGA in order to decrease risk of falls.   Baseline: 20/30 Goal status: IN PROGRESS  Patient to demonstrate STS with 8 out of 10 reps without retropulsion and with upright posture.  Baseline: tendency not to stand fully upright Goal status: IN PROGRESS  Demo improved endurance and gait speed per distance 1,035 ft during Baseline: 765 ft RPE 8/10>  1337 ft RPE 7-8/10 Goal status: MET 01/05/2024  Patient to report tolerance for 30 minutes of standing without LBP limiting in order to prepare a meal.  Baseline: 5 minutes, prior to back pain, 01/05/2024 Goal status: IN PROGRESS    ASSESSMENT:  CLINICAL IMPRESSION: 10th Visit PN:  Pt presents today with no new complaints. Skilled PT session focused on assessing 6 MWT and working on functional strength/balance.  He has improved distance with 6 MWT (by almost double!) to 1335 ft on outdoor surfaces. He has met the LTG for 6 MWT.  He does well with sit to stand activities, with cues for full activation through gluts and quads.  He is able to fully extend knees in standing with cues.   Pt will continue to benefit from skilled PT towards goals for improved functional mobility and decreased fall risk.   OBJECTIVE IMPAIRMENTS: Abnormal gait, cardiopulmonary status limiting activity, decreased activity tolerance, decreased balance, decreased coordination, decreased endurance, decreased knowledge of use of DME, difficulty walking, decreased ROM, decreased strength, dizziness, impaired flexibility, impaired tone, improper body mechanics, postural dysfunction, and pain.   ACTIVITY LIMITATIONS: carrying, lifting, bending, sitting, standing, squatting, stairs, transfers, bathing, toileting, dressing, reach over head, hygiene/grooming, locomotion level, and caring for others  PARTICIPATION LIMITATIONS: meal prep, cleaning, laundry, shopping, community activity, yard work, and church  PERSONAL FACTORS: Age, Past/current experiences, Time since onset of injury/illness/exacerbation, and 3+ comorbidities: HTN, GERD, HLD, HTN, ventricular diastolic dysfunction are also affecting patient's functional outcome.   REHAB POTENTIAL: Good  CLINICAL DECISION MAKING: Evolving/moderate complexity  EVALUATION COMPLEXITY: Moderate  PLAN:  PT FREQUENCY: 1-2x/week  PT DURATION: 6 weeks  PLANNED INTERVENTIONS: [id number]-  PT Re-evaluation, 97750- Physical Performance Testing, 97110-Therapeutic exercises, 97530- Therapeutic activity, [id number]- Neuromuscular re-education, 97535- Self Care, [id number]- Manual therapy, [id number]- Gait training, [phone]- Canalith repositioning, [id number]- Aquatic Therapy, [phone] (1-2 muscles), [id number] (3+ muscles)- Dry Needling, Patient/Family education, Balance training, Stair training, Taping, Vestibular training, DME instructions, Cryotherapy, and Moist heat  PLAN FOR NEXT SESSION:   Check LTGs and discuss POC (likely plan for discharge).  Consider adding balance/compliant surface into HEP (update/consolidate HEP)  Try resisted gait activities at [facility] Progress for hip and core strengthening, posture, balance    Greig Anon, PT 01/05/24 12:03 PM Phone: [phone] Fax: [phone]   [facility] at [facility] [address], [address] [place], [place] [address] Phone # [phone] Fax # [phone]

## 2024-01-09 ENCOUNTER — Ambulatory Visit: Admitting: Physical Therapy

## 2024-01-09 ENCOUNTER — Encounter: Payer: Self-pay | Admitting: Physical Therapy

## 2024-01-09 DIAGNOSIS — M6281 Muscle weakness (generalized): Secondary | ICD-10-CM

## 2024-01-09 DIAGNOSIS — R2681 Unsteadiness on feet: Secondary | ICD-10-CM | POA: Diagnosis not present

## 2024-01-09 DIAGNOSIS — M5416 Radiculopathy, lumbar region: Secondary | ICD-10-CM | POA: Diagnosis not present

## 2024-01-09 DIAGNOSIS — M4316 Spondylolisthesis, lumbar region: Secondary | ICD-10-CM | POA: Diagnosis not present

## 2024-01-09 DIAGNOSIS — R293 Abnormal posture: Secondary | ICD-10-CM

## 2024-01-09 NOTE — Therapy (Signed)
 OUTPATIENT PHYSICAL THERAPY NEURO TREATMENT   Patient Name: Drew Andrews MRN: [id number] DOB:11/12/1945, 78 y.o., male Today's Date: 01/09/2024   PCP: Vernadine Charlie ORN, MD  REFERRING PROVIDER: Onita Duos, MD    END OF SESSION:  PT End of Session - 01/09/24 0937     Visit Number 11    Number of Visits 13    Date for PT Re-Evaluation 01/17/24    Authorization Type [employer]    PT Start Time 0935    PT Stop Time 1013    PT Time Calculation (min) 38 min    Equipment Utilized During Treatment Gait belt    Activity Tolerance Patient tolerated treatment well    Behavior During Therapy WFL for tasks assessed/performed               Past Medical History:  Diagnosis Date   Ascending aorta dilation (HCC) 09/14/2023   TTE 09/13/2023: EF 60-65, no RWMA, GR 1 DD, normal RVSF, trivial MR, moderate MAC, AVR with normal structure and function, mild dilation of ascending aorta (40 mm)    Bursitis of elbow 05/20/2013   Essential hypertension 03/12/2013   GERD (gastroesophageal reflux disease)    Hyperlipidemia    Hypertension    Left ventricular diastolic dysfunction, NYHA class 2 03/12/2013   LVH (left ventricular hypertrophy)    with aortic stenosis-bicuspid   PONV (postoperative nausea and vomiting)    as a child   S/P AVR (aortic valve replacement) 05/13/2013   S/P AVR, 05/13/13, 25 mm Edwards Magna-Ease pericardial valve 05/13/2013   Seasonal allergies    Severe aortic stenosis 03/12/2013   Past Surgical History:  Procedure Laterality Date   AORTIC VALVE REPLACEMENT N/A 05/13/2013   Procedure: AORTIC VALVE REPLACEMENT (AVR);  Surgeon: Dorise MARLA Fellers, MD;  Location: [facility] OR;  Service: Open Heart Surgery;  Laterality: N/A;   APPENDECTOMY  age 70   CARDIAC CATHETERIZATION     COLONOSCOPY     X 2   HIATAL HERNIA REPAIR     INTRAOPERATIVE TRANSESOPHAGEAL ECHOCARDIOGRAM N/A 05/13/2013   Procedure: INTRAOPERATIVE TRANSESOPHAGEAL ECHOCARDIOGRAM;  Surgeon: Dorise MARLA Fellers, MD;  Location: [facility] OR;  Service: Open Heart Surgery;  Laterality: N/A;   LEFT HEART CATHETERIZATION WITH CORONARY ANGIOGRAM N/A 03/15/2013   Procedure: LEFT HEART CATHETERIZATION WITH CORONARY ANGIOGRAM;  Surgeon: Debby DELENA Sor, MD;  Location: [facility] CATH LAB;  Service: Cardiovascular;  Laterality: N/A;   TONSILLECTOMY     TRANSTHORACIC ECHOCARDIOGRAM  03/04/2013   EF 55-60%, grade 2 diastolic dysfunction, AV with mod calcified leaflets & mild regurg, calcified MV annulus, LA mod dilated, RV mildly dilated   Patient Active Problem List   Diagnosis Date Noted   Ascending aorta dilation (HCC) 09/14/2023   CAD (coronary artery disease) 08/09/2023   Low back pain with sciatica 07/07/2023   Anxiety 04/17/2023   Cognitive impairment 01/12/2023   Parkinson's disease without dyskinesia or fluctuating manifestations (HCC) 01/12/2023   GERD (gastroesophageal reflux disease) 11/15/2015   Diplopia 02/21/2014   S/P AVR (aortic valve replacement) 07/13/2013   Rash of back, contact dermatitis resolved 05/31/2013   Bursitis of elbow 05/20/2013   S/P AVR, 05/13/13, 25 mm Edwards Magna-Ease pericardial valve 05/13/2013   Severe aortic stenosis 03/12/2013   Hyperlipidemia 03/12/2013   Essential hypertension 03/12/2013   Left ventricular diastolic dysfunction, NYHA class 2 03/12/2013    ONSET DATE: January 2025   REFERRING DIAG: R13.12 (ICD-10-CM) - Dysphagia, oropharyngeal phase F80.1 (ICD-10-CM) - Expressive language disorder R26.81 (ICD-10-CM) -  Unsteadiness on feet  THERAPY DIAG:  Muscle weakness (generalized)  Abnormal posture  Unsteadiness on feet  Rationale for Evaluation and Treatment: Rehabilitation  SUBJECTIVE:                                                                                                                                                                                             SUBJECTIVE STATEMENT: Just moving a little slower today-not sure why.  Pt  accompanied by: self   PERTINENT HISTORY: HTN, GERD, HLD, HTN, ventricular diastolic dysfunction  PAIN:  Are you having pain? Yes: NPRS scale: 2/10 Pain location: gut Pain description: mild, tight Aggravating factors: lying down Relieving factors: nothing  PRECAUTIONS: Fall  RED FLAGS: None   WEIGHT BEARING RESTRICTIONS: No  FALLS: Has patient fallen in last 6 months? No  LIVING ENVIRONMENT: Lives with: lives with their spouse Lives in: House/apartment Stairs: 4-5 steps to enter with rail; 2 steps in the back with a handle; 2 story home Has following equipment at home: Vannie - 4 wheeled, Grab bars, and walking poles   PLOF: Independent; pt used to ride his bike  PATIENT GOALS: per wife- get pt more active and improve standing tolerance   OBJECTIVE:    TODAY'S TREATMENT: 01/09/2024 Activity Comments  Forward/back walking at counter Sidestep at counter 1 min each, warm up  Step strategy work: Forward x 10 Side x 10 Back x 10 Cues for intentional step and recover  Balance at counter:  feet apart with EC head turns/nods Feet partial tandem with EO head turns/nods, EC head steady x 30 sec Able to wean away from UE support Patient reports some pain in chest and in jaw Cues for upright posture and pain continues  Vitals: 100/72, HR 71 Pain has subsided            HOME EXERCISE PROGRAM: Access Code: [email or web address] URL: [email or web address] Date: 01/09/2024 Prepared by: [facility] - Outpatient  Rehab - [facility]  Program Notes perform stair activities with handrail  Exercises - Seated Lumbar Flexion Stretch  - 1 x daily - 7 x weekly - 1 sets - 10 reps - deep breaths hold - Hooklying Single Knee to Chest  - 1 x daily - 7 x weekly - 3 sets - 10 deep breaths hold - Supine Double Knee to Chest  - 1 x daily - 7 x weekly - 3 sets - 10 deep breaths hold - Staggered Stance Forward Backward Weight Shift with Counter Support  - 1 x daily - 7 x weekly -  1-2 sets - 10 reps - Step Up  - 1 x daily -  5 x weekly - 2-3 sets - 1 min hold - Runner's Step Up/Down  - 1 x daily - 5 x weekly - 2 sets - 10 reps - Side Stepping with Counter Support  - 1 x daily - 5 x weekly - 2 sets - 10 reps - Alternating Step forward-Balance Reaction  - 1 x daily - 5 x weekly - 2 sets - 10 reps - Alternating Step Backward with Support  - 1 x daily - 7 x weekly - 2 sets - 10 reps    PATIENT EDUCATION: Education details: 01/09/24:  Discussed signs/symptoms of MI versus muscle tightness and postural tightness (pt has recently had cardiac stress test that was [facility], per report).  Discussed PD symptoms in relation to gastric pain/issues; additions to HEP Person educated: Patient Education method: Explanation, Demonstration, Tactile cues, Verbal cues, and Handouts Education comprehension: verbalized understanding and returned demonstration        --------------------------------------------------------- Note: Objective measures were completed at Evaluation unless otherwise noted.  DIAGNOSTIC FINDINGS: lumbar xray 07/07/23: Multilevel degenerative disc disease and facet disease.   COGNITION: Overall cognitive status: Within functional limits for tasks assessed   SENSATION: Pt denies N/T in UEs/LEs   COORDINATION: Alternating pronation/supination: B dysdiadochokinesia Alternating toe tap: WFL B Finger to nose: WFL B  MUSCLE TONE: 2 beats of clonus in L ankle  POSTURE: rounded shoulders, forward head, flexed trunk , and slight L trunk lean  LOWER EXTREMITY ROM:     Active  Right Eval Left Eval  Hip flexion    Hip extension    Hip abduction    Hip adduction    Hip internal rotation    Hip external rotation    Knee flexion    Knee extension    Ankle dorsiflexion 10 deg with knee slightly flexed 4 deg with knee slightly flexed  Ankle plantarflexion    Ankle inversion    Ankle eversion     (Blank rows = not tested)   Vitals checked d/t pt reporting  mild SOB from assessment thus far: 105/74 mmHg, 65 bpm, 98% spO2   LOWER EXTREMITY MMT:    MMT *in sitting  Right Eval Left Eval  Hip flexion 4+ 4+  Hip extension    Hip abduction 4 4  Hip adduction 4 4  Hip internal rotation    Hip external rotation    Knee flexion 5 5  Knee extension 5 5  Ankle dorsiflexion 5 5  Ankle plantarflexion 5 5  Ankle inversion    Ankle eversion    (Blank rows = not tested)  GAIT: Findings: Assistive device utilized:None, Level of assistance: Modified independence, and Comments: trunk flexed forward and to L; quick speed but steps are short and discontinuous   FUNCTIONAL TESTS:  5 times sit to stand: 14.74 sec without UE support and cues to stand up fully; 97% spO2                                                                          TREATMENT DATE: 12/07/23      GOALS: Goals reviewed with patient? Yes  SHORT TERM GOALS: Target date: 12/27/2023  Patient to be independent with initial HEP. Baseline: HEP initiated Goal status:  MET    LONG TERM GOALS: Target date: 01/17/2024  Patient to be independent with advanced HEP. Baseline: Not yet initiated  Goal status: IN PROGRESS  Patient to return to walking for exercise for at least 15 minutes at a time without LBP or SOB limiting.  Baseline: 01/05/24:  10 minutes Goal status: IN PROGRESS  Patient to score at least 22/30 on FGA in order to decrease risk of falls.   Baseline: 20/30 Goal status: IN PROGRESS  Patient to demonstrate STS with 8 out of 10 reps without retropulsion and with upright posture.  Baseline: tendency not to stand fully upright Goal status: IN PROGRESS  Demo improved endurance and gait speed per distance 1,035 ft during Baseline: 765 ft RPE 8/10> 1337 ft RPE 7-8/10 Goal status: MET 01/05/2024  Patient to report tolerance for 30 minutes of standing without LBP limiting in order to prepare a meal.  Baseline: 5 minutes, prior to back pain, 01/05/2024 Goal status: IN  PROGRESS    ASSESSMENT:  CLINICAL IMPRESSION: Pt presents today and reports generally not feeling the best today.  Skilled PT session focused on review and progression of HEP, especially working on balance strategy and multi-sensory balance today.  While performing balance exercises, he does have more forward posture, more tremors and unsteadiness, then he has c/o chest tightness and jaw pain (pt has c/o intermittent pain like this in the past and is currently being followed by cardiologist). Vitals checked and are [facility].  Provided HEP additions and discussed forward posture, muscle tightness and ways to improve attention to postural attention.   He has no complaints upon leaving session today.    OBJECTIVE IMPAIRMENTS: Abnormal gait, cardiopulmonary status limiting activity, decreased activity tolerance, decreased balance, decreased coordination, decreased endurance, decreased knowledge of use of DME, difficulty walking, decreased ROM, decreased strength, dizziness, impaired flexibility, impaired tone, improper body mechanics, postural dysfunction, and pain.   ACTIVITY LIMITATIONS: carrying, lifting, bending, sitting, standing, squatting, stairs, transfers, bathing, toileting, dressing, reach over head, hygiene/grooming, locomotion level, and caring for others  PARTICIPATION LIMITATIONS: meal prep, cleaning, laundry, shopping, community activity, yard work, and church  PERSONAL FACTORS: Age, Past/current experiences, Time since onset of injury/illness/exacerbation, and 3+ comorbidities: HTN, GERD, HLD, HTN, ventricular diastolic dysfunction are also affecting patient's functional outcome.   REHAB POTENTIAL: Good  CLINICAL DECISION MAKING: Evolving/moderate complexity  EVALUATION COMPLEXITY: Moderate  PLAN:  PT FREQUENCY: 1-2x/week  PT DURATION: 6 weeks  PLANNED INTERVENTIONS: 97164- PT Re-evaluation, 97750- Physical Performance Testing, 97110-Therapeutic exercises, 97530- Therapeutic  activity, [id number]- Neuromuscular re-education, 97535- Self Care, [id number]- Manual therapy, [id number]- Gait training, [phone]- Canalith repositioning, [id number]- Aquatic Therapy, [phone] (1-2 muscles), [id number] (3+ muscles)- Dry Needling, Patient/Family education, Balance training, Stair training, Taping, Vestibular training, DME instructions, Cryotherapy, and Moist heat  PLAN FOR NEXT SESSION:   Check LTGs and discuss POC (likely plan for discharge).     Greig Anon, PT 01/09/24 11:31 AM Phone: [phone] Fax: [phone]   [facility] at [facility] [address] [place], [address] [place], [place] [address] Phone # [phone] Fax # [phone]

## 2024-01-12 ENCOUNTER — Encounter: Payer: Self-pay | Admitting: Physical Therapy

## 2024-01-12 ENCOUNTER — Ambulatory Visit: Admitting: Physical Therapy

## 2024-01-12 DIAGNOSIS — R2681 Unsteadiness on feet: Secondary | ICD-10-CM

## 2024-01-12 DIAGNOSIS — R293 Abnormal posture: Secondary | ICD-10-CM

## 2024-01-12 DIAGNOSIS — M6281 Muscle weakness (generalized): Secondary | ICD-10-CM

## 2024-01-12 NOTE — Therapy (Signed)
 OUTPATIENT PHYSICAL THERAPY NEURO TREATMENT/DISCHARGE SUMMARY   Patient Name: Drew Andrews MRN: [id number] DOB:[birth year], 78 y.o., male Today's Date: 01/12/2024   PCP: Vernadine Charlie ORN, MD  REFERRING PROVIDER: Onita Duos, MD   PHYSICAL THERAPY DISCHARGE SUMMARY  Visits from Start of Care: 12  Current functional level related to goals / functional outcomes: See goals below   Remaining deficits: Back pain (being addressed by neurosurgeon) Balance-improving   Education / Equipment: Educated in HEP progression, PD symposium   Patient agrees to discharge. Patient goals were partially met. Patient is being discharged due to being pleased with the current functional level.  Recommend return PD screens in 6-9 months due to progressive nature of disease process.  END OF SESSION:  PT End of Session - 01/12/24 0938     Visit Number 12    Number of Visits 13    Date for PT Re-Evaluation 01/17/24    Authorization Type [employer]    PT Start Time [id number]    PT Stop Time 1014    PT Time Calculation (min) 38 min    Equipment Utilized During Treatment --    Activity Tolerance Patient tolerated treatment well    Behavior During Therapy WFL for tasks assessed/performed                Past Medical History:  Diagnosis Date   Ascending aorta dilation (HCC) 09/14/2023   TTE 09/13/2023: EF 60-65, no RWMA, GR 1 DD, normal RVSF, trivial MR, moderate MAC, AVR with normal structure and function, mild dilation of ascending aorta (40 mm)    Bursitis of elbow 05/20/2013   Essential hypertension 03/12/2013   GERD (gastroesophageal reflux disease)    Hyperlipidemia    Hypertension    Left ventricular diastolic dysfunction, NYHA class 2 03/12/2013   LVH (left ventricular hypertrophy)    with aortic stenosis-bicuspid   PONV (postoperative nausea and vomiting)    as a child   S/P AVR (aortic valve replacement) 05/13/2013   S/P AVR, 05/13/13, 25 mm Edwards Magna-Ease  pericardial valve 05/13/2013   Seasonal allergies    Severe aortic stenosis 03/12/2013   Past Surgical History:  Procedure Laterality Date   AORTIC VALVE REPLACEMENT N/A 05/13/2013   Procedure: AORTIC VALVE REPLACEMENT (AVR);  Surgeon: Dorise MARLA Fellers, MD;  Location: [facility] OR;  Service: Open Heart Surgery;  Laterality: N/A;   APPENDECTOMY  age 31   CARDIAC CATHETERIZATION     COLONOSCOPY     X 2   HIATAL HERNIA REPAIR     INTRAOPERATIVE TRANSESOPHAGEAL ECHOCARDIOGRAM N/A 05/13/2013   Procedure: INTRAOPERATIVE TRANSESOPHAGEAL ECHOCARDIOGRAM;  Surgeon: Dorise MARLA Fellers, MD;  Location: [facility] OR;  Service: Open Heart Surgery;  Laterality: N/A;   LEFT HEART CATHETERIZATION WITH CORONARY ANGIOGRAM N/A 03/15/2013   Procedure: LEFT HEART CATHETERIZATION WITH CORONARY ANGIOGRAM;  Surgeon: Debby DELENA Sor, MD;  Location: [facility] CATH LAB;  Service: Cardiovascular;  Laterality: N/A;   TONSILLECTOMY     TRANSTHORACIC ECHOCARDIOGRAM  03/04/2013   EF 55-60%, grade 2 diastolic dysfunction, AV with mod calcified leaflets & mild regurg, calcified MV annulus, LA mod dilated, RV mildly dilated   Patient Active Problem List   Diagnosis Date Noted   Ascending aorta dilation (HCC) 09/14/2023   CAD (coronary artery disease) 08/09/2023   Low back pain with sciatica 07/07/2023   Anxiety 04/17/2023   Cognitive impairment 01/12/2023   Parkinson's disease without dyskinesia or fluctuating manifestations (HCC) 01/12/2023   GERD (gastroesophageal reflux disease) 11/15/2015   Diplopia  02/21/2014   S/P AVR (aortic valve replacement) 07/13/2013   Rash of back, contact dermatitis resolved 05/31/2013   Bursitis of elbow 05/20/2013   S/P AVR, 05/13/13, 25 mm Edwards Magna-Ease pericardial valve 05/13/2013   Severe aortic stenosis 03/12/2013   Hyperlipidemia 03/12/2013   Essential hypertension 03/12/2013   Left ventricular diastolic dysfunction, NYHA class 2 03/12/2013    ONSET DATE: January 2025   REFERRING DIAG: R13.12  (ICD-10-CM) - Dysphagia, oropharyngeal phase F80.1 (ICD-10-CM) - Expressive language disorder R26.81 (ICD-10-CM) - Unsteadiness on feet  THERAPY DIAG:  Abnormal posture  Unsteadiness on feet  Muscle weakness (generalized)  Rationale for Evaluation and Treatment: Rehabilitation  SUBJECTIVE:                                                                                                                                                                                             SUBJECTIVE STATEMENT: Think that I may have to have surgery for my back at some point.    Pt accompanied by: self   PERTINENT HISTORY: HTN, GERD, HLD, HTN, ventricular diastolic dysfunction  PAIN:  Are you having pain? Yes: NPRS scale: 1/10 Pain location: gut Pain description: mild, tight Aggravating factors: lying down Relieving factors: nothing  PRECAUTIONS: Fall  RED FLAGS: None   WEIGHT BEARING RESTRICTIONS: No  FALLS: Has patient fallen in last 6 months? No  LIVING ENVIRONMENT: Lives with: lives with their spouse Lives in: House/apartment Stairs: 4-5 steps to enter with rail; 2 steps in the back with a handle; 2 story home Has following equipment at home: Vannie - 4 wheeled, Grab bars, and walking poles   PLOF: Independent; pt used to ride his bike  PATIENT GOALS: per wife- get pt more active and improve standing tolerance   OBJECTIVE:    TODAY'S TREATMENT: 01/12/2024 Activity Comments  Review of full HEP Good return demo with min cues  FTSTS:  15.56 sec 10 of 10 reps of sit to stand, no retropulsion   TUG:  10.87 sec   10 M walk: 11 sec   FGA 22/30 Improved from 20/30      HOME EXERCISE PROGRAM: Access Code: [email or web address] URL: [email or web address] Date: 01/09/2024 Prepared by: [facility] - Outpatient  Rehab - [facility]  Program Notes perform stair activities with handrail  Exercises - Seated Lumbar Flexion Stretch  - 1 x daily - 7 x weekly - 1 sets -  10 reps - deep breaths hold - Hooklying Single Knee to Chest  - 1 x daily - 7 x weekly - 3 sets - 10 deep breaths hold - Supine Double Knee to Chest  -  1 x daily - 7 x weekly - 3 sets - 10 deep breaths hold - Staggered Stance Forward Backward Weight Shift with Counter Support  - 1 x daily - 7 x weekly - 1-2 sets - 10 reps - Step Up  - 1 x daily - 5 x weekly - 2-3 sets - 1 min hold - Runner's Step Up/Down  - 1 x daily - 5 x weekly - 2 sets - 10 reps - Side Stepping with Counter Support  - 1 x daily - 5 x weekly - 2 sets - 10 reps - Alternating Step forward-Balance Reaction  - 1 x daily - 5 x weekly - 2 sets - 10 reps - Alternating Step Backward with Support  - 1 x daily - 7 x weekly - 2 sets - 10 reps    PATIENT EDUCATION: Education details: Provided information on Aware in Care Kit through [facility]; review of HEP, POC and plans for d/c this visit; plans for return screen Person educated: Patient Education method: Explanation, Demonstration, Tactile cues, Verbal cues, and Handouts Education comprehension: verbalized understanding and returned demonstration        --------------------------------------------------------- Note: Objective measures were completed at Evaluation unless otherwise noted.  DIAGNOSTIC FINDINGS: lumbar xray 07/07/23: Multilevel degenerative disc disease and facet disease.   COGNITION: Overall cognitive status: Within functional limits for tasks assessed   SENSATION: Pt denies N/T in UEs/LEs   COORDINATION: Alternating pronation/supination: B dysdiadochokinesia Alternating toe tap: WFL B Finger to nose: WFL B  MUSCLE TONE: 2 beats of clonus in L ankle  POSTURE: rounded shoulders, forward head, flexed trunk , and slight L trunk lean  LOWER EXTREMITY ROM:     Active  Right Eval Left Eval  Hip flexion    Hip extension    Hip abduction    Hip adduction    Hip internal rotation    Hip external rotation    Knee flexion    Knee extension     Ankle dorsiflexion 10 deg with knee slightly flexed 4 deg with knee slightly flexed  Ankle plantarflexion    Ankle inversion    Ankle eversion     (Blank rows = not tested)   Vitals checked d/t pt reporting mild SOB from assessment thus far: 105/74 mmHg, 65 bpm, 98% spO2   LOWER EXTREMITY MMT:    MMT *in sitting  Right Eval Left Eval  Hip flexion 4+ 4+  Hip extension    Hip abduction 4 4  Hip adduction 4 4  Hip internal rotation    Hip external rotation    Knee flexion 5 5  Knee extension 5 5  Ankle dorsiflexion 5 5  Ankle plantarflexion 5 5  Ankle inversion    Ankle eversion    (Blank rows = not tested)  GAIT: Findings: Assistive device utilized:None, Level of assistance: Modified independence, and Comments: trunk flexed forward and to L; quick speed but steps are short and discontinuous   FUNCTIONAL TESTS:  5 times sit to stand: 14.74 sec without UE support and cues to stand up fully; 97% spO2  TREATMENT DATE: 12/07/23      GOALS: Goals reviewed with patient? Yes  SHORT TERM GOALS: Target date: 12/27/2023  Patient to be independent with initial HEP. Baseline: HEP initiated Goal status: MET    LONG TERM GOALS: Target date: 01/17/2024  Patient to be independent with advanced HEP. Baseline: Not yet initiated; good return demo 01/12/2024 Goal status: MET9/04/2024  Patient to return to walking for exercise for at least 15 minutes at a time without LBP or SOB limiting.  Baseline: 01/05/24:  10 minutes Goal status: NOT MET 01/12/2024  Patient to score at least 22/30 on FGA in order to decrease risk of falls.   Baseline: 20/30> 22/30 01/12/2024 Goal status: MET 01/12/2024  Patient to demonstrate STS with 8 out of 10 reps without retropulsion and with upright posture.  Baseline:  Goal status: MET 01/12/2024  Demo improved endurance and gait speed per distance 1,035 ft during Baseline:  765 ft RPE 8/10> 1337 ft RPE 7-8/10 Goal status: MET 01/05/2024  Patient to report tolerance for 30 minutes of standing without LBP limiting in order to prepare a meal.  Baseline: 5 minutes, prior to back pain, 01/05/2024 Goal status: NOT MET 01/12/2024    ASSESSMENT:  CLINICAL IMPRESSION: Pt presents today with reports that he may be looking at back surgery, per recent neurosurgeon visit. Skilled PT session focused on assessing goals, with pt meeting 4 of 6 LTGs. Prolonged standing and gait goals continue to be limited due to his back pain (which is being followed by neurosurgeon, with potential for upcoming surgery).  Provided pt with full review of HEP and pt return demo understanding.  He has improved balance in FGA and transfers with no episodes of retropulsion.  He is appropriate for discharge at this time.    OBJECTIVE IMPAIRMENTS: Abnormal gait, cardiopulmonary status limiting activity, decreased activity tolerance, decreased balance, decreased coordination, decreased endurance, decreased knowledge of use of DME, difficulty walking, decreased ROM, decreased strength, dizziness, impaired flexibility, impaired tone, improper body mechanics, postural dysfunction, and pain.   ACTIVITY LIMITATIONS: carrying, lifting, bending, sitting, standing, squatting, stairs, transfers, bathing, toileting, dressing, reach over head, hygiene/grooming, locomotion level, and caring for others  PARTICIPATION LIMITATIONS: meal prep, cleaning, laundry, shopping, community activity, yard work, and church  PERSONAL FACTORS: Age, Past/current experiences, Time since onset of injury/illness/exacerbation, and 3+ comorbidities: HTN, GERD, HLD, HTN, ventricular diastolic dysfunction are also affecting patient's functional outcome.   REHAB POTENTIAL: Good  CLINICAL DECISION MAKING: Evolving/moderate complexity  EVALUATION COMPLEXITY: Moderate  PLAN:  PT FREQUENCY: 1-2x/week  PT DURATION: 6 weeks  PLANNED  INTERVENTIONS: 97164- PT Re-evaluation, 97750- Physical Performance Testing, 97110-Therapeutic exercises, 97530- Therapeutic activity, [id number]- Neuromuscular re-education, 97535- Self Care, 02859- Manual therapy, [id number]- Gait training, [phone]- Canalith repositioning, [id number]- Aquatic Therapy, [phone] (1-2 muscles), 20561 (3+ muscles)- Dry Needling, Patient/Family education, Balance training, Stair training, Taping, Vestibular training, DME instructions, Cryotherapy, and Moist heat  PLAN FOR NEXT SESSION:   Discharge this visit.  Plan for screens in 6-9 months.     Greig Anon, PT 01/12/24 12:24 PM Phone: [phone] Fax: [phone]   [facility] at [facility] [address] [place], [address] [place], [place] [address] Phone # [phone] Fax # [phone]

## 2024-01-15 ENCOUNTER — Ambulatory Visit

## 2024-01-15 DIAGNOSIS — R471 Dysarthria and anarthria: Secondary | ICD-10-CM

## 2024-01-15 DIAGNOSIS — R1312 Dysphagia, oropharyngeal phase: Secondary | ICD-10-CM

## 2024-01-15 DIAGNOSIS — R2681 Unsteadiness on feet: Secondary | ICD-10-CM | POA: Diagnosis not present

## 2024-01-15 DIAGNOSIS — R41841 Cognitive communication deficit: Secondary | ICD-10-CM

## 2024-01-15 NOTE — Therapy (Signed)
 OUTPATIENT SPEECH LANGUAGE PATHOLOGY [facility]'S TREATMENT   Patient Name: Drew Andrews MRN: [id number] DOB:[birth year], 78 y.o., male Today's Date: 01/15/2024  PCP: Drew Ade, MD REFERRING PROVIDER: Onita Duos, MD  END OF SESSION:  End of Session - 01/15/24 1503     Visit Number 2    Number of Visits 17    Date for SLP Re-Evaluation 03/06/24    SLP Start Time 1233    SLP Stop Time  1315    SLP Time Calculation (min) 42 min    Activity Tolerance Patient tolerated treatment well           Past Medical History:  Diagnosis Date   Ascending aorta dilation (HCC) 09/14/2023   TTE 09/13/2023: EF 60-65, no RWMA, GR 1 DD, normal RVSF, trivial MR, moderate MAC, AVR with normal structure and function, mild dilation of ascending aorta (40 mm)    Bursitis of elbow 05/20/2013   Essential hypertension 03/12/2013   GERD (gastroesophageal reflux disease)    Hyperlipidemia    Hypertension    Left ventricular diastolic dysfunction, NYHA class 2 03/12/2013   LVH (left ventricular hypertrophy)    with aortic stenosis-bicuspid   PONV (postoperative nausea and vomiting)    as a child   S/P AVR (aortic valve replacement) 05/13/2013   S/P AVR, 05/13/13, 25 mm Edwards Magna-Ease pericardial valve 05/13/2013   Seasonal allergies    Severe aortic stenosis 03/12/2013   Past Surgical History:  Procedure Laterality Date   AORTIC VALVE REPLACEMENT N/A 05/13/2013   Procedure: AORTIC VALVE REPLACEMENT (AVR);  Surgeon: Drew MARLA Fellers, MD;  Location: [facility] OR;  Service: Open Heart Surgery;  Laterality: N/A;   APPENDECTOMY  age 89   CARDIAC CATHETERIZATION     COLONOSCOPY     X 2   HIATAL HERNIA REPAIR     INTRAOPERATIVE TRANSESOPHAGEAL ECHOCARDIOGRAM N/A 05/13/2013   Procedure: INTRAOPERATIVE TRANSESOPHAGEAL ECHOCARDIOGRAM;  Surgeon: Drew MARLA Fellers, MD;  Location: [facility] OR;  Service: Open Heart Surgery;  Laterality: N/A;   LEFT HEART CATHETERIZATION WITH CORONARY ANGIOGRAM N/A 03/15/2013    Procedure: LEFT HEART CATHETERIZATION WITH CORONARY ANGIOGRAM;  Surgeon: Drew DELENA Sor, MD;  Location: [facility] CATH LAB;  Service: Cardiovascular;  Laterality: N/A;   TONSILLECTOMY     TRANSTHORACIC ECHOCARDIOGRAM  03/04/2013   EF 55-60%, grade 2 diastolic dysfunction, AV with mod calcified leaflets & mild regurg, calcified MV annulus, LA mod dilated, RV mildly dilated   Patient Active Problem List   Diagnosis Date Noted   Ascending aorta dilation (HCC) 09/14/2023   CAD (coronary artery disease) 08/09/2023   Low back pain with sciatica 07/07/2023   Anxiety 04/17/2023   Cognitive impairment 01/12/2023   Parkinson's disease without dyskinesia or fluctuating manifestations (HCC) 01/12/2023   GERD (gastroesophageal reflux disease) 11/15/2015   Diplopia 02/21/2014   S/P AVR (aortic valve replacement) 07/13/2013   Rash of back, contact dermatitis resolved 05/31/2013   Bursitis of elbow 05/20/2013   S/P AVR, 05/13/13, 25 mm Edwards Magna-Ease pericardial valve 05/13/2013   Severe aortic stenosis 03/12/2013   Hyperlipidemia 03/12/2013   Essential hypertension 03/12/2013   Left ventricular diastolic dysfunction, NYHA class 2 03/12/2013    ONSET DATE: script 11/30/23  REFERRING DIAG: R47.01 (ICD-10-CM) - Aphasia R13.12 (ICD-10-CM) - Dysphagia, oropharyngeal phase F80.1 (ICD-10-CM) - Expressive language disorder R26.81 (ICD-10-CM) - Unsteadiness on feet  THERAPY DIAG:  Dysarthria and anarthria  Dysphagia, oropharyngeal phase  Cognitive communication deficit  Rationale for Evaluation and Treatment: Rehabilitation  SUBJECTIVE:   SUBJECTIVE  STATEMENT:  Drew Andrews asks question what ST can do for pt.   Pt accompanied by: significant other - Drew Andrews  PERTINENT HISTORY: See above  PAIN:  Are you having pain? Yes: NPRS scale: 1-2 Pain location: lt abdomen Pain description: constant, nagging Aggravating factors: leaning back Relieving factors: IDK  FALLS: Has patient fallen in last 6 months?   See PT evaluation for details  PATIENT GOALS: get myself better  OBJECTIVE:  Note: Objective measures were completed at Evaluation unless otherwise noted.  DIAGNOSTIC FINDINGS:  MBS 12/18/23: Clinical Impression: Drew Andrews presents with an oropharyngeal swallow that is largely [facility] and without aspiration of any of the liquid or solid barium consistencies tested. He had one instance of flash penetration (PAS 2) above the vocal cords when swallowing thin liquid barium with 13mm barium tablet. Only mild amount of pyriform residuals and trace amount of lingual and vallecular residuals remained s/p initial swallows. Retrograde flow of barium below and through PES was observed with liquid barium residuals in pyriform sinuses. 13mm barium tablet became briefly lodged at thoracic level of esophagus but fully transited with sips of thin liquid barium. No stasis or retrograde movement of barium observed in distal portion of esophagus. SLP spoke with Drew Andrews and his spouse in lobby after MBS completed and neither had any c/o dysphagia.   Factors that may increase risk of adverse event in presence of aspiration Drew Andrews & Drew Andrews 2021): Factors that may increase risk of adverse event in presence of aspiration Drew Andrews & Drew Andrews 2021): Reduced cognitive function   Recommendations/Plan: Swallowing Evaluation Recommendations Swallowing Evaluation Recommendations Recommendations: PO diet PO Diet Recommendation: Regular; Thin liquids (Level 0) Liquid Administration via: Cup; Straw Medication Administration: Whole meds with liquid Supervision: Patient able to self-feed Swallowing strategies  : Slow rate; Small bites/sips Postural changes: Position pt fully upright for meals Oral care recommendations: Oral care BID (2x/day)   MRI brain 02/16/23 IMPRESSION: This MRI of the brain without contrast shows the following: Mild to moderate generalized cortical atrophy without a lobar predominance.   The extent is more than typical for age. Few scattered T2/FLAIR hyperintense foci in the cerebral hemispheres consistent with minimal chronic microvascular ischemic change that would be typical for age No acute findings.  Completed audio recording of patients baseline voice without cueing from SLP: No   PATIENT REPORTED OUTCOME MEASURES (PROM): Cognitive Function: to be provided in first 4 sessions                                                                                                                            TREATMENT DATE:   01/15/24: SPEECH: Answered Drew Andrews's question about goals by giving summary of SLP's goals for pt. SLP explained rationale to administer CLQT today. SLP began CLQT - to be completed next session.  SWALLOWING: SLP demonstrated effortful swallow for pt (see pt instructions). Pt performed with initial min cues faded to mod I (handout). Pt to complete effortful swallow 15 reps  BID. SLP to provide [facility] next session, in order to maintain swallow strength. SLP to also work on swallowing with intent along with other compensations from [facility].   12/07/23: n/a  PATIENT EDUCATION: Education details: See treatment date Person educated: Patient Education method: Explanation, Demonstration, Verbal cues, and Handouts Education comprehension: verbalized understanding, returned demonstration, verbal cues required, and needs further education  HOME EXERCISE PROGRAM: Eventually for AB, and for swallowing (PRN)   GOALS: Goals reviewed with patient? No  SHORT TERM GOALS: Target date: 02/09/24  Pt will achieve AB at rest 80% of the time in 3 sessions Baseline: Goal status: INITIAL  2.  Pt will achieve AB 80% in sentence responses with min nonverbal cues in 2 sessions Baseline:  Goal status: INITIAL  3.  Pt will complete cognitive eval in first 2 sessions Baseline:  Goal status: INITIAL  4.  Pt will complete dysphagia HEP and strategies from MBS on 12/18/23  with rare min A in 3 sessions Baseline:  Goal status: INITIAL   LONG TERM GOALS: Target date: 03/06/24  Pt will improve PROM compared to initial administration Baseline:  Goal status: INITIAL  2.  Pt will complete dysphagia HEP and strategies rom MBS on 12/18/23 with mod I in 3 sessions Baseline:  Goal status: INITIAL  3.  Pt will achieve AB 80% in 5 minutes simple conversation with min nonverbal cues in 2 sessions Baseline:  Goal status: INITIAL   ASSESSMENT:  CLINICAL IMPRESSION: Patient is a 78 y.o. M who was seen today for treatment of swallowing, and dysarthria (decr'd breath support for speech) c/b rough voice intermittently. Pt's performance today was WFL/WNL; SLP will also teach pt swallowing with intent as a compensation in addition to slow rate and small bites/sips. Pt to learn AB to improve breath support for speech.   OBJECTIVE IMPAIRMENTS: Objective impairments include attention, memory, awareness, voice disorder, and dysphagia. These impairments are limiting patient from household responsibilities, ADLs/IADLs, effectively communicating at home and in community, and safety when swallowing.Factors affecting potential to achieve goals and functional outcome are ability to learn/carryover information.. Patient will benefit from skilled SLP services to address above impairments and improve overall function.  REHAB POTENTIAL: Good  PLAN:  SLP FREQUENCY: 2x/week  SLP DURATION: 8 weeks - pt did not schedule first ST until 01/15/24  PLANNED INTERVENTIONS: Aspiration precaution training, Pharyngeal strengthening exercises, Diet toleration management , Environmental controls, Trials of upgraded texture/liquids, Cueing hierachy, Cognitive reorganization, Internal/external aids, Oral motor exercises, Functional tasks, SLP instruction and feedback, Compensatory strategies, Patient/family education, [phone] Treatment of speech (30 or 45 min) , and 07473 Treatment of swallowing  function    Kiaan Overholser, CCC-SLP 01/15/2024, 3:03 PM   .oprcslps

## 2024-01-15 NOTE — Patient Instructions (Signed)
     SWALLOWING EXERCISES Do these 3 days per week You can use 1-2 drops of liquid to help you swallow, if your mouth gets dry  Effortful Swallows - Swallow as hard as you can - Do 15 reps, twice a day   (Second exercise coming next visit)

## 2024-01-16 ENCOUNTER — Telehealth: Payer: Self-pay | Admitting: Physician Assistant

## 2024-01-16 DIAGNOSIS — E7800 Pure hypercholesterolemia, unspecified: Secondary | ICD-10-CM

## 2024-01-16 MED ORDER — EZETIMIBE 10 MG PO TABS: 10.0000 mg | ORAL_TABLET | Freq: Every day | ORAL | 2 refills | Status: AC

## 2024-01-16 NOTE — Telephone Encounter (Signed)
 Pt's medication was sent to pt's pharmacy as requested. Confirmation received.

## 2024-01-16 NOTE — Telephone Encounter (Signed)
*  STAT* If patient is at the pharmacy, call can be transferred to refill team.   1. Which medications need to be refilled? (please list name of each medication and dose if known)  ezetimibe  (ZETIA ) 10 MG tablet  2. Which pharmacy/location (including street and city if local pharmacy) is medication to be sent to? [id number] - [place],  - [address] AT [facility] [address] Phone: [phone]  Fax: [phone]     3. Do they need a 30 day or 90 day supply? 10 pills until script comes in mail

## 2024-01-17 ENCOUNTER — Ambulatory Visit

## 2024-01-17 DIAGNOSIS — R471 Dysarthria and anarthria: Secondary | ICD-10-CM

## 2024-01-17 DIAGNOSIS — R1312 Dysphagia, oropharyngeal phase: Secondary | ICD-10-CM

## 2024-01-17 DIAGNOSIS — R41841 Cognitive communication deficit: Secondary | ICD-10-CM

## 2024-01-17 DIAGNOSIS — R2681 Unsteadiness on feet: Secondary | ICD-10-CM | POA: Diagnosis not present

## 2024-01-17 NOTE — Patient Instructions (Signed)
   SWALLOWING EXERCISES Do these 3 days per week You can use 1-2 drops of liquid to help you swallow, if your mouth gets dry   Effortful Swallows - Swallow as hard as you can - Do 15 reps, twice a day       2.   Mendelsohn  - "half swallow" exercise - Swallow, but keep your Adam's apple up by squeezing the swallow as hard as possible - Hold the squeeze for 5-7 seconds and then relax - Repeat 15 times, twice a day

## 2024-01-17 NOTE — Therapy (Signed)
 OUTPATIENT SPEECH LANGUAGE PATHOLOGY [facility]   Patient Name: Drew Andrews MRN: [id number] DOB:27-Mar-1946, 78 y.o., male Today's Date: 01/17/2024  PCP: Vernadine Ade, MD REFERRING PROVIDER: Onita Duos, MD  END OF SESSION:  End of Session - 01/17/24 1236     Visit Number 3    Number of Visits 17    Date for SLP Re-Evaluation 03/06/24    SLP Start Time 1234    SLP Stop Time  1315    SLP Time Calculation (min) 41 min    Activity Tolerance Patient tolerated treatment well           Past Medical History:  Diagnosis Date   Ascending aorta dilation (HCC) 09/14/2023   TTE 09/13/2023: EF 60-65, no RWMA, GR 1 DD, normal RVSF, trivial MR, moderate MAC, AVR with normal structure and function, mild dilation of ascending aorta (40 mm)    Bursitis of elbow 05/20/2013   Essential hypertension 03/12/2013   GERD (gastroesophageal reflux disease)    Hyperlipidemia    Hypertension    Left ventricular diastolic dysfunction, NYHA class 2 03/12/2013   LVH (left ventricular hypertrophy)    with aortic stenosis-bicuspid   PONV (postoperative nausea and vomiting)    as a child   S/P AVR (aortic valve replacement) 05/13/2013   S/P AVR, 05/13/13, 25 mm Edwards Magna-Ease pericardial valve 05/13/2013   Seasonal allergies    Severe aortic stenosis 03/12/2013   Past Surgical History:  Procedure Laterality Date   AORTIC VALVE REPLACEMENT N/A 05/13/2013   Procedure: AORTIC VALVE REPLACEMENT (AVR);  Surgeon: Dorise MARLA Fellers, MD;  Location: [facility] OR;  Service: Open Heart Surgery;  Laterality: N/A;   APPENDECTOMY  age 81   CARDIAC CATHETERIZATION     COLONOSCOPY     X 2   HIATAL HERNIA REPAIR     INTRAOPERATIVE TRANSESOPHAGEAL ECHOCARDIOGRAM N/A 05/13/2013   Procedure: INTRAOPERATIVE TRANSESOPHAGEAL ECHOCARDIOGRAM;  Surgeon: Dorise MARLA Fellers, MD;  Location: [facility] OR;  Service: Open Heart Surgery;  Laterality: N/A;   LEFT HEART CATHETERIZATION WITH CORONARY ANGIOGRAM N/A 03/15/2013    Procedure: LEFT HEART CATHETERIZATION WITH CORONARY ANGIOGRAM;  Surgeon: Debby DELENA Sor, MD;  Location: [facility] CATH LAB;  Service: Cardiovascular;  Laterality: N/A;   TONSILLECTOMY     TRANSTHORACIC ECHOCARDIOGRAM  03/04/2013   EF 55-60%, grade 2 diastolic dysfunction, AV with mod calcified leaflets & mild regurg, calcified MV annulus, LA mod dilated, RV mildly dilated   Patient Active Problem List   Diagnosis Date Noted   Ascending aorta dilation (HCC) 09/14/2023   CAD (coronary artery disease) 08/09/2023   Low back pain with sciatica 07/07/2023   Anxiety 04/17/2023   Cognitive impairment 01/12/2023   Parkinson's disease without dyskinesia or fluctuating manifestations (HCC) 01/12/2023   GERD (gastroesophageal reflux disease) 11/15/2015   Diplopia 02/21/2014   S/P AVR (aortic valve replacement) 07/13/2013   Rash of back, contact dermatitis resolved 05/31/2013   Bursitis of elbow 05/20/2013   S/P AVR, 05/13/13, 25 mm Edwards Magna-Ease pericardial valve 05/13/2013   Severe aortic stenosis 03/12/2013   Hyperlipidemia 03/12/2013   Essential hypertension 03/12/2013   Left ventricular diastolic dysfunction, NYHA class 2 03/12/2013    ONSET DATE: script 11/30/23  REFERRING DIAG: R47.01 (ICD-10-CM) - Aphasia R13.12 (ICD-10-CM) - Dysphagia, oropharyngeal phase F80.1 (ICD-10-CM) - Expressive language disorder R26.81 (ICD-10-CM) - Unsteadiness on feet  THERAPY DIAG:  Dysarthria and anarthria  Dysphagia, oropharyngeal phase  Cognitive communication deficit  Rationale for Evaluation and Treatment: Rehabilitation  SUBJECTIVE:   SUBJECTIVE  STATEMENT:  I just did 15 of the swallowing so that's all I need for the day.   Pt accompanied by: self   PERTINENT HISTORY: See above  PAIN:  Are you having pain? Yes: NPRS scale: 1-2 Pain location: lt abdomen Pain description: constant, nagging Aggravating factors: leaning back Relieving factors: IDK  FALLS: Has patient fallen in last 6  months?  See PT evaluation for details  PATIENT GOALS: get myself better  OBJECTIVE:  Note: Objective measures were completed at Evaluation unless otherwise noted.  DIAGNOSTIC FINDINGS:  MBS 12/18/23: Clinical Impression: Drew Andrews presents with an oropharyngeal swallow that is largely [facility] and without aspiration of any of the liquid or solid barium consistencies tested. He had one instance of flash penetration (PAS 2) above the vocal cords when swallowing thin liquid barium with 13mm barium tablet. Only mild amount of pyriform residuals and trace amount of lingual and vallecular residuals remained s/p initial swallows. Retrograde flow of barium below and through PES was observed with liquid barium residuals in pyriform sinuses. 13mm barium tablet became briefly lodged at thoracic level of esophagus but fully transited with sips of thin liquid barium. No stasis or retrograde movement of barium observed in distal portion of esophagus. SLP spoke with Drew Andrews and his spouse in lobby after MBS completed and neither had any c/o dysphagia.   Factors that may increase risk of adverse event in presence of aspiration [name] 2021): Factors that may increase risk of adverse event in presence of aspiration [name] 2021): Reduced cognitive function   Recommendations/Plan: Swallowing Evaluation Recommendations Swallowing Evaluation Recommendations Recommendations: PO diet PO Diet Recommendation: Regular; Thin liquids (Level 0) Liquid Administration via: Cup; Straw Medication Administration: Whole meds with liquid Supervision: Patient able to self-feed Swallowing strategies  : Slow rate; Small bites/sips Postural changes: Position pt fully upright for meals Oral care recommendations: Oral care BID (2x/day)   MRI brain 02/16/23 IMPRESSION: This MRI of the brain without contrast shows the following: Mild to moderate generalized cortical atrophy without a lobar  predominance.  The extent is more than typical for age. Few scattered T2/FLAIR hyperintense foci in the cerebral hemispheres consistent with minimal chronic microvascular ischemic change that would be typical for age No acute findings.    Cognitive Linguistic Quick Test (CLQT)  AGE - 70-89   The Cognitive Linguistic Quick Test was administered to assess the relative status of five cognitive domains: attention, memory, language, executive functioning, and visuospatial skills. Scores from 10 tasks were used to estimate severity ratings (standardized for age groups 18-69 years and 70-89 years) for each domain, a clock drawing task, as well as an overall composite severity rating of cognition.      Task Score Criterion Cut Scores  Personal Facts 8/8 8  Symbol Cancellation 10/12 10  Confrontation Naming 10/10 10  Clock Drawing  9/13 11  Story Retelling 6/10 5  Symbol Trails 10/10 6  Generative Naming 6/9 4  Design Memory 3/6 4  Mazes  4/8 4  Design Generation 6/13 5    Cognitive Domain Composite Score Severity Rating  Attention 160/215 Low-WNL  Memory 128/185 Mild  Executive Function 26/40 WNL  Language 30/37 WNL  Visuospatial Skills 70/105 WNL  Clock Drawing  9/13 Mild  Composite Severity Rating  WNL   Scores indicate that Drew Andrews has difficulty with memory and attention. Clock drawing subtest targets organization, planning, attention, and visuospatial skills. SLP suspects attention and visuospatial skills were difficult for pt on this  subtest due to poorly positioned numbers in relation to the circle, origin of the hands, and inaccurate pointing of hands.    Completed audio recording of patients baseline voice without cueing from SLP: No  PATIENT REPORTED OUTCOME MEASURES (PROM): Cognitive Function: to be provided in first 4 sessions                                                                                                                            TREATMENT DATE:    01/17/24: SWALLOWING: SLP worked with pt today with Claudette and added this to his HEP for swallowing. Pt was independent after 4 demonstrations. SLP noticed pt was tracking completion for the last two days and provided him an exercise tracking sheet.  SPEECH: Completed CLQT today- results above.  01/15/24: SPEECH: Answered Sharon's question about goals by giving summary of SLP's goals for pt. SLP explained rationale to administer CLQT today. SLP began CLQT - to be completed next session.  SWALLOWING: SLP demonstrated effortful swallow for pt (see pt instructions). Pt performed with initial min cues faded to mod I (handout). Pt to complete effortful swallow 15 reps BID. SLP to provide [facility] next session, in order to maintain swallow strength. SLP to also work on swallowing with intent along with other compensations from [facility].   12/07/23: n/a  PATIENT EDUCATION: Education details: See treatment date Person educated: Patient Education method: Explanation, Demonstration, Verbal cues, and Handouts Education comprehension: verbalized understanding, returned demonstration, verbal cues required, and needs further education  HOME EXERCISE PROGRAM: Eventually for AB, and for swallowing (PRN)   GOALS: Goals reviewed with patient? No  SHORT TERM GOALS: Target date: 02/09/24  Pt will achieve AB at rest 80% of the time in 3 sessions Baseline: Goal status: INITIAL  2.  Pt will achieve AB 80% in sentence responses with min nonverbal cues in 2 sessions Baseline:  Goal status: INITIAL  3.  Pt will complete cognitive eval in first 2 sessions Baseline:  Goal status: met  4.  Pt will complete dysphagia HEP and strategies from MBS on 12/18/23 with rare min A in 3 sessions Baseline:  Goal status: INITIAL  5.  Pt will tell SLP two memory strategies he could use in 2 sessions Baseline:  Goal status: INITIAL   LONG TERM GOALS: Target date: 03/06/24  Pt will improve PROM compared to  initial administration Baseline:  Goal status: INITIAL  2.  Pt will complete dysphagia HEP and strategies rom MBS on 12/18/23 with mod I in 3 sessions Baseline:  Goal status: INITIAL  3.  Pt will achieve AB 80% in 5 minutes simple conversation with min nonverbal cues in 2 sessions Baseline:  Goal status: INITIAL  4.  Pt will use one memory strategy successfully in/between two sessions Baseline:  Goal status: INITIAL  5.  Pt will demo WFL alternating attention between two simple tasks in 3 sessions Baseline:  Goal status: INITIAL  ASSESSMENT:  CLINICAL IMPRESSION: Patient is  a 78 y.o. M who was seen today for treatment of swallowing, and dysarthria (decr'd breath support for speech) c/b rough voice intermittently. Pt's performance today was WFL/WNL. Pt to learn AB to improve breath support for speech. SLP completed CLQT with pt today with mild deficits noted in attention and in clock drawing. SLP suspects deficits highlighted in attention and visuospatial skills were the problem with clock drawing. SLP added goal for memory compensations and for attention.  OBJECTIVE IMPAIRMENTS: Objective impairments include attention, memory, awareness, voice disorder, and dysphagia. These impairments are limiting patient from household responsibilities, ADLs/IADLs, effectively communicating at home and in community, and safety when swallowing.Factors affecting potential to achieve goals and functional outcome are ability to learn/carryover information.. Patient will benefit from skilled SLP services to address above impairments and improve overall function.  REHAB POTENTIAL: Good  PLAN:  SLP FREQUENCY: 2x/week  SLP DURATION: 8 weeks - pt did not schedule first ST until 01/15/24  PLANNED INTERVENTIONS: Aspiration precaution training, Pharyngeal strengthening exercises, Diet toleration management , Environmental controls, Trials of upgraded texture/liquids, Cueing hierachy, Cognitive  reorganization, Internal/external aids, Oral motor exercises, Functional tasks, SLP instruction and feedback, Compensatory strategies, Patient/family education, [phone] Treatment of speech (30 or 45 min) , and [id number] Treatment of swallowing function    Carolyn Maniscalco, CCC-SLP 01/17/2024, 12:36 PM   .oprcslps

## 2024-01-18 ENCOUNTER — Other Ambulatory Visit (HOSPITAL_BASED_OUTPATIENT_CLINIC_OR_DEPARTMENT_OTHER): Payer: Self-pay

## 2024-01-18 MED ORDER — FLUZONE HIGH-DOSE 0.5 ML IM SUSY
0.5000 mL | PREFILLED_SYRINGE | Freq: Once | INTRAMUSCULAR | 0 refills | Status: AC
  Filled 2024-01-18: qty 0.5, 1d supply, fill #0

## 2024-01-18 MED ORDER — COMIRNATY 30 MCG/0.3ML IM SUSY
0.3000 mL | PREFILLED_SYRINGE | Freq: Once | INTRAMUSCULAR | 0 refills | Status: AC
  Filled 2024-01-18: qty 0.3, 1d supply, fill #0

## 2024-01-22 ENCOUNTER — Encounter

## 2024-01-24 ENCOUNTER — Ambulatory Visit

## 2024-01-24 DIAGNOSIS — R41841 Cognitive communication deficit: Secondary | ICD-10-CM

## 2024-01-24 DIAGNOSIS — R471 Dysarthria and anarthria: Secondary | ICD-10-CM

## 2024-01-24 DIAGNOSIS — R1312 Dysphagia, oropharyngeal phase: Secondary | ICD-10-CM

## 2024-01-24 DIAGNOSIS — R2681 Unsteadiness on feet: Secondary | ICD-10-CM | POA: Diagnosis not present

## 2024-01-24 NOTE — Therapy (Signed)
 OUTPATIENT SPEECH LANGUAGE PATHOLOGY [facility]   Patient Name: Drew Andrews MRN: [id number] DOB:04/24/1946, 78 y.o., male Today's Date: 01/24/2024  PCP: Drew Ade, MD REFERRING PROVIDER: Onita Duos, MD  END OF SESSION:  End of Session - 01/24/24 1239     Visit Number 4    Number of Visits 17    Date for Recertification  03/06/24    SLP Start Time 1234    SLP Stop Time  1315    SLP Time Calculation (min) 41 min    Activity Tolerance Patient tolerated treatment well           Past Medical History:  Diagnosis Date   Ascending aorta dilation 09/14/2023   TTE 09/13/2023: EF 60-65, no RWMA, GR 1 DD, normal RVSF, trivial MR, moderate MAC, AVR with normal structure and function, mild dilation of ascending aorta (40 mm)    Bursitis of elbow 05/20/2013   Essential hypertension 03/12/2013   GERD (gastroesophageal reflux disease)    Hyperlipidemia    Hypertension    Left ventricular diastolic dysfunction, NYHA class 2 03/12/2013   LVH (left ventricular hypertrophy)    with aortic stenosis-bicuspid   PONV (postoperative nausea and vomiting)    as a child   S/P AVR (aortic valve replacement) 05/13/2013   S/P AVR, 05/13/13, 25 mm Edwards Magna-Ease pericardial valve 05/13/2013   Seasonal allergies    Severe aortic stenosis 03/12/2013   Past Surgical History:  Procedure Laterality Date   AORTIC VALVE REPLACEMENT N/A 05/13/2013   Procedure: AORTIC VALVE REPLACEMENT (AVR);  Surgeon: Drew MARLA Fellers, MD;  Location: [facility] OR;  Service: Open Heart Surgery;  Laterality: N/A;   APPENDECTOMY  age 48   CARDIAC CATHETERIZATION     COLONOSCOPY     X 2   HIATAL HERNIA REPAIR     INTRAOPERATIVE TRANSESOPHAGEAL ECHOCARDIOGRAM N/A 05/13/2013   Procedure: INTRAOPERATIVE TRANSESOPHAGEAL ECHOCARDIOGRAM;  Surgeon: Drew MARLA Fellers, MD;  Location: [facility] OR;  Service: Open Heart Surgery;  Laterality: N/A;   LEFT HEART CATHETERIZATION WITH CORONARY ANGIOGRAM N/A 03/15/2013    Procedure: LEFT HEART CATHETERIZATION WITH CORONARY ANGIOGRAM;  Surgeon: Drew DELENA Sor, MD;  Location[facility] C[facility] LAB;  Service: Cardiovascular;  Laterality: N/A;   TONSILLECTOMY     TRANSTHORACIC ECHOCARDIOGRAM  03/04/2013   EF 55-60%, grade 2 diastolic dysfunction, AV with mod calcified leaflets & mild regurg, calcified MV annulus, LA mod dilated, RV mildly dilated   Patient Active Problem List   Diagnosis Date Noted   Ascending aorta dilation 09/14/2023   CAD (coronary artery disease) 08/09/2023   Low back pain with sciatica 07/07/2023   Anxiety 04/17/2023   Cognitive impairment 01/12/2023   Parkinson's disease without dyskinesia or fluctuating manifestations (HCC) 01/12/2023   GERD (gastroesophageal reflux disease) 11/15/2015   Diplopia 02/21/2014   S/P AVR (aortic valve replacement) 07/13/2013   Rash of back, contact dermatitis resolved 05/31/2013   Bursitis of elbow 05/20/2013   S/P AVR, 05/13/13, 25 mm Edwards Magna-Ease pericardial valve 05/13/2013   Severe aortic stenosis 03/12/2013   Hyperlipidemia 03/12/2013   Essential hypertension 03/12/2013   Left ventricular diastolic dysfunction, NYHA class 2 03/12/2013    ONSET DATE: script 11/30/23  REFERRING DIAG: R47.01 (ICD-10-CM) - Aphasia R13.12 (ICD-10-CM) - Dysphagia, oropharyngeal phase F80.1 (ICD-10-CM) - Expressive language disorder R26.81 (ICD-10-CM) - Unsteadiness on feet  THERAPY DIAG:  Dysarthria and anarthria  Dysphagia, oropharyngeal phase  Cognitive communication deficit  Rationale for Evaluation and Treatment: Rehabilitation  SUBJECTIVE:   SUBJECTIVE STATEMENT:  Can I do 15 and 15 (of the swallow HEP)?   Pt accompanied by: self   PERTINENT HISTORY: See above  PAIN:  Are you having pain? Yes: NPRS scale: 1-2 Pain location: lt abdomen Pain description: constant, nagging Aggravating factors: leaning back Relieving factors: IDK  FALLS: Has patient fallen in last 6 months?  See PT evaluation for  details  PATIENT GOALS: get myself better  OBJECTIVE:  Note: Objective measures were completed at Evaluation unless otherwise noted.  DIAGNOSTIC FINDINGS:  MBS 12/18/23: Clinical Impression: Drew Andrews presents with an oropharyngeal swallow that is largely [facility] and without aspiration of any of the liquid or solid barium consistencies tested. He had one instance of flash penetration (PAS 2) above the vocal cords when swallowing thin liquid barium with 13mm barium tablet. Only mild amount of pyriform residuals and trace amount of lingual and vallecular residuals remained s/p initial swallows. Retrograde flow of barium below and through PES was observed with liquid barium residuals in pyriform sinuses. 13mm barium tablet became briefly lodged at thoracic level of esophagus but fully transited with sips of thin liquid barium. No stasis or retrograde movement of barium observed in distal portion of esophagus. SLP spoke with Drew Andrews and his spouse in lobby after MBS completed and neither had any c/o dysphagia.   Factors that may increase risk of adverse event in presence of aspiration Noe & Drew Andrews 2021): Factors that may increase risk of adverse event in presence of aspiration Noe & Drew Andrews 2021): Reduced cognitive function   Recommendations/Plan: Swallowing Evaluation Recommendations Swallowing Evaluation Recommendations Recommendations: PO diet PO Diet Recommendation: Regular; Thin liquids (Level 0) Liquid Administration via: Cup; Straw Medication Administration: Whole meds with liquid Supervision: Patient able to self-feed Swallowing strategies  : Slow rate; Small bites/sips Postural changes: Position pt fully upright for meals Oral care recommendations: Oral care BID (2x/day)   MRI brain 02/16/23 IMPRESSION: This MRI of the brain without contrast shows the following: Mild to moderate generalized cortical atrophy without a lobar predominance.  The extent is more than  typical for age. Few scattered T2/FLAIR hyperintense foci in the cerebral hemispheres consistent with minimal chronic microvascular ischemic change that would be typical for age No acute findings.    Cognitive Linguistic Quick Test (CLQT)  AGE - 70-89   The Cognitive Linguistic Quick Test was administered to assess the relative status of five cognitive domains: attention, memory, language, executive functioning, and visuospatial skills. Scores from 10 tasks were used to estimate severity ratings (standardized for age groups 18-69 years and 70-89 years) for each domain, a clock drawing task, as well as an overall composite severity rating of cognition.      Task Score Criterion Cut Scores  Personal Facts 8/8 8  Symbol Cancellation 10/12 10  Confrontation Naming 10/10 10  Clock Drawing  9/13 11  Story Retelling 6/10 5  Symbol Trails 10/10 6  Generative Naming 6/9 4  Design Memory 3/6 4  Mazes  4/8 4  Design Generation 6/13 5    Cognitive Domain Composite Score Severity Rating  Attention 160/215 Low-WNL  Memory 128/185 Mild  Executive Function 26/40 WNL  Language 30/37 WNL  Visuospatial Skills 70/105 WNL  Clock Drawing  9/13 Mild  Composite Severity Rating  WNL   Scores indicate that Garrel has difficulty with memory and attention. Clock drawing subtest targets organization, planning, attention, and visuospatial skills. SLP suspects attention and visuospatial skills were difficult for pt on this subtest due to poorly positioned numbers in  relation to the circle, origin of the hands, and inaccurate pointing of hands.    Completed audio recording of patients baseline voice without cueing from SLP: No  PATIENT REPORTED OUTCOME MEASURES (PROM): Cognitive Function: to be provided in first 4 sessions                                                                                                                            TREATMENT DATE:   01/24/24: Needs CLQT next session. SPEECH: SLP  taught pt about AB and the rationale for this. Pt answered yes/no questions and wh questions with 90% success with AB. SWALLOWING: SLP assisted pt with Mendelsohn and effortful. He req'd min A initially for [employer].  01/17/24: SWALLOWING: SLP worked with pt today with Claudette and added this to his HEP for swallowing. Pt was independent after 4 demonstrations. SLP noticed pt was tracking completion for the last two days and provided him an exercise tracking sheet.  SPEECH: Completed CLQT today- results above.  01/15/24: SPEECH: Answered Sharon's question about goals by giving summary of SLP's goals for pt. SLP explained rationale to administer CLQT today. SLP began CLQT - to be completed next session.  SWALLOWING: SLP demonstrated effortful swallow for pt (see pt instructions). Pt performed with initial min cues faded to mod I (handout). Pt to complete effortful swallow 15 reps BID. SLP to provide [facility] next session, in order to maintain swallow strength. SLP to also work on swallowing with intent along with other compensations from [facility].   12/07/23: n/a  PATIENT EDUCATION: Education details: See treatment date Person educated: Patient Education method: Explanation, Demonstration, Verbal cues, and Handouts Education comprehension: verbalized understanding, returned demonstration, verbal cues required, and needs further education  HOME EXERCISE PROGRAM: AB, and for swallowing    GOALS: Goals reviewed with patient? No  SHORT TERM GOALS: Target date: 02/09/24  Pt will achieve AB at rest 80% of the time in 3 sessions Baseline:01/24/24 Goal status: INITIAL  2.  Pt will achieve AB 80% in sentence responses with min nonverbal cues in 2 sessions Baseline:  Goal status: INITIAL  3.  Pt will complete cognitive eval in first 2 sessions Baseline:  Goal status: met  4.  Pt will complete dysphagia HEP and strategies from MBS on 12/18/23 with rare min A in 3 sessions Baseline:  Goal  status: INITIAL  5.  Pt will tell SLP two memory strategies he could use in 2 sessions Baseline:  Goal status: INITIAL   LONG TERM GOALS: Target date: 03/06/24  Pt will improve PROM compared to initial administration Baseline:  Goal status: INITIAL  2.  Pt will complete dysphagia HEP and strategies rom MBS on 12/18/23 with mod I in 3 sessions Baseline:  Goal status: INITIAL  3.  Pt will achieve AB 80% in 5 minutes simple conversation with min nonverbal cues in 2 sessions Baseline:  Goal status: INITIAL  4.  Pt will use one memory strategy successfully in/between  two sessions Baseline:  Goal status: INITIAL  5.  Pt will demo WFL alternating attention between two simple tasks in 3 sessions Baseline:  Goal status: INITIAL  ASSESSMENT:  CLINICAL IMPRESSION: Patient is a 78 y.o. M who was seen today for treatment of swallowing, and dysarthria (decr'd breath support for speech) c/b rough voice intermittently. Pt to learn AB to improve breath support for speech. See treatment date above for today's date for further details on today's session. SLP suspects deficits highlighted in attention and visuospatial skills were the problem with clock drawing. SLP added goal for memory compensations and for attention.  OBJECTIVE IMPAIRMENTS: Objective impairments include attention, memory, awareness, voice disorder, and dysphagia. These impairments are limiting patient from household responsibilities, ADLs/IADLs, effectively communicating at home and in community, and safety when swallowing.Factors affecting potential to achieve goals and functional outcome are ability to learn/carryover information.. Patient will benefit from skilled SLP services to address above impairments and improve overall function.  REHAB POTENTIAL: Good  PLAN:  SLP FREQUENCY: 2x/week  SLP DURATION: 8 weeks - pt did not schedule first ST until 01/15/24  PLANNED INTERVENTIONS: Aspiration precaution training,  Pharyngeal strengthening exercises, Diet toleration management , Environmental controls, Trials of upgraded texture/liquids, Cueing hierachy, Cognitive reorganization, Internal/external aids, Oral motor exercises, Functional tasks, SLP instruction and feedback, Compensatory strategies, Patient/family education, [phone] Treatment of speech (30 or 45 min) , and [id number] Treatment of swallowing function    Hopie Pellegrin, CCC-SLP 01/24/2024, 12:39 PM   .oprcslps

## 2024-01-25 ENCOUNTER — Telehealth: Payer: Self-pay | Admitting: Family Medicine

## 2024-01-25 NOTE — Telephone Encounter (Signed)
 [facility] cxl

## 2024-01-26 ENCOUNTER — Other Ambulatory Visit: Payer: Self-pay | Admitting: Neurological Surgery

## 2024-01-29 ENCOUNTER — Ambulatory Visit

## 2024-01-29 DIAGNOSIS — R41841 Cognitive communication deficit: Secondary | ICD-10-CM

## 2024-01-29 DIAGNOSIS — R2681 Unsteadiness on feet: Secondary | ICD-10-CM | POA: Diagnosis not present

## 2024-01-29 DIAGNOSIS — R1312 Dysphagia, oropharyngeal phase: Secondary | ICD-10-CM

## 2024-01-29 DIAGNOSIS — R471 Dysarthria and anarthria: Secondary | ICD-10-CM

## 2024-01-29 NOTE — Therapy (Signed)
 OUTPATIENT SPEECH LANGUAGE PATHOLOGY [facility]   Patient Name: Drew Andrews MRN: [id number] DOB:Dec 31, 1945, 78 y.o., male Today's Date: 01/29/2024  PCP: Vernadine Ade, MD REFERRING PROVIDER: Onita Duos, MD  END OF SESSION:  End of Session - 01/29/24 1242     Visit Number 5    Number of Visits 17    Date for Recertification  03/06/24    SLP Start Time 1235    SLP Stop Time  1315    SLP Time Calculation (min) 40 min    Activity Tolerance Patient tolerated treatment well           Past Medical History:  Diagnosis Date   Ascending aorta dilation 09/14/2023   TTE 09/13/2023: EF 60-65, no RWMA, GR 1 DD, normal RVSF, trivial MR, moderate MAC, AVR with normal structure and function, mild dilation of ascending aorta (40 mm)    Bursitis of elbow 05/20/2013   Essential hypertension 03/12/2013   GERD (gastroesophageal reflux disease)    Hyperlipidemia    Hypertension    Left ventricular diastolic dysfunction, NYHA class 2 03/12/2013   LVH (left ventricular hypertrophy)    with aortic stenosis-bicuspid   PONV (postoperative nausea and vomiting)    as a child   S/P AVR (aortic valve replacement) 05/13/2013   S/P AVR, 05/13/13, 25 mm Edwards Magna-Ease pericardial valve 05/13/2013   Seasonal allergies    Severe aortic stenosis 03/12/2013   Past Surgical History:  Procedure Laterality Date   AORTIC VALVE REPLACEMENT N/A 05/13/2013   Procedure: AORTIC VALVE REPLACEMENT (AVR);  Surgeon: Dorise MARLA Fellers, MD;  Location: [facility] OR;  Service: Open Heart Surgery;  Laterality: N/A;   APPENDECTOMY  age 1   CARDIAC CATHETERIZATION     COLONOSCOPY     X 2   HIATAL HERNIA REPAIR     INTRAOPERATIVE TRANSESOPHAGEAL ECHOCARDIOGRAM N/A 05/13/2013   Procedure: INTRAOPERATIVE TRANSESOPHAGEAL ECHOCARDIOGRAM;  Surgeon: Dorise MARLA Fellers, MD;  Location: [facility] OR;  Service: Open Heart Surgery;  Laterality: N/A;   LEFT HEART CATHETERIZATION WITH CORONARY ANGIOGRAM N/A 03/15/2013    Procedure: LEFT HEART CATHETERIZATION WITH CORONARY ANGIOGRAM;  Surgeon: Debby DELENA Sor, MD;  Location: [facility] CATH LAB;  Service: Cardiovascular;  Laterality: N/A;   TONSILLECTOMY     TRANSTHORACIC ECHOCARDIOGRAM  03/04/2013   EF 55-60%, grade 2 diastolic dysfunction, AV with mod calcified leaflets & mild regurg, calcified MV annulus, LA mod dilated, RV mildly dilated   Patient Active Problem List   Diagnosis Date Noted   Ascending aorta dilation 09/14/2023   CAD (coronary artery disease) 08/09/2023   Low back pain with sciatica 07/07/2023   Anxiety 04/17/2023   Cognitive impairment 01/12/2023   Parkinson's disease without dyskinesia or fluctuating manifestations (HCC) 01/12/2023   GERD (gastroesophageal reflux disease) 11/15/2015   Diplopia 02/21/2014   S/P AVR (aortic valve replacement) 07/13/2013   Rash of back, contact dermatitis resolved 05/31/2013   Bursitis of elbow 05/20/2013   S/P AVR, 05/13/13, 25 mm Edwards Magna-Ease pericardial valve 05/13/2013   Severe aortic stenosis 03/12/2013   Hyperlipidemia 03/12/2013   Essential hypertension 03/12/2013   Left ventricular diastolic dysfunction, NYHA class 2 03/12/2013    ONSET DATE: script 11/30/23  REFERRING DIAG: R47.01 (ICD-10-CM) - Aphasia R13.12 (ICD-10-CM) - Dysphagia, oropharyngeal phase F80.1 (ICD-10-CM) - Expressive language disorder R26.81 (ICD-10-CM) - Unsteadiness on feet  THERAPY DIAG:  Dysarthria and anarthria  Dysphagia, oropharyngeal phase  Cognitive communication deficit  Rationale for Evaluation and Treatment: Rehabilitation  SUBJECTIVE:   SUBJECTIVE STATEMENT:  Can I do 15 and 15 (of the swallow HEP)?   Pt accompanied by: self   PERTINENT HISTORY: See above  PAIN:  Are you having pain? Yes: NPRS scale: 1-2 Pain location: lt abdomen Pain description: constant, nagging Aggravating factors: leaning back Relieving factors: IDK  FALLS: Has patient fallen in last 6 months?  See PT evaluation for  details  PATIENT GOALS: get myself better  OBJECTIVE:  Note: Objective measures were completed at Evaluation unless otherwise noted.  DIAGNOSTIC FINDINGS:  MBS 12/18/23: Clinical Impression: Drew Andrews presents with an oropharyngeal swallow that is largely [facility] and without aspiration of any of the liquid or solid barium consistencies tested. He had one instance of flash penetration (PAS 2) above the vocal cords when swallowing thin liquid barium with 13mm barium tablet. Only mild amount of pyriform residuals and trace amount of lingual and vallecular residuals remained s/p initial swallows. Retrograde flow of barium below and through PES was observed with liquid barium residuals in pyriform sinuses. 13mm barium tablet became briefly lodged at thoracic level of esophagus but fully transited with sips of thin liquid barium. No stasis or retrograde movement of barium observed in distal portion of esophagus. SLP spoke with Drew Andrews and his spouse in lobby after MBS completed and neither had any c/o dysphagia.   Factors that may increase risk of adverse event in presence of aspiration [name] 2021): Factors that may increase risk of adverse event in presence of aspiration [name] 2021): Reduced cognitive function   Recommendations/Plan: Swallowing Evaluation Recommendations Swallowing Evaluation Recommendations Recommendations: PO diet PO Diet Recommendation: Regular; Thin liquids (Level 0) Liquid Administration via: Cup; Straw Medication Administration: Whole meds with liquid Supervision: Patient able to self-feed Swallowing strategies  : Slow rate; Small bites/sips Postural changes: Position pt fully upright for meals Oral care recommendations: Oral care BID (2x/day)   MRI brain 02/16/23 IMPRESSION: This MRI of the brain without contrast shows the following: Mild to moderate generalized cortical atrophy without a lobar predominance.  The extent is more than  typical for age. Few scattered T2/FLAIR hyperintense foci in the cerebral hemispheres consistent with minimal chronic microvascular ischemic change that would be typical for age No acute findings.    Cognitive Linguistic Quick Test (CLQT)  AGE - 70-89   The Cognitive Linguistic Quick Test was administered to assess the relative status of five cognitive domains: attention, memory, language, executive functioning, and visuospatial skills. Scores from 10 tasks were used to estimate severity ratings (standardized for age groups 18-69 years and 70-89 years) for each domain, a clock drawing task, as well as an overall composite severity rating of cognition.      Task Score Criterion Cut Scores  Personal Facts 8/8 8  Symbol Cancellation 10/12 10  Confrontation Naming 10/10 10  Clock Drawing  9/13 11  Story Retelling 6/10 5  Symbol Trails 10/10 6  Generative Naming 6/9 4  Design Memory 3/6 4  Mazes  4/8 4  Design Generation 6/13 5    Cognitive Domain Composite Score Severity Rating  Attention 160/215 Low-WNL  Memory 128/185 Mild  Executive Function 26/40 WNL  Language 30/37 WNL  Visuospatial Skills 70/105 WNL  Clock Drawing  9/13 Mild  Composite Severity Rating  WNL   Scores indicate that Drew Andrews has difficulty with memory and attention. Clock drawing subtest targets organization, planning, attention, and visuospatial skills. SLP suspects attention and visuospatial skills were difficult for pt on this subtest due to poorly positioned numbers in  relation to the circle, origin of the hands, and inaccurate pointing of hands.    Completed audio recording of patients baseline voice without cueing from SLP: No  PATIENT REPORTED OUTCOME MEASURES (PROM): Cognitive Function: to be provided in first 4 sessions                                                                                                                            TREATMENT DATE:   01/29/24: CLQT completed. PROM provided  (cognitive - long). SWALLOW: pt performed WNL with effortful but req'd mod A with Mendelsohn due to not holding squeeze. When SLP cued pt he demonstrated slow thyroid  return instead of a hold. Pt was independent with exercise at end of task.  SPEECH: SLP worked with pt and his AB today in speech. In sentence responses pt demonstrated 95% AB once cued for tactile cue. In conversation of 5 minutes pt was successful with AB 80%.   01/24/24: Needs CLQT next session. SPEECH: SLP taught pt about AB and the rationale for this. Pt answered yes/no questions and wh questions with 90% success with AB. SWALLOWING: SLP assisted pt with Mendelsohn and effortful. He req'd min A initially for [employer].  01/17/24: SWALLOWING: SLP worked with pt today with Claudette and added this to his HEP for swallowing. Pt was independent after 4 demonstrations. SLP noticed pt was tracking completion for the last two days and provided him an exercise tracking sheet.  SPEECH: Completed CLQT today- results above.  01/15/24: SPEECH: Answered Sharon's question about goals by giving summary of SLP's goals for pt. SLP explained rationale to administer CLQT today. SLP began CLQT - to be completed next session.  SWALLOWING: SLP demonstrated effortful swallow for pt (see pt instructions). Pt performed with initial min cues faded to mod I (handout). Pt to complete effortful swallow 15 reps BID. SLP to provide [facility] next session, in order to maintain swallow strength. SLP to also work on swallowing with intent along with other compensations from [facility].   12/07/23: n/a  PATIENT EDUCATION: Education details: See treatment date Person educated: Patient Education method: Explanation, Demonstration, Verbal cues, and Handouts Education comprehension: verbalized understanding, returned demonstration, verbal cues required, and needs further education  HOME EXERCISE PROGRAM: AB, and for swallowing    GOALS: Goals reviewed with  patient? No  SHORT TERM GOALS: Target date: 02/09/24  Pt will achieve AB at rest 80% of the time in 3 sessions Baseline:01/24/24 Goal status: INITIAL  2.  Pt will achieve AB 80% in sentence responses with min nonverbal cues in 2 sessions Baseline: 01/29/24 Goal status: INITIAL  3.  Pt will complete cognitive eval in first 2 sessions Baseline:  Goal status: met  4.  Pt will complete dysphagia HEP and strategies from MBS on 12/18/23 with rare min A in 3 sessions Baseline:  Goal status: INITIAL  5.  Pt will tell SLP two memory strategies he could use in 2 sessions Baseline: 01/29/24 Goal status:  INITIAL   LONG TERM GOALS: Target date: 03/06/24  Pt will improve PROM compared to initial administration Baseline:  Goal status: INITIAL  2.  Pt will complete dysphagia HEP and strategies rom MBS on 12/18/23 with mod I in 3 sessions Baseline:  Goal status: INITIAL  3.  Pt will achieve AB 80% in 5 minutes simple conversation with min nonverbal cues in 2 sessions Baseline:  Goal status: INITIAL  4.  Pt will use one memory strategy successfully in/between two sessions Baseline:  Goal status: INITIAL  5.  Pt will demo WFL alternating attention between two simple tasks in 3 sessions Baseline:  Goal status: INITIAL  ASSESSMENT:  CLINICAL IMPRESSION: Patient is a 78 y.o. M who was seen today for treatment of swallowing, and dysarthria (decr'd breath support for speech) c/b rough voice intermittently. Pt is learning to use AB at the sentence level to improve breath support for speech. See treatment date above for today's date for further details on today's session. SLP suspects deficits highlighted in attention and visuospatial skills were the problem with clock drawing. SLP added goal for memory compensations and for attention.  OBJECTIVE IMPAIRMENTS: Objective impairments include attention, memory, awareness, voice disorder, and dysphagia. These impairments are limiting patient from  household responsibilities, ADLs/IADLs, effectively communicating at home and in community, and safety when swallowing.Factors affecting potential to achieve goals and functional outcome are ability to learn/carryover information.. Patient will benefit from skilled SLP services to address above impairments and improve overall function.  REHAB POTENTIAL: Good  PLAN:  SLP FREQUENCY: 2x/week  SLP DURATION: 8 weeks - pt did not schedule first ST until 01/15/24  PLANNED INTERVENTIONS: Aspiration precaution training, Pharyngeal strengthening exercises, Diet toleration management , Environmental controls, Trials of upgraded texture/liquids, Cueing hierachy, Cognitive reorganization, Internal/external aids, Oral motor exercises, Functional tasks, SLP instruction and feedback, Compensatory strategies, Patient/family education, [phone] Treatment of speech (30 or 45 min) , and [id number] Treatment of swallowing function    Jibran Crookshanks, CCC-SLP 01/29/2024, 12:42 PM   .[email or web address]

## 2024-01-31 ENCOUNTER — Ambulatory Visit: Attending: Neurology

## 2024-01-31 DIAGNOSIS — R471 Dysarthria and anarthria: Secondary | ICD-10-CM | POA: Insufficient documentation

## 2024-01-31 DIAGNOSIS — R1312 Dysphagia, oropharyngeal phase: Secondary | ICD-10-CM | POA: Diagnosis not present

## 2024-01-31 DIAGNOSIS — R41841 Cognitive communication deficit: Secondary | ICD-10-CM | POA: Insufficient documentation

## 2024-01-31 NOTE — Therapy (Signed)
 OUTPATIENT SPEECH LANGUAGE PATHOLOGY [facility]   Patient Name: Drew Andrews MRN: [id number] DOB:[birth year], 78 y.o., male Today's Date: 01/31/2024  PCP: Vernadine Ade, MD REFERRING PROVIDER: Onita Duos, MD  END OF SESSION:  End of Session - 01/31/24 1239     Visit Number 6    Number of Visits 17    Date for Recertification  03/06/24    SLP Start Time 1233    SLP Stop Time  1315    SLP Time Calculation (min) 42 min    Activity Tolerance Patient tolerated treatment well           Past Medical History:  Diagnosis Date   Ascending aorta dilation 09/14/2023   TTE 09/13/2023: EF 60-65, no RWMA, GR 1 DD, normal RVSF, trivial MR, moderate MAC, AVR with normal structure and function, mild dilation of ascending aorta (40 mm)    Bursitis of elbow 05/20/2013   Essential hypertension 03/12/2013   GERD (gastroesophageal reflux disease)    Hyperlipidemia    Hypertension    Left ventricular diastolic dysfunction, NYHA class 2 03/12/2013   LVH (left ventricular hypertrophy)    with aortic stenosis-bicuspid   PONV (postoperative nausea and vomiting)    as a child   S/P AVR (aortic valve replacement) 05/13/2013   S/P AVR, 05/13/13, 25 mm Edwards Magna-Ease pericardial valve 05/13/2013   Seasonal allergies    Severe aortic stenosis 03/12/2013   Past Surgical History:  Procedure Laterality Date   AORTIC VALVE REPLACEMENT N/A 05/13/2013   Procedure: AORTIC VALVE REPLACEMENT (AVR);  Surgeon: Dorise MARLA Fellers, MD;  Location[facility] OR;  Service: Open Heart Surgery;  Laterality: N/A;   APPENDECTOMY  age 74   CARDIAC CATHETERIZATION     COLONOSCOPY     X 2   HIATAL HERNIA REPAIR     INTRAOPERATIVE TRANSESOPHAGEAL ECHOCARDIOGRAM N/A 05/13/2013   Procedure: INTRAOPERATIVE TRANSESOPHAGEAL ECHOCARDIOGRAM;  Surgeon: Dorise MARLA Fellers, MD;  Location: [facility] OR;  Service: Open Heart Surgery;  Laterality: N/A;   LEFT HEART CATHETERIZATION WITH CORONARY ANGIOGRAM N/A 03/15/2013    Procedure: LEFT HEART CATHETERIZATION WITH CORONARY ANGIOGRAM;  Surgeon: Debby DELENA Sor, MD;  Location: [facility] C[facility] LAB;  Service: Cardiovascular;  Laterality: N/A;   TONSILLECTOMY     TRANSTHORACIC ECHOCARDIOGRAM  03/04/2013   EF 55-60%, grade 2 diastolic dysfunction, AV with mod calcified leaflets & mild regurg, calcified MV annulus, LA mod dilated, RV mildly dilated   Patient Active Problem List   Diagnosis Date Noted   Ascending aorta dilation 09/14/2023   CAD (coronary artery disease) 08/09/2023   Low back pain with sciatica 07/07/2023   Anxiety 04/17/2023   Cognitive impairment 01/12/2023   Parkinson's disease without dyskinesia or fluctuating manifestations (HCC) 01/12/2023   GERD (gastroesophageal reflux disease) 11/15/2015   Diplopia 02/21/2014   S/P AVR (aortic valve replacement) 07/13/2013   Rash of back, contact dermatitis resolved 05/31/2013   Bursitis of elbow 05/20/2013   S/P AVR, 05/13/13, 25 mm Edwards Magna-Ease pericardial valve 05/13/2013   Severe aortic stenosis 03/12/2013   Hyperlipidemia 03/12/2013   Essential hypertension 03/12/2013   Left ventricular diastolic dysfunction, NYHA class 2 03/12/2013    ONSET DATE: script 11/30/23  REFERRING DIAG: R47.01 (ICD-10-CM) - Aphasia R13.12 (ICD-10-CM) - Dysphagia, oropharyngeal phase F80.1 (ICD-10-CM) - Expressive language disorder R26.81 (ICD-10-CM) - Unsteadiness on feet  THERAPY DIAG:  Dysarthria and anarthria  Dysphagia, oropharyngeal phase  Cognitive communication deficit  Rationale for Evaluation and Treatment: Rehabilitation  SUBJECTIVE:   SUBJECTIVE STATEMENT:  I just focus on the fact that I have to do 120 a week (Swallowing exercises).   Pt accompanied by: self   PERTINENT HISTORY: See above  PAIN:  Are you having pain? No  FALLS: Has patient fallen in last 6 months?  See PT evaluation for details  PATIENT GOALS: get myself better  OBJECTIVE:  Note: Objective measures were completed at  Evaluation unless otherwise noted.  DIAGNOSTIC FINDINGS:  MBS 12/18/23: Clinical Impression: Cayce Quezada presents with an oropharyngeal swallow that is largely [facility] and without aspiration of any of the liquid or solid barium consistencies tested. He had one instance of flash penetration (PAS 2) above the vocal cords when swallowing thin liquid barium with 13mm barium tablet. Only mild amount of pyriform residuals and trace amount of lingual and vallecular residuals remained s/p initial swallows. Retrograde flow of barium below and through PES was observed with liquid barium residuals in pyriform sinuses. 13mm barium tablet became briefly lodged at thoracic level of esophagus but fully transited with sips of thin liquid barium. No stasis or retrograde movement of barium observed in distal portion of esophagus. SLP spoke with Mr. Cuccia and his spouse in lobby after MBS completed and neither had any c/o dysphagia.   Factors that may increase risk of adverse event in presence of aspiration [name] 2021): Factors that may increase risk of adverse event in presence of aspiration [name] 2021): Reduced cognitive function   Recommendations/Plan: Swallowing Evaluation Recommendations Swallowing Evaluation Recommendations Recommendations: PO diet PO Diet Recommendation: Regular; Thin liquids (Level 0) Liquid Administration via: Cup; Straw Medication Administration: Whole meds with liquid Supervision: Patient able to self-feed Swallowing strategies  : Slow rate; Small bites/sips Postural changes: Position pt fully upright for meals Oral care recommendations: Oral care BID (2x/day)   MRI brain 02/16/23 IMPRESSION: This MRI of the brain without contrast shows the following: Mild to moderate generalized cortical atrophy without a lobar predominance.  The extent is more than typical for age. Few scattered T2/FLAIR hyperintense foci in the cerebral hemispheres consistent with  minimal chronic microvascular ischemic change that would be typical for age No acute findings.    Cognitive Linguistic Quick Test (CLQT)  AGE - 70-89   The Cognitive Linguistic Quick Test was administered to assess the relative status of five cognitive domains: attention, memory, language, executive functioning, and visuospatial skills. Scores from 10 tasks were used to estimate severity ratings (standardized for age groups 18-69 years and 70-89 years) for each domain, a clock drawing task, as well as an overall composite severity rating of cognition.      Task Score Criterion Cut Scores  Personal Facts 8/8 8  Symbol Cancellation 10/12 10  Confrontation Naming 10/10 10  Clock Drawing  9/13 11  Story Retelling 6/10 5  Symbol Trails 10/10 6  Generative Naming 6/9 4  Design Memory 3/6 4  Mazes  4/8 4  Design Generation 6/13 5    Cognitive Domain Composite Score Severity Rating  Attention 160/215 Low-WNL  Memory 128/185 Mild  Executive Function 26/40 WNL  Language 30/37 WNL  Visuospatial Skills 70/105 WNL  Clock Drawing  9/13 Mild  Composite Severity Rating  WNL   Scores indicate that Garrel has difficulty with memory and attention. Clock drawing subtest targets organization, planning, attention, and visuospatial skills. SLP suspects attention and visuospatial skills were difficult for pt on this subtest due to poorly positioned numbers in relation to the circle, origin of the hands, and inaccurate pointing of  hands.    Completed audio recording of patients baseline voice without cueing from SLP: No  PATIENT REPORTED OUTCOME MEASURES (PROM): Cognitive Function: Pt scored himself (with help from wife) 75/140 with lower scores indicating more of an effect of deficits on daily tasks. He scored 5 (no difficulty) with using a clock. 1 (cannot do) was scored with remembering where things were put away or placed, and remembering a list of 4-5 errands or items without writing them down.  2 (often or a lot - 2-3 times/day was scored with difficulty: getting things organized, doing more than one thing at a time, and trouble: remembering things I was supposed to do, remembering the name of a familiar person, thinking clearly, and had difficulty: with slow thinking, working hard to pay attention or I would make a mistake, making decisions, and planning out steps for a task.                                                                                                                             TREATMENT DATE:   01/31/24: Pt returned his PROM with responses coinciding with reduced memory, attention, and processing speed. Score as above. Today SLP worked with pt on processing speed helping him sequence 6-steps in a task. Pt req'd occasional mod cues with the first set of 4 taking almost 10 minutes to complete. Pt agreed after this that it should not have taken that long to complete. The second set of 4 took pt 5 minutes 25 seconds to complete. Pt read over two sequences of 6 steps prior to working these out, due to SLP pointing out pt was continuing to have errors due to beginning to number steps without  having read all of the steps in the sequence. Homework for sequencing. SLP cued pt to read steps prior to performing.   01/29/24: CLQT completed. PROM provided (cognitive - long). SWALLOW: pt performed WNL with effortful but req'd mod A with Mendelsohn due to not holding squeeze. When SLP cued pt he demonstrated slow thyroid  return instead of a hold. Pt was independent with exercise at end of task.  SPEECH: SLP worked with pt and his AB today in speech. In sentence responses pt demonstrated 95% AB once cued for tactile cue. In conversation of 5 minutes pt was successful with AB 80%.   01/24/24: Needs CLQT next session. SPEECH: SLP taught pt about AB and the rationale for this. Pt answered yes/no questions and wh questions with 90% success with AB. SWALLOWING: SLP assisted pt with  Mendelsohn and effortful. He req'd min A initially for [employer].  01/17/24: SWALLOWING: SLP worked with pt today with Claudette and added this to his HEP for swallowing. Pt was independent after 4 demonstrations. SLP noticed pt was tracking completion for the last two days and provided him an exercise tracking sheet.  SPEECH: Completed CLQT today- results above.  01/15/24: SPEECH: Answered Sharon's question about goals by giving summary of SLP's goals  for pt. SLP explained rationale to administer CLQT today. SLP began CLQT - to be completed next session.  SWALLOWING: SLP demonstrated effortful swallow for pt (see pt instructions). Pt performed with initial min cues faded to mod I (handout). Pt to complete effortful swallow 15 reps BID. SLP to provide [facility] next session, in order to maintain swallow strength. SLP to also work on swallowing with intent along with other compensations from [facility].   12/07/23: n/a  PATIENT EDUCATION: Education details: See treatment date Person educated: Patient Education method: Explanation, Demonstration, Verbal cues, and Handouts Education comprehension: verbalized understanding, returned demonstration, verbal cues required, and needs further education  HOME EXERCISE PROGRAM: AB, and for swallowing    GOALS: Goals reviewed with patient? No  SHORT TERM GOALS: Target date: 02/09/24  Pt will achieve AB at rest 80% of the time in 3 sessions Baseline:01/24/24 Goal status: INITIAL  2.  Pt will achieve AB 80% in sentence responses with min nonverbal cues in 2 sessions Baseline: 01/29/24 Goal status: INITIAL  3.  Pt will complete cognitive eval in first 2 sessions Baseline:  Goal status: met  4.  Pt will complete dysphagia HEP and strategies from MBS on 12/18/23 with rare min A in 3 sessions Baseline:  Goal status: INITIAL  5.  Pt will tell SLP two memory strategies he could use in 2 sessions Baseline: 01/29/24 Goal status: INITIAL   LONG TERM  GOALS: Target date: 03/06/24  Pt will improve PROM compared to initial administration Baseline:  Goal status: INITIAL  2.  Pt will complete dysphagia HEP and strategies rom MBS on 12/18/23 with mod I in 3 sessions Baseline:  Goal status: INITIAL  3.  Pt will achieve AB 80% in 5 minutes simple conversation with min nonverbal cues in 2 sessions Baseline:  Goal status: INITIAL  4.  Pt will use one memory strategy successfully in/between two sessions Baseline:  Goal status: INITIAL  5.  Pt will demo WFL alternating attention between two simple tasks in 3 sessions Baseline:  Goal status: INITIAL  ASSESSMENT:  CLINICAL IMPRESSION: Patient is a 78 y.o. M who was seen today for treatment of cognition, swallowing, and dysarthria (decr'd breath support for speech) c/b rough voice intermittently. Pt is learning to use AB at the sentence level to improve breath support for speech. See treatment date above for today's date for further details on today's session. SLP suspects deficits highlighted in attention and visuospatial skills were the problem with clock drawing. SLP added goal for memory compensations and for attention.  OBJECTIVE IMPAIRMENTS: Objective impairments include attention, memory, awareness, voice disorder, and dysphagia. These impairments are limiting patient from household responsibilities, ADLs/IADLs, effectively communicating at home and in community, and safety when swallowing.Factors affecting potential to achieve goals and functional outcome are ability to learn/carryover information.. Patient will benefit from skilled SLP services to address above impairments and improve overall function.  REHAB POTENTIAL: Good  PLAN:  SLP FREQUENCY: 2x/week  SLP DURATION: 8 weeks - pt did not schedule first ST until 01/15/24  PLANNED INTERVENTIONS: Aspiration precaution training, Pharyngeal strengthening exercises, Diet toleration management , Environmental controls, Trials of  upgraded texture/liquids, Cueing hierachy, Cognitive reorganization, Internal/external aids, Oral motor exercises, Functional tasks, SLP instruction and feedback, Compensatory strategies, Patient/family education, [phone] Treatment of speech (30 or 45 min) , and [id number] Treatment of swallowing function    Nyara Capell, CCC-SLP 01/31/2024, 12:40 PM   .[email or web address]

## 2024-02-05 ENCOUNTER — Ambulatory Visit

## 2024-02-05 DIAGNOSIS — R471 Dysarthria and anarthria: Secondary | ICD-10-CM | POA: Diagnosis not present

## 2024-02-05 DIAGNOSIS — R41841 Cognitive communication deficit: Secondary | ICD-10-CM | POA: Diagnosis not present

## 2024-02-05 DIAGNOSIS — R1312 Dysphagia, oropharyngeal phase: Secondary | ICD-10-CM

## 2024-02-05 NOTE — Therapy (Signed)
 OUTPATIENT SPEECH LANGUAGE PATHOLOGY [facility]   Patient Name: Drew Andrews MRN: [id number] DOB:1946/04/14, 78 y.o., male Today's Date: 02/05/2024  PCP: Drew Ade, MD REFERRING PROVIDER: Onita Duos, MD  END OF SESSION:  End of Session - 02/05/24 1252     Visit Number 5    Number of Visits 17    Date for Recertification  03/06/24    SLP Start Time 1233    SLP Stop Time  1315    SLP Time Calculation (min) 42 min    Activity Tolerance Patient tolerated treatment well            Past Medical History:  Diagnosis Date   Ascending aorta dilation 09/14/2023   TTE 09/13/2023: EF 60-65, no RWMA, GR 1 DD, normal RVSF, trivial MR, moderate MAC, AVR with normal structure and function, mild dilation of ascending aorta (40 mm)    Bursitis of elbow 05/20/2013   Essential hypertension 03/12/2013   GERD (gastroesophageal reflux disease)    Hyperlipidemia    Hypertension    Left ventricular diastolic dysfunction, NYHA class 2 03/12/2013   LVH (left ventricular hypertrophy)    with aortic stenosis-bicuspid   PONV (postoperative nausea and vomiting)    as a child   S/P AVR (aortic valve replacement) 05/13/2013   S/P AVR, 05/13/13, 25 mm Edwards Magna-Ease pericardial valve 05/13/2013   Seasonal allergies    Severe aortic stenosis 03/12/2013   Past Surgical History:  Procedure Laterality Date   AORTIC VALVE REPLACEMENT N/A 05/13/2013   Procedure: AORTIC VALVE REPLACEMENT (AVR);  Surgeon: Drew MARLA Fellers, MD;  Location: [facility] OR;  Service: Open Heart Surgery;  Laterality: N/A;   APPENDECTOMY  age 60   CARDIAC CATHETERIZATION     COLONOSCOPY     X 2   HIATAL HERNIA REPAIR     INTRAOPERATIVE TRANSESOPHAGEAL ECHOCARDIOGRAM N/A 05/13/2013   Procedure: INTRAOPERATIVE TRANSESOPHAGEAL ECHOCARDIOGRAM;  Surgeon: Drew MARLA Fellers, MD;  Location: [facility] OR;  Service: Open Heart Surgery;  Laterality: N/A;   LEFT HEART CATHETERIZATION WITH CORONARY ANGIOGRAM N/A 03/15/2013    Procedure: LEFT HEART CATHETERIZATION WITH CORONARY ANGIOGRAM;  Surgeon: Drew DELENA Sor, MD;  Location: [facility] CATH LAB;  Service: Cardiovascular;  Laterality: N/A;   TONSILLECTOMY     TRANSTHORACIC ECHOCARDIOGRAM  03/04/2013   EF 55-60%, grade 2 diastolic dysfunction, AV with mod calcified leaflets & mild regurg, calcified MV annulus, LA mod dilated, RV mildly dilated   Patient Active Problem List   Diagnosis Date Noted   Ascending aorta dilation 09/14/2023   CAD (coronary artery disease) 08/09/2023   Low back pain with sciatica 07/07/2023   Anxiety 04/17/2023   Cognitive impairment 01/12/2023   Parkinson's disease without dyskinesia or fluctuating manifestations (HCC) 01/12/2023   GERD (gastroesophageal reflux disease) 11/15/2015   Diplopia 02/21/2014   S/P AVR (aortic valve replacement) 07/13/2013   Rash of back, contact dermatitis resolved 05/31/2013   Bursitis of elbow 05/20/2013   S/P AVR, 05/13/13, 25 mm Edwards Magna-Ease pericardial valve 05/13/2013   Severe aortic stenosis 03/12/2013   Hyperlipidemia 03/12/2013   Essential hypertension 03/12/2013   Left ventricular diastolic dysfunction, NYHA class 2 03/12/2013    ONSET DATE: script 11/30/23  REFERRING DIAG: R47.01 (ICD-10-CM) - Aphasia R13.12 (ICD-10-CM) - Dysphagia, oropharyngeal phase F80.1 (ICD-10-CM) - Expressive language disorder R26.81 (ICD-10-CM) - Unsteadiness on feet  THERAPY DIAG:  Dysarthria and anarthria  Dysphagia, oropharyngeal phase  Cognitive communication deficit  Rationale for Evaluation and Treatment: Rehabilitation  SUBJECTIVE:   SUBJECTIVE STATEMENT:  I did the swallowing ones yesterday.   Pt accompanied by: self   PERTINENT HISTORY: See above  PAIN:  Are you having pain? Yes: NPRS scale: 2/10 Pain location: abdomen Pain description: ache Aggravating factors: sitting still Relieving factors: standing up  FALLS: Has patient fallen in last 6 months?  See PT evaluation for  details  PATIENT GOALS: get myself better  OBJECTIVE:  Note: Objective measures were completed at Evaluation unless otherwise noted.  DIAGNOSTIC FINDINGS:  MBS 12/18/23: Clinical Impression: Drew Andrews presents with an oropharyngeal swallow that is largely [facility] and without aspiration of any of the liquid or solid barium consistencies tested. He had one instance of flash penetration (PAS 2) above the vocal cords when swallowing thin liquid barium with 13mm barium tablet. Only mild amount of pyriform residuals and trace amount of lingual and vallecular residuals remained s/p initial swallows. Retrograde flow of barium below and through PES was observed with liquid barium residuals in pyriform sinuses. 13mm barium tablet became briefly lodged at thoracic level of esophagus but fully transited with sips of thin liquid barium. No stasis or retrograde movement of barium observed in distal portion of esophagus. SLP spoke with Drew Andrews and his spouse in lobby after MBS completed and neither had any c/o dysphagia.   Factors that may increase risk of adverse event in presence of aspiration Drew Andrews & Drew Andrews 2021): Factors that may increase risk of adverse event in presence of aspiration Drew Andrews & Drew Andrews 2021): Reduced cognitive function   Recommendations/Plan: Swallowing Evaluation Recommendations Swallowing Evaluation Recommendations Recommendations: PO diet PO Diet Recommendation: Regular; Thin liquids (Level 0) Liquid Administration via: Cup; Straw Medication Administration: Whole meds with liquid Supervision: Patient able to self-feed Swallowing strategies  : Slow rate; Small bites/sips Postural changes: Position pt fully upright for meals Oral care recommendations: Oral care BID (2x/day)   MRI brain 02/16/23 IMPRESSION: This MRI of the brain without contrast shows the following: Mild to moderate generalized cortical atrophy without a lobar predominance.  The extent is more than  typical for age. Few scattered T2/FLAIR hyperintense foci in the cerebral hemispheres consistent with minimal chronic microvascular ischemic change that would be typical for age No acute findings.    Cognitive Linguistic Quick Test (CLQT)  AGE - 70-89   The Cognitive Linguistic Quick Test was administered to assess the relative status of five cognitive domains: attention, memory, language, executive functioning, and visuospatial skills. Scores from 10 tasks were used to estimate severity ratings (standardized for age groups 18-69 years and 70-89 years) for each domain, a clock drawing task, as well as an overall composite severity rating of cognition.      Task Score Criterion Cut Scores  Personal Facts 8/8 8  Symbol Cancellation 10/12 10  Confrontation Naming 10/10 10  Clock Drawing  9/13 11  Story Retelling 6/10 5  Symbol Trails 10/10 6  Generative Naming 6/9 4  Design Memory 3/6 4  Mazes  4/8 4  Design Generation 6/13 5    Cognitive Domain Composite Score Severity Rating  Attention 160/215 Low-WNL  Memory 128/185 Mild  Executive Function 26/40 WNL  Language 30/37 WNL  Visuospatial Skills 70/105 WNL  Clock Drawing  9/13 Mild  Composite Severity Rating  WNL   Scores indicate that Drew Andrews has difficulty with memory and attention. Clock drawing subtest targets organization, planning, attention, and visuospatial skills. SLP suspects attention and visuospatial skills were difficult for pt on this subtest due to poorly positioned numbers in relation to the circle, origin  of the hands, and inaccurate pointing of hands.    Completed audio recording of patients baseline voice without cueing from SLP: No  PATIENT REPORTED OUTCOME MEASURES (PROM): Cognitive Function: Pt scored himself (with help from wife) 75/140 with lower scores indicating more of an effect of deficits on daily tasks. He scored 5 (no difficulty) with using a clock. 1 (cannot do) was scored with remembering where  things were put away or placed, and remembering a list of 4-5 errands or items without writing them down. 2 (often or a lot - 2-3 times/day was scored with difficulty: getting things organized, doing more than one thing at a time, and trouble: remembering things I was supposed to do, remembering the name of a familiar person, thinking clearly, and had difficulty: with slow thinking, working hard to pay attention or I would make a mistake, making decisions, and planning out steps for a task.                                                                                                                             TREATMENT DATE:  AB - Abdominal breathing  02/05/24: Pt will work on AB in speaking next session.  SWALLOWING: SLP guided pt through his dysphagia exercises with rare min A for procedure, and mod cues for tracking exercises correctly using his tracking sheet. SLP encouraged pt to have 120 each over 4 days. SPEECH: Pt entered with homework completed 85%, with what was completed 100% success. SLP assisted pt with corrections and pt needed extra time. He demonstrated AB at rest 70-75%.  01/31/24: Pt returned his PROM with responses coinciding with reduced memory, attention, and processing speed. Score as above. Today SLP worked with pt on processing speed helping him sequence 6-steps in a task. Pt req'd occasional mod cues with the first set of 4 taking almost 10 minutes to complete. Pt agreed after this that it should not have taken that long to complete. The second set of 4 took pt 5 minutes 25 seconds to complete. Pt read over two sequences of 6 steps prior to working these out, due to SLP pointing out pt was continuing to have errors due to beginning to number steps without  having read all of the steps in the sequence. Homework for sequencing. SLP cued pt to read steps prior to performing.   01/29/24: CLQT completed. PROM provided (cognitive - long). SWALLOW: pt performed WNL with effortful  but req'd mod A with Mendelsohn due to not holding squeeze. When SLP cued pt he demonstrated slow thyroid  return instead of a hold. Pt was independent with exercise at end of task.  SPEECH: SLP worked with pt and his AB today in speech. In sentence responses pt demonstrated 95% AB once cued for tactile cue. In conversation of 5 minutes pt was successful with AB 80%.   01/24/24: Needs CLQT next session. SPEECH: SLP taught pt about AB and the rationale for this. Pt answered yes/no  questions and wh questions with 90% success with AB. SWALLOWING: SLP assisted pt with Mendelsohn and effortful. He req'd min A initially for [employer].  01/17/24: SWALLOWING: SLP worked with pt today with Claudette and added this to his HEP for swallowing. Pt was independent after 4 demonstrations. SLP noticed pt was tracking completion for the last two days and provided him an exercise tracking sheet.  SPEECH: Completed CLQT today- results above.  01/15/24: SPEECH: Answered Sharon's question about goals by giving summary of SLP's goals for pt. SLP explained rationale to administer CLQT today. SLP began CLQT - to be completed next session.  SWALLOWING: SLP demonstrated effortful swallow for pt (see pt instructions). Pt performed with initial min cues faded to mod I (handout). Pt to complete effortful swallow 15 reps BID. SLP to provide [facility] next session, in order to maintain swallow strength. SLP to also work on swallowing with intent along with other compensations from [facility].   12/07/23: n/a  PATIENT EDUCATION: Education details: See treatment date Person educated: Patient Education method: Explanation, Demonstration, Verbal cues, and Handouts Education comprehension: verbalized understanding, returned demonstration, verbal cues required, and needs further education  HOME EXERCISE PROGRAM: AB, and for swallowing    GOALS: Goals reviewed with patient? No  SHORT TERM GOALS: Target date: 02/09/24  Pt will  achieve AB at rest 80% of the time in 3 sessions Baseline:01/24/24  Goal status: INITIAL  2.  Pt will achieve AB 80% in sentence responses with min nonverbal cues in 2 sessions Baseline: 01/29/24 Goal status: INITIAL  3.  Pt will complete cognitive eval in first 2 sessions Baseline:  Goal status: met  4.  Pt will complete dysphagia HEP and strategies from MBS on 12/18/23 with rare min A in 3 sessions Baseline: (HEP 02/05/24) Goal status: INITIAL  5.  Pt will tell SLP two memory strategies he could use in 2 sessions Baseline: 01/29/24 Goal status: INITIAL   LONG TERM GOALS: Target date: 03/06/24  Pt will improve PROM compared to initial administration Baseline:  Goal status: INITIAL  2.  Pt will complete dysphagia HEP and strategies rom MBS on 12/18/23 with mod I in 3 sessions Baseline:  Goal status: INITIAL  3.  Pt will achieve AB 80% in 5 minutes simple conversation with min nonverbal cues in 2 sessions Baseline:  Goal status: INITIAL  4.  Pt will use one memory strategy successfully in/between two sessions Baseline:  Goal status: INITIAL  5.  Pt will demo WFL alternating attention between two simple tasks in 3 sessions Baseline:  Goal status: INITIAL  ASSESSMENT:  CLINICAL IMPRESSION: Patient is a 78 y.o. M who was seen today for treatment of cognition, and/or swallowing, and/or dysarthria (decr'd breath support for speech) c/b rough voice intermittently. Pt is learning to use AB in speech. See treatment date above for today's date for further details on today's session.   OBJECTIVE IMPAIRMENTS: Objective impairments include attention, memory, awareness, voice disorder, and dysphagia. These impairments are limiting patient from household responsibilities, ADLs/IADLs, effectively communicating at home and in community, and safety when swallowing.Factors affecting potential to achieve goals and functional outcome are ability to learn/carryover information.. Patient will  benefit from skilled SLP services to address above impairments and improve overall function.  REHAB POTENTIAL: Good  PLAN:  SLP FREQUENCY: 2x/week  SLP DURATION: 8 weeks - pt did not schedule first ST until 01/15/24  PLANNED INTERVENTIONS: Aspiration precaution training, Pharyngeal strengthening exercises, Diet toleration management , Environmental controls, Trials of upgraded texture/liquids, Cueing  hierachy, Cognitive reorganization, Internal/external aids, Oral motor exercises, Functional tasks, SLP instruction and feedback, Compensatory strategies, Patient/family education, [phone] Treatment of speech (30 or 45 min) , and [id number] Treatment of swallowing function    Chaya Dehaan, CCC-SLP 02/05/2024, 12:52 PM   .[email or web address]

## 2024-02-07 ENCOUNTER — Ambulatory Visit

## 2024-02-07 DIAGNOSIS — R1312 Dysphagia, oropharyngeal phase: Secondary | ICD-10-CM

## 2024-02-07 DIAGNOSIS — R41841 Cognitive communication deficit: Secondary | ICD-10-CM | POA: Diagnosis not present

## 2024-02-07 DIAGNOSIS — R471 Dysarthria and anarthria: Secondary | ICD-10-CM

## 2024-02-07 NOTE — Therapy (Unsigned)
 OUTPATIENT SPEECH LANGUAGE PATHOLOGY [facility]   Patient Name: Drew Andrews MRN: [id number] DOB:[birth year], 78 y.o., male Today's Date: 02/07/2024  PCP: Vernadine Ade, MD REFERRING PROVIDER: Onita Duos, MD  END OF SESSION:  End of Session - 02/07/24 1244     Visit Number 6    Number of Visits 17    Date for Recertification  03/06/24    SLP Start Time 1237   checked in 1235   SLP Stop Time  1315    SLP Time Calculation (min) 38 min    Activity Tolerance Patient tolerated treatment well            Past Medical History:  Diagnosis Date   Ascending aorta dilation 09/14/2023   TTE 09/13/2023: EF 60-65, no RWMA, GR 1 DD, normal RVSF, trivial MR, moderate MAC, AVR with normal structure and function, mild dilation of ascending aorta (40 mm)    Bursitis of elbow 05/20/2013   Essential hypertension 03/12/2013   GERD (gastroesophageal reflux disease)    Hyperlipidemia    Hypertension    Left ventricular diastolic dysfunction, NYHA class 2 03/12/2013   LVH (left ventricular hypertrophy)    with aortic stenosis-bicuspid   PONV (postoperative nausea and vomiting)    as a child   S/P AVR (aortic valve replacement) 05/13/2013   S/P AVR, 05/13/13, 25 mm Edwards Magna-Ease pericardial valve 05/13/2013   Seasonal allergies    Severe aortic stenosis 03/12/2013   Past Surgical History:  Procedure Laterality Date   AORTIC VALVE REPLACEMENT N/A 05/13/2013   Procedure: AORTIC VALVE REPLACEMENT (AVR);  Surgeon: Dorise MARLA Fellers, MD;  Location: [facility] OR;  Service: Open Heart Surgery;  Laterality: N/A;   APPENDECTOMY  age 38   CARDIAC CATHETERIZATION     COLONOSCOPY     X 2   HIATAL HERNIA REPAIR     INTRAOPERATIVE TRANSESOPHAGEAL ECHOCARDIOGRAM N/A 05/13/2013   Procedure: INTRAOPERATIVE TRANSESOPHAGEAL ECHOCARDIOGRAM;  Surgeon: Dorise MARLA Fellers, MD;  Location: [facility] OR;  Service: Open Heart Surgery;  Laterality: N/A;   LEFT HEART CATHETERIZATION WITH CORONARY ANGIOGRAM N/A  03/15/2013   Procedure: LEFT HEART CATHETERIZATION WITH CORONARY ANGIOGRAM;  Surgeon: Debby DELENA Sor, MD;  Location: [facility] CATH LAB;  Service: Cardiovascular;  Laterality: N/A;   TONSILLECTOMY     TRANSTHORACIC ECHOCARDIOGRAM  03/04/2013   EF 55-60%, grade 2 diastolic dysfunction, AV with mod calcified leaflets & mild regurg, calcified MV annulus, LA mod dilated, RV mildly dilated   Patient Active Problem List   Diagnosis Date Noted   Ascending aorta dilation 09/14/2023   CAD (coronary artery disease) 08/09/2023   Low back pain with sciatica 07/07/2023   Anxiety 04/17/2023   Cognitive impairment 01/12/2023   Parkinson's disease without dyskinesia or fluctuating manifestations (HCC) 01/12/2023   GERD (gastroesophageal reflux disease) 11/15/2015   Diplopia 02/21/2014   S/P AVR (aortic valve replacement) 07/13/2013   Rash of back, contact dermatitis resolved 05/31/2013   Bursitis of elbow 05/20/2013   S/P AVR, 05/13/13, 25 mm Edwards Magna-Ease pericardial valve 05/13/2013   Severe aortic stenosis 03/12/2013   Hyperlipidemia 03/12/2013   Essential hypertension 03/12/2013   Left ventricular diastolic dysfunction, NYHA class 2 03/12/2013    ONSET DATE: script 11/30/23  REFERRING DIAG: R47.01 (ICD-10-CM) - Aphasia R13.12 (ICD-10-CM) - Dysphagia, oropharyngeal phase F80.1 (ICD-10-CM) - Expressive language disorder R26.81 (ICD-10-CM) - Unsteadiness on feet  THERAPY DIAG:  Dysarthria and anarthria  Dysphagia, oropharyngeal phase  Cognitive communication deficit  Rationale for Evaluation and Treatment: Rehabilitation  SUBJECTIVE:  SUBJECTIVE STATEMENT:  I kept on doing them.   Pt accompanied by: self   PERTINENT HISTORY: See above  PAIN:  Are you having pain? Yes: NPRS scale: 1-2/10 Pain location: abdomen Pain description: ache Aggravating factors: sitting still Relieving factors: standing up  FALLS: Has patient fallen in last 6 months?  See PT evaluation for  details  PATIENT GOALS: get myself better  OBJECTIVE:  Note: Objective measures were completed at Evaluation unless otherwise noted.  DIAGNOSTIC FINDINGS:  MBS 12/18/23: Clinical Impression: Zyier Dykema presents with an oropharyngeal swallow that is largely [facility] and without aspiration of any of the liquid or solid barium consistencies tested. He had one instance of flash penetration (PAS 2) above the vocal cords when swallowing thin liquid barium with 13mm barium tablet. Only mild amount of pyriform residuals and trace amount of lingual and vallecular residuals remained s/p initial swallows. Retrograde flow of barium below and through PES was observed with liquid barium residuals in pyriform sinuses. 13mm barium tablet became briefly lodged at thoracic level of esophagus but fully transited with sips of thin liquid barium. No stasis or retrograde movement of barium observed in distal portion of esophagus. SLP spoke with Mr. Bloch and his spouse in lobby after MBS completed and neither had any c/o dysphagia.   Factors that may increase risk of adverse event in presence of aspiration [name] 2021): Factors that may increase risk of adverse event in presence of aspiration [name] 2021): Reduced cognitive function   Recommendations/Plan: Swallowing Evaluation Recommendations Swallowing Evaluation Recommendations Recommendations: PO diet PO Diet Recommendation: Regular; Thin liquids (Level 0) Liquid Administration via: Cup; Straw Medication Administration: Whole meds with liquid Supervision: Patient able to self-feed Swallowing strategies  : Slow rate; Small bites/sips Postural changes: Position pt fully upright for meals Oral care recommendations: Oral care BID (2x/day)   MRI brain 02/16/23 IMPRESSION: This MRI of the brain without contrast shows the following: Mild to moderate generalized cortical atrophy without a lobar predominance.  The extent is more than  typical for age. Few scattered T2/FLAIR hyperintense foci in the cerebral hemispheres consistent with minimal chronic microvascular ischemic change that would be typical for age No acute findings.    Cognitive Linguistic Quick Test (CLQT)  AGE - 70-89   The Cognitive Linguistic Quick Test was administered to assess the relative status of five cognitive domains: attention, memory, language, executive functioning, and visuospatial skills. Scores from 10 tasks were used to estimate severity ratings (standardized for age groups 18-69 years and 70-89 years) for each domain, a clock drawing task, as well as an overall composite severity rating of cognition.      Task Score Criterion Cut Scores  Personal Facts 8/8 8  Symbol Cancellation 10/12 10  Confrontation Naming 10/10 10  Clock Drawing  9/13 11  Story Retelling 6/10 5  Symbol Trails 10/10 6  Generative Naming 6/9 4  Design Memory 3/6 4  Mazes  4/8 4  Design Generation 6/13 5    Cognitive Domain Composite Score Severity Rating  Attention 160/215 Low-WNL  Memory 128/185 Mild  Executive Function 26/40 WNL  Language 30/37 WNL  Visuospatial Skills 70/105 WNL  Clock Drawing  9/13 Mild  Composite Severity Rating  WNL   Scores indicate that Garrel has difficulty with memory and attention. Clock drawing subtest targets organization, planning, attention, and visuospatial skills. SLP suspects attention and visuospatial skills were difficult for pt on this subtest due to poorly positioned numbers in relation to the  circle, origin of the hands, and inaccurate pointing of hands.    Completed audio recording of patients baseline voice without cueing from SLP: No  PATIENT REPORTED OUTCOME MEASURES (PROM): Cognitive Function: Pt scored himself (with help from wife) 75/140 with lower scores indicating more of an effect of deficits on daily tasks. He scored 5 (no difficulty) with using a clock. 1 (cannot do) was scored with remembering where  things were put away or placed, and remembering a list of 4-5 errands or items without writing them down. 2 (often or a lot - 2-3 times/day was scored with difficulty: getting things organized, doing more than one thing at a time, and trouble: remembering things I was supposed to do, remembering the name of a familiar person, thinking clearly, and had difficulty: with slow thinking, working hard to pay attention or I would make a mistake, making decisions, and planning out steps for a task.                                                                                                                             TREATMENT DATE:  AB - Abdominal breathing  02/07/24: SPEECH: Pt req'd total A for corrections with homework. SLP observed pt using AB at rest 90%, with sentence responses pt produced AB 90%. Homework provided for detailed instructions/processing.   SWALLOW: SLP observed pt as he completed 15 effortful swallows, pt req'd cues for tracking his reps on the tracking sheet. With mod cues pt achieved this. Procedure for effortful was WNL. With [place] pt procedure was WNL with min A not to breath in the middle of the hold. Tracked independently.  02/05/24: Pt will work on AB in speaking next session.  SWALLOWING: SLP guided pt through his dysphagia exercises with rare min A for procedure, and mod cues for tracking exercises correctly using his tracking sheet. SLP encouraged pt to have 120 each over 4 days. SPEECH: Pt entered with homework completed 85%, with what was completed 100% success. SLP assisted pt with corrections and pt needed extra time. He demonstrated AB at rest 70-75%.  01/31/24: Pt returned his PROM with responses coinciding with reduced memory, attention, and processing speed. Score as above. Today SLP worked with pt on processing speed helping him sequence 6-steps in a task. Pt req'd occasional mod cues with the first set of 4 taking almost 10 minutes to complete. Pt agreed  after this that it should not have taken that long to complete. The second set of 4 took pt 5 minutes 25 seconds to complete. Pt read over two sequences of 6 steps prior to working these out, due to SLP pointing out pt was continuing to have errors due to beginning to number steps without  having read all of the steps in the sequence. Homework for sequencing. SLP cued pt to read steps prior to performing.   01/29/24: CLQT completed. PROM provided (cognitive - long). SWALLOW: pt performed WNL with effortful but req'd  mod A with Mendelsohn due to not holding squeeze. When SLP cued pt he demonstrated slow thyroid  return instead of a hold. Pt was independent with exercise at end of task.  SPEECH: SLP worked with pt and his AB today in speech. In sentence responses pt demonstrated 95% AB once cued for tactile cue. In conversation of 5 minutes pt was successful with AB 80%.   01/24/24: Needs CLQT next session. SPEECH: SLP taught pt about AB and the rationale for this. Pt answered yes/no questions and wh questions with 90% success with AB. SWALLOWING: SLP assisted pt with Mendelsohn and effortful. He req'd min A initially for [employer].  01/17/24: SWALLOWING: SLP worked with pt today with Claudette and added this to his HEP for swallowing. Pt was independent after 4 demonstrations. SLP noticed pt was tracking completion for the last two days and provided him an exercise tracking sheet.  SPEECH: Completed CLQT today- results above.  01/15/24: SPEECH: Answered Sharon's question about goals by giving summary of SLP's goals for pt. SLP explained rationale to administer CLQT today. SLP began CLQT - to be completed next session.  SWALLOWING: SLP demonstrated effortful swallow for pt (see pt instructions). Pt performed with initial min cues faded to mod I (handout). Pt to complete effortful swallow 15 reps BID. SLP to provide [facility] next session, in order to maintain swallow strength. SLP to also work on  swallowing with intent along with other compensations from [facility].   12/07/23: n/a  PATIENT EDUCATION: Education details: See treatment date Person educated: Patient Education method: Explanation, Demonstration, Verbal cues, and Handouts Education comprehension: verbalized understanding, returned demonstration, verbal cues required, and needs further education  HOME EXERCISE PROGRAM: AB, and for swallowing    GOALS: Goals reviewed with patient? No  SHORT TERM GOALS: Target date: 02/09/24  Pt will achieve AB at rest 80% of the time in 3 sessions Baseline:01/24/24  Goal status: partially met  2.  Pt will achieve AB 80% in sentence responses with min nonverbal cues in 2 sessions Baseline: 01/29/24 Goal status: met  3.  Pt will complete cognitive eval in first 2 sessions Baseline:  Goal status: met  4.  Pt will complete dysphagia HEP and strategies from MBS on 12/18/23 with rare min A in 3 sessions Baseline: (HEP 02/05/24) Goal status: INITIAL  5.  Pt will tell SLP two memory strategies he could use in 2 sessions Baseline: 01/29/24 Goal status: Met   LONG TERM GOALS: Target date: 03/06/24  Pt will improve PROM compared to initial administration Baseline:  Goal status: INITIAL  2.  Pt will complete dysphagia HEP and strategies rom MBS on 12/18/23 with mod I in 3 sessions Baseline:  Goal status: INITIAL  3.  Pt will achieve AB 80% in 5 minutes simple conversation with min nonverbal cues in 2 sessions Baseline:  Goal status: INITIAL  4.  Pt will use one memory strategy successfully in/between two sessions Baseline:  Goal status: INITIAL  5.  Pt will demo WFL alternating attention between two simple tasks in 3 sessions Baseline:  Goal status: INITIAL  ASSESSMENT:  CLINICAL IMPRESSION: Patient is a 78 y.o. M who was seen today for treatment of cognition, and/or swallowing, and/or dysarthria (decr'd breath support for speech) c/b rough voice intermittently. Pt is  learning to use AB in speech. See treatment date above for today's date for further details on today's session.   OBJECTIVE IMPAIRMENTS: Objective impairments include attention, memory, awareness, voice disorder, and dysphagia. These impairments are limiting  patient from household responsibilities, ADLs/IADLs, effectively communicating at home and in community, and safety when swallowing.Factors affecting potential to achieve goals and functional outcome are ability to learn/carryover information.. Patient will benefit from skilled SLP services to address above impairments and improve overall function.  REHAB POTENTIAL: Good  PLAN:  SLP FREQUENCY: 2x/week  SLP DURATION: 8 weeks   PLANNED INTERVENTIONS: Aspiration precaution training, Pharyngeal strengthening exercises, Diet toleration management , Environmental controls, Trials of upgraded texture/liquids, Cueing hierachy, Cognitive reorganization, Internal/external aids, Oral motor exercises, Functional tasks, SLP instruction and feedback, Compensatory strategies, Patient/family education, [phone] Treatment of speech (30 or 45 min) , and [id number] Treatment of swallowing function    Jilda Kress, CCC-SLP 02/07/2024, 12:48 PM   .oprcslps

## 2024-02-09 ENCOUNTER — Encounter (HOSPITAL_COMMUNITY): Payer: Self-pay | Admitting: Neurological Surgery

## 2024-02-09 ENCOUNTER — Other Ambulatory Visit (HOSPITAL_BASED_OUTPATIENT_CLINIC_OR_DEPARTMENT_OTHER): Payer: Self-pay

## 2024-02-09 DIAGNOSIS — Z125 Encounter for screening for malignant neoplasm of prostate: Secondary | ICD-10-CM | POA: Diagnosis not present

## 2024-02-09 DIAGNOSIS — E7849 Other hyperlipidemia: Secondary | ICD-10-CM | POA: Diagnosis not present

## 2024-02-09 DIAGNOSIS — Z1212 Encounter for screening for malignant neoplasm of rectum: Secondary | ICD-10-CM | POA: Diagnosis not present

## 2024-02-09 MED ORDER — CAPVAXIVE 0.5 ML IM SOSY
0.5000 mL | PREFILLED_SYRINGE | Freq: Once | INTRAMUSCULAR | 0 refills | Status: AC
  Filled 2024-02-09: qty 0.5, 1d supply, fill #0

## 2024-02-11 NOTE — Progress Notes (Unsigned)
 Cardiology Office Note:  .   Date:  02/12/2024  ID:  Drew Andrews, DOB 1945-05-09, MRN [id number] PCP: Tisovec, Richard W, MD  [facility] Providers Cardiologist:  Debby Sor, MD (Inactive)    History of Present Illness: .    Chief Complaint  Patient presents with   Follow-up    Drew Andrews is a 78 y.o. male with history of AVR, CAD, HTN, HLD who presents for follow-up.    History of Present Illness   Drew Andrews is a 78 year old male with bioprosthetic aortic valve replacement and nonobstructive coronary artery disease who presents for follow-up. He is accompanied by his wife. He was previously under the care of Dr. Sor.  He underwent a bioprosthetic aortic valve replacement with a 25 mm Magna Ease in 2015. He has no history of heart attack or stroke. Recent cardiac stress testing in August was normal, and he has an elevated coronary calcium  score of 4318. He is on aspirin  therapy for his heart condition.  He has a history of Parkinson's disease and is currently taking four pills a day for management. He experiences occasional dizziness and lightheadedness, particularly when standing up, which he attributes to his Parkinson's. He also reports a history of leg pain, which has improved slightly with prednisone, but still causes significant discomfort.  He experiences shortness of breath and heavy breathing at rest, which he describes as feeling like his nose is swollen, leading to mouth breathing. He also experiences sinus congestion and 'snot issues.' He has not had a formal evaluation of his sinuses.  He experiences abdominal pain that sometimes radiates to his chest, which he notes began after his Parkinson's diagnosis. He has undergone multiple cardiac evaluations, including echocardiograms and stress tests, which have not indicated a cardiac cause for his symptoms.  He is currently taking carvedilol  6.25 mg BID, lisinopril  40 mg  daily, norvasc  5 mg, and Flomax. His most recent blood pressure reading was 99/63, and he experiences dizziness when standing.          Problem List Aortic stenosis -25 mm Magna Ease AVR 05/13/2013 2. Non-obstructive CAD -CAC 4318  -negative PET MPI 12/27/2023 3. HTN 4. HLD -T chol 122, HDL 42, LDL 62, TG 89, Lpa 217 5. Parkinson's     ROS: All other ROS reviewed and negative. Pertinent positives noted in the HPI.     Studies Reviewed: SABRA       TTE 09/13/2023  1. Left ventricular ejection fraction, by estimation, is 60 to 65%. The  left ventricle has normal function. The left ventricle has no regional  wall motion abnormalities. Left ventricular diastolic parameters are  consistent with Grade I diastolic  dysfunction (impaired relaxation).   2. Right ventricular systolic function is normal. The right ventricular  size is mildly enlarged.   3. The mitral valve is degenerative. Trivial mitral valve regurgitation.  No evidence of mitral stenosis. Moderate mitral annular calcification.   4. The aortic valve has been repaired/replaced. Aortic valve  regurgitation is not visualized. No aortic stenosis is present. There is a  25 mm [employer] valve present in the aortic position. Echo  findings are consistent with normal structure and   function of the aortic valve prosthesis. Aortic valve mean gradient  measures 7.8 mmHg. Aortic valve Vmax measures 1.88 m/s.   5. Aortic dilatation noted. There is mild dilatation of the ascending  aorta, measuring 40 mm.   6. The inferior vena cava  is normal in size with greater than 50%  respiratory variability, suggesting right atrial pressure of 3 mmHg.  Physical Exam:   VS:  BP 99/63 (BP Location: Left Arm, Patient Position: Sitting, Cuff Size: Normal)   Pulse 69   Ht 5' 9.5 (1.765 m)   Wt 178 lb (80.7 kg)   SpO2 96%   BMI 25.91 kg/m    Wt Readings from Last 3 Encounters:  02/12/24 178 lb (80.7 kg)  10/23/23 176 lb (79.8 kg)   09/14/23 172 lb (78 kg)    GEN: Well nourished, well developed in no acute distress NECK: No JVD; No carotid bruits CARDIAC: RRR, no murmurs, rubs, gallops RESPIRATORY:  Clear to auscultation without rales, wheezing or rhonchi  ABDOMEN: Soft, non-tender, non-distended EXTREMITIES:  No edema; No deformity  ASSESSMENT AND PLAN: .   Assessment and Plan    Nonobstructive coronary artery disease with elevated coronary calcium  score Nonobstructive coronary artery disease with elevated coronary calcium  score of 4318. Normal cardiac stress test in August. No history of myocardial infarction or stroke. - Continue aspirin  81 mg daily.  Status post bioprosthetic aortic valve replacement, well-controlled Bioprosthetic aortic valve replacement with 25 mm Magna Ease valve in 2015. Valve functioning well, no significant gradients. Aorta measures 40 mm, no further evaluation needed. - Continue aspirin  therapy.  Hypertension, currently overtreated with low blood pressure Hypertension overtreated, blood pressure 99/63 mmHg. Low blood pressure may cause dizziness and lightheadedness, especially with Parkinson's disease. On carvedilol , lisinopril , and amlodipine . - Stop amlodipine . - Continue carvedilol  6.25 mg BID. - Continue lisinopril  40 mg daily. - Check blood pressure twice a day. - Monitor blood pressure and report if consistently less than 100 mmHg systolic. - discussed possible interaction with flomax.   Hyperlipidemia with elevated lipoprotein(a), at LDL goal on therapy Hyperlipidemia with elevated lipoprotein(a). On Crestor  40 mg daily and Zetia  10 mg daily. LDL cholesterol at goal, 60 mg/dL.  Chronic nasal congestion and upper airway symptoms Chronic nasal congestion and upper airway symptoms, likely sinus-related. Lungs clear, suggesting upper airway issue. - Recommend evaluation by ENT specialist for nasal and sinus issues.  Abdominal pain Intermittent abdominal pain radiating to  chest, not cardiac in origin. Previous cardiac evaluations normal. Likely gastrointestinal or non-cardiac cause. - Discuss abdominal pain with primary care physician for further evaluation.              Follow-up: Return in about 1 year (around 02/11/2025).   Signed, Darryle DASEN. Barbaraann, MD, [facility]  [facility]  [address] [place], [place] [address] [phone]  5:40 PM

## 2024-02-12 ENCOUNTER — Ambulatory Visit

## 2024-02-12 ENCOUNTER — Encounter (HOSPITAL_COMMUNITY): Payer: Self-pay

## 2024-02-12 ENCOUNTER — Ambulatory Visit: Attending: Cardiovascular Disease | Admitting: Cardiovascular Disease

## 2024-02-12 ENCOUNTER — Encounter: Payer: Self-pay | Admitting: Cardiovascular Disease

## 2024-02-12 VITALS — BP 99/63 | HR 69 | Ht 69.5 in | Wt 178.0 lb

## 2024-02-12 DIAGNOSIS — R471 Dysarthria and anarthria: Secondary | ICD-10-CM

## 2024-02-12 DIAGNOSIS — E7841 Elevated Lipoprotein(a): Secondary | ICD-10-CM

## 2024-02-12 DIAGNOSIS — E7800 Pure hypercholesterolemia, unspecified: Secondary | ICD-10-CM

## 2024-02-12 DIAGNOSIS — R41841 Cognitive communication deficit: Secondary | ICD-10-CM

## 2024-02-12 DIAGNOSIS — R0602 Shortness of breath: Secondary | ICD-10-CM | POA: Diagnosis not present

## 2024-02-12 DIAGNOSIS — R1312 Dysphagia, oropharyngeal phase: Secondary | ICD-10-CM

## 2024-02-12 DIAGNOSIS — I2510 Atherosclerotic heart disease of native coronary artery without angina pectoris: Secondary | ICD-10-CM

## 2024-02-12 DIAGNOSIS — Z952 Presence of prosthetic heart valve: Secondary | ICD-10-CM

## 2024-02-12 NOTE — Patient Instructions (Signed)
 Medication Instructions:  STOP Amlodipine   *If you need a refill on your cardiac medications before your next appointment, please call your pharmacy*  Lab Work: None ordered today. If you have labs (blood work) drawn today and your tests are completely normal, you will receive your results only by: MyChart Message (if you have MyChart) OR A paper copy in the mail If you have any lab test that is abnormal or we need to change your treatment, we will call you to review the results.  Testing/Procedures: None ordered today.  Follow-Up: At [facility], you and your health needs are our priority.  As part of our continuing mission to provide you with exceptional heart care, our providers are all part of one team.  This team includes your primary Cardiologist (physician) and Advanced Practice Providers or APPs (Physician Assistants and Nurse Practitioners) who all work together to provide you with the care you need, when you need it.  Your next appointment:   1 year(s)  Provider:   APP  We recommend signing up for the patient portal called MyChart.  Sign up information is provided on this After Visit Summary.  MyChart is used to connect with patients for Virtual Visits (Telemedicine).  Patients are able to view lab/test results, encounter notes, upcoming appointments, etc.  Non-urgent messages can be sent to your provider as well.   To learn more about what you can do with MyChart, go to [email or web address].   Other Instructions Check blood pressure readings twice daily. The top number of your blood pressure (systolic blood pressure) should be between 100-130 mmHg.

## 2024-02-12 NOTE — Therapy (Unsigned)
 OUTPATIENT SPEECH LANGUAGE PATHOLOGY [facility]AT[facility]   Patient Name: Drew Andrews MRN: [id number] DOB:09-10-1945, 78 y.o., male Today's Date: 02/13/2024  PCP: Drew Ade, MD REFERRING PROVIDER: Onita Duos, MD  END OF SESSION:  End of Session - 02/13/24 0809     Visit Number 7    Number of Visits 17    Date for Recertification  03/06/24    SLP Start Time 1240   9 minutes late   SLP Stop Time  1315    SLP Time Calculation (min) 35 min    Activity Tolerance Patient tolerated treatment well             Past Medical History:  Diagnosis Date   Ascending aorta dilation 09/14/2023   TTE 09/13/2023: EF 60-65, no RWMA, GR 1 DD, normal RVSF, trivial MR, moderate MAC, AVR with normal structure and function, mild dilation of ascending aorta (40 mm)    Bursitis of elbow 05/20/2013   Coronary artery disease    Nonobstructive   Essential hypertension 03/12/2013   GERD (gastroesophageal reflux disease)    Heart murmur    s/p  aortic valve replacement surgery by Dr. Dorise Andrews 05/13/2013   Hyperlipidemia    Hypertension    Left ventricular diastolic dysfunction, NYHA class 2 03/12/2013   LVH (left ventricular hypertrophy)    with aortic stenosis-bicuspid   Parkinson's disease (HCC)    PONV (postoperative nausea and vomiting)    as a child   S/P AVR (aortic valve replacement) 05/13/2013   S/P AVR, 05/13/13, 25 mm Edwards Magna-Ease pericardial valve 05/13/2013   Seasonal allergies    Severe aortic stenosis 03/12/2013   Past Surgical History:  Procedure Laterality Date   AORTIC VALVE REPLACEMENT N/A 05/13/2013   Procedure: AORTIC VALVE REPLACEMENT (AVR);  Surgeon: Drew MARLA Fellers, MD;  Location: [facility] OR;  Service: Open Heart Surgery;  Laterality: N/A;   APPENDECTOMY  age 22   CARDIAC CATHETERIZATION     COLONOSCOPY     X 2   HIATAL HERNIA REPAIR     INTRAOPERATIVE TRANSESOPHAGEAL ECHOCARDIOGRAM N/A 05/13/2013   Procedure: INTRAOPERATIVE TRANSESOPHAGEAL  ECHOCARDIOGRAM;  Surgeon: Drew MARLA Fellers, MD;  Location: [facility] OR;  Service: Open Heart Surgery;  Laterality: N/A;   LEFT HEART CATHETERIZATION WITH CORONARY ANGIOGRAM N/A 03/15/2013   Procedure: LEFT HEART CATHETERIZATION WITH CORONARY ANGIOGRAM;  Surgeon: Drew DELENA Sor, MD;  Location: [facility] CATH LAB;  Service: Cardiovascular;  Laterality: N/A;   TONSILLECTOMY     TRANSTHORACIC ECHOCARDIOGRAM  03/04/2013   EF 55-60%, grade 2 diastolic dysfunction, AV with mod calcified leaflets & mild regurg, calcified MV annulus, LA mod dilated, RV mildly dilated   Patient Active Problem List   Diagnosis Date Noted   Ascending aorta dilation 09/14/2023   CAD (coronary artery disease) 08/09/2023   Low back pain with sciatica 07/07/2023   Anxiety 04/17/2023   Cognitive impairment 01/12/2023   Parkinson's disease without dyskinesia or fluctuating manifestations (HCC) 01/12/2023   GERD (gastroesophageal reflux disease) 11/15/2015   Diplopia 02/21/2014   S/P AVR (aortic valve replacement) 07/13/2013   Rash of back, contact dermatitis resolved 05/31/2013   Bursitis of elbow 05/20/2013   S/P AVR, 05/13/13, 25 mm Edwards Magna-Ease pericardial valve 05/13/2013   Severe aortic stenosis 03/12/2013   Hyperlipidemia 03/12/2013   Essential hypertension 03/12/2013   Left ventricular diastolic dysfunction, NYHA class 2 03/12/2013    ONSET DATE: script 11/30/23  REFERRING DIAG: R47.01 (ICD-10-CM) - Aphasia R13.12 (ICD-10-CM) - Dysphagia, oropharyngeal phase F80.1 (ICD-10-CM) -  Expressive language disorder R26.81 (ICD-10-CM) - Unsteadiness on feet  THERAPY DIAG:  Dysarthria and anarthria  Dysphagia, oropharyngeal phase  Cognitive communication deficit  Rationale for Evaluation and Treatment: Rehabilitation  SUBJECTIVE:   SUBJECTIVE STATEMENT:  I did it yesterday, double checked it, and now I can't find the thing.   Pt accompanied by: self   PERTINENT HISTORY: See above  PAIN:  Are you having pain? Yes:  NPRS scale: 2/10 Pain location: abdomen Pain description: ache Aggravating factors: sitting still Relieving factors: standing up  FALLS: Has patient fallen in last 6 months?  See PT evaluation for details  PATIENT GOALS: get myself better  OBJECTIVE:  Note: Objective measures were completed at Evaluation unless otherwise noted.  DIAGNOSTIC FINDINGS:  MBS 12/18/23: Clinical Impression: Drew Andrews presents with an oropharyngeal swallow that is largely [facility] and without aspiration of any of the liquid or solid barium consistencies tested. He had one instance of flash penetration (PAS 2) above the vocal cords when swallowing thin liquid barium with 13mm barium tablet. Only mild amount of pyriform residuals and trace amount of lingual and vallecular residuals remained s/p initial swallows. Retrograde flow of barium below and through PES was observed with liquid barium residuals in pyriform sinuses. 13mm barium tablet became briefly lodged at thoracic level of esophagus but fully transited with sips of thin liquid barium. No stasis or retrograde movement of barium observed in distal portion of esophagus. SLP spoke with Drew Andrews and his spouse in lobby after MBS completed and neither had any c/o dysphagia.   Factors that may increase risk of adverse event in presence of aspiration Drew Andrews): Factors that may increase risk of adverse event in presence of aspiration Drew Andrews): Reduced cognitive function   Recommendations/Plan: Swallowing Evaluation Recommendations Swallowing Evaluation Recommendations Recommendations: PO diet PO Diet Recommendation: Regular; Thin liquids (Level 0) Liquid Administration via: Cup; Straw Medication Administration: Whole meds with liquid Supervision: Patient able to self-feed Swallowing strategies  : Slow rate; Small bites/sips Postural changes: Position pt fully upright for meals Oral care recommendations: Oral care BID  (2x/day)   MRI brain 02/16/23 IMPRESSION: This MRI of the brain without contrast shows the following: Mild to moderate generalized cortical atrophy without a lobar predominance.  The extent is more than typical for age. Few scattered T2/FLAIR hyperintense foci in the cerebral hemispheres consistent with minimal chronic microvascular ischemic change that would be typical for age No acute findings.    Cognitive Linguistic Quick Test (CLQT)  AGE - 70-89   The Cognitive Linguistic Quick Test was administered to assess the relative status of five cognitive domains: attention, memory, language, executive functioning, and visuospatial skills. Scores from 10 tasks were used to estimate severity ratings (standardized for age groups 18-69 years and 70-89 years) for each domain, a clock drawing task, as well as an overall composite severity rating of cognition.      Task Score Criterion Cut Scores  Personal Facts 8/8 8  Symbol Cancellation 10/12 10  Confrontation Naming 10/10 10  Clock Drawing  9/13 11  Story Retelling 6/10 5  Symbol Trails 10/10 6  Generative Naming 6/9 4  Design Memory 3/6 4  Mazes  4/8 4  Design Generation 6/13 5    Cognitive Domain Composite Score Severity Rating  Attention 160/215 Low-WNL  Memory 128/185 Mild  Executive Function 26/40 WNL  Language 30/37 WNL  Visuospatial Skills 70/105 WNL  Clock Drawing  9/13 Mild  Composite Severity Rating  WNL  Scores indicate that Garrel has difficulty with memory and attention. Clock drawing subtest targets organization, planning, attention, and visuospatial skills. SLP suspects attention and visuospatial skills were difficult for pt on this subtest due to poorly positioned numbers in relation to the circle, origin of the hands, and inaccurate pointing of hands.    Completed audio recording of patients baseline voice without cueing from SLP: No  PATIENT REPORTED OUTCOME MEASURES (PROM): Cognitive Function: Pt scored himself  (with help from wife) 75/140 with lower scores indicating more of an effect of deficits on daily tasks. He scored 5 (no difficulty) with using a clock. 1 (cannot do) was scored with remembering where things were put away or placed, and remembering a list of 4-5 errands or items without writing them down. 2 (often or a lot - 2-3 times/day was scored with difficulty: getting things organized, doing more than one thing at a time, and trouble: remembering things I was supposed to do, remembering the name of a familiar person, thinking clearly, and had difficulty: with slow thinking, working hard to pay attention or I would make a mistake, making decisions, and planning out steps for a task.                                                                                                                             TREATMENT DATE:  AB - Abdominal breathing  02/12/24: 9 minutes late today. SPEECH: After much searching in his folder for homework mentioned in s, pt and SLP agreed that pt completed computations for his homework on other papers and did not transfer answers to the handout. Pt thought SLP provided the handout two-three sessions ago because of this. SLP used max cues for correlating handout with pt's written work. Pt's temporal orientation was not good today- (day of the week); Max A necessary from SLP.  SWALLOW: SLP checked pt's swallow HEP. Pt was not performing Mendelsohn correctly (no squeeze). SLP provided cues so that pt was performing correctly at end of session. SLP WILL NEED TO VERIFY CORRECT PROCEDURE for Mendelsohn next session.  02/07/24: SPEECH: Pt req'd total A for corrections with homework. SLP observed pt using AB at rest 90%, with sentence responses pt produced AB 90%. Homework provided for detailed instructions/processing.   SWALLOW: SLP observed pt as he completed 15 effortful swallows, pt req'd cues for tracking his reps on the tracking sheet. With mod cues pt achieved  this. Procedure for effortful was WNL. With [place] pt procedure was WNL with min A not to breath in the middle of the hold. Tracked independently.  02/05/24: Pt will work on AB in speaking next session.  SWALLOWING: SLP guided pt through his dysphagia exercises with rare min A for procedure, and mod cues for tracking exercises correctly using his tracking sheet. SLP encouraged pt to have 120 each over 4 days. SPEECH: Pt entered with homework completed 85%, with what was completed 100% success. SLP assisted pt with corrections  and pt needed extra time. He demonstrated AB at rest 70-75%.  01/31/24: Pt returned his PROM with responses coinciding with reduced memory, attention, and processing speed. Score as above. Today SLP worked with pt on processing speed helping him sequence 6-steps in a task. Pt req'd occasional mod cues with the first set of 4 taking almost 10 minutes to complete. Pt agreed after this that it should not have taken that long to complete. The second set of 4 took pt 5 minutes 25 seconds to complete. Pt read over two sequences of 6 steps prior to working these out, due to SLP pointing out pt was continuing to have errors due to beginning to number steps without  having read all of the steps in the sequence. Homework for sequencing. SLP cued pt to read steps prior to performing.   01/29/24: CLQT completed. PROM provided (cognitive - long). SWALLOW: pt performed WNL with effortful but req'd mod A with Mendelsohn due to not holding squeeze. When SLP cued pt he demonstrated slow thyroid  return instead of a hold. Pt was independent with exercise at end of task.  SPEECH: SLP worked with pt and his AB today in speech. In sentence responses pt demonstrated 95% AB once cued for tactile cue. In conversation of 5 minutes pt was successful with AB 80%.   01/24/24: Needs CLQT next session. SPEECH: SLP taught pt about AB and the rationale for this. Pt answered yes/no questions and wh questions with  90% success with AB. SWALLOWING: SLP assisted pt with Mendelsohn and effortful. He req'd min A initially for [employer].  01/17/24: SWALLOWING: SLP worked with pt today with [place] and added this to his HEP for swallowing. Pt was independent after 4 demonstrations. SLP noticed pt was tracking completion for the last two days and provided him an exercise tracking sheet.  SPEECH: Completed CLQT today- results above.  01/15/24: SPEECH: Answered Sharon's question about goals by giving summary of SLP's goals for pt. SLP explained rationale to administer CLQT today. SLP began CLQT - to be completed next session.  SWALLOWING: SLP demonstrated effortful swallow for pt (see pt instructions). Pt performed with initial min cues faded to mod I (handout). Pt to complete effortful swallow 15 reps BID. SLP to provide [facility] next session, in order to maintain swallow strength. SLP to also work on swallowing with intent along with other compensations from [facility].   12/07/23: n/a  PATIENT EDUCATION: Education details: See treatment date Person educated: Patient Education method: Explanation, Demonstration, Verbal cues, and Handouts Education comprehension: verbalized understanding, returned demonstration, verbal cues required, and needs further education  HOME EXERCISE PROGRAM: AB, and for swallowing    GOALS: Goals reviewed with patient? No  SHORT TERM GOALS: Target date: 02/09/24  Pt will achieve AB at rest 80% of the time in 3 sessions Baseline:01/24/24  Goal status: partially met  2.  Pt will achieve AB 80% in sentence responses with min nonverbal cues in 2 sessions Baseline: 01/29/24 Goal status: met  3.  Pt will complete cognitive eval in first 2 sessions Baseline:  Goal status: met  4.  Pt will complete dysphagia HEP and strategies from MBS on 12/18/23 with rare min A in 3 sessions Baseline: (HEP 02/05/24) Goal status: INITIAL  5.  Pt will tell SLP two memory strategies he could use  in 2 sessions Baseline: 01/29/24 Goal status: Met   LONG TERM GOALS: Target date: 03/06/24  Pt will improve PROM compared to initial administration Baseline:  Goal status: INITIAL  2.  Pt will complete dysphagia HEP and strategies rom MBS on 12/18/23 with mod I in 3 sessions Baseline:  Goal status: INITIAL  3.  Pt will achieve AB 80% in 5 minutes simple conversation with min nonverbal cues in 2 sessions Baseline:  Goal status: INITIAL  4.  Pt will use one memory strategy successfully in/between two sessions Baseline:  Goal status: INITIAL  5.  Pt will demo WFL alternating attention between two simple tasks in 3 sessions Baseline:  Goal status: INITIAL  ASSESSMENT:  CLINICAL IMPRESSION: Patient is a 78 y.o. M who was seen today for treatment of cognition, and/or swallowing, and/or dysarthria (decr'd breath support for speech) c/b rough voice intermittently. Today pt demonstrated s/sx of decr'd cognitive function. See treatment date above for today's date for further details on today's session. Pt is learning to use AB in speech.   OBJECTIVE IMPAIRMENTS: Objective impairments include attention, memory, awareness, voice disorder, and dysphagia. These impairments are limiting patient from household responsibilities, ADLs/IADLs, effectively communicating at home and in community, and safety when swallowing.Factors affecting potential to achieve goals and functional outcome are ability to learn/carryover information.. Patient will benefit from skilled SLP services to address above impairments and improve overall function.  REHAB POTENTIAL: Good  PLAN:  SLP FREQUENCY: 2x/week  SLP DURATION: 8 weeks   PLANNED INTERVENTIONS: Aspiration precaution training, Pharyngeal strengthening exercises, Diet toleration management , Environmental controls, Trials of upgraded texture/liquids, Cueing hierachy, Cognitive reorganization, Internal/external aids, Oral motor exercises, Functional  tasks, SLP instruction and feedback, Compensatory strategies, Patient/family education, [phone] Treatment of speech (30 or 45 min) , and [id number] Treatment of swallowing function    Cay Kath, CCC-SLP 02/13/2024, 8:10 AM   .oprcslps

## 2024-02-12 NOTE — Pre-Procedure Instructions (Signed)
 Surgical Instructions   Your procedure is scheduled on February 19, 2024. Report to [facility] Main Entrance A at 7:30 A.M., then check in with the Admitting office. Any questions or running late day of surgery: call [phone]  Questions prior to your surgery date: call [phone], Monday-Friday, 8am-4pm. If you experience any cold or flu symptoms such as cough, fever, chills, shortness of breath, etc. between now and your scheduled surgery, please notify us  at the above number.     Remember:  Do not eat or drink after midnight the night before your surgery    Take these medicines the morning of surgery with A SIP OF WATER: amLODipine  (NORVASC )  carbidopa -levodopa  (SINEMET  IR)  carvedilol  (COREG )  omeprazole (PRILOSEC OTC)    May take these medicines IF NEEDED: oxyCODONE -acetaminophen  (PERCOCET/ROXICET)  Tetrahydrozoline-Zn Sulfate (ALLERGY RELIEF EYE DROPS OP) eye drops   STOP taking your aspirin  one week prior to surgery. Your last dose will be October 12th.   One week prior to surgery, STOP taking any Aleve, Naproxen, Ibuprofen, Motrin, Advil, Goody's, BC's, all herbal medications, fish oil, and non-prescription vitamins.                     Do NOT Smoke (Tobacco/Vaping) for 24 hours prior to your procedure.  If you use a CPAP at night, you may bring your mask/headgear for your overnight stay.   You will be asked to remove any contacts, glasses, piercing's, hearing aid's, dentures/partials prior to surgery. Please bring cases for these items if needed.    Patients discharged the day of surgery will not be allowed to drive home, and someone needs to stay with them for 24 hours.  SURGICAL WAITING ROOM VISITATION Patients may have no more than 2 support people in the waiting area - these visitors may rotate.   Pre-op nurse will coordinate an appropriate time for 1 ADULT support person, who may not rotate, to accompany patient in pre-op.  Children under the age of 63  must have an adult with them who is not the patient and must remain in the main waiting area with an adult.  If the patient needs to stay at the hospital during part of their recovery, the visitor guidelines for inpatient rooms apply.  Please refer to the [facility] website for the visitor guidelines for any additional information.   If you received a COVID test during your pre-op visit  it is requested that you wear a mask when out in public, stay away from anyone that may not be feeling well and notify your surgeon if you develop symptoms. If you have been in contact with anyone that has tested positive in the last 10 days please notify you surgeon.      Pre-operative 4 CHG Bathing Instructions   You can play a key role in reducing the risk of infection after surgery. Your skin needs to be as free of germs as possible. You can reduce the number of germs on your skin by washing with CHG (chlorhexidine  gluconate) soap before surgery. CHG is an antiseptic soap that kills germs and continues to kill germs even after washing.   DO NOT use if you have an allergy to chlorhexidine /CHG or antibacterial soaps. If your skin becomes reddened or irritated, stop using the CHG and notify one of our RNs at [phone].   Please shower with the CHG soap starting 4 days before surgery using the following schedule:     Please keep in mind the  following:  DO NOT shave, including legs and underarms, starting the day of your first shower.   You may shave your face at any point before/day of surgery.  Place clean sheets on your bed the day you start using CHG soap. Use a clean washcloth (not used since being washed) for each shower. DO NOT sleep with pets once you start using the CHG.   CHG Shower Instructions:  Wash your face and private area with normal soap. If you choose to wash your hair, wash first with your normal shampoo.  After you use shampoo/soap, rinse your hair and body thoroughly to remove  shampoo/soap residue.  Turn the water OFF and apply  bottle of CHG soap to a CLEAN washcloth.  Apply CHG soap ONLY FROM YOUR NECK DOWN TO YOUR TOES (washing for 3-5 minutes)  DO NOT use CHG soap on face, private areas, open wounds, or sores.  Pay special attention to the area where your surgery is being performed.  If you are having back surgery, having someone wash your back for you may be helpful. Wait 2 minutes after CHG soap is applied, then you may rinse off the CHG soap.  Pat dry with a clean towel  Put on clean clothes/pajamas   If you choose to wear lotion, please use ONLY the CHG-compatible lotions that are listed below.  Additional instructions for the day of surgery:  If you choose, you may shower the morning of surgery with an antibacterial soap.  DO NOT APPLY any lotions, deodorants, cologne, or perfumes.   Do not bring valuables to the hospital. [facility] is not responsible for any belongings/valuables. Do not wear nail polish, gel polish, artificial nails, or any other type of covering on natural nails (fingers and toes) Do not wear jewelry or makeup Put on clean/comfortable clothes.  Please brush your teeth.  Ask your nurse before applying any prescription medications to the skin.     CHG Compatible Lotions   Aveeno Moisturizing lotion  Cetaphil Moisturizing Cream  Cetaphil Moisturizing Lotion  Clairol Herbal Essence Moisturizing Lotion, Dry Skin  Clairol Herbal Essence Moisturizing Lotion, Extra Dry Skin  Clairol Herbal Essence Moisturizing Lotion, Normal Skin  Curel Age Defying Therapeutic Moisturizing Lotion with Alpha Hydroxy  Curel Extreme Care Body Lotion  Curel Soothing Hands Moisturizing Hand Lotion  Curel Therapeutic Moisturizing Cream, Fragrance-Free  Curel Therapeutic Moisturizing Lotion, Fragrance-Free  Curel Therapeutic Moisturizing Lotion, Original Formula  Eucerin Daily Replenishing Lotion  Eucerin Dry Skin Therapy Plus Alpha Hydroxy Crme   Eucerin Dry Skin Therapy Plus Alpha Hydroxy Lotion  Eucerin Original Crme  Eucerin Original Lotion  Eucerin Plus Crme Eucerin Plus Lotion  Eucerin TriLipid Replenishing Lotion  Keri Anti-Bacterial Hand Lotion  Keri Deep Conditioning Original Lotion Dry Skin Formula Softly Scented  Keri Deep Conditioning Original Lotion, Fragrance Free Sensitive Skin Formula  Keri Lotion Fast Absorbing Fragrance Free Sensitive Skin Formula  Keri Lotion Fast Absorbing Softly Scented Dry Skin Formula  Keri Original Lotion  Keri Skin Renewal Lotion Keri Silky Smooth Lotion  Keri Silky Smooth Sensitive Skin Lotion  Nivea Body Creamy Conditioning Oil  Nivea Body Extra Enriched Lotion  Nivea Body Original Lotion  Nivea Body Sheer Moisturizing Lotion Nivea Crme  Nivea Skin Firming Lotion  NutraDerm 30 Skin Lotion  NutraDerm Skin Lotion  NutraDerm Therapeutic Skin Cream  NutraDerm Therapeutic Skin Lotion  ProShield Protective Hand Cream  Provon moisturizing lotion  Please read over the following fact sheets that you were given.

## 2024-02-13 ENCOUNTER — Other Ambulatory Visit: Payer: Self-pay

## 2024-02-13 ENCOUNTER — Encounter (HOSPITAL_COMMUNITY): Payer: Self-pay

## 2024-02-13 ENCOUNTER — Encounter (HOSPITAL_COMMUNITY)
Admission: RE | Admit: 2024-02-13 | Discharge: 2024-02-13 | Disposition: A | Discharge status: Home / Self Care | Source: Ambulatory Visit | Attending: Neurological Surgery | Admitting: Neurological Surgery

## 2024-02-13 VITALS — BP 129/82 | HR 61 | Temp 98.2°F | Resp 18 | Ht 69.0 in | Wt 177.0 lb

## 2024-02-13 DIAGNOSIS — D696 Thrombocytopenia, unspecified: Secondary | ICD-10-CM | POA: Diagnosis not present

## 2024-02-13 DIAGNOSIS — D649 Anemia, unspecified: Secondary | ICD-10-CM | POA: Insufficient documentation

## 2024-02-13 DIAGNOSIS — Z952 Presence of prosthetic heart valve: Secondary | ICD-10-CM | POA: Insufficient documentation

## 2024-02-13 DIAGNOSIS — Z01818 Encounter for other preprocedural examination: Secondary | ICD-10-CM

## 2024-02-13 DIAGNOSIS — I3481 Nonrheumatic mitral (valve) annulus calcification: Secondary | ICD-10-CM | POA: Insufficient documentation

## 2024-02-13 DIAGNOSIS — G20A1 Parkinson's disease without dyskinesia, without mention of fluctuations: Secondary | ICD-10-CM | POA: Insufficient documentation

## 2024-02-13 DIAGNOSIS — Z01812 Encounter for preprocedural laboratory examination: Secondary | ICD-10-CM | POA: Insufficient documentation

## 2024-02-13 DIAGNOSIS — Z79899 Other long term (current) drug therapy: Secondary | ICD-10-CM | POA: Insufficient documentation

## 2024-02-13 DIAGNOSIS — I77810 Thoracic aortic ectasia: Secondary | ICD-10-CM | POA: Diagnosis not present

## 2024-02-13 DIAGNOSIS — I2510 Atherosclerotic heart disease of native coronary artery without angina pectoris: Secondary | ICD-10-CM | POA: Diagnosis not present

## 2024-02-13 DIAGNOSIS — K219 Gastro-esophageal reflux disease without esophagitis: Secondary | ICD-10-CM | POA: Insufficient documentation

## 2024-02-13 DIAGNOSIS — I10 Essential (primary) hypertension: Secondary | ICD-10-CM | POA: Diagnosis not present

## 2024-02-13 DIAGNOSIS — F1090 Alcohol use, unspecified, uncomplicated: Secondary | ICD-10-CM

## 2024-02-13 HISTORY — DX: Myoneural disorder, unspecified: G70.9

## 2024-02-13 HISTORY — DX: Depression, unspecified: F32.A

## 2024-02-13 HISTORY — DX: Unspecified osteoarthritis, unspecified site: M19.90

## 2024-02-13 LAB — TYPE AND SCREEN
ABO/RH(D): A POS
Antibody Screen: NEGATIVE

## 2024-02-13 LAB — COMPREHENSIVE METABOLIC PANEL WITH GFR
ALT: 10 U/L (ref 0–44)
AST: 20 U/L (ref 15–41)
Albumin: 4.4 g/dL (ref 3.5–5.0)
Alkaline Phosphatase: 38 U/L (ref 38–126)
Anion gap: 12 (ref 5–15)
BUN: 18 mg/dL (ref 8–23)
CO2: 22 mmol/L (ref 22–32)
Calcium: 9 mg/dL (ref 8.9–10.3)
Chloride: 102 mmol/L (ref 98–111)
Creatinine, Ser: 1.12 mg/dL (ref 0.61–1.24)
GFR, Estimated: >60 mL/min (ref >60)
Glucose, Bld: 93 mg/dL (ref 70–99)
Potassium: 4.2 mmol/L (ref 3.5–5.1)
Sodium: 136 mmol/L (ref 135–145)
Total Bilirubin: 0.6 mg/dL (ref 0.0–1.2)
Total Protein: 7.1 g/dL (ref 6.5–8.1)

## 2024-02-13 LAB — CBC
HCT: 37.6 % — ABNORMAL LOW (ref 39.0–52.0)
Hemoglobin: 12.9 g/dL — ABNORMAL LOW (ref 13.0–17.0)
MCH: 35.3 pg — ABNORMAL HIGH (ref 26.0–34.0)
MCHC: 34.3 g/dL (ref 30.0–36.0)
MCV: 103 fL — ABNORMAL HIGH (ref 80.0–100.0)
Platelets: 137 K/uL — ABNORMAL LOW (ref 150–400)
RBC: 3.65 MIL/uL — ABNORMAL LOW (ref 4.22–5.81)
RDW: 12 % (ref 11.5–15.5)
WBC: 6.8 K/uL (ref 4.0–10.5)
nRBC: 0 % (ref 0.0–0.2)

## 2024-02-13 LAB — SURGICAL PCR SCREEN
MRSA, PCR: NEGATIVE
Staphylococcus aureus: NEGATIVE

## 2024-02-13 NOTE — Pre-Procedure Instructions (Signed)
 Surgical Instructions     Your procedure is scheduled on February 19, 2024.  Report to [facility] Main Entrance A at 7:30 A.M., then check in with the Admitting office. Any questions or running late day of surgery: call [phone]   Questions prior to your surgery date: call [phone], Monday-Friday, 8am-4pm. If you experience any cold or flu symptoms such as cough, fever, chills, shortness of breath, etc. between now and your scheduled surgery, please notify us  at the above number.            Remember:       Do not eat or drink after midnight the night before your surgery          Take these medicines the morning of surgery with A SIP OF WATER:  carbidopa -levodopa  (SINEMET  IR)  carvedilol  (COREG )  omeprazole (PRILOSEC OTC)      May take these medicines IF NEEDED:  oxyCODONE -acetaminophen  (PERCOCET/ROXICET)  Tetrahydrozoline-Zn Sulfate (ALLERGY RELIEF EYE DROPS OP) eye drops     STOP taking your aspirin  one week prior to surgery. Your last dose will be October 12th.     One week prior to surgery, STOP taking any Aleve, Naproxen, Ibuprofen, Motrin, Advil, Goody's, BC's, all herbal medications, fish oil, and non-prescription vitamins.                     Do NOT Smoke (Tobacco/Vaping) for 24 hours prior to your procedure.   If you use a CPAP at night, you may bring your mask/headgear for your overnight stay.   You will be asked to remove any contacts, glasses, piercing's, hearing aid's, dentures/partials prior to surgery. Please bring cases for these items if needed.    Patients discharged the day of surgery will not be allowed to drive home, and someone needs to stay with them for 24 hours.   SURGICAL WAITING ROOM VISITATION Patients may have no more than 2 support people in the waiting area - these visitors may rotate.   Pre-op nurse will coordinate an appropriate time for 1 ADULT support person, who may not rotate, to accompany patient in pre-op.  Children under  the age of 17 must have an adult with them who is not the patient and must remain in the main waiting area with an adult.   If the patient needs to stay at the hospital during part of their recovery, the visitor guidelines for inpatient rooms apply.   Please refer to the [facility] website for the visitor guidelines for any additional information.     If you received a COVID test during your pre-op visit  it is requested that you wear a mask when out in public, stay away from anyone that may not be feeling well and notify your surgeon if you develop symptoms. If you have been in contact with anyone that has tested positive in the last 10 days please notify you surgeon.         Pre-operative 4 CHG Bathing Instructions    You can play a key role in reducing the risk of infection after surgery. Your skin needs to be as free of germs as possible. You can reduce the number of germs on your skin by washing with CHG (chlorhexidine  gluconate) soap before surgery. CHG is an antiseptic soap that kills germs and continues to kill germs even after washing.    DO NOT use if you have an allergy to chlorhexidine /CHG or antibacterial soaps. If your skin becomes reddened or irritated,  stop using the CHG and notify one of our RNs at [phone].    Please shower with the CHG soap starting 4 days before surgery using the following schedule:       Please keep in mind the following:  DO NOT shave, including legs and underarms, starting the day of your first shower.   You may shave your face at any point before/day of surgery.  Place clean sheets on your bed the day you start using CHG soap. Use a clean washcloth (not used since being washed) for each shower. DO NOT sleep with pets once you start using the CHG.    CHG Shower Instructions:  Wash your face and private area with normal soap. If you choose to wash your hair, wash first with your normal shampoo.  After you use shampoo/soap, rinse your hair and  body thoroughly to remove shampoo/soap residue.  Turn the water OFF and apply  bottle of CHG soap to a CLEAN washcloth.  Apply CHG soap ONLY FROM YOUR NECK DOWN TO YOUR TOES (washing for 3-5 minutes)  DO NOT use CHG soap on face, private areas, open wounds, or sores.  Pay special attention to the area where your surgery is being performed.  If you are having back surgery, having someone wash your back for you may be helpful. Wait 2 minutes after CHG soap is applied, then you may rinse off the CHG soap.  Pat dry with a clean towel  Put on clean clothes/pajamas   If you choose to wear lotion, please use ONLY the CHG-compatible lotions that are listed below.   Additional instructions for the day of surgery:   If you choose, you may shower the morning of surgery with an antibacterial soap.  DO NOT APPLY any lotions, deodorants, cologne, or perfumes.   Do not bring valuables to the hospital. [facility] is not responsible for any belongings/valuables. Do not wear nail polish, gel polish, artificial nails, or any other type of covering on natural nails (fingers and toes) Do not wear jewelry or makeup Put on clean/comfortable clothes.  Please brush your teeth.  Ask your nurse before applying any prescription medications to the skin.        CHG Compatible Lotions    Aveeno Moisturizing lotion  Cetaphil Moisturizing Cream  Cetaphil Moisturizing Lotion  Clairol Herbal Essence Moisturizing Lotion, Dry Skin  Clairol Herbal Essence Moisturizing Lotion, Extra Dry Skin  Clairol Herbal Essence Moisturizing Lotion, Normal Skin  Curel Age Defying Therapeutic Moisturizing Lotion with Alpha Hydroxy  Curel Extreme Care Body Lotion  Curel Soothing Hands Moisturizing Hand Lotion  Curel Therapeutic Moisturizing Cream, Fragrance-Free  Curel Therapeutic Moisturizing Lotion, Fragrance-Free  Curel Therapeutic Moisturizing Lotion, Original Formula  Eucerin Daily Replenishing Lotion  Eucerin Dry Skin  Therapy Plus Alpha Hydroxy Crme  Eucerin Dry Skin Therapy Plus Alpha Hydroxy Lotion  Eucerin Original Crme  Eucerin Original Lotion  Eucerin Plus Crme Eucerin Plus Lotion  Eucerin TriLipid Replenishing Lotion  Keri Anti-Bacterial Hand Lotion  Keri Deep Conditioning Original Lotion Dry Skin Formula Softly Scented  Keri Deep Conditioning Original Lotion, Fragrance Free Sensitive Skin Formula  Keri Lotion Fast Absorbing Fragrance Free Sensitive Skin Formula  Keri Lotion Fast Absorbing Softly Scented Dry Skin Formula  Keri Original Lotion  Keri Skin Renewal Lotion Keri Silky Smooth Lotion  Keri Silky Smooth Sensitive Skin Lotion  Nivea Body Creamy Conditioning Oil  Nivea Body Extra Enriched Arts development officer  Crme  Nivea Skin Firming Lotion  NutraDerm 30 Skin Lotion  NutraDerm Skin Lotion  NutraDerm Therapeutic Skin Cream  NutraDerm Therapeutic Skin Lotion  ProShield Protective Hand Cream  Provon moisturizing lotion   Please read over the following fact sheets that you were given.

## 2024-02-13 NOTE — Progress Notes (Signed)
 PCP - Vernadine Ade, MD Cardiologist - Burnard Ned, MD  PPM/ICD - denies Device Orders - n/a Rep Notified - n/a  Chest x-ray - n/a EKG - 09/14/2023 Stress Test - 12/27/2023 ECHO - 09/13/2023 Cardiac Cath - 03/13/2023  Sleep Study - denies CPAP - n/a  Fasting Blood Sugar - no DM Checks Blood Sugar _____ times a day  Last dose of GLP1 agonist- n/a  GLP1 instructions: n/a  Blood Thinner Instructions: n/a Aspirin  Instructions: hold for 7 days  ERAS Protcol - no PRE-SURGERY Ensure or G2- n/a  COVID TEST- n/a    Anesthesia review: yes, cardiac clearance, CAD, HTN  Patient denies shortness of breath, fever, cough and chest pain at PAT appointment   All instructions explained to the patient, with a verbal understanding of the material. Patient agrees to go over the instructions while at home for a better understanding. Patient also instructed to self quarantine after being tested for COVID-19. The opportunity to ask questions was provided.

## 2024-02-14 ENCOUNTER — Ambulatory Visit

## 2024-02-14 DIAGNOSIS — R41841 Cognitive communication deficit: Secondary | ICD-10-CM

## 2024-02-14 DIAGNOSIS — R471 Dysarthria and anarthria: Secondary | ICD-10-CM

## 2024-02-14 DIAGNOSIS — R1312 Dysphagia, oropharyngeal phase: Secondary | ICD-10-CM | POA: Diagnosis not present

## 2024-02-14 NOTE — Progress Notes (Signed)
 Anesthesia Chart Review:  78 year old male follows with cardiology for history of AVR with bioprosthetic valve 2015, nonobstructive CAD, HTN, HLD.  PET MPI 12/27/2023 was negative for ischemia.  Echo 09/13/2023 showed LVEF 60 to 65%, grade 1 DD, normal RV systolic function, normally functioning aortic valve prosthesis.  Seen by Dr. Vernice 02/12/2024, stable from cardiac standpoint, current medications continued, no changes to management.  Follows with neurology for management of Parkinson's disease.  Other pertinent history includes PONV, GERD on PPI.  Preop labs reviewed, mild anemia hemoglobin 12.9, mild thrombocytopenia with platelets 137, otherwise unremarkable.  EKG 09/14/2023: Sinus rhythm with sinus arrhythmia with 1st degree A-V block.  Rate 63. Minimal voltage criteria for LVH, may be normal variant.. Septal infarct , age undetermined  Cardiac PET/CT 12/27/2023:   The study is normal. The study is low risk.   LV perfusion is normal. There is no evidence of ischemia. There is no evidence of infarction.   Rest left ventricular function is normal. Rest EF: 69%. Stress left ventricular function is normal. Stress EF: 69%. End diastolic cavity size is normal. End systolic cavity size is normal.   Myocardial blood flow was computed to be 0.71ml/g/min at rest and 1.73ml/g/min at stress. Global myocardial blood flow reserve was 2.20 and was normal.   Coronary calcium  was present on the attenuation correction CT images. Severe coronary calcifications were present. Coronary calcifications were present in the left anterior descending artery, left circumflex artery and right coronary artery distribution(s).   Electronically signed by Lonni Nanas, MD  TTE 09/13/2023:  1. Left ventricular ejection fraction, by estimation, is 60 to 65%. The  left ventricle has normal function. The left ventricle has no regional  wall motion abnormalities. Left ventricular diastolic parameters are  consistent  with Grade I diastolic  dysfunction (impaired relaxation).   2. Right ventricular systolic function is normal. The right ventricular  size is mildly enlarged.   3. The mitral valve is degenerative. Trivial mitral valve regurgitation.  No evidence of mitral stenosis. Moderate mitral annular calcification.   4. The aortic valve has been repaired/replaced. Aortic valve  regurgitation is not visualized. No aortic stenosis is present. There is a  25 mm [employer] valve present in the aortic position. Echo  findings are consistent with normal structure and   function of the aortic valve prosthesis. Aortic valve mean gradient  measures 7.8 mmHg. Aortic valve Vmax measures 1.88 m/s.   5. Aortic dilatation noted. There is mild dilatation of the ascending  aorta, measuring 40 mm.   6. The inferior vena cava is normal in size with greater than 50%  respiratory variability, suggesting right atrial pressure of 3 mmHg.   Comparison(s): No significant change from prior study. Prior images  reviewed side by side.      Lynwood Geofm RIGGERS [facility]/Anesthesiology Phone [phone] 02/14/2024 2:51 PM

## 2024-02-14 NOTE — Anesthesia Preprocedure Evaluation (Addendum)
 Anesthesia Evaluation  Patient identified by MRN, date of birth, ID band Patient awake    Reviewed: Allergy & Precautions, NPO status , Patient's Chart, lab work & pertinent test results  History of Anesthesia Complications (+) PONV and history of anesthetic complications  Airway Mallampati: II  TM Distance: >3 FB Neck ROM: Full    Dental  (+) Dental Advisory Given   Pulmonary former smoker   breath sounds clear to auscultation       Cardiovascular hypertension, Pt. on medications and Pt. on home beta blockers + CAD  + Valvular Problems/Murmurs (s/p AVR. Normal LVEF. Normal gradient post replacement)  Rhythm:Regular Rate:Normal     Neuro/Psych  Neuromuscular disease (Parkinsons dz)    GI/Hepatic Neg liver ROS,GERD  ,,  Endo/Other  negative endocrine ROS    Renal/GU negative Renal ROS     Musculoskeletal  (+) Arthritis ,    Abdominal   Peds  Hematology negative hematology ROS (+)   Anesthesia Other Findings   Reproductive/Obstetrics                              Anesthesia Physical Anesthesia Plan  ASA: 3  Anesthesia Plan: General   Post-op Pain Management:    Induction:   PONV Risk Score and Plan: 3 and Ondansetron , Dexamethasone and Propofol  infusion  Airway Management Planned: Oral ETT  Additional Equipment:   Intra-op Plan:   Post-operative Plan: Extubation in OR  Informed Consent: I have reviewed the patients History and Physical, chart, labs and discussed the procedure including the risks, benefits and alternatives for the proposed anesthesia with the patient or authorized representative who has indicated his/her understanding and acceptance.     Dental advisory given  Plan Discussed with: CRNA  Anesthesia Plan Comments: (PAT note by Lynwood Hope, PA-C: 78 year old male follows with cardiology for history of AVR with bioprosthetic valve 2015, nonobstructive CAD,  HTN, HLD.  PET MPI 12/27/2023 was negative for ischemia.  Echo 09/13/2023 showed LVEF 60 to 65%, grade 1 DD, normal RV systolic function, normally functioning aortic valve prosthesis.  Seen by Dr. Vernice 02/12/2024, stable from cardiac standpoint, current medications continued, no changes to management.  Follows with neurology for management of Parkinson's disease.  Other pertinent history includes PONV, GERD on PPI.  Preop labs reviewed, mild anemia hemoglobin 12.9, mild thrombocytopenia with platelets 137, otherwise unremarkable.  EKG 09/14/2023: Sinus rhythm with sinus arrhythmia with 1st degree A-V block.  Rate 63. Minimal voltage criteria for LVH, may be normal variant.. Septal infarct , age undetermined  Cardiac PET/CT 12/27/2023:   The study is normal. The study is low risk.   LV perfusion is normal. There is no evidence of ischemia. There is no evidence of infarction.   Rest left ventricular function is normal. Rest EF: 69%. Stress left ventricular function is normal. Stress EF: 69%. End diastolic cavity size is normal. End systolic cavity size is normal.   Myocardial blood flow was computed to be 0.67ml/g/min at rest and 1.36ml/g/min at stress. Global myocardial blood flow reserve was 2.20 and was normal.   Coronary calcium  was present on the attenuation correction CT images. Severe coronary calcifications were present. Coronary calcifications were present in the left anterior descending artery, left circumflex artery and right coronary artery distribution(s).   Electronically signed by Lonni Nanas, MD  TTE 09/13/2023: 1. Left ventricular ejection fraction, by estimation, is 60 to 65%. The  left ventricle has normal function. The  left ventricle has no regional  wall motion abnormalities. Left ventricular diastolic parameters are  consistent with Grade I diastolic  dysfunction (impaired relaxation).  2. Right ventricular systolic function is normal. The right ventricular   size is mildly enlarged.  3. The mitral valve is degenerative. Trivial mitral valve regurgitation.  No evidence of mitral stenosis. Moderate mitral annular calcification.  4. The aortic valve has been repaired/replaced. Aortic valve  regurgitation is not visualized. No aortic stenosis is present. There is a  25 mm [employer] valve present in the aortic position. Echo  findings are consistent with normal structure and  function of the aortic valve prosthesis. Aortic valve mean gradient  measures 7.8 mmHg. Aortic valve Vmax measures 1.88 m/s.  5. Aortic dilatation noted. There is mild dilatation of the ascending  aorta, measuring 40 mm.  6. The inferior vena cava is normal in size with greater than 50%  respiratory variability, suggesting right atrial pressure of 3 mmHg.   Comparison(s): No significant change from prior study. Prior images  reviewed side by side.   )         Anesthesia Quick Evaluation

## 2024-02-14 NOTE — Therapy (Signed)
 OUTPATIENT SPEECH LANGUAGE PATHOLOGY [facility]   Patient Name: Drew Andrews MRN: [id number] DOB:Dec 17, 1945, 78 y.o., male Today's Date: 02/14/2024  PCP: Vernadine Ade, MD REFERRING PROVIDER: Onita Duos, MD  END OF SESSION:  End of Session - 02/14/24 1238     Visit Number 8    Number of Visits 17    Date for Recertification  03/06/24    SLP Start Time 1233    SLP Stop Time  1315    SLP Time Calculation (min) 42 min    Activity Tolerance Patient tolerated treatment well             Past Medical History:  Diagnosis Date   Arthritis    Ascending aorta dilation 09/14/2023   TTE 09/13/2023: EF 60-65, no RWMA, GR 1 DD, normal RVSF, trivial MR, moderate MAC, AVR with normal structure and function, mild dilation of ascending aorta (40 mm)    Bursitis of elbow 05/20/2013   Coronary artery disease    Nonobstructive   Depression    situational   Essential hypertension 03/12/2013   GERD (gastroesophageal reflux disease)    Heart murmur    s/p  aortic valve replacement surgery by Dr. Dorise Fellers 05/13/2013   Hyperlipidemia    Hypertension    Left ventricular diastolic dysfunction, NYHA class 2 03/12/2013   LVH (left ventricular hypertrophy)    with aortic stenosis-bicuspid   Neuromuscular disorder (HCC)    Parkinson's disease (HCC)    Pneumonia 2024   PONV (postoperative nausea and vomiting)    as a child   S/P AVR (aortic valve replacement) 05/13/2013   S/P AVR, 05/13/13, 25 mm Edwards Magna-Ease pericardial valve 05/13/2013   Seasonal allergies    Severe aortic stenosis 03/12/2013   Past Surgical History:  Procedure Laterality Date   AORTIC VALVE REPLACEMENT N/A 05/13/2013   Procedure: AORTIC VALVE REPLACEMENT (AVR);  Surgeon: Dorise MARLA Fellers, MD;  Location: [facility] OR;  Service: Open Heart Surgery;  Laterality: N/A;   APPENDECTOMY  age 33   CARDIAC CATHETERIZATION     COLONOSCOPY     X 2   HIATAL HERNIA REPAIR     INTRAOPERATIVE  TRANSESOPHAGEAL ECHOCARDIOGRAM N/A 05/13/2013   Procedure: INTRAOPERATIVE TRANSESOPHAGEAL ECHOCARDIOGRAM;  Surgeon: Dorise MARLA Fellers, MD;  Location: [facility] OR;  Service: Open Heart Surgery;  Laterality: N/A;   LEFT HEART CATHETERIZATION WITH CORONARY ANGIOGRAM N/A 03/15/2013   Procedure: LEFT HEART CATHETERIZATION WITH CORONARY ANGIOGRAM;  Surgeon: Debby DELENA Sor, MD;  Location: [facility] CATH LAB;  Service: Cardiovascular;  Laterality: N/A;   TONSILLECTOMY     TRANSTHORACIC ECHOCARDIOGRAM  03/04/2013   EF 55-60%, grade 2 diastolic dysfunction, AV with mod calcified leaflets & mild regurg, calcified MV annulus, LA mod dilated, RV mildly dilated   Patient Active Problem List   Diagnosis Date Noted   Ascending aorta dilation 09/14/2023   CAD (coronary artery disease) 08/09/2023   Low back pain with sciatica 07/07/2023   Anxiety 04/17/2023   Cognitive impairment 01/12/2023   Parkinson's disease without dyskinesia or fluctuating manifestations (HCC) 01/12/2023   GERD (gastroesophageal reflux disease) 11/15/2015   Diplopia 02/21/2014   S/P AVR (aortic valve replacement) 07/13/2013   Rash of back, contact dermatitis resolved 05/31/2013   Bursitis of elbow 05/20/2013   S/P AVR, 05/13/13, 25 mm Edwards Magna-Ease pericardial valve 05/13/2013   Severe aortic stenosis 03/12/2013   Hyperlipidemia 03/12/2013   Essential hypertension 03/12/2013   Left ventricular diastolic dysfunction, NYHA class 2 03/12/2013    ONSET DATE:  script 11/30/23  REFERRING DIAG: R47.01 (ICD-10-CM) - Aphasia R13.12 (ICD-10-CM) - Dysphagia, oropharyngeal phase F80.1 (ICD-10-CM) - Expressive language disorder R26.81 (ICD-10-CM) - Unsteadiness on feet  THERAPY DIAG:  Dysarthria and anarthria  Dysphagia, oropharyngeal phase  Cognitive communication deficit  Rationale for Evaluation and Treatment: Rehabilitation  SUBJECTIVE:   SUBJECTIVE STATEMENT:  I did it yesterday, double checked it, and now I can't find the thing.   Pt  accompanied by: self   PERTINENT HISTORY: See above  PAIN:  Are you having pain? Yes: NPRS scale: 2/10 Pain location: abdomen Pain description: ache Aggravating factors: sitting still Relieving factors: standing up  FALLS: Has patient fallen in last 6 months?  See PT evaluation for details  PATIENT GOALS: get myself better  OBJECTIVE:  Note: Objective measures were completed at Evaluation unless otherwise noted.  DIAGNOSTIC FINDINGS:  MBS 12/18/23: Clinical Impression: Drew Andrews presents with an oropharyngeal swallow that is largely [facility] and without aspiration of any of the liquid or solid barium consistencies tested. He had one instance of flash penetration (PAS 2) above the vocal cords when swallowing thin liquid barium with 13mm barium tablet. Only mild amount of pyriform residuals and trace amount of lingual and vallecular residuals remained s/p initial swallows. Retrograde flow of barium below and through PES was observed with liquid barium residuals in pyriform sinuses. 13mm barium tablet became briefly lodged at thoracic level of esophagus but fully transited with sips of thin liquid barium. No stasis or retrograde movement of barium observed in distal portion of esophagus. SLP spoke with Mr. Massingale and his spouse in lobby after MBS completed and neither had any c/o dysphagia.   Factors that may increase risk of adverse event in presence of aspiration [name] 2021): Factors that may increase risk of adverse event in presence of aspiration [name] 2021): Reduced cognitive function   Recommendations/Plan: Swallowing Evaluation Recommendations Swallowing Evaluation Recommendations Recommendations: PO diet PO Diet Recommendation: Regular; Thin liquids (Level 0) Liquid Administration via: Cup; Straw Medication Administration: Whole meds with liquid Supervision: Patient able to self-feed Swallowing strategies  : Slow rate; Small bites/sips Postural  changes: Position pt fully upright for meals Oral care recommendations: Oral care BID (2x/day)   MRI brain 02/16/23 IMPRESSION: This MRI of the brain without contrast shows the following: Mild to moderate generalized cortical atrophy without a lobar predominance.  The extent is more than typical for age. Few scattered T2/FLAIR hyperintense foci in the cerebral hemispheres consistent with minimal chronic microvascular ischemic change that would be typical for age No acute findings.    Cognitive Linguistic Quick Test (CLQT)  AGE - 70-89   The Cognitive Linguistic Quick Test was administered to assess the relative status of five cognitive domains: attention, memory, language, executive functioning, and visuospatial skills. Scores from 10 tasks were used to estimate severity ratings (standardized for age groups 18-69 years and 70-89 years) for each domain, a clock drawing task, as well as an overall composite severity rating of cognition.      Task Score Criterion Cut Scores  Personal Facts 8/8 8  Symbol Cancellation 10/12 10  Confrontation Naming 10/10 10  Clock Drawing  9/13 11  Story Retelling 6/10 5  Symbol Trails 10/10 6  Generative Naming 6/9 4  Design Memory 3/6 4  Mazes  4/8 4  Design Generation 6/13 5    Cognitive Domain Composite Score Severity Rating  Attention 160/215 Low-WNL  Memory 128/185 Mild  Executive Function 26/40 WNL  Language 30/37  WNL  Visuospatial Skills 70/105 WNL  Clock Drawing  9/13 Mild  Composite Severity Rating  WNL   Scores indicate that Garrel has difficulty with memory and attention. Clock drawing subtest targets organization, planning, attention, and visuospatial skills. SLP suspects attention and visuospatial skills were difficult for pt on this subtest due to poorly positioned numbers in relation to the circle, origin of the hands, and inaccurate pointing of hands.    Completed audio recording of patients baseline voice without cueing from SLP:  No  PATIENT REPORTED OUTCOME MEASURES (PROM): Cognitive Function: Pt scored himself (with help from wife) 75/140 with lower scores indicating more of an effect of deficits on daily tasks. He scored 5 (no difficulty) with using a clock. 1 (cannot do) was scored with remembering where things were put away or placed, and remembering a list of 4-5 errands or items without writing them down. 2 (often or a lot - 2-3 times/day was scored with difficulty: getting things organized, doing more than one thing at a time, and trouble: remembering things I was supposed to do, remembering the name of a familiar person, thinking clearly, and had difficulty: with slow thinking, working hard to pay attention or I would make a mistake, making decisions, and planning out steps for a task.                                                                                                                             TREATMENT DATE:  AB - Abdominal breathing  02/14/24: SWALLOW: Pt brings in a handout for his heart sx Monday about cough. PT completed Mendelsohn with 96% success using tactile cues, and effortful with independence.  SPEECH: Pt compelted detailed written tasks with 100% success with one modification. SLP provided homework for detailed written tasks.   02/12/24: 9 minutes late today. SPEECH: After much searching in his folder for homework mentioned in s, pt and SLP agreed that pt completed computations for his homework on other papers and did not transfer answers to the handout. Pt thought SLP provided the handout two-three sessions ago because of this. SLP used max cues for correlating handout with pt's written work. Pt's temporal orientation was not good today- (day of the week); Max A necessary from SLP.  SWALLOW: SLP checked pt's swallow HEP. Pt was not performing Mendelsohn correctly (no squeeze). SLP provided cues so that pt was performing correctly at end of session. SLP WILL NEED TO VERIFY  CORRECT PROCEDURE for Mendelsohn next session.  02/07/24: SPEECH: Pt req'd total A for corrections with homework. SLP observed pt using AB at rest 90%, with sentence responses pt produced AB 90%. Homework provided for detailed instructions/processing.   SWALLOW: SLP observed pt as he completed 15 effortful swallows, pt req'd cues for tracking his reps on the tracking sheet. With mod cues pt achieved this. Procedure for effortful was WNL. With [place] pt procedure was WNL with min A not to breath in the middle of  the hold. Tracked independently.  02/05/24: Pt will work on AB in speaking next session.  SWALLOWING: SLP guided pt through his dysphagia exercises with rare min A for procedure, and mod cues for tracking exercises correctly using his tracking sheet. SLP encouraged pt to have 120 each over 4 days. SPEECH: Pt entered with homework completed 85%, with what was completed 100% success. SLP assisted pt with corrections and pt needed extra time. He demonstrated AB at rest 70-75%.  01/31/24: Pt returned his PROM with responses coinciding with reduced memory, attention, and processing speed. Score as above. Today SLP worked with pt on processing speed helping him sequence 6-steps in a task. Pt req'd occasional mod cues with the first set of 4 taking almost 10 minutes to complete. Pt agreed after this that it should not have taken that long to complete. The second set of 4 took pt 5 minutes 25 seconds to complete. Pt read over two sequences of 6 steps prior to working these out, due to SLP pointing out pt was continuing to have errors due to beginning to number steps without  having read all of the steps in the sequence. Homework for sequencing. SLP cued pt to read steps prior to performing.   01/29/24: CLQT completed. PROM provided (cognitive - long). SWALLOW: pt performed WNL with effortful but req'd mod A with Mendelsohn due to not holding squeeze. When SLP cued pt he demonstrated slow thyroid  return  instead of a hold. Pt was independent with exercise at end of task.  SPEECH: SLP worked with pt and his AB today in speech. In sentence responses pt demonstrated 95% AB once cued for tactile cue. In conversation of 5 minutes pt was successful with AB 80%.   01/24/24: Needs CLQT next session. SPEECH: SLP taught pt about AB and the rationale for this. Pt answered yes/no questions and wh questions with 90% success with AB. SWALLOWING: SLP assisted pt with Mendelsohn and effortful. He req'd min A initially for [employer].  01/17/24: SWALLOWING: SLP worked with pt today with Claud[place] and added this to his HEP for swallowing. Pt was independent after 4 demonstrations. SLP noticed pt was tracking completion for the last two days and provided him an exercise tracking sheet.  SPEECH: Completed CLQT today- results above.  01/15/24: SPEECH: Answered Sharon's question about goals by giving summary of SLP's goals for pt. SLP explained rationale to administer CLQT today. SLP began CLQT - to be completed next session.  SWALLOWING: SLP demonstrated effortful swallow for pt (see pt instructions). Pt performed with initial min cues faded to mod I (handout). Pt to complete effortful swallow 15 reps BID. SLP to provide [facility] next session, in order to maintain swallow strength. SLP to also work on swallowing with intent along with other compensations from [facility].   12/07/23: n/a  PATIENT EDUCATION: Education details: See treatment date Person educated: Patient Education method: Explanation, Demonstration, Verbal cues, and Handouts Education comprehension: verbalized understanding, returned demonstration, verbal cues required, and needs further education  HOME EXERCISE PROGRAM: AB, and for swallowing    GOALS: Goals reviewed with patient? No  SHORT TERM GOALS: Target date: 02/09/24  Pt will achieve AB at rest 80% of the time in 3 sessions Baseline:01/24/24  Goal status: partially met  2.  Pt will  achieve AB 80% in sentence responses with min nonverbal cues in 2 sessions Baseline: 01/29/24 Goal status: met  3.  Pt will complete cognitive eval in first 2 sessions Baseline:  Goal status: met  4.  Pt will complete dysphagia HEP and strategies from MBS on 12/18/23 with rare min A in 3 sessions Baseline: (HEP 02/05/24) Goal status: INITIAL  5.  Pt will tell SLP two memory strategies he could use in 2 sessions Baseline: 01/29/24 Goal status: Met   LONG TERM GOALS: Target date: 03/06/24  Pt will improve PROM compared to initial administration Baseline:  Goal status: INITIAL  2.  Pt will complete dysphagia HEP and strategies rom MBS on 12/18/23 with mod I in 3 sessions Baseline:  Goal status: INITIAL  3.  Pt will achieve AB 80% in 5 minutes simple conversation with min nonverbal cues in 2 sessions Baseline:  Goal status: INITIAL  4.  Pt will use one memory strategy successfully in/between two sessions Baseline:  Goal status: INITIAL  5.  Pt will demo WFL alternating attention between two simple tasks in 3 sessions Baseline:  Goal status: INITIAL  ASSESSMENT:  CLINICAL IMPRESSION: Patient is a 78 y.o. M who was seen today for treatment of cognition, and/or swallowing, and/or dysarthria (decr'd breath support for speech) c/b rough voice intermittently. Today pt demonstrated s/sx of decr'd cognitive function. See treatment date above for today's date for further details on today's session. Pt is learning to use AB in speech.   OBJECTIVE IMPAIRMENTS: Objective impairments include attention, memory, awareness, voice disorder, and dysphagia. These impairments are limiting patient from household responsibilities, ADLs/IADLs, effectively communicating at home and in community, and safety when swallowing.Factors affecting potential to achieve goals and functional outcome are ability to learn/carryover information.. Patient will benefit from skilled SLP services to address above  impairments and improve overall function.  REHAB POTENTIAL: Good  PLAN:  SLP FREQUENCY: 2x/week  SLP DURATION: 8 weeks   PLANNED INTERVENTIONS: Aspiration precaution training, Pharyngeal strengthening exercises, Diet toleration management , Environmental controls, Trials of upgraded texture/liquids, Cueing hierachy, Cognitive reorganization, Internal/external aids, Oral motor exercises, Functional tasks, SLP instruction and feedback, Compensatory strategies, Patient/family education, [phone] Treatment of speech (30 or 45 min) , and [id number] Treatment of swallowing function    Vallery Mcdade, CCC-SLP 02/14/2024, 1:20 PM   .oprcslps

## 2024-02-15 DIAGNOSIS — Z125 Encounter for screening for malignant neoplasm of prostate: Secondary | ICD-10-CM | POA: Diagnosis not present

## 2024-02-15 DIAGNOSIS — E7849 Other hyperlipidemia: Secondary | ICD-10-CM | POA: Diagnosis not present

## 2024-02-15 DIAGNOSIS — Z1212 Encounter for screening for malignant neoplasm of rectum: Secondary | ICD-10-CM | POA: Diagnosis not present

## 2024-02-16 DIAGNOSIS — G20A1 Parkinson's disease without dyskinesia, without mention of fluctuations: Secondary | ICD-10-CM | POA: Diagnosis not present

## 2024-02-16 DIAGNOSIS — R82998 Other abnormal findings in urine: Secondary | ICD-10-CM | POA: Diagnosis not present

## 2024-02-16 DIAGNOSIS — I517 Cardiomegaly: Secondary | ICD-10-CM | POA: Diagnosis not present

## 2024-02-16 DIAGNOSIS — Z952 Presence of prosthetic heart valve: Secondary | ICD-10-CM | POA: Diagnosis not present

## 2024-02-16 DIAGNOSIS — E663 Overweight: Secondary | ICD-10-CM | POA: Diagnosis not present

## 2024-02-16 DIAGNOSIS — Z1339 Encounter for screening examination for other mental health and behavioral disorders: Secondary | ICD-10-CM | POA: Diagnosis not present

## 2024-02-16 DIAGNOSIS — N1831 Chronic kidney disease, stage 3a: Secondary | ICD-10-CM | POA: Diagnosis not present

## 2024-02-16 DIAGNOSIS — E7800 Pure hypercholesterolemia, unspecified: Secondary | ICD-10-CM | POA: Diagnosis not present

## 2024-02-16 DIAGNOSIS — Z1331 Encounter for screening for depression: Secondary | ICD-10-CM | POA: Diagnosis not present

## 2024-02-16 DIAGNOSIS — I130 Hypertensive heart and chronic kidney disease with heart failure and stage 1 through stage 4 chronic kidney disease, or unspecified chronic kidney disease: Secondary | ICD-10-CM | POA: Diagnosis not present

## 2024-02-16 DIAGNOSIS — R0609 Other forms of dyspnea: Secondary | ICD-10-CM | POA: Diagnosis not present

## 2024-02-16 DIAGNOSIS — M479 Spondylosis, unspecified: Secondary | ICD-10-CM | POA: Diagnosis not present

## 2024-02-16 DIAGNOSIS — I5032 Chronic diastolic (congestive) heart failure: Secondary | ICD-10-CM | POA: Diagnosis not present

## 2024-02-16 DIAGNOSIS — Z0000 Encounter for general adult medical examination without abnormal findings: Secondary | ICD-10-CM | POA: Diagnosis not present

## 2024-02-16 DIAGNOSIS — N401 Enlarged prostate with lower urinary tract symptoms: Secondary | ICD-10-CM | POA: Diagnosis not present

## 2024-02-19 ENCOUNTER — Encounter (HOSPITAL_COMMUNITY): Admission: RE | Disposition: A | Payer: Self-pay | Source: Home / Self Care | Attending: Neurological Surgery

## 2024-02-19 ENCOUNTER — Ambulatory Visit (HOSPITAL_COMMUNITY): Payer: Self-pay | Admitting: Physician Assistant

## 2024-02-19 ENCOUNTER — Other Ambulatory Visit: Payer: Self-pay

## 2024-02-19 ENCOUNTER — Encounter (HOSPITAL_COMMUNITY): Payer: Self-pay | Admitting: Neurological Surgery

## 2024-02-19 ENCOUNTER — Ambulatory Visit (HOSPITAL_COMMUNITY)

## 2024-02-19 ENCOUNTER — Observation Stay (HOSPITAL_COMMUNITY)
Admission: RE | Admit: 2024-02-19 | Discharge: 2024-02-20 | Disposition: A | Attending: Neurological Surgery | Admitting: Neurological Surgery

## 2024-02-19 ENCOUNTER — Encounter

## 2024-02-19 DIAGNOSIS — M48061 Spinal stenosis, lumbar region without neurogenic claudication: Secondary | ICD-10-CM | POA: Diagnosis not present

## 2024-02-19 DIAGNOSIS — Z79899 Other long term (current) drug therapy: Secondary | ICD-10-CM | POA: Insufficient documentation

## 2024-02-19 DIAGNOSIS — M7138 Other bursal cyst, other site: Secondary | ICD-10-CM | POA: Diagnosis not present

## 2024-02-19 DIAGNOSIS — M5116 Intervertebral disc disorders with radiculopathy, lumbar region: Secondary | ICD-10-CM | POA: Diagnosis not present

## 2024-02-19 DIAGNOSIS — I2510 Atherosclerotic heart disease of native coronary artery without angina pectoris: Secondary | ICD-10-CM | POA: Insufficient documentation

## 2024-02-19 DIAGNOSIS — M5416 Radiculopathy, lumbar region: Secondary | ICD-10-CM | POA: Diagnosis not present

## 2024-02-19 DIAGNOSIS — I10 Essential (primary) hypertension: Secondary | ICD-10-CM

## 2024-02-19 DIAGNOSIS — Z7982 Long term (current) use of aspirin: Secondary | ICD-10-CM | POA: Insufficient documentation

## 2024-02-19 DIAGNOSIS — Z87891 Personal history of nicotine dependence: Secondary | ICD-10-CM | POA: Diagnosis not present

## 2024-02-19 DIAGNOSIS — M4316 Spondylolisthesis, lumbar region: Secondary | ICD-10-CM | POA: Diagnosis not present

## 2024-02-19 DIAGNOSIS — Z981 Arthrodesis status: Principal | ICD-10-CM

## 2024-02-19 HISTORY — DX: Cardiac murmur, unspecified: R01.1

## 2024-02-19 HISTORY — DX: Atherosclerotic heart disease of native coronary artery without angina pectoris: I25.10

## 2024-02-19 SURGERY — POSTERIOR LUMBAR FUSION 1 LEVEL
Anesthesia: General | Site: Back

## 2024-02-19 MED ORDER — PHENYLEPHRINE HCL-NACL 20-0.9 MG/250ML-% IV SOLN
INTRAVENOUS | Status: DC | PRN
  Administered 2024-02-19: 20 ug/min via INTRAVENOUS

## 2024-02-19 MED ORDER — DIPHENHYDRAMINE HCL 25 MG PO CAPS
25.0000 mg | ORAL_CAPSULE | Freq: Every day | ORAL | Status: DC
  Administered 2024-02-19: 25 mg via ORAL
  Filled 2024-02-19: qty 1

## 2024-02-19 MED ORDER — THROMBIN 5000 UNITS EX KIT
PACK | CUTANEOUS | Status: AC
  Filled 2024-02-19: qty 1

## 2024-02-19 MED ORDER — EPHEDRINE 5 MG/ML INJ
INTRAVENOUS | Status: AC
  Filled 2024-02-19: qty 5

## 2024-02-19 MED ORDER — THROMBIN 20000 UNITS EX SOLR
CUTANEOUS | Status: DC | PRN
  Administered 2024-02-19: 20 mL via TOPICAL

## 2024-02-19 MED ORDER — ROCURONIUM BROMIDE 10 MG/ML (PF) SYRINGE
PREFILLED_SYRINGE | INTRAVENOUS | Status: DC | PRN
  Administered 2024-02-19: 50 mg via INTRAVENOUS
  Administered 2024-02-19: 20 mg via INTRAVENOUS
  Administered 2024-02-19: 10 mg via INTRAVENOUS

## 2024-02-19 MED ORDER — ACETAMINOPHEN 325 MG PO TABS: 650.0000 mg | ORAL_TABLET | ORAL | Status: DC | PRN

## 2024-02-19 MED ORDER — LIDOCAINE 2% (20 MG/ML) 5 ML SYRINGE
INTRAMUSCULAR | Status: DC | PRN
  Administered 2024-02-19: 60 mg via INTRAVENOUS

## 2024-02-19 MED ORDER — ONDANSETRON HCL 4 MG/2ML IJ SOLN: 4.0000 mg | Freq: Four times a day (QID) | INTRAMUSCULAR | Status: DC | PRN

## 2024-02-19 MED ORDER — ONDANSETRON HCL 4 MG/2ML IJ SOLN: 4.0000 mg | Freq: Once | INTRAMUSCULAR | Status: DC | PRN

## 2024-02-19 MED ORDER — OXYCODONE-ACETAMINOPHEN 5-325 MG PO TABS
1.0000 | ORAL_TABLET | ORAL | Status: DC | PRN
  Administered 2024-02-19 – 2024-02-20 (×4): 1 via ORAL
  Filled 2024-02-19 (×4): qty 1

## 2024-02-19 MED ORDER — DIPHENHYDRAMINE HCL 25 MG PO TABS
25.0000 mg | ORAL_TABLET | Freq: Every day | ORAL | Status: DC
  Filled 2024-02-19: qty 1

## 2024-02-19 MED ORDER — CARBIDOPA-LEVODOPA 25-100 MG PO TABS
1.0000 | ORAL_TABLET | Freq: Four times a day (QID) | ORAL | Status: DC
  Administered 2024-02-19 – 2024-02-20 (×4): 1 via ORAL
  Filled 2024-02-19 (×4): qty 1

## 2024-02-19 MED ORDER — BUPIVACAINE-EPINEPHRINE (PF) 0.25% -1:200000 IJ SOLN
INTRAMUSCULAR | Status: DC | PRN
  Administered 2024-02-19: 10 mL

## 2024-02-19 MED ORDER — SENNA 8.6 MG PO TABS
1.0000 | ORAL_TABLET | Freq: Two times a day (BID) | ORAL | Status: DC
  Administered 2024-02-19 – 2024-02-20 (×3): 8.6 mg via ORAL
  Filled 2024-02-19 (×3): qty 1

## 2024-02-19 MED ORDER — CEFAZOLIN SODIUM-DEXTROSE 2-4 GM/100ML-% IV SOLN
2.0000 g | INTRAVENOUS | Status: AC
  Administered 2024-02-19: 2 g via INTRAVENOUS
  Filled 2024-02-19: qty 100

## 2024-02-19 MED ORDER — FENTANYL CITRATE (PF) 100 MCG/2ML IJ SOLN
INTRAMUSCULAR | Status: AC
  Filled 2024-02-19: qty 2

## 2024-02-19 MED ORDER — FENTANYL CITRATE (PF) 250 MCG/5ML IJ SOLN
INTRAMUSCULAR | Status: AC
  Filled 2024-02-19: qty 5

## 2024-02-19 MED ORDER — LIDOCAINE 2% (20 MG/ML) 5 ML SYRINGE
INTRAMUSCULAR | Status: AC
  Filled 2024-02-19: qty 5

## 2024-02-19 MED ORDER — PROPOFOL 10 MG/ML IV BOLUS
INTRAVENOUS | Status: DC | PRN
Start: 2024-02-19 — End: 2024-02-19
  Administered 2024-02-19: 50 ug/kg/min via INTRAVENOUS
  Administered 2024-02-19: 130 mg via INTRAVENOUS

## 2024-02-19 MED ORDER — EZETIMIBE 10 MG PO TABS
10.0000 mg | ORAL_TABLET | Freq: Every day | ORAL | Status: DC
  Administered 2024-02-19 – 2024-02-20 (×2): 10 mg via ORAL
  Filled 2024-02-19 (×2): qty 1

## 2024-02-19 MED ORDER — CHLORHEXIDINE GLUCONATE 0.12 % MT SOLN
15.0000 mL | Freq: Once | OROMUCOSAL | Status: AC
  Administered 2024-02-19: 15 mL via OROMUCOSAL
  Filled 2024-02-19: qty 15

## 2024-02-19 MED ORDER — METHOCARBAMOL 1000 MG/10ML IJ SOLN: 500.0000 mg | Freq: Four times a day (QID) | INTRAMUSCULAR | Status: DC | PRN

## 2024-02-19 MED ORDER — LISINOPRIL 20 MG PO TABS
40.0000 mg | ORAL_TABLET | Freq: Every day | ORAL | Status: DC
  Administered 2024-02-19 – 2024-02-20 (×2): 40 mg via ORAL
  Filled 2024-02-19 (×2): qty 2

## 2024-02-19 MED ORDER — SODIUM CHLORIDE 0.9 % IV SOLN: 250.0000 mL | INTRAVENOUS | Status: DC

## 2024-02-19 MED ORDER — ROCURONIUM BROMIDE 10 MG/ML (PF) SYRINGE
PREFILLED_SYRINGE | INTRAVENOUS | Status: AC
  Filled 2024-02-19: qty 10

## 2024-02-19 MED ORDER — MENTHOL 3 MG MT LOZG: 1.0000 | LOZENGE | OROMUCOSAL | Status: DC | PRN

## 2024-02-19 MED ORDER — ONDANSETRON HCL 4 MG PO TABS: 4.0000 mg | ORAL_TABLET | Freq: Four times a day (QID) | ORAL | Status: DC | PRN

## 2024-02-19 MED ORDER — EPHEDRINE SULFATE-NACL 50-0.9 MG/10ML-% IV SOSY
PREFILLED_SYRINGE | INTRAVENOUS | Status: DC | PRN
  Administered 2024-02-19: 5 mg via INTRAVENOUS

## 2024-02-19 MED ORDER — CARVEDILOL 6.25 MG PO TABS
6.2500 mg | ORAL_TABLET | Freq: Two times a day (BID) | ORAL | Status: DC
  Administered 2024-02-19 – 2024-02-20 (×2): 6.25 mg via ORAL
  Filled 2024-02-19 (×2): qty 1

## 2024-02-19 MED ORDER — ONDANSETRON HCL 4 MG/2ML IJ SOLN
INTRAMUSCULAR | Status: DC | PRN
  Administered 2024-02-19: 4 mg via INTRAVENOUS

## 2024-02-19 MED ORDER — POTASSIUM CHLORIDE IN NACL 20-0.9 MEQ/L-% IV SOLN
INTRAVENOUS | Status: DC
  Filled 2024-02-19 (×2): qty 1000

## 2024-02-19 MED ORDER — CELECOXIB 200 MG PO CAPS
200.0000 mg | ORAL_CAPSULE | Freq: Two times a day (BID) | ORAL | Status: DC
  Administered 2024-02-19 – 2024-02-20 (×2): 200 mg via ORAL
  Filled 2024-02-19 (×4): qty 1

## 2024-02-19 MED ORDER — FENTANYL CITRATE (PF) 250 MCG/5ML IJ SOLN
INTRAMUSCULAR | Status: DC | PRN
  Administered 2024-02-19: 50 ug via INTRAVENOUS
  Administered 2024-02-19: 100 ug via INTRAVENOUS
  Administered 2024-02-19: 50 ug via INTRAVENOUS

## 2024-02-19 MED ORDER — BUPIVACAINE HCL (PF) 0.25 % IJ SOLN
INTRAMUSCULAR | Status: AC
  Filled 2024-02-19: qty 30

## 2024-02-19 MED ORDER — SODIUM CHLORIDE 0.9% FLUSH: 3.0000 mL | INTRAVENOUS | Status: DC | PRN

## 2024-02-19 MED ORDER — THROMBIN 20000 UNITS EX SOLR
CUTANEOUS | Status: AC
Start: 2024-02-19 — End: 2024-02-19
  Filled 2024-02-19: qty 20000

## 2024-02-19 MED ORDER — ORAL CARE MOUTH RINSE: 15.0000 mL | Freq: Once | OROMUCOSAL | Status: AC

## 2024-02-19 MED ORDER — PHENYLEPHRINE 80 MCG/ML (10ML) SYRINGE FOR IV PUSH (FOR BLOOD PRESSURE SUPPORT)
PREFILLED_SYRINGE | INTRAVENOUS | Status: DC | PRN
  Administered 2024-02-19: 80 ug via INTRAVENOUS

## 2024-02-19 MED ORDER — GABAPENTIN 300 MG PO CAPS
600.0000 mg | ORAL_CAPSULE | Freq: Every day | ORAL | Status: DC
  Administered 2024-02-19: 600 mg via ORAL
  Filled 2024-02-19: qty 2

## 2024-02-19 MED ORDER — ESOMEPRAZOLE MAGNESIUM 20 MG PO PACK: 20.0000 mg | PACK | Freq: Every morning | ORAL | Status: DC

## 2024-02-19 MED ORDER — ASPIRIN 81 MG PO TBEC
81.0000 mg | DELAYED_RELEASE_TABLET | Freq: Every day | ORAL | Status: DC
  Administered 2024-02-19 – 2024-02-20 (×2): 81 mg via ORAL
  Filled 2024-02-19 (×2): qty 1

## 2024-02-19 MED ORDER — DULOXETINE HCL 30 MG PO CPEP
30.0000 mg | ORAL_CAPSULE | Freq: Every day | ORAL | Status: DC
  Administered 2024-02-19 – 2024-02-20 (×2): 30 mg via ORAL
  Filled 2024-02-19 (×2): qty 1

## 2024-02-19 MED ORDER — CEFAZOLIN SODIUM-DEXTROSE 2-4 GM/100ML-% IV SOLN
2.0000 g | Freq: Three times a day (TID) | INTRAVENOUS | Status: AC
  Administered 2024-02-19 – 2024-02-20 (×2): 2 g via INTRAVENOUS
  Filled 2024-02-19 (×2): qty 100

## 2024-02-19 MED ORDER — PHENYLEPHRINE 80 MCG/ML (10ML) SYRINGE FOR IV PUSH (FOR BLOOD PRESSURE SUPPORT)
PREFILLED_SYRINGE | INTRAVENOUS | Status: AC
  Filled 2024-02-19: qty 10

## 2024-02-19 MED ORDER — ACETAMINOPHEN 650 MG RE SUPP: 650.0000 mg | RECTAL | Status: DC | PRN

## 2024-02-19 MED ORDER — SODIUM CHLORIDE 0.9% FLUSH
3.0000 mL | Freq: Two times a day (BID) | INTRAVENOUS | Status: DC
  Administered 2024-02-19: 3 mL via INTRAVENOUS

## 2024-02-19 MED ORDER — DEXAMETHASONE SOD PHOSPHATE PF 10 MG/ML IJ SOLN
INTRAMUSCULAR | Status: DC | PRN
  Administered 2024-02-19: 5 mg via INTRAVENOUS

## 2024-02-19 MED ORDER — CHLORHEXIDINE GLUCONATE CLOTH 2 % EX PADS: 6.0000 | MEDICATED_PAD | Freq: Once | CUTANEOUS | Status: DC

## 2024-02-19 MED ORDER — MORPHINE SULFATE (PF) 2 MG/ML IV SOLN: 2.0000 mg | INTRAVENOUS | Status: DC | PRN

## 2024-02-19 MED ORDER — METHOCARBAMOL 500 MG PO TABS
ORAL_TABLET | ORAL | Status: AC
  Filled 2024-02-19: qty 1

## 2024-02-19 MED ORDER — TAMSULOSIN HCL 0.4 MG PO CAPS
0.4000 mg | ORAL_CAPSULE | Freq: Every day | ORAL | Status: DC
  Administered 2024-02-20: 0.4 mg via ORAL
  Filled 2024-02-19: qty 1

## 2024-02-19 MED ORDER — SUGAMMADEX SODIUM 200 MG/2ML IV SOLN
INTRAVENOUS | Status: DC | PRN
  Administered 2024-02-19: 200 mg via INTRAVENOUS

## 2024-02-19 MED ORDER — TRAZODONE HCL 100 MG PO TABS
100.0000 mg | ORAL_TABLET | Freq: Every day | ORAL | Status: DC
  Administered 2024-02-19: 100 mg via ORAL
  Filled 2024-02-19: qty 1

## 2024-02-19 MED ORDER — PANTOPRAZOLE SODIUM 40 MG PO TBEC
40.0000 mg | DELAYED_RELEASE_TABLET | Freq: Every day | ORAL | Status: DC
  Administered 2024-02-20: 40 mg via ORAL
  Filled 2024-02-19: qty 1

## 2024-02-19 MED ORDER — BUPIVACAINE-EPINEPHRINE (PF) 0.25% -1:200000 IJ SOLN
INTRAMUSCULAR | Status: AC
Start: 2024-02-19 — End: 2024-02-19
  Filled 2024-02-19: qty 30

## 2024-02-19 MED ORDER — METHOCARBAMOL 500 MG PO TABS
500.0000 mg | ORAL_TABLET | Freq: Four times a day (QID) | ORAL | Status: DC | PRN
  Administered 2024-02-19 – 2024-02-20 (×3): 500 mg via ORAL
  Filled 2024-02-19 (×2): qty 1

## 2024-02-19 MED ORDER — THROMBIN 20000 UNITS EX SOLR
CUTANEOUS | Status: AC
  Filled 2024-02-19: qty 20000

## 2024-02-19 MED ORDER — PROPOFOL 10 MG/ML IV BOLUS
INTRAVENOUS | Status: AC
Start: 2024-02-19 — End: 2024-02-19
  Filled 2024-02-19: qty 20

## 2024-02-19 MED ORDER — LACTATED RINGERS IV SOLN: INTRAVENOUS | Status: DC

## 2024-02-19 MED ORDER — THROMBIN 5000 UNITS EX SOLR
OROMUCOSAL | Status: DC | PRN
  Administered 2024-02-19: 5 mL via TOPICAL

## 2024-02-19 MED ORDER — 0.9 % SODIUM CHLORIDE (POUR BTL) OPTIME
TOPICAL | Status: DC | PRN
  Administered 2024-02-19: 1000 mL

## 2024-02-19 MED ORDER — SURGIPHOR WOUND IRRIGATION SYSTEM - OPTIME
TOPICAL | Status: DC | PRN
Start: 2024-02-19 — End: 2024-02-19
  Administered 2024-02-19: 450 mL via TOPICAL

## 2024-02-19 MED ORDER — ONDANSETRON HCL 4 MG/2ML IJ SOLN
INTRAMUSCULAR | Status: AC
  Filled 2024-02-19: qty 2

## 2024-02-19 MED ORDER — PHENOL 1.4 % MT LIQD: 1.0000 | OROMUCOSAL | Status: DC | PRN

## 2024-02-19 MED ORDER — FENTANYL CITRATE (PF) 100 MCG/2ML IJ SOLN
25.0000 ug | INTRAMUSCULAR | Status: DC | PRN
  Administered 2024-02-19 (×4): 25 ug via INTRAVENOUS

## 2024-02-19 SURGICAL SUPPLY — 45 items
ALLOGRAFT BONE FIBER KORE 5 (Bone Implant) IMPLANT
BAG COUNTER SPONGE SURGICOUNT (BAG) ×1 IMPLANT
BASKET BONE COLLECTION (BASKET) ×1 IMPLANT
BENZOIN TINCTURE PRP APPL 2/3 (GAUZE/BANDAGES/DRESSINGS) ×1 IMPLANT
BLADE BONE MILL MEDIUM (MISCELLANEOUS) ×1 IMPLANT
BUR CARBIDE MATCH 3.0 (BURR) ×1 IMPLANT
CANISTER SUCTION 3000ML PPV (SUCTIONS) ×1 IMPLANT
CLSR STERI-STRIP ANTIMIC 1/2X4 (GAUZE/BANDAGES/DRESSINGS) IMPLANT
CNTNR URN SCR LID CUP LEK RST (MISCELLANEOUS) ×1 IMPLANT
COVER BACK TABLE 60X90IN (DRAPES) ×1 IMPLANT
DRAPE C-ARM 42X72 X-RAY (DRAPES) ×2 IMPLANT
DRAPE C-ARMOR (DRAPES) ×1 IMPLANT
DRAPE LAPAROTOMY 100X72X124 (DRAPES) ×1 IMPLANT
DRAPE SURG 17X23 STRL (DRAPES) ×1 IMPLANT
DRESSING AQUACEL AG SP 3.5X6 (GAUZE/BANDAGES/DRESSINGS) IMPLANT
DRSG AQUACEL AG ADV 3.5X 6 (GAUZE/BANDAGES/DRESSINGS) ×2 IMPLANT
DURAPREP 26ML APPLICATOR (WOUND CARE) ×1 IMPLANT
ELECTRODE REM PT RTRN 9FT ADLT (ELECTROSURGICAL) ×1 IMPLANT
EVACUATOR 1/8 PVC DRAIN (DRAIN) ×1 IMPLANT
GLOVE BIO SURGEON STRL SZ8 (GLOVE) ×2 IMPLANT
GOWN STRL REUS W/ TWL XL LVL3 (GOWN DISPOSABLE) ×2 IMPLANT
HEMOSTAT POWDER KIT SURGIFOAM (HEMOSTASIS) ×1 IMPLANT
KIT BASIN OR (CUSTOM PROCEDURE TRAY) ×1 IMPLANT
KIT TURNOVER KIT B (KITS) ×1 IMPLANT
MILL BONE PREP (MISCELLANEOUS) ×1 IMPLANT
NDL HYPO 25X1 1.5 SAFETY (NEEDLE) ×1 IMPLANT
NEEDLE HYPO 25X1 1.5 SAFETY (NEEDLE) ×1 IMPLANT
PACK LAMINECTOMY NEURO (CUSTOM PROCEDURE TRAY) ×1 IMPLANT
PAD ARMBOARD POSITIONER FOAM (MISCELLANEOUS) ×3 IMPLANT
ROD LORD LIPPED TI 5.5X35 (Rod) IMPLANT
SCREW CORT SHANK MOD 6.5X40 (Screw) IMPLANT
SCREW KODIAK 6.5X40 (Screw) IMPLANT
SCREW POLYAXIAL TULIP (Screw) IMPLANT
SET SCREW SPNE (Screw) IMPLANT
SOLN 0.9% NACL POUR BTL 1000ML (IV SOLUTION) ×1 IMPLANT
SOLN STERILE WATER BTL 1000 ML (IV SOLUTION) ×1 IMPLANT
SOLUTION IRRIG SURGIPHOR (IV SOLUTION) ×1 IMPLANT
SPACER IDENTITI PS 11X9X25 10D (Spacer) IMPLANT
SPONGE SURGIFOAM ABS GEL 100 (HEMOSTASIS) ×1 IMPLANT
STRIP CLOSURE SKIN 1/2X4 (GAUZE/BANDAGES/DRESSINGS) ×1 IMPLANT
SUT VIC AB 0 CT1 18XCR BRD8 (SUTURE) ×1 IMPLANT
SUT VIC AB 2-0 CP2 18 (SUTURE) ×1 IMPLANT
SUT VIC AB 3-0 SH 8-18 (SUTURE) ×2 IMPLANT
TOWEL GREEN STERILE (TOWEL DISPOSABLE) ×1 IMPLANT
TOWEL GREEN STERILE FF (TOWEL DISPOSABLE) ×1 IMPLANT

## 2024-02-19 NOTE — Anesthesia Procedure Notes (Signed)
 Procedure Name: Intubation Date/Time: 02/19/2024 10:45 AM  Performed by: Hedy Jarred, CRNAPre-anesthesia Checklist: Patient identified, Emergency Drugs available, Suction available and Patient being monitored Patient Re-evaluated:Patient Re-evaluated prior to induction Oxygen Delivery Method: Circle System Utilized Preoxygenation: Pre-oxygenation with 100% oxygen Induction Type: IV induction Ventilation: Mask ventilation without difficulty Laryngoscope Size: Miller and 2 Grade View: Grade II Tube type: Oral Tube size: 7.5 mm Number of attempts: 1 Airway Equipment and Method: Stylet and Oral airway Placement Confirmation: ETT inserted through vocal cords under direct vision, positive ETCO2 and breath sounds checked- equal and bilateral Secured at: 23 cm Tube secured with: Tape Dental Injury: Teeth and Oropharynx as per pre-operative assessment

## 2024-02-19 NOTE — Op Note (Signed)
 02/19/2024  1:09 PM  PATIENT:  Drew Andrews  78 y.o. male  PRE-OPERATIVE DIAGNOSIS: Spondylolisthesis L4-5 with severe spinal stenosis and synovial cyst formation, back pain with radiculopathy  POST-OPERATIVE DIAGNOSIS:  same  PROCEDURE:   1. Decompressive lumbar laminectomy, hemi facetectomy and foraminotomies L4-5 for resection of synovial cyst requiring more work than would be required for a simple exposure of the disk for PLIF in order to adequately decompress the neural elements and address the spinal stenosis 2. Posterior lumbar interbody fusion L4-5 using PTI interbody cages packed with morcellized allograft and autograft  3. Posterior fixation L4-5 using ATEC cortical pedicle screws.  4. Intertransverse arthrodesis L4-5 using morcellized autograft and allograft.  SURGEON:  Alm Molt, MD  ASSISTANTSBETHA Pean FNP  ANESTHESIA:  General  EBL: 150 ml  Total I/O In: -  Out: 150 [Blood:150]  BLOOD ADMINISTERED:none  DRAINS: none   INDICATION FOR PROCEDURE: This patient presented with back and leg pain. Imaging revealed spondylolisthesis L4-5 with spinal stenosis and synovial cyst at L4-5 in the right. The patient tried a reasonable attempt at conservative medical measures without relief. I recommended decompression and instrumented fusion to address the stenosis as well as the segmental  instability.  Patient understood the risks, benefits, and alternatives and potential outcomes and wished to proceed.  PROCEDURE DETAILS:  The patient was brought to the operating room. After induction of generalized endotracheal anesthesia the patient was rolled into the prone position on chest rolls and all pressure points were padded. The patient's lumbar region was cleaned and then prepped with DuraPrep and draped in the usual sterile fashion. Anesthesia was injected and then a dorsal midline incision was made and carried down to the lumbosacral fascia. The fascia was opened and  the paraspinous musculature was taken down in a subperiosteal fashion to expose L4-5. A self-retaining retractor was placed. Intraoperative fluoroscopy confirmed my level, and I started with placement of the L4 cortical pedicle screws. The pedicle screw entry zones were identified utilizing surface landmarks and  AP and lateral fluoroscopy. I scored the cortex with the high-speed drill and then used the hand drill to drill an upward and outward direction into the pedicle. I then tapped line to line. I then placed a 6.5 x 40 mm cortical pedicle screw into the pedicles of L4 bilaterally.    I then turned my attention to the decompression and complete lumbar laminectomies, hemi- facetectomies, and foraminotomies were performed at L4-5.  My nurse practitioner was directly involved in the decompression and exposure of the neural elements. the patient had significant spinal stenosis and this required more work than would be required for a simple exposure of the disc for posterior lumbar interbody fusion which would only require a limited laminotomy. Much more generous decompression and generous foraminotomy was undertaken in order to adequately decompress the neural elements and address the patient's leg pain. The yellow ligament was removed to expose the underlying dura and nerve roots, and generous foraminotomies were performed to adequately decompress the neural elements.  There was a large synovial cyst at L4-5 on the right and we spent considerable time dissecting it away from the dura and trying to remove it is much as we could.  We decompressed with the L4 and the L5 nerve roots.  Both the exiting and traversing nerve roots were decompressed on both sides until a coronary dilator passed easily along the nerve roots. Once the decompression was complete, I turned my attention to the posterior lower lumbar interbody  fusion. The epidural venous vasculature was coagulated and cut sharply. Disc space was incised and  the initial discectomy was performed with pituitary rongeurs. The disc space was distracted with sequential distractors to a height of 11 mm. We then used a series of scrapers and shavers to prepare the endplates for fusion. The midline was prepared with Epstein curettes. Once the complete discectomy was finished, we packed an appropriate sized interbody cage with local autograft and morcellized allograft, gently retracted the nerve root, and tapped the cage into position at L4-5.  The midline between the cages was packed with morselized autograft and allograft.   We then turned our attention to the placement of the lower pedicle screws. The pedicle screw entry zones were identified utilizing surface landmarks and fluoroscopy. I drilled into each pedicle utilizing the hand drill, and tapped each pedicle with the appropriate tap. We palpated with a ball probe to assure no break in the cortex. We then placed 6.5 x 40 mm pedicle screws into the pedicles bilaterally at L5.  My nurse practitioner assisted in placement of the pedicle screws.  We then decorticated the transverse processes and laid a mixture of morcellized autograft and allograft out over these to perform intertransverse arthrodesis at L4-5. We then placed lordotic rods into the multiaxial screw heads of the pedicle screws and locked these in position with the locking caps and anti-torque device. We then checked our construct with AP and lateral fluoroscopy. Irrigated with copious amounts of 0.5% povidone iodine solution followed by saline solution. Inspected the nerve roots once again to assure adequate decompression, lined to the dura with Gelfoam,  and then we closed the muscle and the fascia with 0 Vicryl. Closed the subcutaneous tissues with 2-0 Vicryl and subcuticular tissues with 3-0 Vicryl. The skin was closed with benzoin and Steri-Strips. Dressing was then applied, the patient was awakened from general anesthesia and transported to the recovery  room in stable condition. At the end of the procedure all sponge, needle and instrument counts were correct.   PLAN OF CARE: admit to inpatient  PATIENT DISPOSITION:  PACU - hemodynamically stable.   Delay start of Pharmacological VTE agent (>24hrs) due to surgical blood loss or risk of bleeding:  yes

## 2024-02-19 NOTE — H&P (Signed)
 Subjective: Patient is a 78 y.o. male admitted for spondylolisthesis. Onset of symptoms was several year ago, gradually worsening since that time.  The pain is rated severe, and is located at the across the lower back and radiates to legs. The pain is described as aching and occurs intermittently. The symptoms have been progressive. Symptoms are exacerbated by exercise, standing, and walking for more than a few minutes. MRI or CT showed spondylolisthesis with stenosis L4-5   Past Medical History:  Diagnosis Date   Arthritis    Ascending aorta dilation 09/14/2023   TTE 09/13/2023: EF 60-65, no RWMA, GR 1 DD, normal RVSF, trivial MR, moderate MAC, AVR with normal structure and function, mild dilation of ascending aorta (40 mm)    Bursitis of elbow 05/20/2013   Coronary artery disease    Nonobstructive   Depression    situational   Essential hypertension 03/12/2013   GERD (gastroesophageal reflux disease)    Heart murmur    s/p  aortic valve replacement surgery by Dr. Dorise Fellers 05/13/2013   Hyperlipidemia    Hypertension    Left ventricular diastolic dysfunction, NYHA class 2 03/12/2013   LVH (left ventricular hypertrophy)    with aortic stenosis-bicuspid   Neuromuscular disorder (HCC)    Parkinson's disease (HCC)    Pneumonia 2024   PONV (postoperative nausea and vomiting)    as a child   S/P AVR (aortic valve replacement) 05/13/2013   S/P AVR, 05/13/13, 25 mm Edwards Magna-Ease pericardial valve 05/13/2013   Seasonal allergies    Severe aortic stenosis 03/12/2013    Past Surgical History:  Procedure Laterality Date   AORTIC VALVE REPLACEMENT N/A 05/13/2013   Procedure: AORTIC VALVE REPLACEMENT (AVR);  Surgeon: Dorise MARLA Fellers, MD;  Location: [facility] OR;  Service: Open Heart Surgery;  Laterality: N/A;   APPENDECTOMY  age 57   CARDIAC CATHETERIZATION     COLONOSCOPY     X 2   HIATAL HERNIA REPAIR     INTRAOPERATIVE TRANSESOPHAGEAL ECHOCARDIOGRAM N/A 05/13/2013   Procedure:  INTRAOPERATIVE TRANSESOPHAGEAL ECHOCARDIOGRAM;  Surgeon: Dorise MARLA Fellers, MD;  Location: [facility] OR;  Service: Open Heart Surgery;  Laterality: N/A;   LEFT HEART CATHETERIZATION WITH CORONARY ANGIOGRAM N/A 03/15/2013   Procedure: LEFT HEART CATHETERIZATION WITH CORONARY ANGIOGRAM;  Surgeon: Debby DELENA Sor, MD;  Location: [facility] CATH LAB;  Service: Cardiovascular;  Laterality: N/A;   TONSILLECTOMY     TRANSTHORACIC ECHOCARDIOGRAM  03/04/2013   EF 55-60%, grade 2 diastolic dysfunction, AV with mod calcified leaflets & mild regurg, calcified MV annulus, LA mod dilated, RV mildly dilated    Prior to Admission medications   Medication Sig Start Date End Date Taking? Authorizing Provider  aspirin  81 MG tablet Take 81 mg by mouth at bedtime.   Yes [provider]  carbidopa -levodopa  (SINEMET  IR) 25-100 MG tablet Take 1 tablet by mouth 4 (four) times daily. 10/23/23  Yes Lomax, Amy, NP  carvedilol  (COREG ) 6.25 MG tablet Take 1 tablet (6.25 mg total) by mouth 2 (two) times daily. 08/09/23  Yes Weaver, Scott T, PA-C  diphenhydrAMINE (BENADRYL) 25 MG tablet Take 25 mg by mouth every 6 (six) hours as needed. Patient taking differently: Take 25 mg by mouth at bedtime.   Yes [provider]  DULoxetine  (CYMBALTA ) 30 MG capsule Take 30 mg by mouth at bedtime.   Yes [provider]  esomeprazole (NEXIUM) 20 MG packet Take 20 mg by mouth every morning. Takes at night   Yes [provider]  ezetimibe  (ZETIA ) 10 MG tablet Take 1 tablet (10 mg total) by mouth daily. Patient taking differently: Take 10 mg by mouth at bedtime. 01/16/24  Yes Weaver, Scott T, PA-C  gabapentin  (NEURONTIN ) 300 MG capsule Take 600 mg by mouth at bedtime. 08/26/23  Yes [provider]  lisinopril  (ZESTRIL ) 40 MG tablet Take 1 tablet (40 mg total) by mouth daily. 08/09/23  Yes Weaver, Scott T, PA-C  omeprazole (PRILOSEC OTC) 20 MG tablet Take 20 mg by mouth daily.   Yes [provider]   oxyCODONE -acetaminophen  (PERCOCET/ROXICET) 5-325 MG tablet Take 1 tablet by mouth every 8 (eight) hours as needed for severe pain (pain score 7-10). 01/09/24  Yes [provider]  rosuvastatin  (CRESTOR ) 40 MG tablet Take 1 tablet (40 mg total) by mouth daily. Patient taking differently: Take 40 mg by mouth at bedtime. 08/09/23  Yes Weaver, Scott T, PA-C  tamsulosin (FLOMAX) 0.4 MG CAPS capsule Take 0.4 mg by mouth at bedtime. 02/08/23  Yes [provider]  Tetrahydrozoline-Zn Sulfate (ALLERGY RELIEF EYE DROPS OP) Place 1-2 drops into both eyes 3 (three) times daily as needed (allergy eyes.).   Yes [provider]  traZODone  (DESYREL ) 50 MG tablet TAKE 2 TABLETS BY MOUTH AT BEDTIME. 05/09/23  Yes Onita Duos, MD  ibuprofen (ADVIL) 200 MG tablet Take 400-800 mg by mouth every 8 (eight) hours as needed (pain.).    [provider]   Allergies  Allergen Reactions   Scopolamine     seizure    Social History   Tobacco Use   Smoking status: Former    Current packs/day: 0.00    Types: Cigarettes    Quit date: 02/10/1974    Years since quitting: 50.0   Smokeless tobacco: Never  Substance Use Topics   Alcohol  use: Not Currently    Comment: Drinks 1 bottle of wine daily and 1 beer    Family History  Problem Relation Age of Onset   Hypertension Mother    Hyperlipidemia Mother    Diabetes Mother    Heart attack Father    Acute myelogenous leukemia Brother    Pneumonia Maternal Grandmother    Heart attack Maternal Grandfather    Colon cancer Neg Hx    Stomach cancer Neg Hx      Review of Systems  Positive ROS: neg  All other systems have been reviewed and were otherwise negative with the exception of those mentioned in the HPI and as above.  Objective: Vital signs in last 24 hours: Temp:  [98.4 F (36.9 C)] 98.4 F (36.9 C) (10/20 0804) Pulse Rate:  [66] 66 (10/20 0804) Resp:  [18] 18 (10/20 0804) BP: (106)/(69) 106/69 (10/20 0804) SpO2:  [96 %] 96  % (10/20 0804)  General Appearance: Alert, cooperative, no distress, appears stated age Head: Normocephalic, without obvious abnormality, atraumatic Eyes: PERRL, conjunctiva/corneas clear, EOM's intact    Neck: Supple, symmetrical, trachea midline Back: Symmetric, no curvature, ROM normal, no CVA tenderness Lungs:  respirations unlabored Heart: Regular rate and rhythm Abdomen: Soft, non-tender Extremities: Extremities normal, atraumatic, no cyanosis or edema Pulses: 2+ and symmetric all extremities Skin: Skin color, texture, turgor normal, no rashes or lesions  NEUROLOGIC:   Mental status: Alert and oriented x4,  no aphasia, good attention span, fund of knowledge, and memory Motor Exam - grossly normal Sensory Exam - grossly normal Reflexes: 1= Coordination - grossly normal Gait - grossly normal Balance - grossly normal Cranial Nerves: I: smell Not tested  II: visual  acuity  OS: nl    OD: nl  II: visual fields Full to confrontation  II: pupils Equal, round, reactive to light  III,VII: ptosis None  III,IV,VI: extraocular muscles  Full ROM  V: mastication Normal  V: facial light touch sensation  Normal  V,VII: corneal reflex  Present  VII: facial muscle function - upper  Normal  VII: facial muscle function - lower Normal  VIII: hearing Not tested  IX: soft palate elevation  Normal  IX,X: gag reflex Present  XI: trapezius strength  5/5  XI: sternocleidomastoid strength 5/5  XI: neck flexion strength  5/5  XII: tongue strength  Normal    Data Review Lab Results  Component Value Date   WBC 6.8 02/13/2024   HGB 12.9 (L) 02/13/2024   HCT 37.6 (L) 02/13/2024   MCV 103.0 (H) 02/13/2024   PLT 137 (L) 02/13/2024   Lab Results  Component Value Date   NA 136 02/13/2024   K 4.2 02/13/2024   CL 102 02/13/2024   CO2 22 02/13/2024   BUN 18 02/13/2024   CREATININE 1.12 02/13/2024   GLUCOSE 93 02/13/2024   Lab Results  Component Value Date   INR 1.41 05/13/2013     Assessment/Plan:  Estimated body mass index is 26.14 kg/m as calculated from the following:   Height as of 02/13/24: 5' 9 (1.753 m).   Weight as of 02/13/24: 80.3 kg. Patient admitted for PLIF L4-5. Patient has failed a reasonable attempt at conservative therapy.  I explained the condition and procedure to the patient and answered any questions.  Patient wishes to proceed with procedure as planned. Understands risks/ benefits and typical outcomes of procedure.   Drew Andrews 02/19/2024 9:59 AM

## 2024-02-19 NOTE — Progress Notes (Signed)
 Orthopedic Tech Progress Note Patient Details:  Drew Andrews 1945/12/23 [id number]  Ortho Devices Type of Ortho Device: Lumbar corsett Ortho Device/Splint Location: BACK Ortho Device/Splint Interventions: Ordered, Application, Adjustment, Removal   Post Interventions Patient Tolerated: Well Instructions Provided: Care of device  [name] Pac 02/19/2024, 4:50 PM

## 2024-02-19 NOTE — Transfer of Care (Signed)
 Immediate Anesthesia Transfer of Care Note  Patient: Drew Andrews  Procedure(s) Performed: POSTERIOR LUMBAR FUSION 1 LEVEL (Back)  Patient Location: PACU  Anesthesia Type:General  Level of Consciousness: drowsy  Airway & Oxygen Therapy: Patient Spontanous Breathing  Post-op Assessment: Report given to RN and Post -op Vital signs reviewed and stable  Post vital signs: Reviewed and stable  Last Vitals:  Vitals Value Taken Time  BP 146/77 02/19/24 13:50  Temp 37 C 02/19/24 13:35  Pulse 59 02/19/24 13:51  Resp 14 02/19/24 13:51  SpO2 96 % 02/19/24 13:51  Vitals shown include unfiled device data.  Last Pain:  Vitals:   02/19/24 1335  TempSrc:   PainSc: 3       Patients Stated Pain Goal: 3 (02/19/24 1335)  Complications: No notable events documented.

## 2024-02-20 DIAGNOSIS — M48061 Spinal stenosis, lumbar region without neurogenic claudication: Secondary | ICD-10-CM | POA: Diagnosis not present

## 2024-02-20 MED ORDER — OXYCODONE-ACETAMINOPHEN 5-325 MG PO TABS: 1.0000 | ORAL_TABLET | ORAL | 0 refills | Status: AC | PRN

## 2024-02-20 NOTE — Discharge Summary (Signed)
 Physician Discharge Summary  Patient ID: Drew Andrews MRN: [id number] DOB/AGE: 01/23/1946 78 y.o.  Admit date: 02/19/2024 Discharge date: 02/20/2024  Admission Diagnoses: spondylolisthesis with stenosis and synovial cyst L4-5    Discharge Diagnoses: same   Discharged Condition: good  Hospital Course: The patient was admitted on 02/19/2024 and taken to the operating room where the patient underwent PLIF L4-5. The patient tolerated the procedure well and was taken to the recovery room and then to the floor in stable condition. The hospital course was routine. There were no complications. The wound remained clean dry and intact. Pt had appropriate back soreness. No complaints of leg pain or new N/T/W. The patient remained afebrile with stable vital signs, and tolerated a regular diet. The patient continued to increase activities, and pain was well controlled with oral pain medications.   Consults: None  Significant Diagnostic Studies:  Results for orders placed or performed during the hospital encounter of 02/13/24  Surgical pcr screen   Collection Time: 02/13/24  2:33 PM   Specimen: Nasal Mucosa; Nasal Swab  Result Value Ref Range   MRSA, PCR NEGATIVE NEGATIVE   Staphylococcus aureus NEGATIVE NEGATIVE  Type and screen   Collection Time: 02/13/24  2:55 PM  Result Value Ref Range   ABO/RH(D) A POS    Antibody Screen NEG    Sample Expiration 02/27/2024,2359    Extend sample reason      NO TRANSFUSIONS OR PREGNANCY IN THE PAST 3 MONTHS Performed at [facility], [address], [place], [place] [address]   Comprehensive metabolic panel per protocol   Collection Time: 02/13/24  3:00 PM  Result Value Ref Range   Sodium 136 135 - 145 mmol/L   Potassium 4.2 3.5 - 5.1 mmol/L   Chloride 102 98 - 111 mmol/L   CO2 22 22 - 32 mmol/L   Glucose, Bld 93 70 - 99 mg/dL   BUN 18 8 - 23 mg/dL   Creatinine, Ser 8.87 0.61 - 1.24 mg/dL   Calcium  9.0 8.9 - 10.3 mg/dL   Total  Protein 7.1 6.5 - 8.1 g/dL   Albumin  4.4 3.5 - 5.0 g/dL   AST 20 15 - 41 U/L   ALT 10 0 - 44 U/L   Alkaline Phosphatase 38 38 - 126 U/L   Total Bilirubin 0.6 0.0 - 1.2 mg/dL   GFR, Estimated >39 >39 mL/min   Anion gap 12 5 - 15  CBC per protocol   Collection Time: 02/13/24  3:00 PM  Result Value Ref Range   WBC 6.8 4.0 - 10.5 K/uL   RBC 3.65 (L) 4.22 - 5.81 MIL/uL   Hemoglobin 12.9 (L) 13.0 - 17.0 g/dL   HCT 62.3 (L) 60.9 - 47.9 %   MCV 103.0 (H) 80.0 - 100.0 fL   MCH 35.3 (H) 26.0 - 34.0 pg   MCHC 34.3 30.0 - 36.0 g/dL   RDW 87.9 88.4 - 84.4 %   Platelets 137 (L) 150 - 400 K/uL   nRBC 0.0 0.0 - 0.2 %    DG Lumbar Spine 2-3 Views Result Date: 02/19/2024 CLINICAL DATA:  Elective surgery. EXAM: DG LUMBAR SPINE 2-3V COMPARISON:  Preoperative imaging. FINDINGS: Four fluoroscopic spot views of the lumbar spine submitted from the operating room. Posterior rod and pedicle screw fixation with interbody spacer at L4-L5. Fluoroscopy time 42.6 seconds. Dose 28.57 mGy. IMPRESSION: Intraoperative fluoroscopy during lumbar fusion. Electronically Signed   By: Andrea Gasman M.D.   On: 02/19/2024 15:41  DG C-Arm 1-60 Min-No Report Result Date: 02/19/2024 Fluoroscopy was utilized by the requesting physician.  No radiographic interpretation.   DG C-Arm 1-60 Min-No Report Result Date: 02/19/2024 Fluoroscopy was utilized by the requesting physician.  No radiographic interpretation.    Antibiotics:  Anti-infectives (From admission, onward)    Start     Dose/Rate Route Frequency Ordered Stop   02/19/24 1900  ceFAZolin (ANCEF) IVPB 2g/100 mL premix        2 g 200 mL/hr over 30 Minutes Intravenous Every 8 hours 02/19/24 1538 02/20/24 0254   02/19/24 0800  ceFAZolin (ANCEF) IVPB 2g/100 mL premix        2 g 200 mL/hr over 30 Minutes Intravenous On call to O.R. 02/19/24 0746 02/19/24 1056       Discharge Exam: Blood pressure 110/63, pulse (!) 102, temperature 98.3 F (36.8 C), temperature  source Oral, resp. rate 17, SpO2 97%. Neurologic: Grossly normal Dressing dry  Discharge Medications:   Allergies as of 02/20/2024       Reactions   Scopolamine    seizure        Medication List     TAKE these medications    ALLERGY RELIEF EYE DROPS OP Place 1-2 drops into both eyes 3 (three) times daily as needed (allergy eyes.).   aspirin  81 MG tablet Take 81 mg by mouth at bedtime.   carbidopa -levodopa  25-100 MG tablet Commonly known as: SINEMET  IR Take 1 tablet by mouth 4 (four) times daily.   carvedilol  6.25 MG tablet Commonly known as: COREG  Take 1 tablet (6.25 mg total) by mouth 2 (two) times daily.   diphenhydrAMINE 25 MG tablet Commonly known as: BENADRYL Take 25 mg by mouth every 6 (six) hours as needed. What changed: when to take this   DULoxetine  30 MG capsule Commonly known as: CYMBALTA  Take 30 mg by mouth at bedtime.   esomeprazole 20 MG packet Commonly known as: NEXIUM Take 20 mg by mouth every morning. Takes at night   ezetimibe  10 MG tablet Commonly known as: ZETIA  Take 1 tablet (10 mg total) by mouth daily. What changed: when to take this   gabapentin  300 MG capsule Commonly known as: NEURONTIN  Take 600 mg by mouth at bedtime.   ibuprofen 200 MG tablet Commonly known as: ADVIL Take 400-800 mg by mouth every 8 (eight) hours as needed (pain.).   lisinopril  40 MG tablet Commonly known as: ZESTRIL  Take 1 tablet (40 mg total) by mouth daily.   omeprazole 20 MG tablet Commonly known as: PRILOSEC OTC Take 20 mg by mouth daily.   oxyCODONE -acetaminophen  5-325 MG tablet Commonly known as: PERCOCET/ROXICET Take 1 tablet by mouth every 4 (four) hours as needed for severe pain (pain score 7-10). What changed: when to take this   rosuvastatin  40 MG tablet Commonly known as: CRESTOR  Take 1 tablet (40 mg total) by mouth daily. What changed: when to take this   tamsulosin 0.4 MG Caps capsule Commonly known as: FLOMAX Take 0.4 mg by  mouth at bedtime.   traZODone  50 MG tablet Commonly known as: DESYREL  TAKE 2 TABLETS BY MOUTH AT BEDTIME.               Durable Medical Equipment  (From admission, onward)           Start     Ordered   02/19/24 1539  DME Walker rolling  Once       Question:  Patient needs a walker to treat with the following  condition  Answer:  S/P lumbar fusion   02/19/24 1538   02/19/24 1539  DME 3 n 1  Once        02/19/24 1538              Discharge Care Instructions  (From admission, onward)           Start     Ordered   02/20/24 0000  Discharge wound care:       Comments: Remove dressing on back in 7 days, may shower normally   02/20/24 9256            Disposition: home   Final Dx: plif L4-5  Discharge Instructions     Call MD for:  difficulty breathing, headache or visual disturbances   Complete by: As directed    Call MD for:  persistant nausea and vomiting   Complete by: As directed    Call MD for:  redness, tenderness, or signs of infection (pain, swelling, redness, odor or green/yellow discharge around incision site)   Complete by: As directed    Call MD for:  severe uncontrolled pain   Complete by: As directed    Call MD for:  temperature >100.4   Complete by: As directed    Diet - low sodium heart healthy   Complete by: As directed    Discharge instructions   Complete by: As directed    No heavy lifting or strenuous activity   Discharge wound care:   Complete by: As directed    Remove dressing on back in 7 days, may shower normally   Increase activity slowly   Complete by: As directed         Follow-up Information     Joshua Alm Hamilton, MD. Schedule an appointment as soon as possible for a visit in 2 week(s).   Specialty: Neurosurgery Contact information: [address] [place] [address] [phone]                  Signed: Alm GORMAN Joshua 02/20/2024, 7:44 AM

## 2024-02-20 NOTE — Evaluation (Signed)
 Occupational Therapy Evaluation Patient Details Name: Drew Andrews MRN: [id number] DOB: 1945/10/16 Today's Date: 02/20/2024   History of Present Illness   78 y.o. male presents to [facility] on 02/19/2024 for L4-5 PLIF. PMH includes HTN, GERD, HLD, AVR.     Clinical Impressions Pt received in supine, agreeable for OT visit. PTA, he was driving and indep with ADLs, iADLs, and mobility without AD. Lives with his wife & daughter (daughter works). Pt presenting today with minimal pain, flat affect, and poor problem solving/processing. Functionally, he required min A for bed mobility, and CGA for transfers + ambulation via RW. Required max A for LB dressing and mod A for LSO brace mgmt/UB dressing. Classic Parkinsonian traits - shuffled, small steps especially when turning, mild bradykinesia, and balance deficits. Extensive time spent reviewing functional implications of spinal precautions and handout provided by PT.  Pt is currently functioning below baseline and would benefit from ongoing acute OT services to progress towards safe discharge and to facilitate return to prior level of function. Current recommendation is home with home health OT.     If plan is discharge home, recommend the following:   A lot of help with bathing/dressing/bathroom;Direct supervision/assist for medications management;Direct supervision/assist for financial management;Assist for transportation;Supervision due to cognitive status     Functional Status Assessment   Patient has had a recent decline in their functional status and demonstrates the ability to make significant improvements in function in a reasonable and predictable amount of time.     Equipment Recommendations   None recommended by OT     Recommendations for Other Services   PT consult (per orderset)     Precautions/Restrictions   Precautions Precautions: Fall;Back Precaution Booklet Issued: Yes (comment) Recall of  Precautions/Restrictions: Impaired Precaution/Restrictions Comments: frequent cues to maintain throughout session Required Braces or Orthoses: Spinal Brace Spinal Brace: Lumbar corset;Applied in sitting position Restrictions Weight Bearing Restrictions Per Provider Order: No     Mobility Bed Mobility Overal bed mobility: Needs Assistance Bed Mobility: Rolling, Sidelying to Sit Rolling: Min assist Sidelying to sit: Min assist       General bed mobility comments: Assist for trunk elevation with bed flat, no use of rails, cues for log roll technique    Transfers Overall transfer level: Needs assistance Equipment used: Rolling walker (2 wheels) Transfers: Sit to/from Stand, Bed to chair/wheelchair/BSC Sit to Stand: Contact guard assist     Step pivot transfers: Contact guard assist     General transfer comment: Stood with multiple attempts from EOB, initially retropulsive, cues for safe hand placement      Balance Overall balance assessment: Needs assistance Sitting-balance support: Single extremity supported, Feet supported Sitting balance-Leahy Scale: Fair Sitting balance - Comments: seated EOB Postural control: Posterior lean Standing balance support: Bilateral upper extremity supported, During functional activity, Reliant on assistive device for balance Standing balance-Leahy Scale: Fair Standing balance comment: initially retropulsive upon standing, shuffly steps especially when changing directions                           ADL either performed or assessed with clinical judgement   ADL Overall ADL's : Needs assistance/impaired     Grooming: Contact guard assist;Standing           Upper Body Dressing : Moderate assistance;Sitting Upper Body Dressing Details (indicate cue type and reason): donning LSO brace Lower Body Dressing: Maximal assistance;Cueing for sequencing;Cueing for back precautions;Cueing for compensatory techniques;With adaptive  equipment;Sitting/lateral  leans;Sit to/from stand Lower Body Dressing Details (indicate cue type and reason): attempted to use adaptive equipment for LB dressing, pt with poor problem solving/processing; donned underwear & jeans via modified hip hike technique, assist to initiate threading LE's through pants holes Toilet Transfer: Supervision/safety;Ambulation;Regular Toilet;Rolling walker (2 wheels);Grab bars           Functional mobility during ADLs: Contact guard assist;Rolling walker (2 wheels)       Vision Baseline Vision/History: 1 Wears glasses Ability to See in Adequate Light: 0 Adequate Patient Visual Report: No change from baseline       Perception         Praxis         Pertinent Vitals/Pain Pain Assessment Pain Assessment: 0-10 Pain Score: 2  Pain Location: back Pain Descriptors / Indicators: Discomfort Pain Intervention(s): Limited activity within patient's tolerance, Monitored during session, Repositioned     Extremity/Trunk Assessment Upper Extremity Assessment Upper Extremity Assessment: Generalized weakness   Lower Extremity Assessment Lower Extremity Assessment: Defer to PT evaluation   Cervical / Trunk Assessment Cervical / Trunk Assessment: Back Surgery   Communication Communication Communication: No apparent difficulties   Cognition Arousal: Alert Behavior During Therapy: Flat affect Cognition: Cognition impaired     Awareness: Intellectual awareness impaired Memory impairment (select all impairments): Working memory Attention impairment (select first level of impairment): Selective attention Executive functioning impairment (select all impairments): Initiation, Organization, Sequencing, Problem solving OT - Cognition Comments: poor problem solving; participatory                 Following commands: Impaired Following commands impaired: Follows multi-step commands with increased time     Cueing  General Comments   Cueing  Techniques: Verbal cues;Gestural cues;Tactile cues  post-op dressing in place   Exercises     Shoulder Instructions      Home Living Family/patient expects to be discharged to:: Private residence Living Arrangements: Spouse/significant other;Children (daughter) Available Help at Discharge: Family;Available 24 hours/day;Available PRN/intermittently (daughter (PRN assist), wife - near 31 hr assist) Type of Home: House Home Access: Stairs to enter [employer] of Steps: 4 STE Entrance Stairs-Rails:  (unilateral HR) Home Layout: Two level;Full bath on main level;Able to live on main level with bedroom/bathroom Alternate Level Stairs-Number of Steps: 7+7   Bathroom Shower/Tub: Walk-in shower;Tub/shower unit (shower stall & tub/shower unit on main level)   Bathroom Toilet: Standard     Home Equipment: Agricultural consultant (2 wheels);Grab bars - tub/shower          Prior Functioning/Environment Prior Level of Function : Independent/Modified Independent;Driving             Mobility Comments: no AD PTA ADLs Comments: indep    OT Problem List: Decreased strength;Decreased activity tolerance;Impaired balance (sitting and/or standing);Pain;Decreased cognition;Decreased safety awareness;Decreased knowledge of use of DME or AE;Decreased knowledge of precautions   OT Treatment/Interventions: Self-care/ADL training;DME and/or AE instruction;Therapeutic activities;Cognitive remediation/compensation;Balance training;Patient/family education      OT Goals(Current goals can be found in the care plan section)   Acute Rehab OT Goals Patient Stated Goal: to go home OT Goal Formulation: With patient Time For Goal Achievement: 03/05/24 Potential to Achieve Goals: Good   OT Frequency:  Min 1X/week    Co-evaluation              AM-PAC OT 6 Clicks Daily Activity     Outcome Measure Help from another person eating meals?: None Help from another person taking care of  personal grooming?: A Little Help  from another person toileting, which includes using toliet, bedpan, or urinal?: A Little Help from another person bathing (including washing, rinsing, drying)?: A Lot Help from another person to put on and taking off regular upper body clothing?: A Lot Help from another person to put on and taking off regular lower body clothing?: A Lot 6 Click Score: 16   End of Session Equipment Utilized During Treatment: Gait belt;Rolling walker (2 wheels);Back brace Nurse Communication: Mobility status  Activity Tolerance: Patient tolerated treatment well Patient left: in chair;with call bell/phone within reach  OT Visit Diagnosis: Unsteadiness on feet (R26.81);Other abnormalities of gait and mobility (R26.89);Pain Pain - part of body:  (back)                Time: 9253-9169 OT Time Calculation (min): 44 min Charges:  OT General Charges $OT Visit: 1 Visit OT Evaluation $OT Eval Moderate Complexity: 1 Mod OT Treatments $Self Care/Home Management : 23-37 mins  Jarrel Knoke D., MSOT, OTR/L Acute Rehabilitation Services [phone] Secure Chat Preferred  Rikki Milch 02/20/2024, 9:28 AM

## 2024-02-20 NOTE — Anesthesia Postprocedure Evaluation (Signed)
 Anesthesia Post Note  Patient: Drew Andrews  Procedure(s) Performed: POSTERIOR LUMBAR FUSION 1 LEVEL (Back)     Patient location during evaluation: PACU Anesthesia Type: General Level of consciousness: awake and alert Pain management: pain level controlled Vital Signs Assessment: post-procedure vital signs reviewed and stable Respiratory status: spontaneous breathing, nonlabored ventilation, respiratory function stable and patient connected to nasal cannula oxygen Cardiovascular status: blood pressure returned to baseline and stable Postop Assessment: no apparent nausea or vomiting Anesthetic complications: no   No notable events documented.  Last Vitals:  Vitals:   02/20/24 0337 02/20/24 0736  BP: 133/80 110/63  Pulse: 66 (!) 102  Resp: 18 17  Temp: 36.8 C   SpO2: 97% 97%    Last Pain:  Vitals:   02/20/24 0337  TempSrc: Oral  PainSc:                  Shalece Staffa E

## 2024-02-20 NOTE — Evaluation (Signed)
 Physical Therapy Evaluation Patient Details Name: Drew Andrews MRN: [id number] DOB: 1946-04-18 Today's Date: 02/20/2024  History of Present Illness  78 y.o. male presents to [facility] hospital on 02/19/2024 for L4-5 PLIF. PMH includes HTN, GERD, HLD, AVR.  Clinical Impression  Pt presents to PT with deficits in cognition, gait, balance, endurance. Pt demonstrates memory deficits during session, requiring frequent cues to implement back precautions and cues to manage back brace. Pt ambulates for household distances without support of DME during evaluation. PT encourages the pt to utilize his walking sticks or RW for support initially at home to improve stability during the immediate post-op phase. PT will follow during admission but recommends discharge home with HHPT when medically appropriate.        If plan is discharge home, recommend the following: A little help with walking and/or transfers;A little help with bathing/dressing/bathroom;Assistance with cooking/housework;Direct supervision/assist for medications management;Direct supervision/assist for financial management;Assist for transportation;Help with stairs or ramp for entrance;Supervision due to cognitive status   Can travel by private vehicle        Equipment Recommendations None recommended by PT  Recommendations for Other Services       Functional Status Assessment Patient has had a recent decline in their functional status and demonstrates the ability to make significant improvements in function in a reasonable and predictable amount of time.     Precautions / Restrictions Precautions Precautions: Fall;Back Precaution Booklet Issued: Yes (comment) Recall of Precautions/Restrictions: Impaired Required Braces or Orthoses: Spinal Brace Spinal Brace: Lumbar corset;Applied in sitting position Restrictions Weight Bearing Restrictions Per Provider Order: No      Mobility  Bed Mobility Overal bed mobility: Needs  Assistance Bed Mobility: Sit to Sidelying, Rolling Rolling: Contact guard assist       Sit to sidelying: Supervision      Transfers Overall transfer level: Needs assistance Equipment used: None Transfers: Sit to/from Stand Sit to Stand: Supervision                Ambulation/Gait Ambulation/Gait assistance: Supervision Gait Distance (Feet): 200 Feet Assistive device: None Gait Pattern/deviations: Step-through pattern, Decreased dorsiflexion - right, Decreased dorsiflexion - left, Festinating Gait velocity: reduced Gait velocity interpretation: 1.31 - 2.62 ft/sec, indicative of limited community ambulator   General Gait Details: pt with shortened stride length and reduced foot clearance bilaterally. PT notes mild festination when approaching change in flooring at building connection in 3C hallway  Stairs Stairs: Yes Stairs assistance: Supervision Stair Management: One rail Right, Step to pattern Number of Stairs: 7    Wheelchair Mobility     Tilt Bed    Modified Rankin (Stroke Patients Only)       Balance Overall balance assessment: Needs assistance Sitting-balance support: No upper extremity supported, Feet supported Sitting balance-Leahy Scale: Good     Standing balance support: No upper extremity supported, During functional activity Standing balance-Leahy Scale: Fair                               Pertinent Vitals/Pain Pain Assessment Pain Assessment: Faces Faces Pain Scale: Hurts a little bit Pain Location: back Pain Descriptors / Indicators: Sore Pain Intervention(s): Monitored during session    Home Living Family/patient expects to be discharged to:: Private residence Living Arrangements: Spouse/significant other;Children (daughter) Available Help at Discharge: Family;Available 24 hours/day;Available PRN/intermittently (daughter (PRN assist), wife - near 38 hr assist) Type of Home: House Home Access: Stairs to enter Entrance  Stairs-Rails:  (  unilateral HR) Entrance Stairs-Number of Steps: 4 STE Alternate Level Stairs-Number of Steps: 7+7 Home Layout: Two level;Full bath on main level;Able to live on main level with bedroom/bathroom Home Equipment: Rolling Walker (2 wheels);Grab bars - tub/shower      Prior Function Prior Level of Function : Independent/Modified Independent;Driving             Mobility Comments: no AD PTA ADLs Comments: indep     Extremity/Trunk Assessment   Upper Extremity Assessment Upper Extremity Assessment: Generalized weakness    Lower Extremity Assessment Lower Extremity Assessment: Defer to PT evaluation    Cervical / Trunk Assessment Cervical / Trunk Assessment: Back Surgery  Communication   Communication Communication: No apparent difficulties    Cognition Arousal: Alert Behavior During Therapy: WFL for tasks assessed/performed   PT - Cognitive impairments: Memory                       PT - Cognition Comments: poor recall of back precautions during mobility, is able to verbalize 3/3 when asked to recall Following commands: Impaired Following commands impaired: Follows multi-step commands inconsistently     Cueing Cueing Techniques: Verbal cues     General Comments General comments (skin integrity, edema, etc.): post-op dressing in place    Exercises     Assessment/Plan    PT Assessment Patient needs continued PT services  PT Problem List Decreased strength;Decreased activity tolerance;Decreased balance;Decreased mobility;Decreased knowledge of use of DME;Decreased safety awareness;Decreased knowledge of precautions       PT Treatment Interventions DME instruction;Gait training;Stair training;Functional mobility training;Therapeutic activities;Therapeutic exercise;Balance training;Neuromuscular re-education;Patient/family education    PT Goals (Current goals can be found in the Care Plan section)  Acute Rehab PT Goals Patient Stated Goal:  to return home PT Goal Formulation: With patient Time For Goal Achievement: 02/24/24 Potential to Achieve Goals: Good    Frequency Min 3X/week     Co-evaluation               AM-PAC PT 6 Clicks Mobility  Outcome Measure Help needed turning from your back to your side while in a flat bed without using bedrails?: A Little Help needed moving from lying on your back to sitting on the side of a flat bed without using bedrails?: A Little Help needed moving to and from a bed to a chair (including a wheelchair)?: A Little Help needed standing up from a chair using your arms (e.g., wheelchair or bedside chair)?: A Little Help needed to walk in hospital room?: A Little Help needed climbing 3-5 steps with a railing? : A Little 6 Click Score: 18    End of Session Equipment Utilized During Treatment: Back brace;Gait belt Activity Tolerance: Patient tolerated treatment well Patient left: in bed;with call bell/phone within reach;with bed alarm set Nurse Communication: Mobility status PT Visit Diagnosis: Other abnormalities of gait and mobility (R26.89);Muscle weakness (generalized) (M62.81);Pain    Time: 0832-0908 PT Time Calculation (min) (ACUTE ONLY): 36 min   Charges:   PT Evaluation $PT Eval Low Complexity: 1 Low   PT General Charges $$ ACUTE PT VISIT: 1 Visit         Bernardino JINNY Ruth, PT, DPT Acute Rehabilitation Office [phone]   Bernardino JINNY Ruth 02/20/2024, 11:50 AM

## 2024-02-20 NOTE — TOC Transition Note (Signed)
 Transition of Care [facility]) - Discharge Note   Patient Details  Name: Drew Andrews MRN: [id number] Date of Birth: [birth year]  [facility] of [facility]) CM/SW Contact:  Andrez JULIANNA George, RN Phone Number: 02/20/2024, 10:06 AM   Clinical Narrative:     Pt  is discharging home with home health through [facility]. Information on the AVS. [facility] will contact him for the first home visit.  Pt has transportation home.  Final next level of care: Home w Home Health Services Barriers to Discharge: No Barriers Identified   Patient Goals and CMS Choice   CMS Medicare.gov Compare Post Acute Care list provided to:: Patient Choice offered to / list presented to : Patient      Discharge Placement                       Discharge Plan and Services Additional resources added to the After Visit Summary for                            [facility] Arranged: PT, OT [facility] Agency: [facility] Date [facility] Agency Contacted: 02/20/24   Representative spoke with at [facility] Agency: [place]  Social Drivers of Health (SDOH) Interventions SDOH Screenings   Tobacco Use: Medium Risk (02/19/2024)     Readmission Risk Interventions     No data to display

## 2024-02-20 NOTE — Progress Notes (Signed)
 Physical Therapy Treatment  Patient Details Name: Drew Andrews MRN: [id number] DOB: June 29, 1945 Today's Date: 02/20/2024   History of Present Illness 78 y.o. male presents to [facility] hospital on 02/19/2024 for L4-5 PLIF. PMH includes HTN, GERD, HLD, AVR.    PT Comments  PT asked to return to room for education on bed mobility and general safety with pt and wife. Reviewed log roll, donning/doffing brace, and general safety with home management. Provided pt with urinal to decrease nighttime trips to the bathroom and therefore risk for falls, as wife will be staying upstairs and pt will be downstairs. Also provided wife with a gait belt to assist pt in/out of the car and up the stairs to enter home. Pt anticipates d/c home today.    If plan is discharge home, recommend the following: A little help with walking and/or transfers;A little help with bathing/dressing/bathroom;Assistance with cooking/housework;Direct supervision/assist for medications management;Direct supervision/assist for financial management;Assist for transportation;Help with stairs or ramp for entrance;Supervision due to cognitive status   Can travel by private vehicle        Equipment Recommendations  None recommended by PT    Recommendations for Other Services       Precautions / Restrictions Precautions Precautions: Fall;Back Precaution Booklet Issued: Yes (comment) Recall of Precautions/Restrictions: Impaired Precaution/Restrictions Comments: frequent cues to maintain throughout session Required Braces or Orthoses: Spinal Brace Spinal Brace: Lumbar corset;Applied in sitting position Restrictions Weight Bearing Restrictions Per Provider Order: No     Mobility  Bed Mobility Overal bed mobility: Needs Assistance Bed Mobility: Rolling, Sidelying to Sit, Sit to Sidelying Rolling: Min assist Sidelying to sit: Min assist     Sit to sidelying: Min assist General bed mobility comments: Set up for  entering/exiting bed like he would have to do at home. Min assist required for optimal log roll technique and LE elevation back into bed.    Transfers Overall transfer level: Needs assistance Equipment used: Rolling walker (2 wheels) Transfers: Sit to/from Stand Sit to Stand: Contact guard assist           General transfer comment: Good hand placement on seated surface for safety. No LOB.    Ambulation/Gait                   Stairs             Wheelchair Mobility     Tilt Bed    Modified Rankin (Stroke Patients Only)       Balance                                            Communication Communication Communication: No apparent difficulties  Cognition Arousal: Alert Behavior During Therapy: Flat affect   PT - Cognitive impairments: Memory                         Following commands: Impaired Following commands impaired: Follows multi-step commands with increased time    Cueing Cueing Techniques: Verbal cues, Gestural cues, Tactile cues  Exercises      General Comments        Pertinent Vitals/Pain Pain Assessment Pain Assessment: Faces Faces Pain Scale: Hurts little more Pain Location: legs Pain Descriptors / Indicators: Discomfort Pain Intervention(s): Limited activity within patient's tolerance, Monitored during session, Repositioned    Home Living  Prior Function            PT Goals (current goals can now be found in the care plan section) Acute Rehab PT Goals PT Goal Formulation: With patient Time For Goal Achievement: 02/24/24 Potential to Achieve Goals: Good Progress towards PT goals: Progressing toward goals    Frequency    Min 5X/week      PT Plan      Co-evaluation              AM-PAC PT 6 Clicks Mobility   Outcome Measure  Help needed turning from your back to your side while in a flat bed without using bedrails?: A Little Help needed  moving from lying on your back to sitting on the side of a flat bed without using bedrails?: A Little Help needed moving to and from a bed to a chair (including a wheelchair)?: A Little Help needed standing up from a chair using your arms (e.g., wheelchair or bedside chair)?: A Little Help needed to walk in hospital room?: A Little Help needed climbing 3-5 steps with a railing? : A Little 6 Click Score: 18    End of Session Equipment Utilized During Treatment: Back brace (gait belt provided for wife) Activity Tolerance: Patient tolerated treatment well Patient left: in bed;with call bell/phone within reach;with family/visitor present Nurse Communication: Mobility status PT Visit Diagnosis: Unsteadiness on feet (R26.81);Pain Pain - part of body:  (back, legs)     Time: 8961-8947 PT Time Calculation (min) (ACUTE ONLY): 14 min  Charges:    $Therapeutic Activity: 8-22 mins PT General Charges $$ ACUTE PT VISIT: 1 Visit                     Leita Sable, PT, DPT Acute Rehabilitation Services Secure Chat Preferred Office: [phone]    Leita JONETTA Sable 02/20/2024, 1:45 PM

## 2024-02-20 NOTE — Plan of Care (Signed)
 Pt and wife given D/C instructions with verbal understanding. Rx's were sent to the pharmacy by MD. Pt's incision is clean and dry with no sign of infection. Pt's IV was removed prior to D/C. Pt D/C'd home via wheelchair per MD order. Home Health was arranged prior to D/C. Pt is stable @ D/C and has no other needs at this time. Rosina Rakers, RN

## 2024-02-20 NOTE — Care Management Obs Status (Signed)
 MEDICARE OBSERVATION STATUS NOTIFICATION   Patient Details  Name: Kyel Purk MRN: [id number] Date of Birth: [birth year]   Medicare Observation Status Notification Given:  Yes    Andrez JULIANNA George, RN 02/20/2024, 10:12 AM

## 2024-02-21 ENCOUNTER — Ambulatory Visit

## 2024-02-21 DIAGNOSIS — G709 Myoneural disorder, unspecified: Secondary | ICD-10-CM | POA: Diagnosis not present

## 2024-02-21 DIAGNOSIS — Z8701 Personal history of pneumonia (recurrent): Secondary | ICD-10-CM | POA: Diagnosis not present

## 2024-02-21 DIAGNOSIS — M1990 Unspecified osteoarthritis, unspecified site: Secondary | ICD-10-CM | POA: Diagnosis not present

## 2024-02-21 DIAGNOSIS — I77810 Thoracic aortic ectasia: Secondary | ICD-10-CM | POA: Diagnosis not present

## 2024-02-21 DIAGNOSIS — Z952 Presence of prosthetic heart valve: Secondary | ICD-10-CM | POA: Diagnosis not present

## 2024-02-21 DIAGNOSIS — E785 Hyperlipidemia, unspecified: Secondary | ICD-10-CM | POA: Diagnosis not present

## 2024-02-21 DIAGNOSIS — Z9089 Acquired absence of other organs: Secondary | ICD-10-CM | POA: Diagnosis not present

## 2024-02-21 DIAGNOSIS — Z7982 Long term (current) use of aspirin: Secondary | ICD-10-CM | POA: Diagnosis not present

## 2024-02-21 DIAGNOSIS — M48061 Spinal stenosis, lumbar region without neurogenic claudication: Secondary | ICD-10-CM | POA: Diagnosis not present

## 2024-02-21 DIAGNOSIS — Z556 Problems related to health literacy: Secondary | ICD-10-CM | POA: Diagnosis not present

## 2024-02-21 DIAGNOSIS — K219 Gastro-esophageal reflux disease without esophagitis: Secondary | ICD-10-CM | POA: Diagnosis not present

## 2024-02-21 DIAGNOSIS — M4316 Spondylolisthesis, lumbar region: Secondary | ICD-10-CM | POA: Diagnosis not present

## 2024-02-21 DIAGNOSIS — F419 Anxiety disorder, unspecified: Secondary | ICD-10-CM | POA: Diagnosis not present

## 2024-02-21 DIAGNOSIS — G20A1 Parkinson's disease without dyskinesia, without mention of fluctuations: Secondary | ICD-10-CM | POA: Diagnosis not present

## 2024-02-21 DIAGNOSIS — I5030 Unspecified diastolic (congestive) heart failure: Secondary | ICD-10-CM | POA: Diagnosis not present

## 2024-02-21 DIAGNOSIS — R32 Unspecified urinary incontinence: Secondary | ICD-10-CM | POA: Diagnosis not present

## 2024-02-21 DIAGNOSIS — F32A Depression, unspecified: Secondary | ICD-10-CM | POA: Diagnosis not present

## 2024-02-21 DIAGNOSIS — I110 Hypertensive heart disease with heart failure: Secondary | ICD-10-CM | POA: Diagnosis not present

## 2024-02-21 DIAGNOSIS — Z79891 Long term (current) use of opiate analgesic: Secondary | ICD-10-CM | POA: Diagnosis not present

## 2024-02-21 DIAGNOSIS — Z981 Arthrodesis status: Secondary | ICD-10-CM | POA: Diagnosis not present

## 2024-02-21 DIAGNOSIS — I2510 Atherosclerotic heart disease of native coronary artery without angina pectoris: Secondary | ICD-10-CM | POA: Diagnosis not present

## 2024-02-21 DIAGNOSIS — Z87891 Personal history of nicotine dependence: Secondary | ICD-10-CM | POA: Diagnosis not present

## 2024-02-26 ENCOUNTER — Ambulatory Visit

## 2024-02-28 ENCOUNTER — Ambulatory Visit

## 2024-02-29 DIAGNOSIS — I5030 Unspecified diastolic (congestive) heart failure: Secondary | ICD-10-CM | POA: Diagnosis not present

## 2024-03-01 ENCOUNTER — Telehealth: Payer: Self-pay | Admitting: Cardiovascular Disease

## 2024-03-01 DIAGNOSIS — I77810 Thoracic aortic ectasia: Secondary | ICD-10-CM | POA: Diagnosis not present

## 2024-03-01 DIAGNOSIS — Z952 Presence of prosthetic heart valve: Secondary | ICD-10-CM | POA: Diagnosis not present

## 2024-03-01 DIAGNOSIS — M48061 Spinal stenosis, lumbar region without neurogenic claudication: Secondary | ICD-10-CM | POA: Diagnosis not present

## 2024-03-01 DIAGNOSIS — I5030 Unspecified diastolic (congestive) heart failure: Secondary | ICD-10-CM | POA: Diagnosis not present

## 2024-03-01 DIAGNOSIS — M1990 Unspecified osteoarthritis, unspecified site: Secondary | ICD-10-CM | POA: Diagnosis not present

## 2024-03-01 DIAGNOSIS — Z981 Arthrodesis status: Secondary | ICD-10-CM | POA: Diagnosis not present

## 2024-03-01 NOTE — Telephone Encounter (Signed)
 Pt c/o BP issue: STAT if pt c/o blurred vision, one-sided weakness or slurred speech.   1. What is your BP concern?  Lorie not currently with patient. Report is from this morning. PT says BP was low this morning. She advised patient to drink water and EMS was called. When they arrived BP was up to 110/80 and 130/80 by the time PT was leaving patient.  2. Have you taken any BP medication today? Yes   3. What are your last 5 BP readings?  This morning:  -80/60 right arm -70/50 left arm *advised patient to take water* -88/63 shortly after -110/80 when EMS arrived -Up to 130/80 when PT was leaving patient  4. Are you having any other symptoms (ex. Dizziness, headache, blurred vision, passed out)?  No complaints of symptoms aside from occasional weakness. Denies dizziness, but says he has some occasionally--per PT

## 2024-03-04 DIAGNOSIS — R32 Unspecified urinary incontinence: Secondary | ICD-10-CM | POA: Diagnosis not present

## 2024-03-04 DIAGNOSIS — I2510 Atherosclerotic heart disease of native coronary artery without angina pectoris: Secondary | ICD-10-CM | POA: Diagnosis not present

## 2024-03-04 DIAGNOSIS — M1990 Unspecified osteoarthritis, unspecified site: Secondary | ICD-10-CM | POA: Diagnosis not present

## 2024-03-04 DIAGNOSIS — I110 Hypertensive heart disease with heart failure: Secondary | ICD-10-CM | POA: Diagnosis not present

## 2024-03-04 DIAGNOSIS — Z981 Arthrodesis status: Secondary | ICD-10-CM | POA: Diagnosis not present

## 2024-03-04 DIAGNOSIS — Z79891 Long term (current) use of opiate analgesic: Secondary | ICD-10-CM | POA: Diagnosis not present

## 2024-03-04 DIAGNOSIS — F419 Anxiety disorder, unspecified: Secondary | ICD-10-CM | POA: Diagnosis not present

## 2024-03-04 DIAGNOSIS — Z952 Presence of prosthetic heart valve: Secondary | ICD-10-CM | POA: Diagnosis not present

## 2024-03-04 DIAGNOSIS — M4316 Spondylolisthesis, lumbar region: Secondary | ICD-10-CM | POA: Diagnosis not present

## 2024-03-04 DIAGNOSIS — Z8701 Personal history of pneumonia (recurrent): Secondary | ICD-10-CM | POA: Diagnosis not present

## 2024-03-04 DIAGNOSIS — G20A1 Parkinson's disease without dyskinesia, without mention of fluctuations: Secondary | ICD-10-CM | POA: Diagnosis not present

## 2024-03-04 DIAGNOSIS — E785 Hyperlipidemia, unspecified: Secondary | ICD-10-CM | POA: Diagnosis not present

## 2024-03-04 DIAGNOSIS — Z9089 Acquired absence of other organs: Secondary | ICD-10-CM | POA: Diagnosis not present

## 2024-03-04 DIAGNOSIS — Z7982 Long term (current) use of aspirin: Secondary | ICD-10-CM | POA: Diagnosis not present

## 2024-03-04 DIAGNOSIS — F32A Depression, unspecified: Secondary | ICD-10-CM | POA: Diagnosis not present

## 2024-03-04 DIAGNOSIS — G709 Myoneural disorder, unspecified: Secondary | ICD-10-CM | POA: Diagnosis not present

## 2024-03-04 DIAGNOSIS — I5030 Unspecified diastolic (congestive) heart failure: Secondary | ICD-10-CM | POA: Diagnosis not present

## 2024-03-04 DIAGNOSIS — I77810 Thoracic aortic ectasia: Secondary | ICD-10-CM | POA: Diagnosis not present

## 2024-03-04 DIAGNOSIS — M48061 Spinal stenosis, lumbar region without neurogenic claudication: Secondary | ICD-10-CM | POA: Diagnosis not present

## 2024-03-04 DIAGNOSIS — Z87891 Personal history of nicotine dependence: Secondary | ICD-10-CM | POA: Diagnosis not present

## 2024-03-04 DIAGNOSIS — K219 Gastro-esophageal reflux disease without esophagitis: Secondary | ICD-10-CM | POA: Diagnosis not present

## 2024-03-04 DIAGNOSIS — Z556 Problems related to health literacy: Secondary | ICD-10-CM | POA: Diagnosis not present

## 2024-03-04 DIAGNOSIS — Z4789 Encounter for other orthopedic aftercare: Secondary | ICD-10-CM | POA: Diagnosis not present

## 2024-03-04 NOTE — Telephone Encounter (Signed)
 Spoke with pt who reports he did not have EMS transport to the ED.  Pt is keeping a log of pressures.  Offered to schedule APP appointment and pt declines.  He states everything is going pretty well now. Pt advised will forward to make Dr Barbaraann aware.  Pt verbalizes understanding and thanked [employer] for the call.  10/31 - 107/68 HR 62 11/1 - 115/75 HR 70 11/2 - 117/84 HR 70 11/3 128/83 HR 65

## 2024-03-06 DIAGNOSIS — E785 Hyperlipidemia, unspecified: Secondary | ICD-10-CM | POA: Diagnosis not present

## 2024-03-06 DIAGNOSIS — Z952 Presence of prosthetic heart valve: Secondary | ICD-10-CM | POA: Diagnosis not present

## 2024-03-06 DIAGNOSIS — Z4789 Encounter for other orthopedic aftercare: Secondary | ICD-10-CM | POA: Diagnosis not present

## 2024-03-06 DIAGNOSIS — F32A Depression, unspecified: Secondary | ICD-10-CM | POA: Diagnosis not present

## 2024-03-06 DIAGNOSIS — K219 Gastro-esophageal reflux disease without esophagitis: Secondary | ICD-10-CM | POA: Diagnosis not present

## 2024-03-06 DIAGNOSIS — Z981 Arthrodesis status: Secondary | ICD-10-CM | POA: Diagnosis not present

## 2024-03-06 DIAGNOSIS — I77810 Thoracic aortic ectasia: Secondary | ICD-10-CM | POA: Diagnosis not present

## 2024-03-06 DIAGNOSIS — R32 Unspecified urinary incontinence: Secondary | ICD-10-CM | POA: Diagnosis not present

## 2024-03-06 DIAGNOSIS — Z87891 Personal history of nicotine dependence: Secondary | ICD-10-CM | POA: Diagnosis not present

## 2024-03-06 DIAGNOSIS — G20A1 Parkinson's disease without dyskinesia, without mention of fluctuations: Secondary | ICD-10-CM | POA: Diagnosis not present

## 2024-03-06 DIAGNOSIS — M4316 Spondylolisthesis, lumbar region: Secondary | ICD-10-CM | POA: Diagnosis not present

## 2024-03-06 DIAGNOSIS — Z9089 Acquired absence of other organs: Secondary | ICD-10-CM | POA: Diagnosis not present

## 2024-03-06 DIAGNOSIS — I5030 Unspecified diastolic (congestive) heart failure: Secondary | ICD-10-CM | POA: Diagnosis not present

## 2024-03-06 DIAGNOSIS — Z8701 Personal history of pneumonia (recurrent): Secondary | ICD-10-CM | POA: Diagnosis not present

## 2024-03-06 DIAGNOSIS — I110 Hypertensive heart disease with heart failure: Secondary | ICD-10-CM | POA: Diagnosis not present

## 2024-03-06 DIAGNOSIS — I2510 Atherosclerotic heart disease of native coronary artery without angina pectoris: Secondary | ICD-10-CM | POA: Diagnosis not present

## 2024-03-06 DIAGNOSIS — Z7982 Long term (current) use of aspirin: Secondary | ICD-10-CM | POA: Diagnosis not present

## 2024-03-06 DIAGNOSIS — G709 Myoneural disorder, unspecified: Secondary | ICD-10-CM | POA: Diagnosis not present

## 2024-03-06 DIAGNOSIS — Z79891 Long term (current) use of opiate analgesic: Secondary | ICD-10-CM | POA: Diagnosis not present

## 2024-03-06 DIAGNOSIS — F419 Anxiety disorder, unspecified: Secondary | ICD-10-CM | POA: Diagnosis not present

## 2024-03-06 DIAGNOSIS — M1990 Unspecified osteoarthritis, unspecified site: Secondary | ICD-10-CM | POA: Diagnosis not present

## 2024-03-06 DIAGNOSIS — M48061 Spinal stenosis, lumbar region without neurogenic claudication: Secondary | ICD-10-CM | POA: Diagnosis not present

## 2024-03-06 DIAGNOSIS — Z556 Problems related to health literacy: Secondary | ICD-10-CM | POA: Diagnosis not present

## 2024-03-11 DIAGNOSIS — M48061 Spinal stenosis, lumbar region without neurogenic claudication: Secondary | ICD-10-CM | POA: Diagnosis not present

## 2024-03-11 DIAGNOSIS — R32 Unspecified urinary incontinence: Secondary | ICD-10-CM | POA: Diagnosis not present

## 2024-03-11 DIAGNOSIS — Z7982 Long term (current) use of aspirin: Secondary | ICD-10-CM | POA: Diagnosis not present

## 2024-03-11 DIAGNOSIS — Z556 Problems related to health literacy: Secondary | ICD-10-CM | POA: Diagnosis not present

## 2024-03-11 DIAGNOSIS — Z8701 Personal history of pneumonia (recurrent): Secondary | ICD-10-CM | POA: Diagnosis not present

## 2024-03-11 DIAGNOSIS — E785 Hyperlipidemia, unspecified: Secondary | ICD-10-CM | POA: Diagnosis not present

## 2024-03-11 DIAGNOSIS — G709 Myoneural disorder, unspecified: Secondary | ICD-10-CM | POA: Diagnosis not present

## 2024-03-11 DIAGNOSIS — Z981 Arthrodesis status: Secondary | ICD-10-CM | POA: Diagnosis not present

## 2024-03-11 DIAGNOSIS — M1990 Unspecified osteoarthritis, unspecified site: Secondary | ICD-10-CM | POA: Diagnosis not present

## 2024-03-11 DIAGNOSIS — Z4789 Encounter for other orthopedic aftercare: Secondary | ICD-10-CM | POA: Diagnosis not present

## 2024-03-11 DIAGNOSIS — I5030 Unspecified diastolic (congestive) heart failure: Secondary | ICD-10-CM | POA: Diagnosis not present

## 2024-03-11 DIAGNOSIS — K219 Gastro-esophageal reflux disease without esophagitis: Secondary | ICD-10-CM | POA: Diagnosis not present

## 2024-03-11 DIAGNOSIS — Z87891 Personal history of nicotine dependence: Secondary | ICD-10-CM | POA: Diagnosis not present

## 2024-03-11 DIAGNOSIS — I77810 Thoracic aortic ectasia: Secondary | ICD-10-CM | POA: Diagnosis not present

## 2024-03-11 DIAGNOSIS — Z79891 Long term (current) use of opiate analgesic: Secondary | ICD-10-CM | POA: Diagnosis not present

## 2024-03-11 DIAGNOSIS — Z9089 Acquired absence of other organs: Secondary | ICD-10-CM | POA: Diagnosis not present

## 2024-03-11 DIAGNOSIS — M4316 Spondylolisthesis, lumbar region: Secondary | ICD-10-CM | POA: Diagnosis not present

## 2024-03-11 DIAGNOSIS — F32A Depression, unspecified: Secondary | ICD-10-CM | POA: Diagnosis not present

## 2024-03-11 DIAGNOSIS — G20A1 Parkinson's disease without dyskinesia, without mention of fluctuations: Secondary | ICD-10-CM | POA: Diagnosis not present

## 2024-03-11 DIAGNOSIS — I110 Hypertensive heart disease with heart failure: Secondary | ICD-10-CM | POA: Diagnosis not present

## 2024-03-11 DIAGNOSIS — Z952 Presence of prosthetic heart valve: Secondary | ICD-10-CM | POA: Diagnosis not present

## 2024-03-11 DIAGNOSIS — F419 Anxiety disorder, unspecified: Secondary | ICD-10-CM | POA: Diagnosis not present

## 2024-03-11 DIAGNOSIS — I2510 Atherosclerotic heart disease of native coronary artery without angina pectoris: Secondary | ICD-10-CM | POA: Diagnosis not present

## 2024-03-13 DIAGNOSIS — Z952 Presence of prosthetic heart valve: Secondary | ICD-10-CM | POA: Diagnosis not present

## 2024-03-18 DIAGNOSIS — F419 Anxiety disorder, unspecified: Secondary | ICD-10-CM | POA: Diagnosis not present

## 2024-03-18 DIAGNOSIS — I110 Hypertensive heart disease with heart failure: Secondary | ICD-10-CM | POA: Diagnosis not present

## 2024-03-18 DIAGNOSIS — Z981 Arthrodesis status: Secondary | ICD-10-CM | POA: Diagnosis not present

## 2024-03-18 DIAGNOSIS — Z4789 Encounter for other orthopedic aftercare: Secondary | ICD-10-CM | POA: Diagnosis not present

## 2024-03-18 DIAGNOSIS — M48061 Spinal stenosis, lumbar region without neurogenic claudication: Secondary | ICD-10-CM | POA: Diagnosis not present

## 2024-03-18 DIAGNOSIS — I2510 Atherosclerotic heart disease of native coronary artery without angina pectoris: Secondary | ICD-10-CM | POA: Diagnosis not present

## 2024-03-18 DIAGNOSIS — G709 Myoneural disorder, unspecified: Secondary | ICD-10-CM | POA: Diagnosis not present

## 2024-03-18 DIAGNOSIS — M1990 Unspecified osteoarthritis, unspecified site: Secondary | ICD-10-CM | POA: Diagnosis not present

## 2024-03-18 DIAGNOSIS — I77810 Thoracic aortic ectasia: Secondary | ICD-10-CM | POA: Diagnosis not present

## 2024-03-18 DIAGNOSIS — F32A Depression, unspecified: Secondary | ICD-10-CM | POA: Diagnosis not present

## 2024-03-18 DIAGNOSIS — M4316 Spondylolisthesis, lumbar region: Secondary | ICD-10-CM | POA: Diagnosis not present

## 2024-03-18 DIAGNOSIS — I5030 Unspecified diastolic (congestive) heart failure: Secondary | ICD-10-CM | POA: Diagnosis not present

## 2024-03-20 DIAGNOSIS — Z952 Presence of prosthetic heart valve: Secondary | ICD-10-CM | POA: Diagnosis not present

## 2024-03-26 DIAGNOSIS — I110 Hypertensive heart disease with heart failure: Secondary | ICD-10-CM | POA: Diagnosis not present

## 2024-03-26 DIAGNOSIS — F419 Anxiety disorder, unspecified: Secondary | ICD-10-CM | POA: Diagnosis not present

## 2024-03-26 DIAGNOSIS — R32 Unspecified urinary incontinence: Secondary | ICD-10-CM | POA: Diagnosis not present

## 2024-03-26 DIAGNOSIS — M4316 Spondylolisthesis, lumbar region: Secondary | ICD-10-CM | POA: Diagnosis not present

## 2024-03-26 DIAGNOSIS — I5030 Unspecified diastolic (congestive) heart failure: Secondary | ICD-10-CM | POA: Diagnosis not present

## 2024-03-26 DIAGNOSIS — Z8701 Personal history of pneumonia (recurrent): Secondary | ICD-10-CM | POA: Diagnosis not present

## 2024-03-26 DIAGNOSIS — M48061 Spinal stenosis, lumbar region without neurogenic claudication: Secondary | ICD-10-CM | POA: Diagnosis not present

## 2024-03-26 DIAGNOSIS — Z9089 Acquired absence of other organs: Secondary | ICD-10-CM | POA: Diagnosis not present

## 2024-03-26 DIAGNOSIS — Z4789 Encounter for other orthopedic aftercare: Secondary | ICD-10-CM | POA: Diagnosis not present

## 2024-03-26 DIAGNOSIS — Z556 Problems related to health literacy: Secondary | ICD-10-CM | POA: Diagnosis not present

## 2024-03-26 DIAGNOSIS — G709 Myoneural disorder, unspecified: Secondary | ICD-10-CM | POA: Diagnosis not present

## 2024-03-26 DIAGNOSIS — Z7982 Long term (current) use of aspirin: Secondary | ICD-10-CM | POA: Diagnosis not present

## 2024-03-26 DIAGNOSIS — Z952 Presence of prosthetic heart valve: Secondary | ICD-10-CM | POA: Diagnosis not present

## 2024-03-26 DIAGNOSIS — F32A Depression, unspecified: Secondary | ICD-10-CM | POA: Diagnosis not present

## 2024-03-26 DIAGNOSIS — K219 Gastro-esophageal reflux disease without esophagitis: Secondary | ICD-10-CM | POA: Diagnosis not present

## 2024-03-26 DIAGNOSIS — I2510 Atherosclerotic heart disease of native coronary artery without angina pectoris: Secondary | ICD-10-CM | POA: Diagnosis not present

## 2024-03-26 DIAGNOSIS — Z981 Arthrodesis status: Secondary | ICD-10-CM | POA: Diagnosis not present

## 2024-03-26 DIAGNOSIS — E785 Hyperlipidemia, unspecified: Secondary | ICD-10-CM | POA: Diagnosis not present

## 2024-03-26 DIAGNOSIS — I77810 Thoracic aortic ectasia: Secondary | ICD-10-CM | POA: Diagnosis not present

## 2024-03-26 DIAGNOSIS — Z87891 Personal history of nicotine dependence: Secondary | ICD-10-CM | POA: Diagnosis not present

## 2024-03-26 DIAGNOSIS — Z79891 Long term (current) use of opiate analgesic: Secondary | ICD-10-CM | POA: Diagnosis not present

## 2024-03-26 DIAGNOSIS — G20A1 Parkinson's disease without dyskinesia, without mention of fluctuations: Secondary | ICD-10-CM | POA: Diagnosis not present

## 2024-03-26 DIAGNOSIS — M1990 Unspecified osteoarthritis, unspecified site: Secondary | ICD-10-CM | POA: Diagnosis not present

## 2024-04-02 DIAGNOSIS — M1990 Unspecified osteoarthritis, unspecified site: Secondary | ICD-10-CM | POA: Diagnosis not present

## 2024-04-02 DIAGNOSIS — Z981 Arthrodesis status: Secondary | ICD-10-CM | POA: Diagnosis not present

## 2024-04-02 DIAGNOSIS — G20A1 Parkinson's disease without dyskinesia, without mention of fluctuations: Secondary | ICD-10-CM | POA: Diagnosis not present

## 2024-04-02 DIAGNOSIS — I5030 Unspecified diastolic (congestive) heart failure: Secondary | ICD-10-CM | POA: Diagnosis not present

## 2024-04-02 DIAGNOSIS — Z952 Presence of prosthetic heart valve: Secondary | ICD-10-CM | POA: Diagnosis not present

## 2024-04-02 DIAGNOSIS — M4316 Spondylolisthesis, lumbar region: Secondary | ICD-10-CM | POA: Diagnosis not present

## 2024-04-02 DIAGNOSIS — F32A Depression, unspecified: Secondary | ICD-10-CM | POA: Diagnosis not present

## 2024-04-02 DIAGNOSIS — Z9089 Acquired absence of other organs: Secondary | ICD-10-CM | POA: Diagnosis not present

## 2024-04-02 DIAGNOSIS — E785 Hyperlipidemia, unspecified: Secondary | ICD-10-CM | POA: Diagnosis not present

## 2024-04-02 DIAGNOSIS — I110 Hypertensive heart disease with heart failure: Secondary | ICD-10-CM | POA: Diagnosis not present

## 2024-04-02 DIAGNOSIS — Z7982 Long term (current) use of aspirin: Secondary | ICD-10-CM | POA: Diagnosis not present

## 2024-04-02 DIAGNOSIS — G709 Myoneural disorder, unspecified: Secondary | ICD-10-CM | POA: Diagnosis not present

## 2024-04-02 DIAGNOSIS — Z87891 Personal history of nicotine dependence: Secondary | ICD-10-CM | POA: Diagnosis not present

## 2024-04-02 DIAGNOSIS — F419 Anxiety disorder, unspecified: Secondary | ICD-10-CM | POA: Diagnosis not present

## 2024-04-02 DIAGNOSIS — R32 Unspecified urinary incontinence: Secondary | ICD-10-CM | POA: Diagnosis not present

## 2024-04-02 DIAGNOSIS — Z8701 Personal history of pneumonia (recurrent): Secondary | ICD-10-CM | POA: Diagnosis not present

## 2024-04-02 DIAGNOSIS — K219 Gastro-esophageal reflux disease without esophagitis: Secondary | ICD-10-CM | POA: Diagnosis not present

## 2024-04-02 DIAGNOSIS — Z79891 Long term (current) use of opiate analgesic: Secondary | ICD-10-CM | POA: Diagnosis not present

## 2024-04-02 DIAGNOSIS — I77810 Thoracic aortic ectasia: Secondary | ICD-10-CM | POA: Diagnosis not present

## 2024-04-02 DIAGNOSIS — M48061 Spinal stenosis, lumbar region without neurogenic claudication: Secondary | ICD-10-CM | POA: Diagnosis not present

## 2024-04-02 DIAGNOSIS — Z4789 Encounter for other orthopedic aftercare: Secondary | ICD-10-CM | POA: Diagnosis not present

## 2024-04-02 DIAGNOSIS — I2510 Atherosclerotic heart disease of native coronary artery without angina pectoris: Secondary | ICD-10-CM | POA: Diagnosis not present

## 2024-04-02 DIAGNOSIS — Z556 Problems related to health literacy: Secondary | ICD-10-CM | POA: Diagnosis not present

## 2024-04-11 DIAGNOSIS — Z6826 Body mass index (BMI) 26.0-26.9, adult: Secondary | ICD-10-CM | POA: Diagnosis not present

## 2024-04-11 DIAGNOSIS — M4316 Spondylolisthesis, lumbar region: Secondary | ICD-10-CM | POA: Diagnosis not present

## 2024-05-22 ENCOUNTER — Ambulatory Visit: Admitting: Family Medicine

## 2024-09-03 ENCOUNTER — Ambulatory Visit: Admitting: Family Medicine
